# Patient Record
Sex: Male | Born: 1940
Health system: Southern US, Community
[De-identification: ages and names within clinical notes are randomized; demographics above are authoritative.]

## PROBLEM LIST (undated history)

## (undated) DIAGNOSIS — E039 Hypothyroidism, unspecified: Secondary | ICD-10-CM

## (undated) DIAGNOSIS — K222 Esophageal obstruction: Secondary | ICD-10-CM

## (undated) DIAGNOSIS — R58 Hemorrhage, not elsewhere classified: Secondary | ICD-10-CM

## (undated) DIAGNOSIS — K649 Unspecified hemorrhoids: Secondary | ICD-10-CM

## (undated) DIAGNOSIS — I4510 Unspecified right bundle-branch block: Secondary | ICD-10-CM

## (undated) DIAGNOSIS — F319 Bipolar disorder, unspecified: Secondary | ICD-10-CM

## (undated) DIAGNOSIS — J45909 Unspecified asthma, uncomplicated: Secondary | ICD-10-CM

## (undated) DIAGNOSIS — M47816 Spondylosis without myelopathy or radiculopathy, lumbar region: Secondary | ICD-10-CM

## (undated) DIAGNOSIS — E119 Type 2 diabetes mellitus without complications: Secondary | ICD-10-CM

## (undated) DIAGNOSIS — T7840XA Allergy, unspecified, initial encounter: Secondary | ICD-10-CM

## (undated) DIAGNOSIS — Z86010 Personal history of colonic polyps: Secondary | ICD-10-CM

## (undated) DIAGNOSIS — M169 Osteoarthritis of hip, unspecified: Secondary | ICD-10-CM

## (undated) DIAGNOSIS — Z87442 Personal history of urinary calculi: Secondary | ICD-10-CM

## (undated) DIAGNOSIS — Z5189 Encounter for other specified aftercare: Secondary | ICD-10-CM

## (undated) DIAGNOSIS — G4730 Sleep apnea, unspecified: Secondary | ICD-10-CM

## (undated) DIAGNOSIS — G4733 Obstructive sleep apnea (adult) (pediatric): Secondary | ICD-10-CM

## (undated) DIAGNOSIS — I639 Cerebral infarction, unspecified: Secondary | ICD-10-CM

## (undated) DIAGNOSIS — I10 Essential (primary) hypertension: Secondary | ICD-10-CM

## (undated) DIAGNOSIS — K219 Gastro-esophageal reflux disease without esophagitis: Secondary | ICD-10-CM

## (undated) DIAGNOSIS — M1990 Unspecified osteoarthritis, unspecified site: Secondary | ICD-10-CM

## (undated) DIAGNOSIS — E785 Hyperlipidemia, unspecified: Secondary | ICD-10-CM

## (undated) DIAGNOSIS — J309 Allergic rhinitis, unspecified: Secondary | ICD-10-CM

## (undated) HISTORY — DX: Essential (primary) hypertension: I10

## (undated) HISTORY — DX: Hyperlipidemia, unspecified: E78.5

## (undated) HISTORY — PX: COLONOSCOPY: SHX174

## (undated) HISTORY — DX: Sleep apnea, unspecified: G47.30

## (undated) HISTORY — PX: HERNIA REPAIR: SHX51

## (undated) HISTORY — DX: Gastro-esophageal reflux disease without esophagitis: K21.9

## (undated) HISTORY — PX: JOINT REPLACEMENT: SHX530

## (undated) HISTORY — DX: Allergic rhinitis, unspecified: J30.9

## (undated) HISTORY — DX: Hypothyroidism, unspecified: E03.9

## (undated) HISTORY — PX: TONSILLECTOMY: SUR1361

## (undated) HISTORY — DX: Unspecified asthma, uncomplicated: J45.909

## (undated) HISTORY — DX: Type 2 diabetes mellitus without complications: E11.9

## (undated) HISTORY — DX: Unspecified right bundle-branch block: I45.10

## (undated) HISTORY — PX: UMBILICAL HERNIA REPAIR: SHX196

## (undated) HISTORY — PX: OTHER SURGICAL HISTORY: SHX169

## (undated) HISTORY — DX: Encounter for other specified aftercare: Z51.89

## (undated) HISTORY — DX: Obstructive sleep apnea (adult) (pediatric): G47.33

## (undated) HISTORY — PX: UPPER GASTROINTESTINAL ENDOSCOPY: SHX188

## (undated) HISTORY — DX: Bipolar disorder, unspecified: F31.9

## (undated) HISTORY — DX: Unspecified osteoarthritis, unspecified site: M19.90

## (undated) HISTORY — DX: Personal history of urinary calculi: Z87.442

## (undated) HISTORY — DX: Allergy, unspecified, initial encounter: T78.40XA

## (undated) HISTORY — DX: Osteoarthritis of hip, unspecified: M16.9

## (undated) HISTORY — DX: Personal history of colonic polyps: Z86.010

## (undated) HISTORY — PX: TONSILLECTOMY: SHX5217

## (undated) HISTORY — PX: GANGLION CYST EXCISION: SHX1691

## (undated) HISTORY — DX: Esophageal obstruction: K22.2

## (undated) HISTORY — DX: Spondylosis without myelopathy or radiculopathy, lumbar region: M47.816

---

## 1998-09-13 ENCOUNTER — Inpatient Hospital Stay (HOSPITAL_COMMUNITY): Admission: EM | Admit: 1998-09-13 | Discharge: 1998-09-21 | Payer: Self-pay | Admitting: *Deleted

## 2001-10-04 ENCOUNTER — Emergency Department (HOSPITAL_COMMUNITY): Admission: EM | Admit: 2001-10-04 | Discharge: 2001-10-04 | Payer: Self-pay | Admitting: Emergency Medicine

## 2003-03-05 ENCOUNTER — Emergency Department (HOSPITAL_COMMUNITY): Admission: EM | Admit: 2003-03-05 | Discharge: 2003-03-05 | Payer: Self-pay | Admitting: Emergency Medicine

## 2003-03-05 ENCOUNTER — Encounter: Payer: Self-pay | Admitting: Emergency Medicine

## 2003-03-30 ENCOUNTER — Encounter: Admission: RE | Admit: 2003-03-30 | Discharge: 2003-03-30 | Payer: Self-pay | Admitting: Urology

## 2003-03-30 ENCOUNTER — Ambulatory Visit (HOSPITAL_COMMUNITY): Admission: RE | Admit: 2003-03-30 | Discharge: 2003-03-30 | Payer: Self-pay | Admitting: Urology

## 2003-09-10 ENCOUNTER — Emergency Department (HOSPITAL_COMMUNITY): Admission: EM | Admit: 2003-09-10 | Discharge: 2003-09-11 | Payer: Self-pay | Admitting: Emergency Medicine

## 2003-10-10 ENCOUNTER — Encounter: Admission: RE | Admit: 2003-10-10 | Discharge: 2003-10-10 | Payer: Self-pay | Admitting: Orthopedic Surgery

## 2003-12-21 ENCOUNTER — Encounter (INDEPENDENT_AMBULATORY_CARE_PROVIDER_SITE_OTHER): Payer: Self-pay | Admitting: Specialist

## 2003-12-21 ENCOUNTER — Ambulatory Visit (HOSPITAL_COMMUNITY): Admission: RE | Admit: 2003-12-21 | Discharge: 2003-12-21 | Payer: Self-pay | Admitting: *Deleted

## 2004-06-21 ENCOUNTER — Ambulatory Visit: Payer: Self-pay | Admitting: Gastroenterology

## 2004-07-23 ENCOUNTER — Ambulatory Visit: Payer: Self-pay | Admitting: Internal Medicine

## 2004-07-30 ENCOUNTER — Ambulatory Visit: Payer: Self-pay | Admitting: Gastroenterology

## 2004-12-11 ENCOUNTER — Ambulatory Visit: Payer: Self-pay | Admitting: Internal Medicine

## 2005-02-17 ENCOUNTER — Inpatient Hospital Stay (HOSPITAL_COMMUNITY): Admission: EM | Admit: 2005-02-17 | Discharge: 2005-02-19 | Payer: Self-pay | Admitting: Emergency Medicine

## 2005-02-17 ENCOUNTER — Ambulatory Visit: Payer: Self-pay | Admitting: Internal Medicine

## 2005-02-25 ENCOUNTER — Ambulatory Visit: Payer: Self-pay | Admitting: Internal Medicine

## 2005-03-04 ENCOUNTER — Ambulatory Visit: Payer: Self-pay | Admitting: Internal Medicine

## 2005-05-07 ENCOUNTER — Ambulatory Visit: Payer: Self-pay | Admitting: Internal Medicine

## 2005-08-22 ENCOUNTER — Ambulatory Visit: Payer: Self-pay | Admitting: Internal Medicine

## 2005-12-23 ENCOUNTER — Ambulatory Visit: Payer: Self-pay | Admitting: Internal Medicine

## 2006-04-18 ENCOUNTER — Ambulatory Visit: Payer: Self-pay | Admitting: Internal Medicine

## 2006-07-15 ENCOUNTER — Ambulatory Visit: Payer: Self-pay | Admitting: Internal Medicine

## 2007-11-25 ENCOUNTER — Telehealth: Payer: Self-pay | Admitting: Gastroenterology

## 2007-11-25 DIAGNOSIS — J45909 Unspecified asthma, uncomplicated: Secondary | ICD-10-CM

## 2007-11-25 DIAGNOSIS — J302 Other seasonal allergic rhinitis: Secondary | ICD-10-CM

## 2007-11-25 DIAGNOSIS — J309 Allergic rhinitis, unspecified: Secondary | ICD-10-CM

## 2007-11-25 DIAGNOSIS — J45998 Other asthma: Secondary | ICD-10-CM

## 2007-11-25 DIAGNOSIS — J3089 Other allergic rhinitis: Secondary | ICD-10-CM

## 2007-11-25 DIAGNOSIS — G4733 Obstructive sleep apnea (adult) (pediatric): Secondary | ICD-10-CM

## 2007-11-25 HISTORY — DX: Unspecified asthma, uncomplicated: J45.909

## 2007-11-25 HISTORY — DX: Allergic rhinitis, unspecified: J30.9

## 2007-11-25 HISTORY — DX: Obstructive sleep apnea (adult) (pediatric): G47.33

## 2007-11-26 ENCOUNTER — Ambulatory Visit: Payer: Self-pay | Admitting: Internal Medicine

## 2008-04-21 ENCOUNTER — Telehealth: Payer: Self-pay | Admitting: Internal Medicine

## 2008-04-26 ENCOUNTER — Ambulatory Visit: Payer: Self-pay | Admitting: Internal Medicine

## 2008-05-09 ENCOUNTER — Ambulatory Visit: Payer: Self-pay | Admitting: Internal Medicine

## 2008-05-10 DIAGNOSIS — E119 Type 2 diabetes mellitus without complications: Secondary | ICD-10-CM

## 2008-05-10 HISTORY — DX: Type 2 diabetes mellitus without complications: E11.9

## 2008-05-31 ENCOUNTER — Telehealth (INDEPENDENT_AMBULATORY_CARE_PROVIDER_SITE_OTHER): Payer: Self-pay | Admitting: *Deleted

## 2008-06-06 ENCOUNTER — Telehealth (INDEPENDENT_AMBULATORY_CARE_PROVIDER_SITE_OTHER): Payer: Self-pay | Admitting: *Deleted

## 2008-07-21 ENCOUNTER — Telehealth (INDEPENDENT_AMBULATORY_CARE_PROVIDER_SITE_OTHER): Payer: Self-pay | Admitting: *Deleted

## 2008-07-25 ENCOUNTER — Telehealth: Payer: Self-pay | Admitting: Internal Medicine

## 2008-07-25 ENCOUNTER — Encounter: Payer: Self-pay | Admitting: Internal Medicine

## 2008-07-26 ENCOUNTER — Encounter: Payer: Self-pay | Admitting: Internal Medicine

## 2009-04-24 ENCOUNTER — Ambulatory Visit: Payer: Self-pay | Admitting: Internal Medicine

## 2009-06-23 ENCOUNTER — Encounter: Payer: Self-pay | Admitting: Internal Medicine

## 2009-07-27 ENCOUNTER — Ambulatory Visit: Payer: Self-pay | Admitting: Internal Medicine

## 2009-07-27 ENCOUNTER — Telehealth: Payer: Self-pay | Admitting: Internal Medicine

## 2009-07-27 DIAGNOSIS — I10 Essential (primary) hypertension: Secondary | ICD-10-CM | POA: Insufficient documentation

## 2009-07-27 HISTORY — DX: Essential (primary) hypertension: I10

## 2009-08-29 ENCOUNTER — Ambulatory Visit: Payer: Self-pay | Admitting: Internal Medicine

## 2009-09-25 ENCOUNTER — Encounter: Payer: Self-pay | Admitting: Internal Medicine

## 2009-10-06 ENCOUNTER — Ambulatory Visit: Payer: Self-pay | Admitting: Internal Medicine

## 2009-10-06 DIAGNOSIS — K219 Gastro-esophageal reflux disease without esophagitis: Secondary | ICD-10-CM

## 2009-10-06 DIAGNOSIS — Z860100 Personal history of colon polyps, unspecified: Secondary | ICD-10-CM | POA: Insufficient documentation

## 2009-10-06 DIAGNOSIS — Z87442 Personal history of urinary calculi: Secondary | ICD-10-CM

## 2009-10-06 DIAGNOSIS — I10 Essential (primary) hypertension: Secondary | ICD-10-CM | POA: Insufficient documentation

## 2009-10-06 DIAGNOSIS — E785 Hyperlipidemia, unspecified: Secondary | ICD-10-CM

## 2009-10-06 DIAGNOSIS — E039 Hypothyroidism, unspecified: Secondary | ICD-10-CM

## 2009-10-06 DIAGNOSIS — F319 Bipolar disorder, unspecified: Secondary | ICD-10-CM

## 2009-10-06 HISTORY — DX: Bipolar disorder, unspecified: F31.9

## 2009-10-06 HISTORY — DX: Hypothyroidism, unspecified: E03.9

## 2009-10-06 HISTORY — DX: Personal history of colon polyps, unspecified: Z86.0100

## 2009-10-06 HISTORY — DX: Personal history of colonic polyps: Z86.010

## 2009-10-06 HISTORY — DX: Gastro-esophageal reflux disease without esophagitis: K21.9

## 2009-10-06 HISTORY — DX: Personal history of urinary calculi: Z87.442

## 2009-10-06 HISTORY — DX: Hyperlipidemia, unspecified: E78.5

## 2009-10-09 ENCOUNTER — Encounter: Payer: Self-pay | Admitting: Internal Medicine

## 2009-10-24 ENCOUNTER — Encounter: Payer: Self-pay | Admitting: Internal Medicine

## 2010-01-01 ENCOUNTER — Ambulatory Visit (HOSPITAL_BASED_OUTPATIENT_CLINIC_OR_DEPARTMENT_OTHER): Admission: RE | Admit: 2010-01-01 | Discharge: 2010-01-01 | Payer: Self-pay | Admitting: Orthopedic Surgery

## 2010-02-23 ENCOUNTER — Telehealth: Payer: Self-pay | Admitting: Internal Medicine

## 2010-03-02 ENCOUNTER — Encounter: Payer: Self-pay | Admitting: Internal Medicine

## 2010-03-03 ENCOUNTER — Emergency Department (HOSPITAL_COMMUNITY): Admission: EM | Admit: 2010-03-03 | Discharge: 2010-03-03 | Payer: Self-pay | Admitting: Emergency Medicine

## 2010-03-08 ENCOUNTER — Ambulatory Visit: Payer: Self-pay | Admitting: Internal Medicine

## 2010-03-08 ENCOUNTER — Telehealth: Payer: Self-pay | Admitting: Internal Medicine

## 2010-03-09 ENCOUNTER — Ambulatory Visit: Payer: Self-pay | Admitting: Internal Medicine

## 2010-04-24 ENCOUNTER — Ambulatory Visit: Payer: Self-pay | Admitting: Internal Medicine

## 2010-04-30 ENCOUNTER — Telehealth (INDEPENDENT_AMBULATORY_CARE_PROVIDER_SITE_OTHER): Payer: Self-pay | Admitting: *Deleted

## 2010-05-29 ENCOUNTER — Telehealth: Payer: Self-pay | Admitting: Internal Medicine

## 2010-06-08 ENCOUNTER — Ambulatory Visit: Admit: 2010-06-08 | Payer: Self-pay | Admitting: Internal Medicine

## 2010-06-21 ENCOUNTER — Other Ambulatory Visit: Payer: Self-pay | Admitting: Internal Medicine

## 2010-06-21 ENCOUNTER — Ambulatory Visit
Admission: RE | Admit: 2010-06-21 | Discharge: 2010-06-21 | Payer: Self-pay | Source: Home / Self Care | Attending: Internal Medicine | Admitting: Internal Medicine

## 2010-06-21 LAB — HEPATIC FUNCTION PANEL
ALT: 52 U/L (ref 0–53)
AST: 42 U/L — ABNORMAL HIGH (ref 0–37)
Albumin: 4 g/dL (ref 3.5–5.2)
Alkaline Phosphatase: 58 U/L (ref 39–117)
Bilirubin, Direct: 0.1 mg/dL (ref 0.0–0.3)
Total Bilirubin: 0.6 mg/dL (ref 0.3–1.2)
Total Protein: 7.1 g/dL (ref 6.0–8.3)

## 2010-06-21 LAB — BASIC METABOLIC PANEL
BUN: 23 mg/dL (ref 6–23)
CO2: 27 mEq/L (ref 19–32)
Calcium: 8.9 mg/dL (ref 8.4–10.5)
Chloride: 100 mEq/L (ref 96–112)
Creatinine, Ser: 1.2 mg/dL (ref 0.4–1.5)
GFR: 63.54 mL/min (ref >60.00)
Glucose, Bld: 146 mg/dL — ABNORMAL HIGH (ref 70–99)
Potassium: 4.8 mEq/L (ref 3.5–5.1)
Sodium: 136 mEq/L (ref 135–145)

## 2010-06-21 LAB — CBC WITH DIFFERENTIAL/PLATELET
Basophils Absolute: 0 10*3/uL (ref 0.0–0.1)
Basophils Relative: 0.5 % (ref 0.0–3.0)
Eosinophils Absolute: 0.3 10*3/uL (ref 0.0–0.7)
Eosinophils Relative: 4.5 % (ref 0.0–5.0)
HCT: 40.9 % (ref 39.0–52.0)
Hemoglobin: 14.1 g/dL (ref 13.0–17.0)
Lymphocytes Relative: 25 % (ref 12.0–46.0)
Lymphs Abs: 1.5 10*3/uL (ref 0.7–4.0)
MCHC: 34.3 g/dL (ref 30.0–36.0)
MCV: 92.2 fl (ref 78.0–100.0)
Monocytes Absolute: 0.6 10*3/uL (ref 0.1–1.0)
Monocytes Relative: 9.1 % (ref 3.0–12.0)
Neutro Abs: 3.7 10*3/uL (ref 1.4–7.7)
Neutrophils Relative %: 60.9 % (ref 43.0–77.0)
Platelets: 174 10*3/uL (ref 150.0–400.0)
RBC: 4.44 Mil/uL (ref 4.22–5.81)
RDW: 13.5 % (ref 11.5–14.6)
WBC: 6.1 10*3/uL (ref 4.5–10.5)

## 2010-06-21 LAB — URINALYSIS
Bilirubin Urine: NEGATIVE
Hemoglobin, Urine: NEGATIVE
Ketones, ur: NEGATIVE
Leukocytes, UA: NEGATIVE
Nitrite: NEGATIVE
Specific Gravity, Urine: 1.025 (ref 1.000–1.030)
Total Protein, Urine: NEGATIVE
Urine Glucose: NEGATIVE
Urobilinogen, UA: 0.2 (ref 0.0–1.0)
pH: 6 (ref 5.0–8.0)

## 2010-06-21 LAB — LDL CHOLESTEROL, DIRECT: Direct LDL: 142.3 mg/dL

## 2010-06-21 LAB — LIPID PANEL
Cholesterol: 218 mg/dL — ABNORMAL HIGH (ref 0–200)
HDL: 52.5 mg/dL (ref >39.00)
Total CHOL/HDL Ratio: 4
Triglycerides: 198 mg/dL — ABNORMAL HIGH (ref 0.0–149.0)
VLDL: 39.6 mg/dL (ref 0.0–40.0)

## 2010-06-21 LAB — TSH: TSH: 3.51 u[IU]/mL (ref 0.35–5.50)

## 2010-06-21 LAB — HEMOGLOBIN A1C: Hgb A1c MFr Bld: 7.2 % — ABNORMAL HIGH (ref 4.6–6.5)

## 2010-06-28 ENCOUNTER — Other Ambulatory Visit: Payer: Self-pay | Admitting: Internal Medicine

## 2010-06-28 ENCOUNTER — Encounter: Payer: Self-pay | Admitting: Internal Medicine

## 2010-06-28 ENCOUNTER — Ambulatory Visit
Admission: RE | Admit: 2010-06-28 | Discharge: 2010-06-28 | Payer: Self-pay | Source: Home / Self Care | Attending: Internal Medicine | Admitting: Internal Medicine

## 2010-06-28 LAB — PSA: PSA: 0.3 ng/mL (ref 0.10–4.00)

## 2010-06-29 LAB — CONVERTED CEMR LAB: Carbamazepine Lvl: 6.6 ug/mL (ref 4.0–12.0)

## 2010-07-03 NOTE — Progress Notes (Signed)
Summary: lab work  Phone Note Call from Patient   Caller: Patient Reason for Call: Talk to Nurse Summary of Call: pt insurance is requesting he have lab work done, A1C Initial call taken by: Darnell Level,  March 08, 2010 2:00 PM  Follow-up for Phone Call        ok for a1c - 250.02  please make Physcial for jan 2012 with CPX labs then Follow-up by: Biagio Borg MD,  March 08, 2010 3:02 PM  Additional Follow-up for Phone Call Additional follow up Details #1::        A1C scheduled, pt informed of labs and appt Additional Follow-up by: Crissie Sickles, [place],  March 08, 2010 3:25 PM

## 2010-07-03 NOTE — Assessment & Plan Note (Signed)
Summary: 1 month/ mbw   Primary Provider/Referring Provider:  Art Nyoka Cowden  CC:  68month follow up.  states breathing is doing "wonderful."  denies wheezing, chest tightness and SOB.  States cough has resolved.  Requesting rx for Benicar 3 mo supply.  , and Hypertension Management.  History of Present Illness: 05-19-09- asthma, Rhinitis, OSA Cold air makes him hoarse and increases his mucus but he doesn't get "tight" so he asks when/ whether to use rescue inhaler. Occasionally he wakes stridorous if he has a cold. He wears CPAP and isn't sure this has happened with cpap on. Does a saline nasal rinse. He also inhales over salt crystals- these help Had Flu vax Uses Qvar and we discussed cold air as a trigger.  July 27, 2009 Acute visit.  Pt c/o prod with grey sputum x 3 to 5 wks. better after 1 week of previous abx rx.    He also c/o slight increase in SOB.  He has had fever x 3-4 days.   Admits not following rx, not using qvar at all. continues to get tight strangling sensation with ex to cold now with severe coughing fits to point where looses breath.  also bad heartburn.  Pt denies any significant sore throat,   chills, sweats, unintended wt loss, pleuritic or exertional cp, orthopnea pnd or leg swelling.    August 29, 2009- Asthma, rhinitis, OSA Significant improvement- much less head congestion- he credits Benicar. Has been off prednisone but "feels like Im still on them", meaning he has had much less nasal congestion. He credits Benicar with BP lowering, and asks permission to stop HCTZ which he knows might sensitize to sunburn. In particular, with change from Altace to Benicar he can lie down without nasal congestion.     Hypertension History:      Positive major cardiovascular risk factors include male age 68 years old or older, diabetes, and hypertension.  Negative major cardiovascular risk factors include non-tobacco-user status.    Current Medications (verified): 1)   Flonase 50 Mcg/act  Susp (Fluticasone Propionate) .Marland Kitchen.. 1 Spray Each Nostril Once Daily 2)  Synthroid 50 Mcg  Tabs (Levothyroxine Sodium) .... Take 1 Tablet By Mouth Once A Day 3)  Hydrochlorothiazide 25 Mg  Tabs (Hydrochlorothiazide) .... Take 1 Tablet By Mouth Once A Day 4)  Protonix 40 Mg  Tbec (Pantoprazole Sodium) .... Take  One 30-60 Min Before First Meal of The Day 5)  Cpap Set At 9 .... [place], [place] 6)  Mucinex Dm 30-600 Mg Xr12h-Tab (Dextromethorphan-Guaifenesin) .... Take As Directed 7)  Proair Hfa 108 (90 Base) Mcg/act Aers (Albuterol Sulfate) .... 2 Puffs Four Times A Day As Needed 8)  Tegretol 200 Mg Tabs (Carbamazepine) .... Take 2po At Bedtime 9)  Benicar 20 Mg Tabs (Olmesartan Medoxomil) .... One A Day  Allergies (verified): 1)  ! Aciphex 2)  ! Ceftin 3)  ! * Cefzil  Past History:  Past Medical History: Last updated: 07/27/2009 OBSTRUCTIVE SLEEP APNEA (ICD-327.23) ALLERGIC RHINITIS (ICD-477.9) ALLERGIC ASTHMA (ICD-493.00) Diabetes, Type 2 Hypertension    - D/C Ace July 27, 2009   Family History: Last updated: 2009/05/19 Father- died- apnea at nursing Home Mother- died myelofibrosis after throat cancer  Social History: Last updated: 2009-05-19 Married Retired Engineer, 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 06/23/77  Risk Factors: Smoking Status: quit (05/19/09)  Past Surgical History: hernia repairs ganglin cyst goiter- needed tracheoswtomy for bleeding Tonsillectomy  Review of Systems  See HPI  The patient denies anorexia, fever, weight loss, weight gain, vision loss, decreased hearing, hoarseness, chest pain, syncope, dyspnea on exertion, peripheral edema, prolonged cough, headaches, hemoptysis, and severe indigestion/heartburn.    Vital Signs:  Patient profile:   70 year old male Height:      71.5 inches Weight:      257 pounds BMI:     35.47 BP sitting:   108 / 66  (left arm) Cuff size:    large  Vitals Entered By: Raymondo Band RN (August 29, 2009 4:03 PM)  O2 Flow:  Room air CC: 68month follow up.  states breathing is doing "wonderful."  denies wheezing, chest tightness and SOB.  States cough has resolved.  Requesting rx for Benicar 3 mo supply.  , Hypertension Management Comments Medications reviewed with patient Daytime contact number verified with patient. Raymondo Band RN  August 29, 2009 4:02 PM    Physical Exam  Additional Exam:  General: A/Ox3; very anxious, very hoarse obese wm  NAD  SKIN: no rash, lesions NODES: no lymphadenopathy HEENT: [place]/AT, EOM- WNL, Conjuctivae- clear, PERRLA, TM-WNL, Nose- clear, Throat- clear and wnl, Mallampati III, mild hoarse without stridor. NECK: Supple w/ fair ROM, JVD- none, normal carotid impulses w/o bruits Thyroid-  CHEST:V ery clear to P&A  HEART: RRR, no m/g/r heard ABDOMEN: Soft and nl;  [id number], nl pulses, no edema  NEURO: Grossly intact to observation      Impression & Recommendations:  Problem # 1:  ALLERGIC RHINITIS (ICD-477.9)  He is well pleased with decreased nasal congestion and asks to continue Benicar on that basis. Since BP today is 108/66, there is also room to respect his photosensitization concern by letting him try using HCTZ just as needed for fluid retention. His updated medication list for this problem includes:    Flonase 50 Mcg/act Susp (Fluticasone propionate) .Marland Kitchen... 1 spray each nostril once daily  Problem # 2:  ALLERGIC ASTHMA (ICD-493.00) Good control. Medications reviewed.  Medications Added to Medication List This Visit: 1)  Zithromax Z-pak 250 Mg Tabs (Azithromycin) .... 2 today then one daily  Other Orders: Est. Patient Level III [id number]) Primary Care Referral (Primary)  Hypertension Assessment/Plan:      The patient's hypertensive risk group is category C: Target organ damage and/or diabetes.  Today's blood pressure is 108/66.     Patient Instructions: 1)  Please schedule a  follow-up appointment in 1 year. 2)  Scripts for [employer], Proair and Z pak. 3)  suggest you get BP checked from time to time. Take the HCTZ if BP starts to creep up or you can tell that you are retaining fluid. 4)  See [place] to set up primary care referral Prescriptions: ZITHROMAX Z-PAK 250 MG TABS (AZITHROMYCIN) 2 today then one daily  #1 pak x 3   Entered and Authorized by:   Deneise Lever MD   Signed by:   Deneise Lever MD on 08/29/2009   Method used:   Print then Give to Patient   RxID:   [id number] PROAIR HFA 108 (90 BASE) MCG/ACT AERS (ALBUTEROL SULFATE) 2 puffs four times a day as needed  #1 x prn   Entered and Authorized by:   Deneise Lever MD   Signed by:   Deneise Lever MD on 08/29/2009   Method used:   Print then Give to Patient   RxID:   [id number] BENICAR 20 MG TABS (OLMESARTAN MEDOXOMIL) one a day  #90 x 3  Entered and Authorized by:   Deneise Lever MD   Signed by:   Deneise Lever MD on 08/29/2009   Method used:   Print then Give to Patient   RxID:   [id number]    Immunization History:  Influenza Immunization History:    Influenza:  historical (03/03/2009)

## 2010-07-03 NOTE — Assessment & Plan Note (Signed)
Summary: FLU SHOT/NWS  Nurse Visit   Allergies: 1)  ! Aciphex 2)  ! Ceftin 3)  ! * Cefzil 4)  * Pneumovax  Orders Added: 1)  Flu Vaccine 46yrs + MEDICARE PATIENTS [Q2039] 2)  Administration Flu vaccine - MCR [id number] Flu Vaccine Consent Questions     Do you have a history of severe allergic reactions to this vaccine? no    Any prior history of allergic reactions to egg and/or gelatin? no    Do you have a sensitivity to the preservative Thimersol? no    Do you have a past history of Guillan-Barre Syndrome? no    Do you currently have an acute febrile illness? no    Have you ever had a severe reaction to latex? no    Vaccine information given and explained to patient? yes    Are you currently pregnant? no    Lot Number:[id number]   Exp Date:12/01/2010   Site Given  Left Deltoid IM

## 2010-07-03 NOTE — Letter (Signed)
Summary: [facility]   Imported By: Phillis Knack 03/06/2010 10:23:08  _____________________________________________________________________  External Attachment:    Type:   Image     Comment:   External Document

## 2010-07-03 NOTE — Letter (Signed)
Summary: [place]   Imported By: Phillis Knack 11/09/2009 13:16:13  _____________________________________________________________________  External Attachment:    Type:   Image     Comment:   External Document

## 2010-07-03 NOTE — Progress Notes (Signed)
Summary: sick > doxy rx  Phone Note Call from Patient Call back at Home Phone [phone]   Caller: Patient Call For: young Reason for Call: Acute Illness, Talk to Nurse Summary of Call: Patient calling saying he has bad cough brown sputum, nose stopped up with thick green mucus.  Maggie Font. Initial call taken by: Mateo Flow,  April 30, 2010 8:45 AM  Follow-up for Phone Call        The pt c/o cough with brownish mucus and nasal mucus is yellow to green since last Wed., 04/25/2010. He also c/o a slight sore throat. He denies any SOB or wheezing or fever. Pls advise. Allergies (verified):  1)  ! Aciphex 2)  ! Ceftin 3)  ! * Cefzil 4)  * Pneumovax Follow-up by: Francesca Jewett CMA,  April 30, 2010 9:52 AM  Additional Follow-up for Phone Call Additional follow up Details #1::        Per CDY-okay to give patient Doxycycline 100mg  #8 take 2 today then 1 daily until gone. no refills.Clayborne Dana CMA  April 30, 2010 10:54 AM .  Hulen Skains, spoke with pt.  He is aware of above recs per CY and rx sent to [facility].  He verbalized understanding and will call back if sxs do not improve or worsen.  Additional Follow-up by: Raymondo Band RN,  April 30, 2010 11:08 AM    New/Updated Medications: DOXYCYCLINE HYCLATE 100 MG CAPS (DOXYCYCLINE HYCLATE) take 2 today then 1 daily until gone Prescriptions: DOXYCYCLINE HYCLATE 100 MG CAPS (DOXYCYCLINE HYCLATE) take 2 today then 1 daily until gone  #8 x 0   Entered by:   Raymondo Band RN   Authorized by:   Deneise Lever MD   Signed by:   Raymondo Band RN on 04/30/2010   Method used:   Electronically to        [facility] (retail)       [address], [place]  [id number]       Ph: [phone] or [phone]       Fax: [phone]   RxID:   [id number]

## 2010-07-03 NOTE — Progress Notes (Signed)
  Phone Note Refill Request Message from:  Fax from Pharmacy on February 23, 2010 10:36 AM  Refills Requested: Medication #1:  HYDROCHLOROTHIAZIDE 25 MG  TABS Take 1 tablet by mouth once a day   Dosage confirmed as above?Dosage Confirmed   Notes: Scherrie November Initial call taken by: Shirlean Mylar Ewing CMA Deborra Medina),  February 23, 2010 10:36 AM    Prescriptions: HYDROCHLOROTHIAZIDE 25 MG  TABS (HYDROCHLOROTHIAZIDE) Take 1 tablet by mouth once a day  #30 x 8   Entered by:   Sharon Seller CMA ([place])   Authorized by:   Biagio Borg MD   Signed by:   Sharon Seller CMA ([place]) on 02/23/2010   Method used:   Faxed to ...       [facility] (retail)       [address], [place]  [id number]       Ph: [phone] or [phone]       Fax: [phone]   RxID:   [id number]

## 2010-07-03 NOTE — Assessment & Plan Note (Signed)
Summary: 12 months/apc   Primary [place]/Referring Maurico Perrell:  Art Nyoka Cowden  CC:  Yearly follow up visit-allergies and asthma-Increased mucus in throat x 1 week.Marland Kitchen  History of Present Illness: July 27, 2009 Acute visit.  Pt c/o prod with grey sputum x 3 to 5 wks. better after 1 week of previous abx rx.    He also c/o slight increase in SOB.  He has had fever x 3-4 days.   Admits not following rx, not using qvar at all. continues to get tight strangling sensation with ex to cold now with severe coughing fits to point where looses breath.  also bad heartburn.  Pt denies any significant sore throat,  chills, sweats, unintended wt loss, pleuritic or exertional cp, orthopnea pnd or leg swelling.    August 29, 2009- Asthma, rhinitis, OSA Significant improvement- much less head congestion- he credits Benicar. Has been off prednisone but "feels like Im still on them", meaning he has had much less nasal congestion. He credits Benicar with BP lowering, and asks permission to stop HCTZ which he knows might sensitize to sunburn. In particular, with change from Altace to Benicar he can lie down without nasal congestion.  April 24, 2010- Asthma, rhinitis, OSA, ACEI sensitive Nurse-CC: Yearly follow up visit-allergies and asthma-Increased mucus in throat x 1 week. Exercises at [employer]- came out wet, got chilled and got a cold recently- shared w/ several in family. He feels he is shaking this off.  CPAP still used every night at 9 cwp. Discussed medical consequences of sleep apnea.  Discussed persistent improvement in nasal airway as he remains off Altace.  Discussed smoking hx- will get latest CXR report for out records. Denies routine cough, wheeze or SOB.  Hasn't needed rescue inhaler or Advair since Spring. Only occasionally hears a wheeze.     Asthma History    Initial Asthma Severity Rating:    Age range: 12+ years    Symptoms: 0-2 days/week    Nighttime Awakenings: 0-2/month    Interferes w/ normal  activity: no limitations    SABA use (not for EIB): 0-2 days/week    Asthma Severity Assessment: Intermittent   Preventive Screening-Counseling & Management  Alcohol-Tobacco     Smoking Status: quit     Packs/Day: 1.0     Year Started: age 82     Year Quit: 1979  Current Medications (verified): 1)  Flonase 50 Mcg/act  Susp (Fluticasone Propionate) .Marland Kitchen.. 1 Spray Each Nostril Once Daily 2)  Levothyroxine Sodium 100 Mcg Tabs (Levothyroxine Sodium) .... Take 1 By Mouth Once Daily 3)  Hydrochlorothiazide 25 Mg  Tabs (Hydrochlorothiazide) .... Take 1 Tablet By Mouth Once A Day 4)  Protonix 40 Mg  Tbec (Pantoprazole Sodium) .... Take  One 30-60 Min Before First Meal of The Day 5)  Cpap Set At 9 .... [place], [place] 6)  Mucinex Dm 30-600 Mg Xr12h-Tab (Dextromethorphan-Guaifenesin) .... Take As Directed 7)  Proair Hfa 108 (90 Base) Mcg/act Aers (Albuterol Sulfate) .... 2 Puffs Four Times A Day As Needed 8)  Tegretol 200 Mg Tabs (Carbamazepine) .... Take 2po At Bedtime 9)  Benicar 20 Mg Tabs (Olmesartan Medoxomil) .... One A Day 10)  Zithromax Z-Pak 250 Mg Tabs (Azithromycin) .... 2 Today Then One Daily  Allergies (verified): 1)  ! Aciphex 2)  ! Ceftin 3)  ! * Cefzil 4)  * Pneumovax  Past History:  Past Medical History: Last updated: 10/06/2009 OBSTRUCTIVE SLEEP APNEA (ICD-327.23) ALLERGIC RHINITIS (ICD-477.9) ALLERGIC ASTHMA (  ICD-493.00) Diabetes, Type 2 Hypertension    - D/C Ace July 27, 2009  GERD DJD Hyperlipidemia Colonic polyps, hx of - hyperplastic july 2005/ upper endoscopy 2005 neg for barretts Hypothyroidism Nephrolithiasis, hx of Bipolar disorder  Past Surgical History: Last updated: Oct 25, 2009 hernia repairs ganglion cyst goiter- needed tracheoswtomy for bleeding Tonsillectomy  Family History: Last updated: 10-25-2009 Father- died- apnea at [place] Mother- died myelofibrosis after throat cancer several with  ETOH  Social History: Last updated: 10-25-2009 Married Retired Engineer, 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 06/23/77 3 children Former Smoker Alcohol use-no Drug use-no  Risk Factors: Smoking Status: quit (04/24/2010) Packs/Day: 1.0 (04/24/2010)  Social History: Packs/Day:  1.0  Review of Systems      See HPI  The patient denies shortness of breath with activity, shortness of breath at rest, productive cough, non-productive cough, coughing up blood, chest pain, irregular heartbeats, acid heartburn, indigestion, loss of appetite, weight change, abdominal pain, difficulty swallowing, sore throat, tooth/dental problems, headaches, nasal congestion/difficulty breathing through nose, sneezing, itching, ear ache, rash, change in color of mucus, and fever.    Vital Signs:  Patient profile:   70 year old male Height:      70.5 inches Weight:      206.13 pounds BMI:     29.26 O2 Sat:      96 % on Room air Pulse rate:   64 / minute BP sitting:   134 / 72  (left arm) Cuff size:   regular  Vitals Entered By: Clayborne Dana CMA (April 24, 2010 8:57 AM)  O2 Flow:  Room air CC: Yearly follow up visit-allergies and asthma-Increased mucus in throat x 1 week.   Physical Exam  Additional Exam:  General: A/Ox3; very anxious, very hoarse obese wm  NAD  SKIN: no rash, lesions NODES: no lymphadenopathy HEENT: [place]/AT, EOM- WNL, Conjuctivae- clear, PERRLA, TM-WNL, Nose- clear, Throat- clear and wnl, Mallampati III, mild hoarse without stridor. NECK: Supple w/ fair ROM, JVD- none, normal carotid impulses w/o bruits Thyroid-  CHEST:Very clear to P&A - no wheeze HEART: RRR, no m/g/r heard ABDOMEN: Soft and nl;  [id number], nl pulses, no edema  NEURO: Grossly intact to observation      Impression & Recommendations:  Problem # 1:  OBSTRUCTIVE SLEEP APNEA (ICD-327.23)  Good compliance and control on CPAP. Updated his education.  Problem # 2:  ALLERGIC RHINITIS (ICD-477.9)  He  uses his flonase to control nasal congestion he recognizes as related to his pet Firefighter. His updated medication list for this problem includes:    Flonase 50 Mcg/act Susp (Fluticasone propionate) .Marland Kitchen... 1 spray each nostril once daily  Problem # 3:  ALLERGIC ASTHMA (ICD-493.00) He only needs very occasional rescue inhaler use. We discussed his pet birds.   Medications Added to Medication List This Visit: 1)  Levothyroxine Sodium 100 Mcg Tabs (Levothyroxine sodium) .... Take 1 by mouth once daily 2)  Advair Diskus 250-50 Mcg/dose Aepb (Fluticasone-salmeterol) .Marland Kitchen.. 1 puff and rinse mouth well, twice daily- maintenance inhaler  Other Orders: Est. Patient Level IV [id number])  Patient Instructions: 1)  Please schedule a follow-up appointment in 1 year. 2)  Continue CPAP at 9 3)  Use your rescue inhaler Proair as needed. Let us know if you find you are needing it regularly.  4)  We will ask Dr Art Rolly [place]ter office for report of your last CXR just for our records.  Prescriptions: PROAIR HFA 108 (90 BASE) MCG/ACT AERS (ALBUTEROL SULFATE) 2 puffs four  times a day as needed  #1 x prn   Entered and Authorized by:   Deneise Lever MD   Signed by:   Deneise Lever MD on 04/24/2010   Method used:   Print then Give to Patient   RxID:   [id number] ADVAIR DISKUS 250-50 MCG/DOSE AEPB (FLUTICASONE-SALMETEROL) 1 puff and rinse mouth well, twice daily- maintenance inhaler  #1 x prn   Entered and Authorized by:   Deneise Lever MD   Signed by:   Deneise Lever MD on 04/24/2010   Method used:   Print then Give to Patient   RxID:   [id number] PROAIR HFA 108 (90 BASE) MCG/ACT AERS (ALBUTEROL SULFATE) 2 puffs four times a day as needed  #1 x prn   Entered and Authorized by:   Deneise Lever MD   Signed by:   Deneise Lever MD on 04/24/2010   Method used:   Electronically to        [facility] (retail)       [address], [place]  [id number]       Ph:  [phone] or [phone]       Fax: [phone]   RxID:   [id number] FLONASE 50 MCG/ACT  SUSP (FLUTICASONE PROPIONATE) 1 SPRAY EACH NOSTRIL once daily  #1 x prn   Entered and Authorized by:   Deneise Lever MD   Signed by:   Deneise Lever MD on 04/24/2010   Method used:   Print then Give to Patient   RxID:   [id number]

## 2010-07-03 NOTE — Letter (Signed)
Summary: Screening/[facility]   Imported By: Phillis Knack 10/09/2009 13:53:36  _____________________________________________________________________  External Attachment:    Type:   Image     Comment:   External Document

## 2010-07-03 NOTE — Assessment & Plan Note (Signed)
Summary: Pulmonary/ acute ext ov/ stop altace   Primary Provider/Referring Provider:  Art Nyoka Cowden  CC:  Acute visit.  Pt c/o prod with grey sputum x 3 to 5 wks.  He also c/o slight increase in SOB.  He has had fever x 3-4 days.  Marland Kitchen  History of Present Illness: 5 yowm quit smoking 1979  11/26/07  ov follow-up of allergic rhinitis, asthma, and also sleep apnea.  He says his primary physician had a sleep study done and referred him to a neurologist  in [place] for  CPAP, which he thinks is set at 4. he stopped allergy vaccine in a couple of years ago.  Today he comes in complaining of 3 days of sore throat, tickling, postnasal drainage, and progressive head and chest congestion.  His nose is running. He asks for Tamiflu.  .   04/26/08-Asthma, Rhinitis, OSA Had sneeze, cough with grey sputum, some sweats no fever.. We called Zpak on 11/19. squeeze bottle saline nasal rinse. Continues cpap for osa.  April 24, 2009- asthma, Rhinitis, OSA Cold air makes him hoarse and increases his mucus but he doesn't get "tight" so he asks when/ whether to use rescue inhaler. Occasionally he wakes stridorous if he has a cold. He wears CPAP and isn't sure this has happened with cpap on. Does a saline nasal rinse. He also inhales over salt crystals- these help Had Flu vax Uses Qvar and we discussed cold air as a trigger.  July 27, 2009 Acute visit.  Pt c/o prod with grey sputum x 3 to 5 wks. better after 1 week of previous abx rx.    He also c/o slight increase in SOB.  He has had fever x 3-4 days.   admits not following rx, not using qvar at all. continues to get tight strangling sensation with ex to cold now with severe coughing fits to point where looses breath.  also bad heartburn.  Pt denies any significant sore throat,   chills, sweats, unintended wt loss, pleuritic or exertional cp, orthopnea pnd or leg swelling.     Current Medications (verified): 1)  Flonase 50 Mcg/act  Susp (Fluticasone  Propionate) .Marland Kitchen.. 1 Spray Each Nostril Once Daily 2)  Synthroid 50 Mcg  Tabs (Levothyroxine Sodium) .... Take 1 Tablet By Mouth Once A Day 3)  Hydrochlorothiazide 25 Mg  Tabs (Hydrochlorothiazide) .... Take 1 Tablet By Mouth Once A Day 4)  Protonix 40 Mg  Tbec (Pantoprazole Sodium) .... Take 1 Tablet By Mouth Once A Day 5)  Cpap Set At 9 .... [place], [place] 6)  Mucinex Dm 30-600 Mg Xr12h-Tab (Dextromethorphan-Guaifenesin) .... Take As Directed 7)  Altace 10 Mg Tabs (Ramipril) .... Take 1 By Mouth Once Daily 8)  Proair Hfa 108 (90 Base) Mcg/act Aers (Albuterol Sulfate) .... 2 Puffs Four Times A Day As Needed 9)  Tegretol 200 Mg Tabs (Carbamazepine) .... Take 2po At Bedtime 10)  Qvar 80 Mcg/act Aers (Beclomethasone Dipropionate) .... 2 Puffs Every Am  Allergies: 1)  ! Aciphex 2)  ! Ceftin 3)  ! * Cefzil  Past History:  Past Medical History: OBSTRUCTIVE SLEEP APNEA (ICD-327.23) ALLERGIC RHINITIS (ICD-477.9) ALLERGIC ASTHMA (ICD-493.00) Diabetes, Type 2 Hypertension    - D/C Ace July 27, 2009   Vital Signs:  Patient profile:   70 year old male Height:      70 inches Weight:      263.38 pounds BMI:     37.93 O2 Sat:  95 % on Room air Temp:     99.3 degrees F oral Pulse rate:   88 / minute BP sitting:   148 / 68  (left arm) Cuff size:   large  Vitals Entered By: Tilden Dome (July 27, 2009 2:28 PM)  O2 Flow:  Room air  Physical Exam  Additional Exam:  General: A/Ox3; very anxious, very hoarse obese wm  NAD  SKIN: no rash, lesions NODES: no lymphadenopathy HEENT: [place]/AT, EOM- WNL, Conjuctivae- clear, PERRLA, TM-WNL, Nose- clear, Throat- clear and wnl, Melampatti III, mild hoarse without stridor. NECK: Supple w/ fair ROM, JVD- none, normal carotid impulses w/o bruits Thyroid-  CHEST:clear to A and P, nothing but pseudowheeze resolves with purse lip maneuver  HEART: RRR, no m/g/r heard ABDOMEN: Soft and nl;  [id number], nl  pulses, no edema  NEURO: Grossly intact to observation      Impression & Recommendations:  Problem # 1:  ALLERGIC ASTHMA (ICD-493.00)  DDX of  difficult airways managment all start with A and  include Adherence, Ace Inhibitors, Acid Reflux, Active Sinus Disease, Alpha 1 Antitripsin deficiency, Anxiety masquerading as Airways dz,  ABPA,  allergy(esp in young), Aspiration (esp in elderly), Adverse effects of DPI,  Active smokers, plus one B  = Beta blocker use.Marland Kitchen    He is not adherent to qvar but despit  a perfect substrate for asthma(uri/rhinitis)  has perfectly clear lungs and disproportionate upper airway symptoms of choking coughing and sob.  Upper airway cough syndrome, so named because it's frequently impossible to sort out how much is LPR vs CR/sinusitis with freq throat clearing generating secondary extra esophageal GERD from wide swings in gastric pressure that occur with throat clearing, promoting self use of mint and menthol lozenges that reduce the lower esophageal sphincter tone and exacerbate the problem further.  These symptoms are easily confused with asthma/copd by even experienced pulmonogists because they overlap so much. These are the same pts who not infrequently have failed to tolerate ace inhibitors,  dry powder inhalers or biphosphonates or report having reflux symptoms that don't respond to standard doses of PPI   Rec off ace on a trial basis.    Problem # 2:  HYPERTENSION, BENIGN (ICD-401.1)  ACE inhibitors are problematic in  pts with airway complaints because  we can't always distinguish ace effects from copd/asthma.  By themselves they don't actually cause a problem, much like oxygen can't by itself start a fire, but they certainly serve as a powerful catalyst or enhancer for any "fire"  or inflammatory process in the upper airway, be it caused by an ET  tube or more commonly reflux (especially in the obese or pts with known GERD or who are on biphoshonates).  Here it may  be a uri that caused the decompensation.   In the era of ARB near equivalency until we have a better handle on the reversibility of the airway problem, it just makes sense to avoid ace entirely in the short run and then decide later, having established a level of airway control using a reasonable limited regimen, whether to add back ace but even then being very careful to observe the pt for worsening airway control and number of meds used/ needed to control symptoms.     Each maintenance medication was reviewed in detail including most importantly the difference between maintenance and as needed and under what circumstances the prns are to be used.   Orders: Est. Patient Level IV [id number])  Medications Added to  Medication List This Visit: 1)  Protonix 40 Mg Tbec (Pantoprazole sodium) .... Take  one 30-60 min before first meal of the day 2)  Qvar 80 Mcg/act Aers (Beclomethasone dipropionate) .... 2 puffs every am 3)  Zithromax Z-pak 250 Mg Tabs (Azithromycin) .... Take as directed 4)  Benicar 20 Mg Tabs (Olmesartan medoxomil) .... One a day 5)  Prednisone 10 Mg Tabs (Prednisone) .... 4 each am x 2days, 2x2days, 1x2days and stop  Patient Instructions: 1)  stop altace 2)  start benicar 20 mg one daily 3)  stop qvar 4)  take prednisone 10 4 each am x 2days, 2x2days, 1x2days and stop  5)  take a zpak 6)  Please schedule a follow-up appointment in 1 month 7)    Think of your medications in 3 separate categories and keep them separate:  8)  1)  The ones you take no matter what daily on a scheduled basis 9)  2)  The ones you only take if needed for specific problems 10)  3)  The ones you take for a short course and stop, like antibiotics and prednisone. 11)  GERD (REFLUX)  is a common cause of respiratory symptoms. It commonly presents without heartburn and can be treated with medication, but also with lifestyle changes including avoidance of late meals, excessive alcohol, smoking cessation, and  avoid fatty foods, chocolate, peppermint, colas, red wine, and acidic juices such as orange juice. NO MINT OR MENTHOL PRODUCTS SO NO COUGH DROPS  12)  USE SUGARLESS CANDY INSTEAD (jolley ranchers)  13)  NO OIL BASED VITAMINS  Prescriptions: PREDNISONE 10 MG  TABS (PREDNISONE) 4 each am x 2days, 2x2days, 1x2days and stop  #14 x 0   Entered and Authorized by:   Tanda Rockers MD   Signed by:   Tanda Rockers MD on 07/27/2009   Method used:   Electronically to        [facility] (retail)       [address], [place]  [id number]       Ph: [phone] or [phone]       Fax: [phone]   RxID:   [id number] ZITHROMAX Z-PAK 250 MG TABS (AZITHROMYCIN) take as directed  #1 x 0   Entered and Authorized by:   Tanda Rockers MD   Signed by:   Tanda Rockers MD on 07/27/2009   Method used:   Electronically to        [facility] (retail)       [address], [place]  [id number]       Ph: [phone] or [phone]       Fax: [phone]   RxID:   [id number]

## 2010-07-03 NOTE — Progress Notes (Signed)
Summary: appt  Phone Note Call from Patient Call back at Home Phone [phone]   Caller: Patient Call For: Yaacov Koziol Summary of Call: Wants to see Dr. Annamaria Boots today, sob, 102 temp, chest congestion, please advise. Initial call taken by: Netta Neat,  July 27, 2009 8:48 AM  Follow-up for Phone Call        Pt c/o SOB, productive cough with clear phlegm,  and temp of 102 x 2 days. pt requests OV today. I spoke with KW and she stated CY has no available appts today to place with another MD. I offered pt appt with TP in [place] but pt refused and stated he would see anyone here in [place] office, so pt scheduled with MW today at 2:20. Pt aware to bring current med list to appt. Grafton Bing CMA  July 27, 2009 10:04 AM

## 2010-07-03 NOTE — Letter (Signed)
Summary: Medication List/[facility]  Medication List/[facility]   Imported By: Phillis Knack 10/09/2009 13:52:17  _____________________________________________________________________  External Attachment:    Type:   Image     Comment:   External Document

## 2010-07-03 NOTE — Assessment & Plan Note (Signed)
Summary: NEW/SECURE HORIZIONS/[facility]  #   Vital Signs:  Patient profile:   70 year old male Height:      70.5 inches Weight:      255.50 pounds BMI:     36.27 O2 Sat:      96 % on Room air Temp:     98.3 degrees F oral Pulse rate:   67 / minute BP sitting:   132 / 68  (left arm) Cuff size:   large  Vitals Entered ByShirlean Mylar Ewing (Oct 06, 2009 9:39 AM)  O2 Flow:  Room air  Preventive Care Screening  Colonoscopy:    Date:  12/21/2003    Next Due:  01/2014    Results:  abnormal   PSA:    Date:  06/23/2009    Results:  0.3 ng/mL  Last Pneumovax:    Date:  06/04/2007    Results:  given   Last Tetanus Booster:    Date:  06/04/2007    Results:  Tdap   CC: New Pt, New Secure Horizones/RE   Primary Care Provider:  Art Nyoka Cowden  CC:  New Pt and New Secure Horizones/RE.  History of Present Illness: overall doing well, here to establish  new MD;  BP well controlled at home < 140/90, wt stable, good compliance with meds; recent LDL done at [facility] was 102 (prevoius 142 in jan 2011);  gets some blurred vision next day after dietary indiscretion, but does not check sugars and sugar in jan 2011 neg;  has realy been working on diet the last 6 mo;  trying to lose more wt;  has appt coming up with Dr Collier Salina for left hip pain with pain for 3 yrs - he's put off cortisone shot but is thinking of doing it now since he limps reproducibly after 1 to 2 miles walking;  cortisone did help with previous right plantar fasciits.  Pt denies CP, sob, doe, wheezing, orthopnea, pnd, worsening LE edema, palps, dizziness or syncope  Pt denies new neuro symptoms such as headache, facial or extremity weakness  Going to gym at least 3 times per way for 90 min each.    Preventive Screening-Counseling & Management  Alcohol-Tobacco     Smoking Status: quit      Drug Use:  no.    Problems Prior to Update: 1)  Bipolar Disorder Unspecified  (ICD-296.80) 2)  Nephrolithiasis, Hx of  (ICD-V13.01) 3)   Hypothyroidism  (ICD-244.9) 4)  Colonic Polyps, Hx of  (ICD-V12.72) 5)  Hypertension  (ICD-401.9) 6)  Hyperlipidemia  (ICD-272.4) 7)  Diabetes Mellitus, Type II  (ICD-250.00) 8)  Gerd  (ICD-530.81) 9)  Hypertension, Benign  (ICD-401.1) 10)  Diabetes, Type 2  (ICD-250.00) 11)  Obstructive Sleep Apnea  (ICD-327.23) 12)  Allergic Rhinitis  (ICD-477.9) 13)  Allergic Asthma  (ICD-493.00)  Medications Prior to Update: 1)  Flonase 50 Mcg/act  Susp (Fluticasone Propionate) .Marland Kitchen.. 1 Spray Each Nostril Once Daily 2)  Synthroid 50 Mcg  Tabs (Levothyroxine Sodium) .... Take 1 Tablet By Mouth Once A Day 3)  Hydrochlorothiazide 25 Mg  Tabs (Hydrochlorothiazide) .... Take 1 Tablet By Mouth Once A Day 4)  Protonix 40 Mg  Tbec (Pantoprazole Sodium) .... Take  One 30-60 Min Before First Meal of The Day 5)  Cpap Set At 9 .... [place], [place] 6)  Mucinex Dm 30-600 Mg Xr12h-Tab (Dextromethorphan-Guaifenesin) .... Take As Directed 7)  Proair Hfa 108 (90 Base) Mcg/act Aers (Albuterol Sulfate) .Marland KitchenMarland KitchenMarland Kitchen  2 Puffs Four Times A Day As Needed 8)  Tegretol 200 Mg Tabs (Carbamazepine) .... Take 2po At Bedtime 9)  Benicar 20 Mg Tabs (Olmesartan Medoxomil) .... One A Day 10)  Zithromax Z-Pak 250 Mg Tabs (Azithromycin) .... 2 Today Then One Daily  Current Medications (verified): 1)  Flonase 50 Mcg/act  Susp (Fluticasone Propionate) .Marland Kitchen.. 1 Spray Each Nostril Once Daily 2)  Synthroid 50 Mcg  Tabs (Levothyroxine Sodium) .... Take 1 Tablet By Mouth Once A Day 3)  Hydrochlorothiazide 25 Mg  Tabs (Hydrochlorothiazide) .... Take 1 Tablet By Mouth Once A Day 4)  Protonix 40 Mg  Tbec (Pantoprazole Sodium) .... Take  One 30-60 Min Before First Meal of The Day 5)  Cpap Set At 9 .... [place], [place] 6)  Mucinex Dm 30-600 Mg Xr12h-Tab (Dextromethorphan-Guaifenesin) .... Take As Directed 7)  Proair Hfa 108 (90 Base) Mcg/act Aers (Albuterol Sulfate) .... 2 Puffs  Four Times A Day As Needed 8)  Tegretol 200 Mg Tabs (Carbamazepine) .... Take 2po At Bedtime 9)  Benicar 20 Mg Tabs (Olmesartan Medoxomil) .... One A Day 10)  Zithromax Z-Pak 250 Mg Tabs (Azithromycin) .... 2 Today Then One Daily  Allergies (verified): 1)  ! Aciphex 2)  ! Ceftin 3)  ! * Cefzil 4)  * Pneumovax  Past History:  Family History: Last updated: 10-17-09 Father- died- apnea at [place] Mother- died myelofibrosis after throat cancer several with ETOH  Social History: Last updated: 10/17/2009 Married Retired Engineer, 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 06/23/77 3 children Former Smoker Alcohol use-no Drug use-no  Risk Factors: Smoking Status: quit (10/17/2009)  Past Medical History: OBSTRUCTIVE SLEEP APNEA (ICD-327.23) ALLERGIC RHINITIS (ICD-477.9) ALLERGIC ASTHMA (ICD-493.00) Diabetes, Type 2 Hypertension    - D/C Ace July 27, 2009  GERD DJD Hyperlipidemia Colonic polyps, hx of - hyperplastic july 2005/ upper endoscopy 2005 neg for barretts Hypothyroidism Nephrolithiasis, hx of Bipolar disorder  Past Surgical History: hernia repairs ganglion cyst goiter- needed tracheoswtomy for bleeding Tonsillectomy  Family History: Reviewed history from 04/24/2009 and no changes required. Father- died- apnea at nursing Home Mother- died myelofibrosis after throat cancer several with ETOH  Social History: Reviewed history from 04/24/2009 and no changes required. 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 06/23/77 3 children Former Smoker Alcohol use-no Drug use-no Drug Use:  no  Review of Systems       all otherwise negative per pt -    Physical Exam  General:  alert and overweight-appearing.   Head:  normocephalic and atraumatic.   Eyes:  vision grossly intact, pupils equal, and pupils round.   Ears:  R ear normal and L ear normal.   Nose:  no external deformity and no nasal discharge.   Mouth:  no  gingival abnormalities and pharynx pink and moist.   Neck:  supple and no masses.   Lungs:  normal respiratory effort and normal breath sounds.   Heart:  normal rate and regular rhythm.   Abdomen:  soft, non-tender, and normal bowel sounds.   Msk:  no joint tenderness and no joint swelling.   Extremities:  no edema, no erythema  Skin:  no rashes.   Psych:  not anxious appearing and not depressed appearing.     Impression & Recommendations:  Problem # 1:  HYPERTENSION (ICD-401.9)  His updated medication list for this problem includes:    Hydrochlorothiazide 25 Mg Tabs (Hydrochlorothiazide) .Marland Kitchen... Take 1 tablet by mouth once a day  Benicar 20 Mg Tabs (Olmesartan medoxomil) ..... One a day  BP today: 132/68 Prior BP: 108/66 (08/29/2009)  Prior 10 Yr Risk Heart Disease: Not enough information (08/29/2009) stable overall by hx and exam, ok to continue meds/tx as is   Problem # 2:  DIABETES MELLITUS, TYPE II (ICD-250.00)  His updated medication list for this problem includes:    Benicar 20 Mg Tabs (Olmesartan medoxomil) ..... One a day stable overall by hx and exam, ok to continue meds/tx as is ; does not need OHA at this time, to re-check next visit  Problem # 3:  HYPERLIPIDEMIA (ICD-272.4)  Prior 10 Yr Risk Heart Disease: Not enough information (08/29/2009) d/w pt - Pt to continue diet efforts,; to check labs next visit - goal LDL less than 70 , declines DM dietary referral  Complete Medication List: 1)  Flonase 50 Mcg/act Susp (Fluticasone propionate) .Marland Kitchen.. 1 spray each nostril once daily 2)  Synthroid 50 Mcg Tabs (Levothyroxine sodium) .... Take 1 tablet by mouth once a day 3)  Hydrochlorothiazide 25 Mg Tabs (Hydrochlorothiazide) .... Take 1 tablet by mouth once a day 4)  Protonix 40 Mg Tbec (Pantoprazole sodium) .... Take  one 30-60 min before first meal of the day 5)  Cpap Set At 9  .... [place], [place]  [phone] 6)  Mucinex Dm 30-600 Mg  Xr12h-tab (Dextromethorphan-guaifenesin) .... Take as directed 7)  Proair Hfa 108 (90 Base) Mcg/act Aers (Albuterol sulfate) .... 2 puffs four times a day as needed 8)  Tegretol 200 Mg Tabs (Carbamazepine) .... Take 2po at bedtime 9)  Benicar 20 Mg Tabs (Olmesartan medoxomil) .... One a day 10)  Zithromax Z-pak 250 Mg Tabs (Azithromycin) .... 2 today then one daily  Patient Instructions: 1)  Continue all previous medications as before this visit  2)  Please schedule a follow-up appointment in Jan 2012 with CPX labs

## 2010-07-04 ENCOUNTER — Encounter: Payer: Self-pay | Admitting: Internal Medicine

## 2010-07-05 NOTE — Assessment & Plan Note (Signed)
Summary: f/u appt/#/cd   Vital Signs:  Patient profile:   70 year old male Height:      71 inches Weight:      261.38 pounds BMI:     36.59 O2 Sat:      96 % on Room air Temp:     97.9 degrees F oral Pulse rate:   72 / minute BP sitting:   130 / 62  (left arm) Cuff size:   regular  Vitals Entered By: Shirlean Mylar Ewing CMA Deborra Medina) (June 28, 2010 9:32 AM)  O2 Flow:  Room air  CC: followup/RE   Primary Care Provider:  Art Nyoka Cowden  CC:  followup/RE.  History of Present Illness: here for wellness, and f/u - overall doing ok,  Pt denies CP, worsening sob, doe, wheezing, orthopnea, pnd, worsening LE edema, palps,  syncope  Pt denies new neuro symptoms such as headache, facial or extremity weakness  Pt denies polydipsia, polyuria, or low sugar symptoms such as shakiness improved with eating.  Overall good compliance with meds, trying to follow low chol, DM diet, wt stable, little excercise however  Overall good compliance with meds, and good tolerability.  No fever, wt loss, night sweats, loss of appetite or other constitutional symptoms  Denies worsening depressive symptoms, suicidal ideation, or panic, though has ongoing anxiety and somewhat animated today.   CBG;s in the lower 100's.   States going to gym to lift wts 3 times per wk . Pt did have an episode of lightheaded recent for about an hr last wk, but was able to to drive and finish his gym workout and has not recurred. Pt states good ability with ADL's, low fall risk, home safety reviewed and adequate, no significant change in hearing or vision, trying to follow lower chol diet, and occasionally active only with regular excercise.    Problems Prior to Update: 1)  Preventive Health Care  (ICD-V70.0) 2)  Bipolar Disorder Unspecified  (ICD-296.80) 3)  Nephrolithiasis, Hx of  (ICD-V13.01) 4)  Hypothyroidism  (ICD-244.9) 5)  Colonic Polyps, Hx of  (ICD-V12.72) 6)  Hypertension  (ICD-401.9) 7)  Hyperlipidemia  (ICD-272.4) 8)  Diabetes  Mellitus, Type II  (ICD-250.00) 9)  Gerd  (ICD-530.81) 10)  Hypertension, Benign  (ICD-401.1) 11)  Diabetes, Type 2  (ICD-250.00) 12)  Obstructive Sleep Apnea  (ICD-327.23) 13)  Allergic Rhinitis  (ICD-477.9) 14)  Allergic Asthma  (ICD-493.00)  Medications Prior to Update: 1)  Flonase 50 Mcg/act  Susp (Fluticasone Propionate) .Marland Kitchen.. 1 Spray Each Nostril Once Daily 2)  Levothyroxine Sodium 100 Mcg Tabs (Levothyroxine Sodium) .... Take 1 By Mouth Once Daily 3)  Hydrochlorothiazide 25 Mg  Tabs (Hydrochlorothiazide) .... Take 1 Tablet By Mouth Once A Day 4)  Protonix 40 Mg  Tbec (Pantoprazole Sodium) .... Take  One 30-60 Min Before First Meal of The Day 5)  Cpap Set At 9 .... [place], [place] 6)  Mucinex Dm 30-600 Mg Xr12h-Tab (Dextromethorphan-Guaifenesin) .... Take As Directed 7)  Proair Hfa 108 (90 Base) Mcg/act Aers (Albuterol Sulfate) .... 2 Puffs Four Times A Day As Needed 8)  Tegretol 200 Mg Tabs (Carbamazepine) .... Take 2po At Bedtime 9)  Benicar 20 Mg Tabs (Olmesartan Medoxomil) .... One A Day 10)  Zithromax Z-Pak 250 Mg Tabs (Azithromycin) .... 2 Today Then One Daily 11)  Advair Diskus 250-50 Mcg/dose Aepb (Fluticasone-Salmeterol) .Marland Kitchen.. 1 Puff and Rinse Mouth Well, Twice Daily- Maintenance Inhaler 12)  Doxycycline Hyclate 100 Mg Caps (Doxycycline Hyclate) .Marland KitchenMarland KitchenMarland Kitchen  Take 2 Today Then 1 Daily Until Gone  Current Medications (verified): 1)  Flonase 50 Mcg/act  Susp (Fluticasone Propionate) .Marland Kitchen.. 1 Spray Each Nostril Once Daily 2)  Levothyroxine Sodium 100 Mcg Tabs (Levothyroxine Sodium) .... Take 1 By Mouth Once Daily 3)  Hydrochlorothiazide 25 Mg  Tabs (Hydrochlorothiazide) .... Take 1 Tablet By Mouth Once A Day 4)  Protonix 40 Mg  Tbec (Pantoprazole Sodium) .... Take  One 30-60 Min Before First Meal of The Day 5)  Cpap Set At 9 .... [place], [place] 6)  Mucinex Dm 30-600 Mg Xr12h-Tab (Dextromethorphan-Guaifenesin)  .... Take As Directed 7)  Proair Hfa 108 (90 Base) Mcg/act Aers (Albuterol Sulfate) .... 2 Puffs Four Times A Day As Needed 8)  Tegretol 200 Mg Tabs (Carbamazepine) .... Take 2po At Bedtime 9)  Benicar 20 Mg Tabs (Olmesartan Medoxomil) .... One A Day 10)  Advair Diskus 250-50 Mcg/dose Aepb (Fluticasone-Salmeterol) .Marland Kitchen.. 1 Puff and Rinse Mouth Well, Twice Daily- Maintenance Inhaler  Allergies (verified): 1)  ! Aciphex 2)  ! Ceftin 3)  ! * Cefzil 4)  * Pneumovax  Past History:  Past Medical History: Last updated: 30-Oct-2009 OBSTRUCTIVE SLEEP APNEA (ICD-327.23) ALLERGIC RHINITIS (ICD-477.9) ALLERGIC ASTHMA (ICD-493.00) Diabetes, Type 2 Hypertension    - D/C Ace July 27, 2009  GERD DJD Hyperlipidemia Colonic polyps, hx of - hyperplastic july 2005/ upper endoscopy 2005 neg for barretts Hypothyroidism Nephrolithiasis, hx of Bipolar disorder  Past Surgical History: Last updated: Oct 30, 2009 hernia repairs ganglion cyst goiter- needed tracheoswtomy for bleeding Tonsillectomy  Family History: Last updated: 10/30/2009 Father- died- apnea at [place] Mother- died myelofibrosis after throat cancer several with ETOH  Social History: Last updated: Oct 30, 2009 Married Retired Engineer, 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 06/23/77 3 children Former Smoker Alcohol use-no Drug use-no  Risk Factors: Smoking Status: quit (04/24/2010) Packs/Day: 1.0 (04/24/2010)  Review of Systems  The patient denies anorexia, fever, vision loss, decreased hearing, hoarseness, chest pain, syncope, dyspnea on exertion, peripheral edema, prolonged cough, headaches, hemoptysis, abdominal pain, melena, hematochezia, severe indigestion/heartburn, hematuria, muscle weakness, suspicious skin lesions, transient blindness, difficulty walking, depression, unusual weight change, abnormal bleeding, enlarged lymph nodes, and angioedema.         all otherwise negative per pt -    Physical  Exam  General:  alert and overweight-appearing.   Head:  normocephalic and atraumatic.   Eyes:  vision grossly intact, pupils equal, and pupils round.   Ears:  R ear normal and L ear normal.   Nose:  no external deformity and no nasal discharge.   Mouth:  no gingival abnormalities and pharynx pink and moist.   Neck:  supple and no masses.   Lungs:  normal respiratory effort and normal breath sounds.   Heart:  normal rate and regular rhythm.   Abdomen:  soft, non-tender, and normal bowel sounds.   Msk:  no joint tenderness and no joint swelling.   Extremities:  no edema, no erythema  Neurologic:  cranial nerves II-XII intact, strength normal in all extremities, and gait normal.   Skin:  color normal and no rashes.   Psych:  not depressed appearing and moderately anxious.     Impression & Recommendations:  Problem # 1:  PREVENTIVE HEALTH CARE (ICD-V70.0) Overall doing well, age appropriate education and counseling updated, referral for preventive services and immunizations addressed, dietary counseling and smoking status adressed , most recent labs reviewed, ecg reviewed I have personally reviewed and have noted 1.The patient's medical  and social history 2.Their use of alcohol, tobacco or illicit drugs 3.Their current medications and supplements 4. Functional ability including ADL's, fall risk, home safety risk, hearing & visual impairment  5.Diet and physical activities 6.Evidence for depression or mood disorders The patients weight, height, BMI  have been recorded in the chart I have made referrals, counseling and provided education to the patient based review of the above  Orders: EKG w/ Interpretation (93000) TLB-PSA (Prostate Specific Antigen) (84153-PSA)  for some reason did not get the PSA ordered - will order  Problem # 2:  DIABETES MELLITUS, TYPE II (ICD-250.00)  His updated medication list for this problem includes:    Benicar 20 Mg Tabs (Olmesartan medoxomil) .....  One a day recent a1c slightly elevated, pt admits to dietary noncomplicane over the hoidays;  plans to do better, declines metformin at this time, to f/u  next visit  Labs Reviewed: Creat: 1.2 (06/21/2010)    Reviewed HgBA1c results: 7.2 (06/21/2010)  6.7 (03/09/2010)  Problem # 3:  HYPERTENSION (ICD-401.9)  His updated medication list for this problem includes:    Hydrochlorothiazide 25 Mg Tabs (Hydrochlorothiazide) .Marland Kitchen... Take 1 tablet by mouth once a day    Benicar 20 Mg Tabs (Olmesartan medoxomil) ..... One a day  BP today: 130/62 Prior BP: 134/72 (04/24/2010)  Prior 10 Yr Risk Heart Disease: Not enough information (08/29/2009)  Labs Reviewed: K+: 4.8 (06/21/2010) Creat: : 1.2 (06/21/2010)   Chol: 218 (06/21/2010)   HDL: 52.50 (06/21/2010)   TG: 198.0 (06/21/2010) stable overall by hx and exam, ok to continue meds/tx as is   Problem # 4:  HYPERLIPIDEMIA (ICD-272.4)  Labs Reviewed: SGOT: 42 (06/21/2010)   SGPT: 52 (06/21/2010)  Prior 10 Yr Risk Heart Disease: Not enough information (08/29/2009)   HDL:52.50 (06/21/2010)  Chol:218 (06/21/2010)  Trig:198.0 (06/21/2010) d/w pt - goal LDL < 70, but declines statin at this time - preferes to try diet first, and f/u labs next visit  Problem # 5:  BIPOLAR DISORDER UNSPECIFIED (ICD-296.80)  stable overall by hx and exam, ok to continue meds/tx as is - also for tegretol level today  Orders: T-Tegretol (Carbamazepine) [id number])  Complete Medication List: 1)  Flonase 50 Mcg/act Susp (Fluticasone propionate) .Marland Kitchen.. 1 spray each nostril once daily 2)  Levothyroxine Sodium 100 Mcg Tabs (Levothyroxine sodium) .... Take 1 by mouth once daily 3)  Hydrochlorothiazide 25 Mg Tabs (Hydrochlorothiazide) .... Take 1 tablet by mouth once a day 4)  Protonix 40 Mg Tbec (Pantoprazole sodium) .... Take  one 30-60 min before first meal of the day 5)  Cpap Set At 9  .... [place], [place]  [phone] 6)  Mucinex Dm 30-600  Mg Xr12h-tab (Dextromethorphan-guaifenesin) .... Take as directed 7)  Proair Hfa 108 (90 Base) Mcg/act Aers (Albuterol sulfate) .... 2 puffs four times a day as needed 8)  Tegretol 200 Mg Tabs (Carbamazepine) .... Take 2po at bedtime 9)  Benicar 20 Mg Tabs (Olmesartan medoxomil) .... One a day 10)  Advair Diskus 250-50 Mcg/dose Aepb (Fluticasone-salmeterol) .Marland Kitchen.. 1 puff and rinse mouth well, twice daily- maintenance inhaler  Patient Instructions: 1)  Please go to the Lab in the basement for your blood and/or urine tests today - the  PSA and tegretol level 2)  Please call the number on the [facility] for results of your testing 3)  Continue all previous medications as before this visit 4)  Please schedule a follow-up appointment in 6 months with: 5)  BMP prior to visit,  ICD-9: 250.02 6)  Lipid Panel prior to visit, ICD-9: 7)  HbgA1C prior to visit, ICD-9: 8)  You are given the discount card to sign up, and if eligible the benicar will be less expensive Prescriptions: ADVAIR DISKUS 250-50 MCG/DOSE AEPB (FLUTICASONE-SALMETEROL) 1 puff and rinse mouth well, twice daily- maintenance inhaler  #3 x 3   Entered and Authorized by:   Biagio Borg MD   Signed by:   Biagio Borg MD on 06/28/2010   Method used:   Print then Give to Patient   RxID:   [id number] PROAIR HFA 108 (90 BASE) MCG/ACT AERS (ALBUTEROL SULFATE) 2 puffs four times a day as needed  #3 x 3   Entered and Authorized by:   Biagio Borg MD   Signed by:   Biagio Borg MD on 06/28/2010   Method used:   Print then Give to Patient   RxID:   [id number] HYDROCHLOROTHIAZIDE 25 MG  TABS (HYDROCHLOROTHIAZIDE) Take 1 tablet by mouth once a day  #90 x 3   Entered and Authorized by:   Biagio Borg MD   Signed by:   Biagio Borg MD on 06/28/2010   Method used:   Print then Give to Patient   RxID:   [id number] LEVOTHYROXINE SODIUM 100 MCG TABS (LEVOTHYROXINE SODIUM) take 1 by mouth once daily  #90 x 3   Entered and Authorized by:    Biagio Borg MD   Signed by:   Biagio Borg MD on 06/28/2010   Method used:   Print then Give to Patient   RxID:   [id number] FLONASE 50 MCG/ACT  SUSP (FLUTICASONE PROPIONATE) 1 SPRAY EACH NOSTRIL once daily  #3 x 3   Entered and Authorized by:   Biagio Borg MD   Signed by:   Biagio Borg MD on 06/28/2010   Method used:   Print then Give to Patient   RxID:   [id number] TEGRETOL 200 MG TABS (CARBAMAZEPINE) take 2po at bedtime  #180 x 3   Entered and Authorized by:   Biagio Borg MD   Signed by:   Biagio Borg MD on 06/28/2010   Method used:   Print then Give to Patient   RxID:   [id number] PROTONIX 40 MG  TBEC (PANTOPRAZOLE SODIUM) Take  one 30-60 min before first meal of the day  #90 x 3   Entered and Authorized by:   Biagio Borg MD   Signed by:   Biagio Borg MD on 06/28/2010   Method used:   Print then Give to Patient   RxID:   [id number]    Orders Added: 1)  EKG w/ Interpretation [93000] 2)  T-Tegretol (Carbamazepine) [80156-23380] 3)  TLB-PSA (Prostate Specific Antigen) [id number] 4)  Est. Patient 65& > [id number]

## 2010-07-05 NOTE — Progress Notes (Signed)
Summary: Medication refill  Phone Note Refill Request Message from:  Fax from Pharmacy on May 29, 2010 10:32 AM  Refills Requested: Medication #1:  TEGRETOL 200 MG TABS take 2po at bedtime   Dosage confirmed as above?Dosage Confirmed   Notes: [facility] [phone] Initial call taken by: Shirlean Mylar Ewing CMA (AAMA),  May 29, 2010 10:32 AM    New/Updated Medications: TEGRETOL 200 MG TABS (CARBAMAZEPINE) take 2po at bedtime Prescriptions: TEGRETOL 200 MG TABS (CARBAMAZEPINE) take 2po at bedtime  #180 x 1   Entered and Authorized by:   Biagio Borg MD   Signed by:   Biagio Borg MD on 05/30/2010   Method used:   Print then Give to Patient   RxID:   [id number]   is pt seeing psychiatry for his bipolar?  if so, he should request this from his psychiatrist  Biagio Borg MD  May 29, 2010 1:19 PM   called pt left msg. to call back  called pt and he did see a Psychiatrist in 1998, but was released and had been getting Tegretol from Dr. Carlota Raspberry since then. He stated he has had no problems as long as he is on the medication.   ok for refill - done hardcopy to [facility] - dahlia  Biagio Borg MD  May 30, 2010 10:49 AM  Rx faxed to pharmacy [facility]iss[facility]ickles, [place]  May 30, 2010 10:55 AM

## 2010-07-05 NOTE — Progress Notes (Signed)
  Phone Note Refill Request Message from:  Fax from Pharmacy on May 29, 2010 10:30 AM  Refills Requested: Medication #1:  PROTONIX 40 MG  TBEC Take  one 30-60 min before first meal of the day   Dosage confirmed as above?Dosage Confirmed   Notes: [place] [phone] Initial call taken by: Shirlean Mylar Ewing CMA Deborra Medina),  May 29, 2010 10:31 AM    Prescriptions: PROTONIX 40 MG  TBEC (PANTOPRAZOLE SODIUM) Take  one 30-60 min before first meal of the day  #30 x 6   Entered by:   Sharon Seller CMA (AAMA)   Authorized by:   Biagio Borg MD   Signed by:   Sharon Seller CMA ([place]) on 05/29/2010   Method used:   Faxed to ...       [facility] (retail)       [address], [place]  [id number]       Ph: [phone] or [phone]       Fax: [phone]   RxID:   [id number]

## 2010-07-19 NOTE — Letter (Signed)
Summary: [facility] [place] By: Rise Patience 07/11/2010 11:57:58  _____________________________________________________________________  External Attachment:    Type:   Image     Comment:   External Document

## 2010-08-17 LAB — POCT I-STAT 4, (NA,K, GLUC, HGB,HCT)
HCT: 44 % (ref 39.0–52.0)
Hemoglobin: 15 g/dL (ref 13.0–17.0)
Sodium: 139 mEq/L (ref 135–145)

## 2010-08-24 ENCOUNTER — Other Ambulatory Visit: Payer: Self-pay | Admitting: Dermatology

## 2010-09-28 ENCOUNTER — Other Ambulatory Visit: Payer: Self-pay | Admitting: Internal Medicine

## 2010-10-01 ENCOUNTER — Other Ambulatory Visit: Payer: Self-pay | Admitting: Internal Medicine

## 2010-10-19 NOTE — Assessment & Plan Note (Signed)
[facility]                               PULMONARY OFFICE NOTE   NAME:Rodenbeck, TRAMAYNE BOURGEOIS                     MRN:          [id number]  DATE:04/18/2006                            DOB:          08-06-1940    PROBLEM:  1. Allergic asthma  2. Allergic rhinitis.  3. Obstructive sleep apnea.   HISTORY OF PRESENT ILLNESS:  He continues allergy vaccine at 1/10, giving  his own without problems.  We have discussed issues of administration  outside of a medical office, anaphylaxis, and epinephrine.  We refilled his  epinephrine injector.  He still is not sure of what his CPAP pressure  setting is, but says he is comfortable with it.  He has not been needing  fluticasone or Advair recently.  He has a large Chief of Staff and some dogs.  When he first got the bird, if he got close to it, it would make him sneeze,  but he says that no longer happens.  We have discussed environmental  precautions, especially as related to the animals and house dust.  He is  pretty satisfied with his current status, except that he is trying to get a  chin strap that is comfortable for him to use with his CPAP.   MEDICATIONS:  1. Synthroid.  2. Altace 10 mg.  3. HCTZ 25 mg.  4. Fluticasone.  5. Allergy vaccine.  6. CPAP.  7. Protonix 40 mg.  8. Albuterol inhaler.   ALLERGIES:  1. Drug intolerance of ACIPHEX, he says caused nausea.  2. CEFTIN, which may have cause anaphylaxis.   OBJECTIVE:  VITAL SIGNS:  Weight 254 pounds, blood pressure 128/70, pulse  regular and 67, room air saturation 96%.  GENERAL:  He is slightly hoarse, but says this is his normal voice quality.  HEENT:  There is no visible drainage, erythema, or stridor.  There are no  pressure marks on his face from CPAP.  He does have a faint pink line under  his chin he says is from his chin strap.  HEART:  Heart sounds are regular without murmur.  LUNGS:  Lung fields are clear.   IMPRESSION:  1. Allergic  rhinitis, adequately controlled.  2. Mild intermittent asthma.  3. Obstructive sleep apnea, adequately controlled with CPAP.   PLAN:  1. He is going to talk with home care about getting a more comfortable      chin strap.  2. Epi-Pen was discussed and refilled.  3. Schedule return in one year, earlier p.r.n.     Clinton D. Annamaria Boots, MD, [place], [place]  Electronically Signed    CDY/MedQ  DD: 04/19/2006  DT: 04/19/2006  Job #: [id number]   cc:   Viviann Spare Nyoka Cowden, M.D.

## 2010-10-19 NOTE — Op Note (Signed)
NAME:  Joseph Villa, Joseph Villa                        ACCOUNT NO.:  [id number]   MEDICAL RECORD NO.:  [id number]                   PATIENT TYPE:  AMB   LOCATION:  ENDO                                 FACILITY:  [facility]   PHYSICIAN:  Waverly Ferrari, M.D.                 DATE OF BIRTH:  05/05/1941   DATE OF PROCEDURE:  DATE OF DISCHARGE:                                 OPERATIVE REPORT   PROCEDURE:  Upper endoscopy with biopsy.   INDICATIONS FOR PROCEDURE:  Gastroesophageal reflux disease.   ANESTHESIA:  Demerol 50, Versed 5 mg.   DESCRIPTION OF PROCEDURE:  With the patient mildly sedated in the left  lateral decubitus position, the Olympus videoscopic endoscope was inserted  in the mouth and passed under direct vision through the esophagus which  appeared normal except for a question of possibly Barrett's esophagus or  abnormal anatomic variant, could not be determined because the esophagus  could not be inflated adequately.  A photograph and biopsy was taken of this  area. The endoscope was advanced in the stomach. The fundus, body, antrum,  duodenal bulb and second portion of the duodenum all appeared normal. From  this point, the endoscope was slowly withdrawn taking circumferential views  of the duodenal mucosa until the endoscope was then pulled back in the  stomach, placed in retroflexion to view the stomach from below. The  endoscope was straightened and withdrawn taking circumferential views of the  remaining gastric and esophageal mucosa. The patient's vital signs and pulse  oximeter remained stable. The patient tolerated the procedure well without  apparent complications.   FINDINGS:  Question of Barrett's esophagus biopsied.  Await biopsy report.  The patient will call me for results and followup with me as an outpatient.  Proceed to colonoscopy as planned.                                               Waverly Ferrari, M.D.    GMO/MEDQ  D:  12/21/2003  T:  12/21/2003  Job:   [id number]

## 2010-10-19 NOTE — H&P (Signed)
NAMEMIRANDA, MCELHINNY NO.:  [id number]   MEDICAL RECORD NO.:  [id number]          PATIENT TYPE:  INP   LOCATION:  5002                         FACILITY:  [facility]   PHYSICIAN:  Minna Merritts, M.D.DATE OF BIRTH:  1941-02-18   DATE OF ADMISSION:  02/17/2005  DATE OF DISCHARGE:                                HISTORY & PHYSICAL   PRIMARY CARE PHYSICIAN:  Viviann Spare. Nyoka Cowden, M.D.   GASTROENTEROLOGIST:  Loralee Pacas. Sharlett Iles, M.D. has seen him in the past.   HISTORY OF PRESENT ILLNESS:  This is a 70 year old Caucasian male with past  medical history of GERD. He was in his usual state of health until three  days ago when he suddenly started diarrhea at 4 o'clock in the morning, the  stool consistency was watery, no blood, no mucus. Since then, he has been  having about four episodes per day. Also started with abdominal last night  which has been constantly present in the lower abdominal area. He cannot  explain the character of his abdominal pain. It does subside somewhat after  a bowel movement. He does not have any fever. He does not have any nausea or  vomiting. He denies taking an antibiotic treatment within the last one  month, although just about before a month he was on five days of Z-Pak. He  is not sure if he has consumed any bagged spinach. The patient has mentioned  that he has been feeling bloated on and off over the last one month, has  effected his appetite, and he feels full all the time.   The patient is complaining of bright red blood per rectum since last night.   PAST MEDICAL HISTORY:  1.  GERD.  2.  Hypothyroidism, the patient has had thyroidectomy about 40 years ago,      has been on thyroid replacement.  3.  Asthma.  4.  Hypertension.  5.  Diabetes type 2 which has been diet controlled.  6.  The patient had upper esophagogastroduodenoscopy in July of 2005 due to      his complaints of GERD. His biopsies were negative.  7.  He had a colonoscopy  in July of 2005 as well. Findings were of internal      hemorrhoids, cecal polyp which was hyperplastic on biopsy.  8.  He has had a hernia surgery several years ago.  9.  Bipolar disorder.  10. Ureteral nephrolithiasis left side. This was surgically removed.   MEDICATIONS:  1.  Synthroid.  2.  Tegretol.  3.  Advair 1 puff b.i.d.  4.  Altace.  5.  Dosages of the medications are not known. Family will bring the      information from the patient's home.   ALLERGIES:  CEFZIL.   FAMILY HISTORY:  Noncontributory.   SOCIAL HISTORY:  The patient lives at home with his wife. He is a former  smoker and quit about 20 years ago. He does not consume any alcohol or use  illicit drugs.   REVIEW OF SYSTEMS:  As mentioned in the history of present illness.   PHYSICAL  EXAMINATION:  GENERAL:  This is a pleasant Caucasian male patient  who appears slightly overweight but does not appear to be in acute distress.  VITAL SIGNS:  Temperature is 98.9, decreased blood pressure 138/65, pulse is  68 per minute and regular, respirations 20 per minute. He is saturating 97%  on room air. Pale scale is about 3 out of 10 abdominal pain at present.  HEENT:  Head is normocephalic and atraumatic. Pharynx appears normal. Oral  mucosa is moist. Bilaterally pupils are equally reactive to light.  Extraocular movements intact.  NECK:  Supple. No jugular venous distention. There is a scar from  thyroidectomy in the midline. No carotid bruits.  CARDIOVASCULAR:  S1 and S2 normal. No murmurs appreciated. The rate and  rhythm is regular.  PULMONARY:  Clear to auscultation bilaterally. Air entry appears to be  normal bilaterally.  ABDOMEN:  Obese. There is a presence of mild tenderness in hypogastric,  right lower quadrant, left lower quadrant, left periumbilical area. I do not  appreciate any rebound tenderness. There is no guarding or rigidity. No  hepatosplenomegaly. The bowel sounds are hypoactive in all the  quadrants.  EXTREMITIES:  Without clubbing, cyanosis, or edema. Pedal pulses are 2+ and  symmetric.  NEUROLOGICAL:  Overall normal. No focal deficit.   LABORATORY DATA:  White count is 9000 with 80% neutrophils, 13% lymphocytes,  and 7% monocytes. Hemoglobin is 15.4, hematocrit is 45.3, platelets are  209,000. Urine had a specific gravity of 1.029, no glucose, negative for  nitrites or esterase. No protein. Sodium is 138, potassium is 3.6, chloride  104, BUN is 12, blood glucose is 109. Serum creatinine is 1.1. Fecal occult  blood test is positive.   RADIOLOGY DATA:  X-ray of the abdomen shows dilation of small and large  bowels, ileus pattern.   ASSESSMENT/PLAN:  85.  A 70 year old male patient with three days of new onset diarrhea,      bloating, abdominal pain: Abdominal x-ray shows ileus. At this time, we      will admit, hydrate. We will place a nasogastric tube. We will get stool      samples for ova parasites, white blood cells, Clostridium difficile      toxin. We will also obtain amylase, liver function test, ____________.      We will keep n.p.o. The patient does have bright red blood per rectum,      although he does have a history of some internal hemorrhoids. At this      time, I will consult gastroenterologist, Dr. Loralee Pacas. Sharlett Iles.      Medications wise, I will start him on Protonix IV. I do not see any need      for antibiotics at this time.  2.  Diabetes type 2, diet controlled:  I will check the capillary blood      sugars q.h.s. and q.a.c. Put in order for sliding scale insulin. We will      check hemoglobin A1c.  3.  Hypertension:  We will resume his Altace.  4.  Bipolar disorder:  His Tegretol p.o. will be resumed once the exact      dosage is obtained.  5.  Status post thyroidectomy:  The patient takes Synthroid. It will be      resumed once the exact dose is known.  6.  Asthma:  This is not a problem at this time. It is stable. 7.  Gastroesophageal reflux  disease:  As mentioned above. We will start  him      on Protonix.  8.  Bright red blood per rectum:  The patient does have a history of      internal hemorrhoids. This started yesterday      and the patient thinks that he started that immediately after one of his      bowel movements. We will observe. We will check hemoglobin and      hematocrit daily. We will also consult gastroenterologist at this time.  9.  Deep vein thrombosis prophylaxis:  The patient will ambulate as      tolerated.           ______________________________  Minna Merritts, M.D.     PMJ/MEDQ  D:  02/17/2005  T:  02/18/2005  Job:  [id number]

## 2010-10-19 NOTE — Discharge Summary (Signed)
NAMESTYLES, HENNESSEE NO.:  [id number]   MEDICAL RECORD NO.:  [id number]          PATIENT TYPE:  INP   LOCATION:  5002                         FACILITY:  [facility]   PHYSICIAN:  Edythe Lynn, M.D.       DATE OF BIRTH:  Oct 08, 1940   DATE OF ADMISSION:  02/17/2005  DATE OF DISCHARGE:  02/19/2005                                 DISCHARGE SUMMARY   PRIMARY CARE PHYSICIAN:  Dr. Jeanmarie Hubert.   PRIMARY GASTROENTEROLOGIST:  Dr. Verl Blalock with [facility].   DISCHARGE DIAGNOSES:  1.  Acute enteritis.  2.  Severe diarrhea secondary to above.  3.  Bleeding hemorrhoids.  4.  Gastroesophageal reflux disease.  5.  Hypothyroidism.  6.  Asthma.  7.  Hypertension.  8.  Diabetes mellitus, diet controlled.  9.  Status post abdominal hernia surgical repair.  10. Bipolar disorder.  11. Status post removal of nephrolithiasis.  12. Obesity.  13. Hypokalemia.   DISCHARGE MEDICATIONS:  1.  Synthroid 50 mcg daily.  2.  Nexium 40 mg daily.  3.  Carbamazepine 400 mg at bedtime.  4.  Metronidazole 500 mg p.o. b.i.d. for 5 days.  5.  Ciprofloxacin 500 mg p.o. b.i.d. for 5 days.  6.  Advair, fluticasone, and albuterol inhalers as before.  7.  Potassium chloride 10 mEq by mouth daily for 1 week.  8.  Gas-X 1-2 tablet by mouth twice a day as needed for gas.  9.  Anusol-HC suppository 3 times a day.  10. Patient is also taking Altace 10 mg daily and hydrochlorothiazide 25 mg      daily, but he was instructed to start those medications only after the      diarrhea stops.   CONDITION ON DISCHARGE:  Patient was discharged in fair condition.  At the  time of discharge, he was still having diarrhea, but he was able to hydrate  himself orally.  He was able to ambulate without any difficulty and he was  afebrile and in no acute distress.  Patient was instructed to follow up with  Dr. Jeanmarie Hubert from [facility] on October 4. 2006 at 12 p.m.  He was also instructed to follow up  with Dr. Scarlette Shorts from [facility] on March 04, 2005 at 2 p.m.   CONSULTATION DURING THIS ADMISSION:  We have consulted [facility], Dr. Carlean Purl and Dr. Henrene Pastor.   PROCEDURE:  Computer tomography scan of the abdomen and pelvis was obtained  with the following result:  Left nephrolithiasis and no hydronephrosis,  atheromatous aorta, mild prostatic enlargement, atheromatous iliac arteries,  colon was unremarkable.   BRIEF ADMISSION H7P:  For full admission history and physical, please refer  to the dictated history and physical done by Dr. Orvan July.  Briefly, Mr.  Inlow is a 70 year old man with history of gastroesophageal reflux disease  who has had watery diarrhea for 3 days that did not subside.  He came to the  emergency room because the abdomen got distended and he was experiencing  significant pain and discomfort.  His vital signs on  admission:  He was  afebrile with stable blood pressure.  He was admitted to the hospital for  further evaluation.   HOSPITAL COURSE:  1.  Diarrhea:  Stool studies were obtained.  At the time of discharge, the      stool culture was still pending, but the Clostridium difficile toxin      assay was negative.  White blood cells were not present in the stool and      computer tomography scan of the abdomen did not show colitis.  Patient      was stable hemodynamically-wise for 48 hours and he was completely      afebrile during hospital stay.  He was treated empirically for possible      enteritis with ciprofloxacin and metronidazole, and he will continue      this treatment orally at home for 5 more days.  Followup:  If the stool      cultures because positive for any significant pathogen, his primary care      physician will be contacted with the result.  At the time of discharge,      the patient was still having 3-5 loose bowel movements every day, but he      was able to keep fluids down, hydrate himself, and  eat a soft diet.  2.  Abdominal distention:  Upon admission, Mr. Guzman had significant      abdominal distention.  He had an NG tube placed and continuous      suctioning was applied.  A computer tomography scan of the abdomen was      obtained to rule out small bowel obstruction.  His CT scan did not show      any obstruction.  NG tube was taken out and patient was able to tolerate      a clear liquid diet.  He still experienced significant gas formation and      bloating.  It was recommended that the patient take some simethicone as      an outpatient to help with the gas and the bloating.  3.  Bleeding hemorrhoids:  Mr. Anello has a long history of internal and      external hemorrhoids.  He had significant symptoms while in the hospital      and we will recommend Anusol-HC at the time of discharge to relieve his      symptoms.  4.  Hypothyroidism:  A TSH was checked in the hospital and it was within      normal limits.  He was continued on his usual dose of 50 mcg of      Synthroid daily.  5.  Bipolar disorder:  Patient was kept on his dose of Tegretol and there      were no events.  6.  Hypokalemia secondary to diarrhea:  Patient was given a dose of 40 mEq      of potassium in the hospital and he was also instructed to take 10 mEq      of potassium for 1 week at the time of discharge.           ______________________________  Edythe Lynn, M.D.     SL/MEDQ  D:  02/19/2005  T:  02/19/2005  Job:  [id number]   cc:   Viviann Spare. Nyoka Cowden, M.D.  Fax: [place]. Henrene Pastor, M.D. [facility]  [address]  [place] [address]

## 2010-10-19 NOTE — Op Note (Signed)
NAME:  Joseph Villa, Joseph Villa                        ACCOUNT NO.:  [id number]   MEDICAL RECORD NO.:  [id number]                   PATIENT TYPE:  AMB   LOCATION:  ENDO                                 [facility]ACILITY[facility]  [facility]   PHYSICIAN:  Waverly Ferrari, M.D.                 DATE OF BIRTH:  Jul 22, 1940   DATE OF PROCEDURE:  12/21/2003  DATE OF DISCHARGE:                                 OPERATIVE REPORT   PROCEDURE:  Colonoscopy.   INDICATIONS FOR PROCEDURE:  Colon cancer screening, colon polyp.   ANESTHESIA:  Demerol 20, Versed 2 mg.   DESCRIPTION OF PROCEDURE:  With the patient mildly sedated in the left  lateral decubitus position, the Olympus videoscopic colonoscope was inserted  in the rectum and passed under direct vision to the cecum identified by the  ileocecal valve and appendiceal orifice both of which were photographed. The  ileocecal valve appeared somewhat prominent so I elected to biopsy it.  From  this point, the colonoscope was then slowly withdrawn taking circumferential  views of the remaining colonic mucosa stopping one valve removed from the  ileocecal valve where a polyp was seen, photographed and removed using snare  cautery technique on a setting of 20/20 blended current.  The tissue was  suctioned into the scope and retained for pathology.  The endoscope was then  withdrawn all the way to the rectum which appeared normal on direct and  showed hemorrhoids on retroflexed view.  The endoscope was straightened and  withdrawn. The patient's vital signs and pulse oximeter remained stable. The  patient tolerated the procedure well without apparent complications.   FINDINGS:  Internal hemorrhoids, polyp of cecum and prominent ileocecal  valve the latter of which were biopsied and removed. Await biopsy report.  The patient will call me for results and followup with me as an outpatient.                                               Waverly Ferrari, M.D.    GMO/MEDQ  D:  12/21/2003   T:  12/21/2003  Job:  [id number]   cc:   Viviann Spare. Nyoka Cowden, M.D.  [address]  [place]  [place] [address]  Fax: [phone]

## 2010-10-19 NOTE — Consult Note (Signed)
NAME:  Joseph Villa, Joseph Villa NO.:  [id number]   MEDICAL RECORD NO.:  [id number]          PATIENT TYPE:  INP   LOCATION:  5002                         FACILITY:  [facility]   PHYSICIAN:  Gatha Mayer, M.D. LHCDATE OF BIRTH:  April 11, 1941   DATE OF CONSULTATION:  02/17/2005  DATE OF DISCHARGE:                                   CONSULTATION   PRIMARY CARE PHYSICIAN:  Viviann Spare. Nyoka Cowden, M.D.   REASON FOR CONSULTATION:  Diarrhea with dilated bowel on x-ray.   ASSESSMENT:  1.  Acute diarrheal illness associated with colonic and small bowel      dilation. He has a very distended abdomen with guarding. Most likely      this is an infectious etiology such a Clostridium difficile colitis or a      bacterial infection. He did receive a Z-Pak one month ago which      increases his risk for C. difficile colitis, he has been eating some      salads out at a local pub, but is unaware of any bagged spinach or other      obvious risk factors for infectious colitis. He has not had any recent      travel.  2.  Bipolar disorder, on Tegretol.   RECOMMENDATIONS AND PLAN:  1.  Start empiric IV Cipro and metronidazole.  2.  The patient has some bright red blood per rectum which is likely from      his hemorrhoids (previous colonoscopy on December 21, 2003, has demonstrated      internal hemorrhoids, hyperplastic polyp, and prominent ileocecal valve      with negative biopsies).  3.  Follow up abdominal film and labs in the morning.  4.  If he fails to respond sigmoidoscopy could be indicated to help      diagnose, depending upon what the stool studies tell us.   HISTORY:  This is a 70 year old white man who had been in his usual state of  health with several chronic conditions. He developed acute diarrhea after a  normal bowel movement early in the hours of February 14, 2005. He had  several watery bowel movements, eventually started seeing some bright red  blood. He then felt somewhat better  the next day, but then had recurrent  symptoms associated with severe crampy abdominal pain and explosive bowel  movements that were essentially yellow in color.  He denies fever, but says  his temperature was 102 when he got up here though. He did not feel hot and  questions if the machine was correct. He has never really had problems like  this before except when he had oysters about four years ago and became ill  in [place], [place]. Dr. Lajoyce Corners performed his colonoscopy as mentioned  above in 2005. He also had an EGD that questioned possible Barrett's  esophagus, but his biopsies were negative. Since that time he has seen Dr.  Verl Blalock and considers him his gastroenterologist at this time. The  patient has an NG in place with minimal drainage, he feels a little bit  better  at this point, but does have intermittent crampy abdominal pain and  borborygmi associated with it.  He does get some relief when he empties his  bowels.   PAST MEDICAL HISTORY:  1.  As above regarding colonoscopy, EGD with internal hemorrhoids,      hyperplastic polyp, and reflux disease.  2.  Asthma.  3.  Obesity.  4.  Hypertension.  5.  Status post thyroidectomy due to goiter.  6.  Diabetes mellitus, type 2, on diet control.  7.  Nephrolithiasis requiring surgical removal of calculus.  8.  Hernia repair.  9.  Hemorrhoid injection and ligation by Dr. Annabell Sabal in the past.  10. Osteoarthritis.   MEDICATIONS:  Synthroid, Tegretol, Advair, and Altace. He may have been on  prednisone as well.   DRUG ALLERGIES:  CEFZIL.   SOCIAL HISTORY:  The patient's brother is here with him, he is a nonsmoker.  No alcohol.  He is married.   FAMILY HISTORY:  Noncontributory.   REVIEW OF SYSTEMS:  Some back pain at times and he has had some problems  with psoriasis. He denies any myalgias or headache. He has not had vomiting,  perhaps a little bit of nausea but not much. All other systems appear  negative at  this time.   PHYSICAL EXAMINATION:  GENERAL: A pleasant, middle-age white male in no  acute distress.  VITAL SIGNS: Blood pressure 138/65, pulse 69 and regular, respirations 20,  temperature 98.9 and then 102.  HEENT: Eyes anicteric. Mouth reveals posterior pharynx with no lesions.  NECK: Supple with no mass or thyromegaly.  CHEST: Clear.  HEART: S1 and S2. No murmurs, rubs, or gallops.  ABDOMEN: Obese, tympanitic, distended. It is relatively soft. He guards some  with pain in the left lower quadrant. Bowel sounds are diminished.  RECTAL EXAM: Per the admitting physician showed heme-positive stool, he was  tender.  EXTREMITIES: Without clubbing, cyanosis, or edema.  SKIN: Without obvious acute rash, is warm and dry.  NEUROLOGIC: He is alert and oriented times three.  LYMPH NODES: No neck or supraclavicular nodes.   LABORATORY STUDIES:  CMET was normal except for a glucose of 109, BUN and  creatinine normal, white count normal at 9, hemoglobin 16, hematocrit 47  that is a little high, 80% neutrophils on his automated diff.   Abdominal films demonstrate markedly dilated colon and some dilation of the  small bowel without any obvious significant thickening of the wall on my  reading. This is a diffuse dilation of the colon and the distal small bowel.  There were no air-fluid levels.   I appreciate the opportunity to care for this patient.      Gatha Mayer, M.D. [facility]  Electronically Signed     CEG/MEDQ  D:  02/17/2005  T:  02/18/2005  Job:  [id number]   cc:   Viviann Spare. Nyoka Cowden, M.D.  Fax: [place]. Sharlett Iles, M.D. [facility]  [address]  [place] [address]

## 2010-11-05 ENCOUNTER — Other Ambulatory Visit: Payer: Self-pay | Admitting: Internal Medicine

## 2010-12-21 ENCOUNTER — Other Ambulatory Visit: Payer: Self-pay | Admitting: Dermatology

## 2010-12-21 ENCOUNTER — Other Ambulatory Visit: Payer: Self-pay

## 2010-12-21 ENCOUNTER — Other Ambulatory Visit: Payer: Self-pay | Admitting: Internal Medicine

## 2010-12-23 ENCOUNTER — Encounter: Payer: Self-pay | Admitting: Internal Medicine

## 2010-12-23 DIAGNOSIS — Z0001 Encounter for general adult medical examination with abnormal findings: Secondary | ICD-10-CM | POA: Insufficient documentation

## 2010-12-24 ENCOUNTER — Other Ambulatory Visit: Payer: Self-pay | Admitting: Internal Medicine

## 2010-12-24 ENCOUNTER — Other Ambulatory Visit (INDEPENDENT_AMBULATORY_CARE_PROVIDER_SITE_OTHER): Payer: Self-pay

## 2010-12-24 LAB — BASIC METABOLIC PANEL
Chloride: 101 mEq/L (ref 96–112)
Glucose, Bld: 162 mg/dL — ABNORMAL HIGH (ref 70–99)
Potassium: 4.1 mEq/L (ref 3.5–5.1)
Sodium: 136 mEq/L (ref 135–145)

## 2010-12-24 LAB — HEMOGLOBIN A1C: Hgb A1c MFr Bld: 7.4 % — ABNORMAL HIGH (ref 4.6–6.5)

## 2010-12-24 LAB — LIPID PANEL
Cholesterol: 225 mg/dL — ABNORMAL HIGH (ref 0–200)
VLDL: 36.2 mg/dL (ref 0.0–40.0)

## 2010-12-25 ENCOUNTER — Encounter: Payer: Self-pay | Admitting: Internal Medicine

## 2010-12-28 ENCOUNTER — Encounter: Payer: Self-pay | Admitting: Internal Medicine

## 2010-12-28 ENCOUNTER — Ambulatory Visit (INDEPENDENT_AMBULATORY_CARE_PROVIDER_SITE_OTHER): Payer: Medicare Other | Admitting: Internal Medicine

## 2010-12-28 VITALS — BP 130/62 | HR 63 | Temp 98.2°F | Ht 71.0 in | Wt 261.5 lb

## 2010-12-28 DIAGNOSIS — Z0000 Encounter for general adult medical examination without abnormal findings: Secondary | ICD-10-CM

## 2010-12-28 DIAGNOSIS — I10 Essential (primary) hypertension: Secondary | ICD-10-CM

## 2010-12-28 DIAGNOSIS — E119 Type 2 diabetes mellitus without complications: Secondary | ICD-10-CM

## 2010-12-28 DIAGNOSIS — E785 Hyperlipidemia, unspecified: Secondary | ICD-10-CM

## 2010-12-28 NOTE — Progress Notes (Signed)
Subjective:    Patient ID: SIMION RIEDL, male    DOB: [birth year], 70 y.o.   MRN: [id number]  HPI  Here to f/u; overall doing ok,  Pt denies chest pain, increased sob or doe, wheezing, orthopnea, PND, increased LE swelling, palpitations, dizziness or syncope.  Pt denies new neurological symptoms such as new headache, or facial or extremity weakness or numbness   Pt denies polydipsia, polyuria, or low sugar symptoms such as weakness or confusion improved with po intake.  Pt states overall good compliance with meds, trying to follow lower cholesterol, diabetic diet, wt overall stable but little exercise however.  Did have wt down to 255, but then went on cruise to [place], then has trying to get back down to 255 or less.  CBG 144 this am. Past Medical History  Diagnosis Date  . ALLERGIC ASTHMA 11/25/2007  . ALLERGIC RHINITIS 11/25/2007  . BIPOLAR DISORDER UNSPECIFIED 10/06/2009  . COLONIC POLYPS, HX OF 10/06/2009  . DM w/o Complication Type II 123456  . GERD 10/06/2009  . HYPERLIPIDEMIA 10/06/2009  . HYPERTENSION, BENIGN 07/27/2009  . HYPOTHYROIDISM 10/06/2009  . NEPHROLITHIASIS, HX OF 10/06/2009  . OBSTRUCTIVE SLEEP APNEA 11/25/2007  . DJD (degenerative joint disease)    Past Surgical History  Procedure Date  . Hernia repair   . Ganglion cyst excision   . Goiter   . Tonsillectomy     reports that he has quit smoking. He does not have any smokeless tobacco history on file. He reports that he does not drink alcohol or use illicit drugs. family history includes Alcohol abuse in his other and Cancer in his mother. Allergies  Allergen Reactions  . Cefprozil     REACTION: unknown  . Cefuroxime Axetil     REACTION: anaphylaxis  . Rabeprazole Sodium     REACTION: nausea   Current Outpatient Prescriptions on File Prior to Visit  Medication Sig Dispense Refill  . ACCU-CHEK COMFORT CURVE test strip TWICE DAILY  100 each  1  . albuterol (PROAIR HFA) 108 (90 BASE) MCG/ACT inhaler Inhale into the  lungs 4 (four) times daily.        Marland Kitchen BENICAR 20 MG tablet TAKE ONE TABLET EVERY DAY  30 tablet  10  . carbamazepine (TEGRETOL) 200 MG tablet Take 400 mg by mouth at bedtime.        . fluticasone (FLONASE) 50 MCG/ACT nasal spray Place 1 spray into the nose daily.        . Fluticasone-Salmeterol (ADVAIR DISKUS) 250-50 MCG/DOSE AEPB Inhale 1 puff into the lungs every 12 (twelve) hours.        . hydrochlorothiazide 25 MG tablet Take 25 mg by mouth daily.        Marland Kitchen levothyroxine (SYNTHROID, LEVOTHROID) 100 MCG tablet Take 100 mcg by mouth daily.        . pantoprazole (PROTONIX) 40 MG tablet Take 40 mg by mouth daily.         Review of Systems Review of Systems  Constitutional: Negative for diaphoresis and unexpected weight change.  HENT: Negative for drooling and tinnitus.   Eyes: Negative for photophobia and visual disturbance.  Respiratory: Negative for choking and stridor.   Gastrointestinal: Negative for vomiting and blood in stool.  Genitourinary: Negative for hematuria and decreased urine volume. .       Objective:   Physical Exam BP 130/62  Pulse 63  Temp(Src) 98.2 F (36.8 C) (Oral)  Ht 5\' 11"  (1.803 m)  Wt 261 lb 8  oz (118.616 kg)  BMI 36.47 kg/m2  SpO2 97% Physical Exam  VS noted Constitutional: Pt appears well-developed and well-nourished.  HENT: Head: Normocephalic.  Right Ear: External ear normal.  Left Ear: External ear normal.  Eyes: Conjunctivae and EOM are normal. Pupils are equal, round, and reactive to light.  Neck: Normal range of motion. Neck supple.  Cardiovascular: Normal rate and regular rhythm.   Pulmonary/Chest: Effort normal and breath sounds normal.  Abd:  Soft, NT, non-distended, + BS Neurological: Pt is alert. No cranial nerve deficit.  Skin: Skin is warm. No erythema.  Psychiatric: Pt behavior is normal. Thought content normal.         Assessment & Plan:   No problem-specific assessment & plan notes found for this encounter.

## 2010-12-28 NOTE — Assessment & Plan Note (Addendum)
Mild, uncontrolled, but he declines OHA such as metformin as he is successfully been losing some wt recently;  Continue all other medications as before and diet efforts, wt loss  Lab Results  Component Value Date   HGBA1C 7.4* 12/24/2010

## 2010-12-28 NOTE — Assessment & Plan Note (Signed)
increased overall from 140's LDL to over 160 now, most recent data reviewed with pt, and pt to continue medical treatment as before, declines statin for now, goal ldl < 70, may consider next visit

## 2010-12-28 NOTE — Patient Instructions (Signed)
Continue all other medications as before Please return in 6 mo with Lab testing done 3-5 days before

## 2010-12-28 NOTE — Assessment & Plan Note (Signed)
stable overall by hx and exam, most recent data reviewed with pt, and pt to continue medical treatment as before  BP Readings from Last 3 Encounters:  12/28/10 130/62  06/28/10 130/62  04/24/10 134/72

## 2011-02-16 ENCOUNTER — Other Ambulatory Visit: Payer: Self-pay | Admitting: Internal Medicine

## 2011-03-07 ENCOUNTER — Ambulatory Visit: Payer: Medicare Other | Admitting: Internal Medicine

## 2011-03-21 ENCOUNTER — Ambulatory Visit (INDEPENDENT_AMBULATORY_CARE_PROVIDER_SITE_OTHER): Payer: Medicare Other | Admitting: *Deleted

## 2011-03-21 DIAGNOSIS — Z23 Encounter for immunization: Secondary | ICD-10-CM

## 2011-03-21 NOTE — Progress Notes (Signed)
Addended by: Cresenciano Lick on: 03/21/2011 03:01 PM   Modules accepted: Orders

## 2011-04-18 ENCOUNTER — Encounter: Payer: Self-pay | Admitting: Internal Medicine

## 2011-04-18 ENCOUNTER — Ambulatory Visit (INDEPENDENT_AMBULATORY_CARE_PROVIDER_SITE_OTHER): Payer: Medicare Other | Admitting: Internal Medicine

## 2011-04-18 VITALS — BP 126/60 | HR 78 | Temp 98.5°F | Ht 71.5 in | Wt 249.1 lb

## 2011-04-18 DIAGNOSIS — I10 Essential (primary) hypertension: Secondary | ICD-10-CM

## 2011-04-18 DIAGNOSIS — M169 Osteoarthritis of hip, unspecified: Secondary | ICD-10-CM | POA: Insufficient documentation

## 2011-04-18 DIAGNOSIS — K644 Residual hemorrhoidal skin tags: Secondary | ICD-10-CM

## 2011-04-18 DIAGNOSIS — M47816 Spondylosis without myelopathy or radiculopathy, lumbar region: Secondary | ICD-10-CM

## 2011-04-18 DIAGNOSIS — M25559 Pain in unspecified hip: Secondary | ICD-10-CM

## 2011-04-18 DIAGNOSIS — E119 Type 2 diabetes mellitus without complications: Secondary | ICD-10-CM

## 2011-04-18 DIAGNOSIS — M25552 Pain in left hip: Secondary | ICD-10-CM | POA: Insufficient documentation

## 2011-04-18 HISTORY — DX: Osteoarthritis of hip, unspecified: M16.9

## 2011-04-18 HISTORY — DX: Spondylosis without myelopathy or radiculopathy, lumbar region: M47.816

## 2011-04-18 MED ORDER — HYDROCORTISONE 2.5 % RE CREA: TOPICAL_CREAM | Freq: Two times a day (BID) | RECTAL | Status: DC

## 2011-04-18 MED ORDER — MELOXICAM 15 MG PO TABS: 15.0000 mg | ORAL_TABLET | Freq: Every day | ORAL | Status: DC

## 2011-04-18 NOTE — Patient Instructions (Addendum)
Take all new medications as prescribed Continue all other medications as before  

## 2011-04-18 NOTE — Progress Notes (Signed)
Subjective:    Patient ID: Joseph Villa, male    DOB: 03/25/1941, 70 y.o.   MRN: [id number]  HPI  Here with acute onset pain (has chronic pain to LLE from 2007), has known left knee hip DJD and LS spine DJD by films per pt;  Has c/o left groin pain recetnly inreased in the past wk, mild to mod,  and urged by family to be seen, has been doing some excercises with the hip adductors at the gym with excacerbation of the pain, worried about needing hip replacement., but overall pain improved since he has stopped the excercises.  Also with recent hemorrhoid itching recent without blood, minor discomfort.  Also worried long term use of tegretol might cause organ damage, father had renal failure with lithium for bipolar.    Here to f/u; overall doing ok,  Pt denies chest pain, increased sob or doe, wheezing, orthopnea, PND, increased LE swelling, palpitations, dizziness or syncope.  Pt denies new neurological symptoms such as new headache, or facial or extremity weakness or numbness   Pt denies polydipsia, polyuria, or low sugar symptoms such as weakness or confusion improved with po intake.  Pt states overall good compliance with meds, trying to follow lower cholesterol, diabetic diet, wt overall down ove r10 lbs Past Medical History  Diagnosis Date  . ALLERGIC ASTHMA 11/25/2007  . ALLERGIC RHINITIS 11/25/2007  . BIPOLAR DISORDER UNSPECIFIED 10/06/2009  . COLONIC POLYPS, HX OF 10/06/2009  . DM w/o Complication Type II 123456  . GERD 10/06/2009  . HYPERLIPIDEMIA 10/06/2009  . HYPERTENSION, BENIGN 07/27/2009  . HYPOTHYROIDISM 10/06/2009  . NEPHROLITHIASIS, HX OF 10/06/2009  . OBSTRUCTIVE SLEEP APNEA 11/25/2007  . DJD (degenerative joint disease)   . Degenerative arthritis of hip 04/18/2011  . Arthritis, lumbar spine 04/18/2011   Past Surgical History  Procedure Date  . Hernia repair   . Ganglion cyst excision   . Goiter   . Tonsillectomy     reports that he has quit smoking. He does not have any  smokeless tobacco history on file. He reports that he does not drink alcohol or use illicit drugs. family history includes Alcohol abuse in his other and Cancer in his mother. Allergies  Allergen Reactions  . Cefprozil     REACTION: unknown  . Cefuroxime Axetil     REACTION: anaphylaxis  . Rabeprazole Sodium     REACTION: nausea   Current Outpatient Prescriptions on File Prior to Visit  Medication Sig Dispense Refill  . ACCU-CHEK COMFORT CURVE test strip TWICE DAILY  100 each  1  . ACCU-CHEK FASTCLIX LANCETS MISC TWICE DAILY  102 each  6  . albuterol (PROAIR HFA) 108 (90 BASE) MCG/ACT inhaler Inhale into the lungs 4 (four) times daily.        Marland Kitchen BENICAR 20 MG tablet TAKE ONE TABLET EVERY DAY  30 tablet  10  . carbamazepine (TEGRETOL) 200 MG tablet Take 400 mg by mouth at bedtime.        . fluticasone (FLONASE) 50 MCG/ACT nasal spray Place 1 spray into the nose daily.        . Fluticasone-Salmeterol (ADVAIR DISKUS) 250-50 MCG/DOSE AEPB Inhale 1 puff into the lungs every 12 (twelve) hours.        . hydrochlorothiazide 25 MG tablet Take 25 mg by mouth daily.        Marland Kitchen levothyroxine (SYNTHROID, LEVOTHROID) 100 MCG tablet Take 100 mcg by mouth daily.        . pantoprazole (  PROTONIX) 40 MG tablet Take 40 mg by mouth daily.          Review of Systems Review of Systems  Constitutional: Negative for diaphoresis and unexpected weight change.  HENT: Negative for drooling and tinnitus.   Eyes: Negative for photophobia and visual disturbance.  Respiratory: Negative for choking and stridor.   Gastrointestinal: Negative for vomiting and blood in stool.  Genitourinary: Negative for hematuria and decreased urine volume.    Objective:   Physical Exam BP 126/60  Pulse 78  Temp(Src) 98.5 F (36.9 C) (Oral)  Ht 5' 11.5" (1.816 m)  Wt 249 lb 2 oz (113.002 kg)  BMI 34.26 kg/m2  SpO2 97% Physical Exam  VS noted Constitutional: Pt appears well-developed and well-nourished.  HENT: Head:  Normocephalic.  Right Ear: External ear normal.  Left Ear: External ear normal.  Eyes: Conjunctivae and EOM are normal. Pupils are equal, round, and reactive to light.  Neck: Normal range of motion. Neck supple.  Cardiovascular: Normal rate and regular rhythm.   Pulmonary/Chest: Effort normal and breath sounds normal.  Abd:  Soft, NT, non-distended, + BS Neurological: Pt is alert. No cranial nerve deficit.  Skin: Skin is warm. No erythema.  Psychiatric: Pt behavior is normal. Thought content normal.  Left hip with mild discomfort with FROM flexion, extnesion    Assessment & Plan:

## 2011-04-18 NOTE — Assessment & Plan Note (Signed)
stable overall by hx and exam, most recent data reviewed with pt, and pt to continue medical treatment as before, cont wt loss, cont diet effort as well ,m f/u labs next visit  Lab Results  Component Value Date   HGBA1C 7.4* 12/24/2010

## 2011-04-21 ENCOUNTER — Encounter: Payer: Self-pay | Admitting: Internal Medicine

## 2011-04-21 NOTE — Assessment & Plan Note (Signed)
With mild pain, for nsaid prn,  to f/u any worsening symptoms or concerns, does not need ortho eval or hip replacement at this time, pt reassured

## 2011-04-21 NOTE — Assessment & Plan Note (Signed)
stable overall by hx and exam, most recent data reviewed with pt, and pt to continue medical treatment as before BP Readings from Last 3 Encounters:  04/18/11 126/60  12/28/10 130/62  06/28/10 130/62

## 2011-04-21 NOTE — Assessment & Plan Note (Signed)
For topical tx asd,  to f/u any worsening symptoms or concerns

## 2011-04-23 ENCOUNTER — Ambulatory Visit (INDEPENDENT_AMBULATORY_CARE_PROVIDER_SITE_OTHER)
Admission: RE | Admit: 2011-04-23 | Discharge: 2011-04-23 | Disposition: A | Discharge status: Home / Self Care | Payer: Medicare Other | Source: Ambulatory Visit | Attending: Internal Medicine | Admitting: Internal Medicine

## 2011-04-23 ENCOUNTER — Ambulatory Visit (INDEPENDENT_AMBULATORY_CARE_PROVIDER_SITE_OTHER): Payer: Medicare Other | Admitting: Internal Medicine

## 2011-04-23 ENCOUNTER — Encounter: Payer: Self-pay | Admitting: Internal Medicine

## 2011-04-23 VITALS — BP 130/78 | HR 68 | Ht 70.5 in | Wt 254.8 lb

## 2011-04-23 DIAGNOSIS — G4733 Obstructive sleep apnea (adult) (pediatric): Secondary | ICD-10-CM

## 2011-04-23 DIAGNOSIS — J45909 Unspecified asthma, uncomplicated: Secondary | ICD-10-CM

## 2011-04-23 DIAGNOSIS — J309 Allergic rhinitis, unspecified: Secondary | ICD-10-CM

## 2011-04-23 MED ORDER — FLUTICASONE PROPIONATE 50 MCG/ACT NA SUSP: 1.0000 | Freq: Every day | NASAL | Status: DC

## 2011-04-23 MED ORDER — AZITHROMYCIN 250 MG PO TABS: ORAL_TABLET | ORAL | Status: AC

## 2011-04-23 MED ORDER — ALBUTEROL SULFATE HFA 108 (90 BASE) MCG/ACT IN AERS: 2.0000 | INHALATION_SPRAY | Freq: Four times a day (QID) | RESPIRATORY_TRACT | Status: DC

## 2011-04-23 NOTE — Patient Instructions (Signed)
Scripts for Zpak to hold, and refills for flonase and albuterol  Continue CPAP at 9  Order CXR- hx asthma, former smoker

## 2011-04-23 NOTE — Progress Notes (Signed)
04/23/11- 70 yoM former smoker, followed for allergic rhinitis, asthma, complicated by DM, Bipolar, GERD LOV-04/24/2010 Has had flu vaccine and TDAP Nightly along motorcycle ride in cold air leaving himself worse. He has done well over the past year, off of allergy vaccine Continues CPAP at 9 CWP for his sleep apnea/Respicare. Asthma causes no problems unless he is in a very dusty environment and even then it is mild. He is no longer using Advair. Rarely uses rescue inhaler. Does use Flonase for rhinitis.  ROS-see HPI Constitutional:   No-   weight loss, night sweats, fevers, chills, fatigue, lassitude. HEENT:   No-  headaches, difficulty swallowing, tooth/dental problems, sore throat,       No-  sneezing, itching, ear ache, nasal congestion, post nasal drip,  CV:  No-   chest pain, orthopnea, PND, swelling in lower extremities, anasarca,                                  dizziness, palpitations Resp: No-   shortness of breath with exertion or at rest.              No-   productive cough,  No non-productive cough,  No- coughing up of blood.              No-   change in color of mucus.  No- wheezing.   Skin: No-   rash or lesions. GI:  No-   heartburn, indigestion, abdominal pain, nausea, vomiting, diarrhea,                 change in bowel habits, loss of appetite GU: No-   dysuria, change in color of urine, no urgency or frequency.  No- flank pain. MS:  No-   joint pain or swelling.  No- decreased range of motion.  No- back pain. Neuro-     nothing unusual Psych:  No- change in mood or affect. No depression or anxiety.  No memory loss.  OBJ General- Alert, Oriented, Affect-appropriate, Distress- none acute, mild hoarseness. Skin- rash-none, lesions- none, excoriation- none Lymphadenopathy- none Head- atraumatic            Eyes- Gross vision intact, PERRLA, conjunctivae clear secretions            Ears- Hearing, canals-normal            Nose- Clear, no-Septal dev, mucus, polyps, erosion,  perforation             Throat- Mallampati II , mucosa clear , drainage- none, tonsils- atrophic Neck- flexible , trachea midline, no stridor , thyroid nl, carotid no bruit Chest - symmetrical excursion , unlabored           Heart/CV- RRR , no murmur , no gallop  , no rub, nl s1 s2                           - JVD- none , edema- none, stasis changes- none, varices- none           Lung- clear to P&A, wheeze- none, cough- none , dullness-none, rub- none           Chest wall-  Abd- tender-no, distended-no, bowel sounds-present, HSM- no Br/ Gen/ Rectal- Not done, not indicated Extrem- cyanosis- none, clubbing, none, atrophy- none, strength- nl Neuro- grossly intact to observation

## 2011-04-27 NOTE — Assessment & Plan Note (Signed)
Control is generally quite good. We are refilling albuterol and he asks to keep a Z-Pak on hold. Because of remote smoking history we will check chest x-ray.

## 2011-04-27 NOTE — Assessment & Plan Note (Signed)
He continues to find Flonase useful and sufficient. We are refilling that after discussion.

## 2011-04-27 NOTE — Assessment & Plan Note (Signed)
Good compliance and control with adjustment needed.

## 2011-05-06 NOTE — Progress Notes (Signed)
Quick Note:  LMTCB ______ 

## 2011-05-09 NOTE — Progress Notes (Signed)
Quick Note:  Pt aware of results. ______ 

## 2011-06-10 ENCOUNTER — Telehealth: Payer: Self-pay

## 2011-06-10 DIAGNOSIS — M25552 Pain in left hip: Secondary | ICD-10-CM

## 2011-06-10 MED ORDER — MELOXICAM 15 MG PO TABS: 15.0000 mg | ORAL_TABLET | Freq: Every day | ORAL | Status: DC

## 2011-06-10 NOTE — Telephone Encounter (Signed)
Patient is requesting #90 of Meloxicam.

## 2011-07-01 ENCOUNTER — Other Ambulatory Visit (INDEPENDENT_AMBULATORY_CARE_PROVIDER_SITE_OTHER): Payer: Medicare Other

## 2011-07-01 DIAGNOSIS — I10 Essential (primary) hypertension: Secondary | ICD-10-CM

## 2011-07-01 DIAGNOSIS — Z0000 Encounter for general adult medical examination without abnormal findings: Secondary | ICD-10-CM

## 2011-07-01 DIAGNOSIS — Z125 Encounter for screening for malignant neoplasm of prostate: Secondary | ICD-10-CM

## 2011-07-01 DIAGNOSIS — E119 Type 2 diabetes mellitus without complications: Secondary | ICD-10-CM

## 2011-07-01 LAB — CBC WITH DIFFERENTIAL/PLATELET
Basophils Relative: 0.5 % (ref 0.0–3.0)
Eosinophils Absolute: 0.3 10*3/uL (ref 0.0–0.7)
Eosinophils Relative: 4.1 % (ref 0.0–5.0)
Lymphocytes Relative: 21.2 % (ref 12.0–46.0)
Neutrophils Relative %: 66.4 % (ref 43.0–77.0)
Platelets: 197 10*3/uL (ref 150.0–400.0)
RBC: 4.7 Mil/uL (ref 4.22–5.81)
WBC: 7.9 10*3/uL (ref 4.5–10.5)

## 2011-07-01 LAB — LIPID PANEL
Cholesterol: 226 mg/dL — ABNORMAL HIGH (ref 0–200)
VLDL: 38.6 mg/dL (ref 0.0–40.0)

## 2011-07-01 LAB — LDL CHOLESTEROL, DIRECT: Direct LDL: 150.7 mg/dL

## 2011-07-01 LAB — MICROALBUMIN / CREATININE URINE RATIO: Microalb Creat Ratio: 1 mg/g (ref 0.0–30.0)

## 2011-07-01 LAB — URINALYSIS, ROUTINE W REFLEX MICROSCOPIC
Hgb urine dipstick: NEGATIVE
Leukocytes, UA: NEGATIVE
Nitrite: NEGATIVE
Total Protein, Urine: NEGATIVE
pH: 5.5 (ref 5.0–8.0)

## 2011-07-01 LAB — BASIC METABOLIC PANEL
BUN: 27 mg/dL — ABNORMAL HIGH (ref 6–23)
CO2: 27 mEq/L (ref 19–32)
Chloride: 102 mEq/L (ref 96–112)
Creatinine, Ser: 1.2 mg/dL (ref 0.4–1.5)
Glucose, Bld: 122 mg/dL — ABNORMAL HIGH (ref 70–99)

## 2011-07-01 LAB — HEPATIC FUNCTION PANEL
ALT: 33 U/L (ref 0–53)
Albumin: 4.3 g/dL (ref 3.5–5.2)
Total Protein: 7.4 g/dL (ref 6.0–8.3)

## 2011-07-01 LAB — HEMOGLOBIN A1C: Hgb A1c MFr Bld: 6.2 % (ref 4.6–6.5)

## 2011-07-01 LAB — TSH: TSH: 5.24 u[IU]/mL (ref 0.35–5.50)

## 2011-07-05 ENCOUNTER — Encounter: Payer: Self-pay | Admitting: Internal Medicine

## 2011-07-05 ENCOUNTER — Ambulatory Visit (INDEPENDENT_AMBULATORY_CARE_PROVIDER_SITE_OTHER): Payer: Medicare Other | Admitting: Internal Medicine

## 2011-07-05 ENCOUNTER — Other Ambulatory Visit: Payer: Self-pay | Admitting: Internal Medicine

## 2011-07-05 VITALS — BP 122/70 | HR 66 | Temp 97.4°F | Ht 71.5 in | Wt 256.1 lb

## 2011-07-05 DIAGNOSIS — E119 Type 2 diabetes mellitus without complications: Secondary | ICD-10-CM

## 2011-07-05 DIAGNOSIS — M25559 Pain in unspecified hip: Secondary | ICD-10-CM

## 2011-07-05 DIAGNOSIS — I10 Essential (primary) hypertension: Secondary | ICD-10-CM

## 2011-07-05 DIAGNOSIS — Z0000 Encounter for general adult medical examination without abnormal findings: Secondary | ICD-10-CM

## 2011-07-05 DIAGNOSIS — M25552 Pain in left hip: Secondary | ICD-10-CM

## 2011-07-05 MED ORDER — MELOXICAM 15 MG PO TABS: 15.0000 mg | ORAL_TABLET | Freq: Every day | ORAL | Status: DC

## 2011-07-05 MED ORDER — GLUCOSE BLOOD VI STRP: ORAL_STRIP | Status: DC

## 2011-07-05 MED ORDER — CARBAMAZEPINE 200 MG PO TABS: 400.0000 mg | ORAL_TABLET | Freq: Every day | ORAL | Status: DC

## 2011-07-05 MED ORDER — ALBUTEROL SULFATE HFA 108 (90 BASE) MCG/ACT IN AERS: 2.0000 | INHALATION_SPRAY | Freq: Four times a day (QID) | RESPIRATORY_TRACT | Status: DC

## 2011-07-05 MED ORDER — FLUTICASONE PROPIONATE 50 MCG/ACT NA SUSP: 1.0000 | Freq: Every day | NASAL | Status: DC

## 2011-07-05 MED ORDER — ACCU-CHEK FASTCLIX LANCETS MISC: 1.0000 "application " | Freq: Every day | Status: DC

## 2011-07-05 MED ORDER — HYDROCHLOROTHIAZIDE 25 MG PO TABS: 25.0000 mg | ORAL_TABLET | Freq: Every day | ORAL | Status: DC

## 2011-07-05 MED ORDER — PANTOPRAZOLE SODIUM 40 MG PO TBEC: 40.0000 mg | DELAYED_RELEASE_TABLET | Freq: Every day | ORAL | Status: DC

## 2011-07-05 MED ORDER — LEVOTHYROXINE SODIUM 100 MCG PO TABS: 100.0000 ug | ORAL_TABLET | Freq: Every day | ORAL | Status: DC

## 2011-07-05 MED ORDER — OLMESARTAN MEDOXOMIL 20 MG PO TABS: 20.0000 mg | ORAL_TABLET | Freq: Every day | ORAL | Status: DC

## 2011-07-05 NOTE — Patient Instructions (Addendum)
Please follow lower cholesterol diet Continue all other medications as before Your refills were done today Your EKG was good today You will be contacted regarding the referral for: MRI for the left hip, and consider referral to Dr [place]/orhto Please return in 6 mo with Lab testing done 3-5 days before

## 2011-07-06 ENCOUNTER — Encounter: Payer: Self-pay | Admitting: Internal Medicine

## 2011-07-06 NOTE — Assessment & Plan Note (Signed)

## 2011-07-06 NOTE — Assessment & Plan Note (Signed)
To cont mobic, but overall worse symptoms, for MRI left hip and refer ortho, may need ? surgury

## 2011-07-06 NOTE — Progress Notes (Signed)
Subjective:    Patient ID: Joseph Villa, male    DOB: 12/05/1940, 71 y.o.   MRN: [id number]  HPI  Here for wellness and f/u;  Overall doing ok;  Pt denies CP, worsening SOB, DOE, wheezing, orthopnea, PND, worsening LE edema, palpitations, dizziness or syncope.  Pt denies neurological change such as new Headache, facial or extremity weakness.  Pt denies polydipsia, polyuria, or low sugar symptoms. Pt states overall good compliance with treatment and medications, good tolerability, and trying to follow lower cholesterol diet.  Pt denies worsening depressive symptoms, suicidal ideation or panic. No fever, wt loss, night sweats, loss of appetite, or other constitutional symptoms.  Pt states good ability with ADL's, low fall risk, home safety reviewed and adequate, no significant changes in hearing or vision, and occasionally active with exercise.  Incidentally with moderat to severe left hip pain, worse in the past 2-3 months, limps to walk, pain radiates to the groin, worse to walk, better to sit, no fever.  Had plain film per ortho a few yrs ago,siad was told DJD at that time seemed mod to severe.   Past Medical History  Diagnosis Date  . ALLERGIC ASTHMA 11/25/2007  . ALLERGIC RHINITIS 11/25/2007  . BIPOLAR DISORDER UNSPECIFIED 10/06/2009  . COLONIC POLYPS, HX OF 10/06/2009  . DM w/o Complication Type II 123456  . GERD 10/06/2009  . HYPERLIPIDEMIA 10/06/2009  . HYPERTENSION, BENIGN 07/27/2009  . HYPOTHYROIDISM 10/06/2009  . NEPHROLITHIASIS, HX OF 10/06/2009  . OBSTRUCTIVE SLEEP APNEA 11/25/2007  . DJD (degenerative joint disease)   . Degenerative arthritis of hip 04/18/2011  . Arthritis, lumbar spine 04/18/2011   Past Surgical History  Procedure Date  . Hernia repair   . Ganglion cyst excision   . Goiter   . Tonsillectomy     reports that he has quit smoking. He does not have any smokeless tobacco history on file. He reports that he does not drink alcohol or use illicit drugs. family history  includes Alcohol abuse in his other and Cancer in his mother. Allergies  Allergen Reactions  . Cefprozil     REACTION: unknown  . Cefuroxime Axetil     REACTION: anaphylaxis  . Rabeprazole Sodium     REACTION: nausea   Current Outpatient Prescriptions on File Prior to Visit  Medication Sig Dispense Refill  . aspirin 81 MG tablet Take 81 mg by mouth daily.        . cholecalciferol (VITAMIN D) 1000 UNITS tablet Take 1,000 Units by mouth daily.        Marland Kitchen desonide (DESOWEN) 0.05 % lotion Use as directed      . glucosamine-chondroitin 500-400 MG tablet Take 1 tablet by mouth daily.        . Multiple Vitamin (MULTIVITAMIN) tablet Take 1 tablet by mouth daily.         Review of Systems Review of Systems  Constitutional: Negative for diaphoresis, activity change, appetite change and unexpected weight change.  HENT: Negative for hearing loss, ear pain, facial swelling, mouth sores and neck stiffness.   Eyes: Negative for pain, redness and visual disturbance.  Respiratory: Negative for shortness of breath and wheezing.   Cardiovascular: Negative for chest pain and palpitations.  Gastrointestinal: Negative for diarrhea, blood in stool, abdominal distention and rectal pain.  Genitourinary: Negative for hematuria, flank pain and decreased urine volume.  Musculoskeletal: Negative for myalgias and joint swelling.  Skin: Negative for color change and wound.  Neurological: Negative for syncope and numbness.  Hematological:  Negative for adenopathy.  Psychiatric/Behavioral: Negative for hallucinations, self-injury, decreased concentration and agitation.      Objective:   Physical Exam BP 122/70  Pulse 66  Temp(Src) 97.4 F (36.3 C) (Oral)  Ht 5' 11.5" (1.816 m)  Wt 256 lb 2 oz (116.178 kg)  BMI 35.22 kg/m2  SpO2 95% Physical Exam  VS noted Constitutional: Pt is oriented to person, place, and time. Appears well-developed and well-nourished.  HENT:  Head: Normocephalic and atraumatic.    Right Ear: External ear normal.  Left Ear: External ear normal.  Nose: Nose normal.  Mouth/Throat: Oropharynx is clear and moist.  Eyes: Conjunctivae and EOM are normal. Pupils are equal, round, and reactive to light.  Neck: Normal range of motion. Neck supple. No JVD present. No tracheal deviation present.  Cardiovascular: Normal rate, regular rhythm, normal heart sounds and intact distal pulses.   Pulmonary/Chest: Effort normal and breath sounds normal.  Abdominal: Soft. Bowel sounds are normal. There is no tenderness.  Musculoskeletal: Normal range of motion. Exhibits no edema.  Lymphadenopathy:  Has no cervical adenopathy.  Neurological: Pt is alert and oriented to person, place, and time. Pt has normal reflexes. No cranial nerve deficit.  Skin: Skin is warm and dry. No rash noted.  Left hip with marked tender on ROM, worse to  Internal rotation Psychiatric:  Has  normal mood and affect. Behavior is normal.     Assessment & Plan:

## 2011-07-06 NOTE — Assessment & Plan Note (Signed)
stable overall by hx and exam, most recent data reviewed with pt, and pt to continue medical treatment as before  BP Readings from Last 3 Encounters:  07/05/11 122/70  04/23/11 130/78  04/18/11 126/60

## 2011-07-06 NOTE — Assessment & Plan Note (Signed)
stable overall by hx and exam, most recent data reviewed with pt, and pt to continue medical treatment as before  Lab Results  Component Value Date   HGBA1C 6.2 07/01/2011

## 2011-07-08 ENCOUNTER — Telehealth: Payer: Self-pay

## 2011-07-08 MED ORDER — CARBAMAZEPINE 200 MG PO TABS: 400.0000 mg | ORAL_TABLET | Freq: Every day | ORAL | Status: DC

## 2011-07-08 NOTE — Telephone Encounter (Signed)
Pharmacy would like to know if patient can have 180 tablets for a 90 day supply of Carbamazepine. Please advise

## 2011-07-08 NOTE — Telephone Encounter (Signed)
rx corrected and re-sent per emr

## 2011-07-14 ENCOUNTER — Encounter: Payer: Self-pay | Admitting: Internal Medicine

## 2011-07-14 ENCOUNTER — Ambulatory Visit
Admission: RE | Admit: 2011-07-14 | Discharge: 2011-07-14 | Disposition: A | Discharge status: Home / Self Care | Payer: Medicare Other | Source: Ambulatory Visit | Attending: Internal Medicine | Admitting: Internal Medicine

## 2011-07-14 DIAGNOSIS — M25552 Pain in left hip: Secondary | ICD-10-CM

## 2011-07-15 ENCOUNTER — Telehealth: Payer: Self-pay

## 2011-07-15 NOTE — Telephone Encounter (Signed)
The patient would like a prescription for diabetic shoes, please advise

## 2011-07-15 NOTE — Telephone Encounter (Signed)
I can refer to podiatry, who really are the best at prescribing the correct shoes; please let me know

## 2011-07-16 NOTE — Telephone Encounter (Signed)
Informed of MD's recommendations. The patient does have a podiatrist and he will call to schedule with that MD.

## 2011-07-17 ENCOUNTER — Telehealth: Payer: Self-pay

## 2011-07-17 DIAGNOSIS — F22 Delusional disorders: Secondary | ICD-10-CM

## 2011-07-17 DIAGNOSIS — M25552 Pain in left hip: Secondary | ICD-10-CM

## 2011-07-17 NOTE — Telephone Encounter (Signed)
Done per emr 

## 2011-07-17 NOTE — Telephone Encounter (Signed)
The patient called with the name of Ortho. He would like to be referred to for his hip. Dr. Wynelle Link at [facility]. Please advise

## 2011-08-08 ENCOUNTER — Telehealth: Payer: Self-pay | Admitting: Internal Medicine

## 2011-08-08 MED ORDER — CLARITHROMYCIN 500 MG PO TABS: ORAL_TABLET | ORAL | Status: DC

## 2011-08-08 MED ORDER — HYDROCODONE-HOMATROPINE 5-1.5 MG/5ML PO SYRP: ORAL_SOLUTION | ORAL | Status: DC

## 2011-08-08 NOTE — Telephone Encounter (Signed)
Pt returned triage's call & asked to be reached at [phone].  Joseph Villa

## 2011-08-08 NOTE — Telephone Encounter (Signed)
Called spoke with patient who reported his finished zpak 1 week ago for sinus infection.  Now c/o prod cough with clear mucus, sore throat, lungs hurt with coughing x3 days - denies wheezing, SOB, f/c/s.  Requesting CDY's recs and some cough syrup.  Scherrie November.  No appt necessary per pt.  Dr Annamaria Boots please advise, thanks.  Allergies  Allergen Reactions  . Cefprozil     REACTION: unknown  . Cefuroxime Axetil     REACTION: anaphylaxis  . Rabeprazole Sodium     REACTION: nausea

## 2011-08-08 NOTE — Telephone Encounter (Signed)
PER CY BIAXIN 500 MG 1 BID AFTER MEALS # 14  HYDROMET cS #200 ML 1 TSP Q 6 HOURS PRN COUGH  CALLED TO [facility]   PT IS AWARE

## 2011-08-08 NOTE — Telephone Encounter (Signed)
Last seen by CDY 11.20.12, upcoming 5.9.13.  LMOM TCB x1.

## 2011-10-10 ENCOUNTER — Ambulatory Visit: Payer: Medicare Other | Admitting: Internal Medicine

## 2011-11-15 ENCOUNTER — Ambulatory Visit (INDEPENDENT_AMBULATORY_CARE_PROVIDER_SITE_OTHER)
Admission: RE | Admit: 2011-11-15 | Discharge: 2011-11-15 | Disposition: A | Discharge status: Home / Self Care | Payer: Medicare Other | Source: Ambulatory Visit | Attending: Internal Medicine | Admitting: Internal Medicine

## 2011-11-15 ENCOUNTER — Ambulatory Visit (INDEPENDENT_AMBULATORY_CARE_PROVIDER_SITE_OTHER): Payer: Medicare Other | Admitting: Internal Medicine

## 2011-11-15 ENCOUNTER — Encounter: Payer: Self-pay | Admitting: Internal Medicine

## 2011-11-15 VITALS — BP 114/60 | HR 67 | Ht 70.5 in | Wt 254.8 lb

## 2011-11-15 DIAGNOSIS — J8410 Pulmonary fibrosis, unspecified: Secondary | ICD-10-CM

## 2011-11-15 DIAGNOSIS — G4733 Obstructive sleep apnea (adult) (pediatric): Secondary | ICD-10-CM

## 2011-11-15 DIAGNOSIS — J45909 Unspecified asthma, uncomplicated: Secondary | ICD-10-CM

## 2011-11-15 DIAGNOSIS — J849 Interstitial pulmonary disease, unspecified: Secondary | ICD-10-CM

## 2011-11-15 NOTE — Progress Notes (Signed)
04/23/11- 70 yoM former smoker, followed for allergic rhinitis, asthma, complicated by DM, Bipolar, GERD LOV-04/24/2010 Has had flu vaccine and TDAP He has done well over the past year, off of allergy vaccine Continues CPAP at 9 CWP for his sleep apnea/Respicare. Asthma causes no problems unless he is in a very dusty environment and even then it is mild. He is no longer using Advair. Rarely uses rescue inhaler. Does use Flonase for rhinitis.  11/15/11- 70 yoM former smoker, followed for allergic rhinitis, asthma, complicated by DM, Bipolar, GERD Sleep apnea control remains good at 9 CWP/ Respicare. Uses chin strap. Still gets dry mouth if lying on his back. Dyspnea with exertion is stable and chronic. No cough. 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 CXR 05/09/11- reviewed with him IMPRESSION:  1. Some interval increase in linear lingular scarring or  atelectasis.  Original Report Authenticated By: Dillard Cannon III, M.D.   ROS-see HPI Constitutional:   No-   weight loss, night sweats, fevers, chills, fatigue, lassitude. HEENT:   No-  headaches, difficulty swallowing, tooth/dental problems, sore throat,       No-  sneezing, itching, ear ache, nasal congestion, post nasal drip,  CV:  No-   chest pain, orthopnea, PND, swelling in lower extremities, anasarca, dizziness, palpitations Resp: + shortness of breath with exertion or at rest.              No-   productive cough,  No non-productive cough,  No- coughing up of blood.              No-   change in color of mucus.  No- wheezing.   Skin: No-   rash or lesions. GI:  No-   heartburn, indigestion, abdominal pain, nausea, vomiting,  GU: . MS:  No-   joint pain or swelling.  Neuro-     nothing unusual Psych:  No- change in mood or affect. No depression or anxiety.  No memory loss.  OBJ General- Alert, Oriented, Affect-appropriate, Distress- none acute, mild hoarseness.  Obese Skin- rash-none, lesions- none, excoriation- none Lymphadenopathy- none Head- atraumatic            Eyes- Gross vision intact, PERRLA, conjunctivae clear secretions            Ears- Hearing, canals-normal            Nose- Clear, no-Septal dev, mucus, polyps, erosion, perforation             Throat- Mallampati II , mucosa clear , drainage- none, tonsils- atrophic Neck- flexible , trachea midline, no stridor , thyroid nl, carotid no bruit Chest - symmetrical excursion , unlabored           Heart/CV- RRR , no murmur , no gallop  , no rub, nl s1 s2                           - JVD- none , edema- none, stasis changes- none, varices- none           Lung- clear to P&A, wheeze- none, cough- none , dullness-none, rub- none           Chest wall-  Abd-  Br/ Gen/ Rectal- Not done, not indicated Extrem- cyanosis- none, clubbing, none, atrophy- none, strength- nl Neuro- grossly intact to observation

## 2011-11-15 NOTE — Patient Instructions (Signed)
Order- CXR dx interstitial lung disease  Continue CPAP 9

## 2011-11-21 ENCOUNTER — Telehealth: Payer: Self-pay | Admitting: Internal Medicine

## 2011-11-21 NOTE — Telephone Encounter (Signed)
Notes Recorded by Deneise Lever, MD on 11/15/2011 at 5:15 PM Mild chronic bronchitis changes and some old scarring. No significant progressive process seen. Ok to follow over time  I spoke with patient about results and he verbalized understanding and had no questions   Pt states he does not want to switch MD's bc he wants to stay with [facility].

## 2011-11-23 NOTE — Assessment & Plan Note (Signed)
Continued good compliance and control. His pressure is adequate. We discussed a dry mouth, suggesting he turn up his humidifier and try Biotene.

## 2011-11-23 NOTE — Assessment & Plan Note (Addendum)
He describes fairly heavy long-term exposure to fiberglass dust, although he was wearing a respirator mask. We need to watch his dry cough and interstitial markings on chest x-ray for evidence of a progressive interstitial lung disease. Plan-chest x-ray

## 2011-12-17 ENCOUNTER — Other Ambulatory Visit (INDEPENDENT_AMBULATORY_CARE_PROVIDER_SITE_OTHER): Payer: Medicare Other

## 2011-12-17 DIAGNOSIS — E119 Type 2 diabetes mellitus without complications: Secondary | ICD-10-CM

## 2011-12-17 LAB — LIPID PANEL
HDL: 49.3 mg/dL (ref >39.00)
Total CHOL/HDL Ratio: 4
Triglycerides: 254 mg/dL — ABNORMAL HIGH (ref 0.0–149.0)
VLDL: 50.8 mg/dL — ABNORMAL HIGH (ref 0.0–40.0)

## 2011-12-17 LAB — BASIC METABOLIC PANEL
Calcium: 9.7 mg/dL (ref 8.4–10.5)
GFR: 57.21 mL/min — ABNORMAL LOW (ref >60.00)
Potassium: 4.2 mEq/L (ref 3.5–5.1)
Sodium: 141 mEq/L (ref 135–145)

## 2011-12-17 LAB — LDL CHOLESTEROL, DIRECT: Direct LDL: 125.4 mg/dL

## 2011-12-20 ENCOUNTER — Ambulatory Visit (INDEPENDENT_AMBULATORY_CARE_PROVIDER_SITE_OTHER): Payer: Medicare Other | Admitting: Internal Medicine

## 2011-12-20 ENCOUNTER — Encounter: Payer: Self-pay | Admitting: Internal Medicine

## 2011-12-20 VITALS — BP 120/62 | HR 66 | Temp 99.6°F | Ht 71.0 in | Wt 256.1 lb

## 2011-12-20 DIAGNOSIS — G609 Hereditary and idiopathic neuropathy, unspecified: Secondary | ICD-10-CM

## 2011-12-20 DIAGNOSIS — F411 Generalized anxiety disorder: Secondary | ICD-10-CM

## 2011-12-20 DIAGNOSIS — F419 Anxiety disorder, unspecified: Secondary | ICD-10-CM

## 2011-12-20 DIAGNOSIS — E119 Type 2 diabetes mellitus without complications: Secondary | ICD-10-CM

## 2011-12-20 DIAGNOSIS — E785 Hyperlipidemia, unspecified: Secondary | ICD-10-CM

## 2011-12-20 DIAGNOSIS — G629 Polyneuropathy, unspecified: Secondary | ICD-10-CM

## 2011-12-20 DIAGNOSIS — Z0000 Encounter for general adult medical examination without abnormal findings: Secondary | ICD-10-CM

## 2011-12-20 MED ORDER — CITALOPRAM HYDROBROMIDE 10 MG PO TABS: 10.0000 mg | ORAL_TABLET | Freq: Every day | ORAL | Status: DC

## 2011-12-20 NOTE — Patient Instructions (Addendum)
Take all new medications as prescribed  - the citalopram 10 mg per day for stress Continue all other medications as before You are given the prescription for Diabetic shoes today You should see a podiatrist to actually order the shoes Please continue your efforts at being more active, low cholesterol diet, and weight control. Please return in 6 mo with Lab testing done 3-5 days before

## 2011-12-21 ENCOUNTER — Encounter: Payer: Self-pay | Admitting: Internal Medicine

## 2011-12-21 DIAGNOSIS — G629 Polyneuropathy, unspecified: Secondary | ICD-10-CM | POA: Insufficient documentation

## 2011-12-21 NOTE — Assessment & Plan Note (Signed)

## 2011-12-21 NOTE — Assessment & Plan Note (Signed)
Clinical dx, mild, no painful, ok for DM shoes,  to f/u any worsening symptoms or concerns

## 2011-12-21 NOTE — Assessment & Plan Note (Signed)
Declines statin, to cont diet,  to f/u any worsening symptoms or concerns

## 2011-12-21 NOTE — Progress Notes (Signed)
Subjective:    Patient ID: Joseph Villa, male    DOB: Oct 24, 1940, 71 y.o.   MRN: [id number]  HPI  Here for wellness and f/u;  Overall doing ok;  Pt denies CP, worsening SOB, DOE, wheezing, orthopnea, PND, worsening LE edema, palpitations, dizziness or syncope.  Pt denies neurological change such as new Headache, facial or extremity weakness.  Pt denies polydipsia, polyuria, or low sugar symptoms. Pt states overall good compliance with treatment and medications, good tolerability, and trying to follow lower cholesterol diet.  Pt denies worsening depressive symptoms, suicidal ideation or panic. No fever, wt loss, night sweats, loss of appetite, or other constitutional symptoms.  Pt states good ability with ADL's, low fall risk, home safety reviewed and adequate, no significant changes in hearing or vision, and occasionally active with exercise.  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  Does mention some toe numbness starting in the past yr, no pain or weakness, but is all toes.  CBG's have been in the low 100's.  Does not want more med such as statin today.  Does need DM shoes Past Medical History  Diagnosis Date  . ALLERGIC ASTHMA 11/25/2007  . ALLERGIC RHINITIS 11/25/2007  . BIPOLAR DISORDER UNSPECIFIED 10/06/2009  . COLONIC POLYPS, HX OF 10/06/2009  . DM w/o Complication Type II 123456  . GERD 10/06/2009  . HYPERLIPIDEMIA 10/06/2009  . HYPERTENSION, BENIGN 07/27/2009  . HYPOTHYROIDISM 10/06/2009  . NEPHROLITHIASIS, HX OF 10/06/2009  . OBSTRUCTIVE SLEEP APNEA 11/25/2007  . DJD (degenerative joint disease)   . Degenerative arthritis of hip 04/18/2011  . Arthritis, lumbar spine 04/18/2011   Past Surgical History  Procedure Date  . Hernia repair   . Ganglion cyst excision   . Goiter   . Tonsillectomy     reports that he has quit smoking. He does not have any smokeless tobacco history on file. He reports that he does not drink alcohol or use illicit  drugs. family history includes Alcohol abuse in his other and Cancer in his mother. Allergies  Allergen Reactions  . Cefprozil     REACTION: unknown  . Cefuroxime Axetil     REACTION: anaphylaxis  . Rabeprazole Sodium     REACTION: nausea   Current Outpatient Prescriptions on File Prior to Visit  Medication Sig Dispense Refill  . ACCU-CHEK FASTCLIX LANCETS MISC Apply 1 application topically daily.  102 each  6  . albuterol (PROAIR HFA) 108 (90 BASE) MCG/ACT inhaler Inhale 2 puffs into the lungs 4 (four) times daily.  3 Inhaler  prn  . aspirin 81 MG tablet Take 81 mg by mouth daily.        . carbamazepine (TEGRETOL) 200 MG tablet Take 2 tablets (400 mg total) by mouth at bedtime.  180 tablet  3  . cholecalciferol (VITAMIN D) 1000 UNITS tablet Take 1,000 Units by mouth daily.        Marland Kitchen desonide (DESOWEN) 0.05 % lotion Use as directed      . fluticasone (FLONASE) 50 MCG/ACT nasal spray Place 1 spray into the nose daily.  16 g  prn  . glucosamine-chondroitin 500-400 MG tablet Take 1 tablet by mouth daily.        Marland Kitchen glucose blood (ACCU-CHEK COMFORT CURVE) test strip Use as instructed 1  Per day  250.02  100 each  11  . hydrochlorothiazide (HYDRODIURIL) 25 MG tablet Take 1 tablet (25 mg total) by mouth daily.  90 tablet  3  . levothyroxine (SYNTHROID, LEVOTHROID) 100 MCG tablet Take 1 tablet (100 mcg total) by mouth daily.  90 tablet  3  . meloxicam (MOBIC) 15 MG tablet Take 1 tablet (15 mg total) by mouth daily.  90 tablet  3  . Multiple Vitamin (MULTIVITAMIN) tablet Take 1 tablet by mouth daily.        Marland Kitchen olmesartan (BENICAR) 20 MG tablet Take 1 tablet (20 mg total) by mouth daily.  90 tablet  3  . pantoprazole (PROTONIX) 40 MG tablet Take 1 tablet (40 mg total) by mouth daily.  90 tablet  3  . PROCTOSOL HC 2.5 % rectal cream APPLY RECTALLY TWICE DAILY  28.35 g  1  . citalopram (CELEXA) 10 MG tablet Take 1 tablet (10 mg total) by mouth daily.  90 tablet  3   Review of Systems Review of  Systems  Constitutional: Negative for diaphoresis, activity change, appetite change and unexpected weight change.  HENT: Negative for hearing loss, ear pain, facial swelling, mouth sores and neck stiffness.   Eyes: Negative for pain, redness and visual disturbance.  Respiratory: Negative for shortness of breath and wheezing.   Cardiovascular: Negative for chest pain and palpitations.  Gastrointestinal: Negative for diarrhea, blood in stool, abdominal distention and rectal pain.  Genitourinary: Negative for hematuria, flank pain and decreased urine volume.  Musculoskeletal: Negative for myalgias and joint swelling.  Skin: Negative for color change and wound.  Neurological: Negative for syncope and numbness.  Hematological: Negative for adenopathy.  Psychiatric/Behavioral: Negative for hallucinations, self-injury, decreased concentration and agitation.      Objective:   Physical Exam BP 120/62  Pulse 66  Temp 99.6 F (37.6 C) (Oral)  Ht 5\' 11"  (1.803 m)  Wt 256 lb 2 oz (116.178 kg)  BMI 35.72 kg/m2  SpO2 96% Physical Exam  VS noted Constitutional: Pt is oriented to person, place, and time. Appears well-developed and well-nourished.  HENT:  Head: Normocephalic and atraumatic.  Right Ear: External ear normal.  Left Ear: External ear normal.  Nose: Nose normal.  Mouth/Throat: Oropharynx is clear and moist.  Eyes: Conjunctivae and EOM are normal. Pupils are equal, round, and reactive to light.  Neck: Normal range of motion. Neck supple. No JVD present. No tracheal deviation present.  Cardiovascular: Normal rate, regular rhythm, normal heart sounds and intact distal pulses.   Pulmonary/Chest: Effort normal and breath sounds normal.  Abdominal: Soft. Bowel sounds are normal. There is no tenderness.  Musculoskeletal: Normal range of motion. Exhibits no edema.  Lymphadenopathy:  Has no cervical adenopathy.  Neurological: Pt is alert and oriented to person, place, and time. Pt has normal  reflexes. No cranial nerve deficit. Motor intact, mild decr sens to LT toes Skin: Skin is warm and dry. No rash noted.  Psychiatric:  Has  normal mood and affect. Behavior is normal. 2+ nervous    Assessment & Plan:

## 2011-12-21 NOTE — Assessment & Plan Note (Signed)
stable overall by hx and exam, most recent data reviewed with pt, and pt to continue medical treatment as before Lab Results  Component Value Date   HGBA1C 6.5 12/17/2011

## 2011-12-21 NOTE — Assessment & Plan Note (Signed)
For citalopram 10 qd,  to f/u any worsening symptoms or concerns

## 2012-01-03 ENCOUNTER — Ambulatory Visit: Payer: Medicare Other | Admitting: Internal Medicine

## 2012-01-22 ENCOUNTER — Telehealth: Payer: Self-pay | Admitting: Internal Medicine

## 2012-01-22 DIAGNOSIS — G4733 Obstructive sleep apnea (adult) (pediatric): Secondary | ICD-10-CM

## 2012-01-22 NOTE — Telephone Encounter (Signed)
Spoke with pt. He currently uses [facility] and wants to switch to [facility] for CPAP and supplies. Order was sent to [facility]. Nothing further needed per pt.

## 2012-01-28 ENCOUNTER — Ambulatory Visit: Payer: Medicare Other | Admitting: Internal Medicine

## 2012-02-28 ENCOUNTER — Other Ambulatory Visit: Payer: Self-pay | Admitting: Internal Medicine

## 2012-02-28 NOTE — Telephone Encounter (Signed)
Done erx 

## 2012-03-12 ENCOUNTER — Ambulatory Visit (INDEPENDENT_AMBULATORY_CARE_PROVIDER_SITE_OTHER): Payer: Medicare Other

## 2012-03-12 DIAGNOSIS — Z23 Encounter for immunization: Secondary | ICD-10-CM

## 2012-03-13 DIAGNOSIS — Z23 Encounter for immunization: Secondary | ICD-10-CM

## 2012-03-19 ENCOUNTER — Telehealth: Payer: Self-pay | Admitting: Internal Medicine

## 2012-03-19 MED ORDER — AZITHROMYCIN 250 MG PO TABS: 250.0000 mg | ORAL_TABLET | ORAL | Status: DC

## 2012-03-19 NOTE — Telephone Encounter (Signed)
Phone call from patient c/o prod cough with white/clear phlegm, chest pain that feel like "barbed-wire" in his lungs x 4-5 days. Patient denies fever (97.0), wheezing and SOB. Patient is leaving out Saturday of town x5 days to go to [employer] and wants this taken care of soon so that he does not get worse over the weekend. Patient is requesting an appt if available or CY recs.  Dr Annamaria Boots please advise. Thanks .   Allergies  Allergen Reactions  . Cefprozil     REACTION: unknown  . Cefuroxime Axetil     REACTION: anaphylaxis  . Rabeprazole Sodium     REACTION: nausea

## 2012-03-19 NOTE — Telephone Encounter (Signed)
Spoke with patient in regards to recs per CY, pt experssed understanding and will call if symptoms worsen. Zpak called into [facility].  Zpak #1 take as directed 0-RF. Patient aware this medication called into pharm.

## 2012-03-19 NOTE — Telephone Encounter (Signed)
Per CY-okay to give Zpak #1 take as directed no refills and get Mucinex OTC.

## 2012-04-02 ENCOUNTER — Encounter: Payer: Self-pay | Admitting: Internal Medicine

## 2012-04-23 ENCOUNTER — Telehealth: Payer: Self-pay

## 2012-04-23 NOTE — Telephone Encounter (Signed)
Received surgical clearance form from [facility].  For PCP to complete and clear.   Patient is scheduled for surgical clearance with Dr. Jenny Reichmann on 06/05/11.  Form is in folder on CMA's desk awaiting appointments.

## 2012-05-21 ENCOUNTER — Ambulatory Visit: Payer: Medicare Other | Admitting: Internal Medicine

## 2012-05-22 ENCOUNTER — Ambulatory Visit (INDEPENDENT_AMBULATORY_CARE_PROVIDER_SITE_OTHER)
Admission: RE | Admit: 2012-05-22 | Discharge: 2012-05-22 | Disposition: A | Discharge status: Home / Self Care | Payer: Medicare Other | Source: Ambulatory Visit | Attending: Internal Medicine | Admitting: Internal Medicine

## 2012-05-22 ENCOUNTER — Encounter: Payer: Self-pay | Admitting: Internal Medicine

## 2012-05-22 ENCOUNTER — Ambulatory Visit (INDEPENDENT_AMBULATORY_CARE_PROVIDER_SITE_OTHER): Payer: Medicare Other | Admitting: Internal Medicine

## 2012-05-22 VITALS — BP 120/72 | HR 67 | Ht 70.5 in | Wt 260.8 lb

## 2012-05-22 DIAGNOSIS — J45909 Unspecified asthma, uncomplicated: Secondary | ICD-10-CM

## 2012-05-22 DIAGNOSIS — J8410 Pulmonary fibrosis, unspecified: Secondary | ICD-10-CM

## 2012-05-22 DIAGNOSIS — J45998 Other asthma: Secondary | ICD-10-CM

## 2012-05-22 DIAGNOSIS — G4733 Obstructive sleep apnea (adult) (pediatric): Secondary | ICD-10-CM

## 2012-05-22 MED ORDER — AZITHROMYCIN 250 MG PO TABS: ORAL_TABLET | ORAL | Status: DC

## 2012-05-22 NOTE — Assessment & Plan Note (Addendum)
Good compliance and control with CPAP. He will bring his mask to the hospital for surgery and the hospital will supply his CPAP machine during sleep, set at 9

## 2012-05-22 NOTE — Assessment & Plan Note (Signed)
Currently chest is clear and spirometry normal. He has good pulmonary reserve for anticipated surgery.

## 2012-05-22 NOTE — Progress Notes (Signed)
04/23/11- 70 yoM former smoker, followed for allergic rhinitis, asthma, complicated by DM, Bipolar, GERD LOV-04/24/2010 Has had flu vaccine and TDAP He has done well over the past year, off of allergy vaccine Continues CPAP at 9 CWP for his sleep apnea/[facility]. Asthma causes no problems unless he is in a very dusty environment and even then it is mild. He is no longer using Advair. Rarely uses rescue inhaler. Does use Flonase for rhinitis.  11/15/11- 70 yoM former smoker, followed for allergic rhinitis, asthma, complicated by DM, Bipolar, GERD Sleep apnea control remains good at 9 CWP/ [facility]. Uses chin strap. Still gets dry mouth if lying on his back. Dyspnea with exertion is stable and chronic. No cough. 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 CXR 05/09/11- reviewed with him IMPRESSION:  1. Some interval increase in linear lingular scarring or  atelectasis.  Original Report Authenticated By: Trecia Rogers, M.D.    05/22/12- 71 yoM former smoker, followed for allergic rhinitis, asthma, complicated by DM, Bipolar, GERD FOLLOWS FOR: wears CPAP every night from 1130pm to 12am to 6-7am; still having episodes of waking up and feels like something is going to happen(anxiety) having more stress. Needs surgery clearance for hip surgery CPAP 9/[facility] with good compliance and control. He gets some dry mouth and we discussed the humidifier. He will take his own mask to the hospital her surgery and they will supply the machine. He had been going to a gym regularly 3 times a week for many years, recently limited by his hip pain. Little cough with no wheeze or phlegm. Still notices dyspnea on exertion, probably unchanged over 8-10 years. He says he is short of breath walking one block on level ground but he had been able to exercise hard at the gym. CXR 11/14/11-reviewed and compared with earlier images IMPRESSION:  Chronic left basilar  atelectasis versus scarring.  Mild chronic bronchitic and interstitial lung disease changes.  Original Report Authenticated By: Burnetta Sabin, M.D.  Office Spirometry 05/22/12-normal spirometry. FVC 3.85/83%, FEV1 2.88/81%, FEV1/FVC 0.75, FEF 25-75% 2.17/69%.  ROS-see HPI Constitutional:   No-   weight loss, night sweats, fevers, chills, fatigue, lassitude. HEENT:   No-  headaches, difficulty swallowing, tooth/dental problems, sore throat,       No-  sneezing, itching, ear ache, nasal congestion, post nasal drip,  CV:  No-   chest pain, orthopnea, PND, swelling in lower extremities, anasarca, dizziness, palpitations Resp: + shortness of breath with exertion or at rest.              No-   productive cough,  No non-productive cough,  No- coughing up of blood.              No-   change in color of mucus.  No- wheezing.   Skin: No-   rash or lesions. GI:  No-   heartburn, indigestion, abdominal pain, nausea, vomiting,  GU: . MS:  No-   joint pain or swelling.  Neuro-     nothing unusual Psych:  No- change in mood or affect. No depression or anxiety.  No memory loss.  OBJ BP 120/72  Pulse 67  Ht 5' 10.5" (1.791 m)  Wt 260 lb 12.8 oz (118.298 kg)  BMI 36.89 kg/m2  SpO2 96% room air General- Alert, Oriented, Affect-appropriate, Distress- none acute, mild hoarseness. Heavy set/ muscular Skin- rash-none, lesions- none, excoriation- none Lymphadenopathy- none Head- atraumatic  Eyes- Gross vision intact, PERRLA, conjunctivae clear secretions            Ears- Hearing, canals-normal            Nose- Clear, no-Septal dev, mucus, polyps, erosion, perforation             Throat- Mallampati III , mucosa clear , drainage- none, tonsils present Neck- flexible , trachea midline, no stridor , thyroid nl, carotid no bruit Chest - symmetrical excursion , unlabored           Heart/CV- RRR , no murmur , no gallop  , no rub, nl s1 s2                           - JVD- none , edema- none, stasis  changes- none, varices- none           Lung- clear to P&A, wheeze- none, cough- none , dullness-none, rub- none           Chest wall-  Abd-  Br/ Gen/ Rectal- Not done, not indicated Extrem- cyanosis- none, clubbing, none, atrophy- none, strength- nl Neuro- grossly intact to observation

## 2012-05-22 NOTE — Patient Instructions (Addendum)
Order- DME [facility]- increase CPAP to 10 cwp  Plan on taking your CPAP mask to the hospital when you go, and remind the doctors there that you use CPAP 10 and will need to wear it during sleep.  Office spirometry  Dx allergic asthma  Handicapped parking  Order CXR  Dx asthma, fibrosis

## 2012-06-04 ENCOUNTER — Other Ambulatory Visit (INDEPENDENT_AMBULATORY_CARE_PROVIDER_SITE_OTHER): Payer: Medicare Other

## 2012-06-04 ENCOUNTER — Ambulatory Visit (INDEPENDENT_AMBULATORY_CARE_PROVIDER_SITE_OTHER): Payer: Medicare Other | Admitting: Internal Medicine

## 2012-06-04 ENCOUNTER — Encounter: Payer: Self-pay | Admitting: Internal Medicine

## 2012-06-04 VITALS — BP 140/60 | HR 64 | Temp 98.8°F | Ht 71.0 in | Wt 256.4 lb

## 2012-06-04 DIAGNOSIS — E039 Hypothyroidism, unspecified: Secondary | ICD-10-CM

## 2012-06-04 DIAGNOSIS — Z0000 Encounter for general adult medical examination without abnormal findings: Secondary | ICD-10-CM

## 2012-06-04 DIAGNOSIS — E119 Type 2 diabetes mellitus without complications: Secondary | ICD-10-CM

## 2012-06-04 DIAGNOSIS — I10 Essential (primary) hypertension: Secondary | ICD-10-CM

## 2012-06-04 DIAGNOSIS — Z01818 Encounter for other preprocedural examination: Secondary | ICD-10-CM | POA: Insufficient documentation

## 2012-06-04 LAB — LIPID PANEL
HDL: 43.8 mg/dL (ref >39.00)
LDL Cholesterol: 121 mg/dL — ABNORMAL HIGH (ref 0–99)
Total CHOL/HDL Ratio: 4
Triglycerides: 160 mg/dL — ABNORMAL HIGH (ref 0.0–149.0)

## 2012-06-04 LAB — BASIC METABOLIC PANEL
Calcium: 9.5 mg/dL (ref 8.4–10.5)
Creatinine, Ser: 1.2 mg/dL (ref 0.4–1.5)

## 2012-06-04 LAB — TSH: TSH: 2.88 u[IU]/mL (ref 0.35–5.50)

## 2012-06-04 MED ORDER — ATORVASTATIN CALCIUM 10 MG PO TABS: 10.0000 mg | ORAL_TABLET | Freq: Every day | ORAL | Status: DC

## 2012-06-04 NOTE — Progress Notes (Signed)
Subjective:    Patient ID: Joseph Villa, male    DOB: 04-Aug-1940, 72 y.o.   MRN: [id number]  HPI  Here to f/u; overall doing ok,  Pt denies chest pain, increased sob or doe, wheezing, orthopnea, PND, increased LE swelling, palpitations, dizziness or syncope.  Pt denies new neurological symptoms such as new headache, or facial or extremity weakness or numbness   Pt denies polydipsia, polyuria, or low sugar symptoms such as weakness or confusion improved with po intake.  Pt states overall good compliance with meds, trying to follow lower cholesterol, diabetic diet, wt overall stable but little exercise however.  For left hip surgury jan 14 per Dr Arlington Calix.  Pt denies fever, wt loss, night sweats, loss of appetite, or other constitutional symptoms  Overall good compliance with treatment, and good medicine tolerability.  Tries to go to gym several times per wk Past Medical History  Diagnosis Date  . ALLERGIC ASTHMA 11/25/2007  . ALLERGIC RHINITIS 11/25/2007  . BIPOLAR DISORDER UNSPECIFIED 10/06/2009  . COLONIC POLYPS, HX OF 10/06/2009  . DM w/o Complication Type II 123456  . GERD 10/06/2009  . HYPERLIPIDEMIA 10/06/2009  . HYPERTENSION, BENIGN 07/27/2009  . HYPOTHYROIDISM 10/06/2009  . NEPHROLITHIASIS, HX OF 10/06/2009  . OBSTRUCTIVE SLEEP APNEA 11/25/2007  . DJD (degenerative joint disease)   . Degenerative arthritis of hip 04/18/2011  . Arthritis, lumbar spine 04/18/2011   Past Surgical History  Procedure Date  . Hernia repair   . Ganglion cyst excision   . Goiter   . Tonsillectomy     reports that he has quit smoking. He does not have any smokeless tobacco history on file. He reports that he does not drink alcohol or use illicit drugs. family history includes Alcohol abuse in his other and Cancer in his mother. Allergies  Allergen Reactions  . Cefprozil     REACTION: unknown  . Cefuroxime Axetil     REACTION: anaphylaxis  . Rabeprazole Sodium     REACTION: nausea   Current Outpatient  Prescriptions on File Prior to Visit  Medication Sig Dispense Refill  . ACCU-CHEK FASTCLIX LANCETS MISC Apply 1 application topically daily.  102 each  6  . albuterol (PROAIR HFA) 108 (90 BASE) MCG/ACT inhaler Inhale 2 puffs into the lungs 4 (four) times daily.  3 Inhaler  prn  . aspirin 81 MG tablet Take 81 mg by mouth daily.        Marland Kitchen azithromycin (ZITHROMAX) 250 MG tablet 2 today then one daily  6 each  0  . carbamazepine (TEGRETOL) 200 MG tablet Take 2 tablets (400 mg total) by mouth at bedtime.  180 tablet  3  . cholecalciferol (VITAMIN D) 1000 UNITS tablet Take 1,000 Units by mouth daily.        . citalopram (CELEXA) 10 MG tablet Take 1 tablet (10 mg total) by mouth daily.  90 tablet  3  . desonide (DESOWEN) 0.05 % lotion Use as directed      . fluticasone (FLONASE) 50 MCG/ACT nasal spray Place 1 spray into the nose daily.  16 g  prn  . glucosamine-chondroitin 500-400 MG tablet Take 1 tablet by mouth daily.        Marland Kitchen glucose blood (ACCU-CHEK COMFORT CURVE) test strip Use as instructed 1  Per day  250.02  100 each  11  . hydrochlorothiazide (HYDRODIURIL) 25 MG tablet Take 1 tablet (25 mg total) by mouth daily.  90 tablet  3  . levothyroxine (SYNTHROID, LEVOTHROID) 100 MCG  tablet Take 1 tablet (100 mcg total) by mouth daily.  90 tablet  3  . meloxicam (MOBIC) 15 MG tablet Take 1 tablet (15 mg total) by mouth daily.  90 tablet  3  . Multiple Vitamin (MULTIVITAMIN) tablet Take 1 tablet by mouth daily.        Marland Kitchen olmesartan (BENICAR) 20 MG tablet Take 1 tablet (20 mg total) by mouth daily.  90 tablet  3  . pantoprazole (PROTONIX) 40 MG tablet Take 1 tablet (40 mg total) by mouth daily.  90 tablet  3  . PROCTOSOL HC 2.5 % rectal cream APPLY RECTALLY TWICE DAILY  28.35 g  1  . atorvastatin (LIPITOR) 10 MG tablet Take 1 tablet (10 mg total) by mouth daily.  90 tablet  3   Review of Systems  Constitutional: Negative for diaphoresis and unexpected weight change.  HENT: Negative for tinnitus.   Eyes:  Negative for photophobia and visual disturbance.  Respiratory: Negative for choking and stridor.   Gastrointestinal: Negative for vomiting and blood in stool.  Genitourinary: Negative for hematuria and decreased urine volume.  Musculoskeletal: Negative for gait problem.  Skin: Negative for color change and wound.  Neurological: Negative for tremors and numbness.  Psychiatric/Behavioral: Negative for decreased concentration. The patient is not hyperactive.       Objective:   Physical Exam BP 140/60  Pulse 64  Temp 98.8 F (37.1 C) (Oral)  Ht 5\' 11"  (1.803 m)  Wt 256 lb 6 oz (116.291 kg)  BMI 35.76 kg/m2  SpO2 96% Physical Exam  VS noted Constitutional: Pt appears well-developed and well-nourished.  HENT: Head: Normocephalic.  Right Ear: External ear normal.  Left Ear: External ear normal.  Eyes: Conjunctivae and EOM are normal. Pupils are equal, round, and reactive to light.  Neck: Normal range of motion. Neck supple.  Cardiovascular: Normal rate and regular rhythm.   Pulmonary/Chest: Effort normal and breath sounds normal.  Abd:  Soft, NT, non-distended, + BS Neurological: Pt is alert. Not confused  Skin: Skin is warm. No erythema.  Psychiatric: Pt behavior is normal. Thought content normal.     Assessment & Plan:

## 2012-06-04 NOTE — Assessment & Plan Note (Signed)
stable overall by hx and exam, most recent data reviewed with pt, and pt to continue medical treatment as before Lab Results  Component Value Date   HGBA1C 6.7* 06/04/2012

## 2012-06-04 NOTE — Assessment & Plan Note (Addendum)
OK for surgury as planned, feb 2013 ecg reviewed  Note:  Total time for pt hx, exam, review of record with pt in the room, determination of diagnoses and plan for further eval and tx is > 40 min, with over 50% spent in coordination and counseling of patient

## 2012-06-04 NOTE — Assessment & Plan Note (Signed)
stable overall by hx and exam, most recent data reviewed with pt, and pt to continue medical treatment as before BP Readings from Last 3 Encounters:  06/04/12 140/60  05/22/12 120/72  12/20/11 120/62

## 2012-06-04 NOTE — Assessment & Plan Note (Signed)
stable overall by hx and exam, most recent data reviewed with pt, and pt to continue medical treatment as before Lab Results  Component Value Date   TSH 2.88 06/04/2012

## 2012-06-04 NOTE — Patient Instructions (Addendum)
Take all new medications as prescribed - the lipitor 10 mg per day Continue all other medications as before Please have the pharmacy call with any other refills you may need. Please continue your efforts at being more active, low cholesterol diet, and weight control, as you can Please go to LAB in the Basement for the blood and/or urine tests to be done today You will be contacted by phone if any changes need to be made immediately.  Otherwise, you will receive a letter about your results with an explanation, but please check with MyChart first. Thank you for enrolling in [facility]. Please follow the instructions below to securely access your online medical record. MyChart allows you to send messages to your doctor, view your test results, renew your prescriptions, schedule appointments, and more. To Log into MyChart, please go to [email or web address], and your Username is:  [place] (password [email or web address])  You are cleared for the Left Hip surgury for Jun 16, 2012

## 2012-06-05 ENCOUNTER — Encounter (HOSPITAL_COMMUNITY): Payer: Self-pay | Admitting: Pharmacy Technician

## 2012-06-06 ENCOUNTER — Other Ambulatory Visit: Payer: Self-pay | Admitting: Internal Medicine

## 2012-06-08 ENCOUNTER — Other Ambulatory Visit (HOSPITAL_COMMUNITY): Payer: Self-pay | Admitting: Orthopedic Surgery

## 2012-06-08 ENCOUNTER — Encounter (HOSPITAL_COMMUNITY)
Admission: RE | Admit: 2012-06-08 | Discharge: 2012-06-08 | Disposition: A | Discharge status: Home / Self Care | Payer: Medicare Other | Source: Ambulatory Visit | Attending: Orthopedic Surgery | Admitting: Orthopedic Surgery

## 2012-06-08 ENCOUNTER — Encounter (HOSPITAL_COMMUNITY): Payer: Self-pay

## 2012-06-08 HISTORY — DX: Hemorrhage, not elsewhere classified: R58

## 2012-06-08 HISTORY — DX: Unspecified hemorrhoids: K64.9

## 2012-06-08 LAB — URINALYSIS, ROUTINE W REFLEX MICROSCOPIC
Bilirubin Urine: NEGATIVE
Nitrite: NEGATIVE
Protein, ur: NEGATIVE mg/dL
Specific Gravity, Urine: 1.023 (ref 1.005–1.030)
Urobilinogen, UA: 0.2 mg/dL (ref 0.0–1.0)

## 2012-06-08 LAB — BASIC METABOLIC PANEL
GFR calc Af Amer: 57 mL/min — ABNORMAL LOW (ref >90)
GFR calc non Af Amer: 49 mL/min — ABNORMAL LOW (ref >90)
Potassium: 4.7 mEq/L (ref 3.5–5.1)
Sodium: 137 mEq/L (ref 135–145)

## 2012-06-08 LAB — PROTIME-INR
INR: 0.99 (ref 0.00–1.49)
Prothrombin Time: 13 seconds (ref 11.6–15.2)

## 2012-06-08 LAB — CBC
Hemoglobin: 14.4 g/dL (ref 13.0–17.0)
Platelets: 245 10*3/uL (ref 150–400)
RBC: 4.67 MIL/uL (ref 4.22–5.81)

## 2012-06-08 LAB — SURGICAL PCR SCREEN: Staphylococcus aureus: NEGATIVE

## 2012-06-08 LAB — APTT: aPTT: 28 seconds (ref 24–37)

## 2012-06-08 NOTE — Patient Instructions (Addendum)
20 CHIDUBEM PAE  06/08/2012   Your procedure is scheduled on: 06-16-2012  Report to [facility] at 0930 AM.  Call this number if you have problems the morning of surgery [phone]   Remember:bring cpap mask and tubing   Do not eat food or drink liquids :After Midnight.     Take these medicines the morning of surgery with A SIP OF WATER: albuterol inhaler if needed and bring inhaler, flonase nasal spray if needed.   Do not wear jewelry, make-up or nail polish.  Do not wear lotions, powders, or perfumes. You may wear deodorant.  Do not shave 48 hours prior to surgery. Men may shave face and neck.  Do not bring valuables to the hospital.  Contacts, dentures or bridgework may not be worn into surgery.  Leave suitcase in the car. After surgery it may be brought to your room.  For patients admitted to the hospital, checkout time is 11:00 AM the day of discharge.   Patients discharged the day of surgery will not be allowed to drive home.  Name and phone number of your driver:  Special Instructions: N/A   Please read over the following fact sheets that you were given: MRSA Information, blood fact sheet, incentive spirometer fact sheet  Call Zelphia Cairo RN pre op nurse if needed [place]. PATIENT SIGNATURE___________________________________________

## 2012-06-08 NOTE — Progress Notes (Signed)
bmet results faxed to dr Alvan Dame per epic, repeat bmet ordered day of surgery per anesthesia.

## 2012-06-08 NOTE — Progress Notes (Signed)
ekg 07-05-2011 epic Chest 2 view 05-22-2012 epic Lov/clearance note dr young 05-22-2012 on chart

## 2012-06-09 NOTE — Progress Notes (Signed)
Fax received and placed on chart, dr Wynelle Link aware bmet results no action needed. Cancelled repeat bmet for day of surgery. Medical clearance note dr Jenny Reichmann on chart

## 2012-06-11 NOTE — H&P (Signed)
TOTAL HIP ADMISSION H&P  Patient is admitted for left total hip arthroplasty, anterior approach.  Subjective:  Chief Complaint:   Left hip OA / pain  HPI: Joseph Villa, 72 y.o. male, has a history of pain and functional disability in the left hip(s) due to arthritis and patient has failed non-surgical conservative treatments for greater than 12 weeks to include NSAID's and/or analgesics and activity modification.  Onset of symptoms was gradual starting 8 years ago with gradually worsening course since that time.The patient noted no past surgery on the left hip(s).  Patient currently rates pain in the left hip at 8 out of 10 with activity. Patient has night pain, worsening of pain with activity and weight bearing, trendelenberg gait, pain that interfers with activities of daily living and pain with passive range of motion. Patient has evidence of periarticular osteophytes and joint space narrowing by imaging studies. This condition presents safety issues increasing the risk of falls.  There is no current active infection.  Risks, benefits and expectations were discussed with the patient. Patient understand the risks, benefits and expectations and wishes to proceed with surgery.  D/C Plans:  Home with HHPT  Post-op Meds:   Rx given for  ASA, Robaxin, Iron, Colace and MiraLax  Tranexamic Acid:  To be given  Decadron:   Not to be given - DM  FYI:  Will need a CPAP order  Patient Active Problem List   Diagnosis Date Noted  . Preop exam for internal medicine 06/04/2012  . Peripheral neuropathy 12/21/2011  . Anxiety 12/20/2011  . Left hip pain 04/18/2011  . Degenerative arthritis of hip 04/18/2011  . Arthritis, lumbar spine 04/18/2011  . Hemorrhoids, external 04/18/2011  . Preventative health care 12/23/2010  . HYPOTHYROIDISM 10/06/2009  . HYPERLIPIDEMIA 10/06/2009  . BIPOLAR DISORDER UNSPECIFIED 10/06/2009  . HYPERTENSION 10/06/2009  . GERD 10/06/2009  . COLONIC POLYPS, HX OF  10/06/2009  . NEPHROLITHIASIS, HX OF 10/06/2009  . DM w/o Complication Type II A999333  . OBSTRUCTIVE SLEEP APNEA 11/25/2007  . ALLERGIC RHINITIS 11/25/2007  . Allergic-infective asthma 11/25/2007   Past Medical History  Diagnosis Date  . ALLERGIC ASTHMA 11/25/2007  . ALLERGIC RHINITIS 11/25/2007  . BIPOLAR DISORDER UNSPECIFIED 10/06/2009  . COLONIC POLYPS, HX OF 10/06/2009  . DM w/o Complication Type II 123456  . GERD 10/06/2009  . HYPERLIPIDEMIA 10/06/2009  . HYPERTENSION, BENIGN 07/27/2009  . HYPOTHYROIDISM 10/06/2009  . NEPHROLITHIASIS, HX OF 10/06/2009  . OBSTRUCTIVE SLEEP APNEA 11/25/2007    cpap setting of 9  . DJD (degenerative joint disease)   . Degenerative arthritis of hip 04/18/2011  . Arthritis, lumbar spine 04/18/2011  . Hemorrhoid   . Hemorrhage yrs ago    after goiter surgery, tracheostomy inserted and later removed    Past Surgical History  Procedure Date  . Ganglion cyst excision   . Goiter   . Tonsillectomy   . Hernia repair several yrs ago    No prescriptions prior to admission   Allergies  Allergen Reactions  . Cefprozil     REACTION: unknown  . Cefuroxime Axetil     REACTION: anaphylaxis  . Rabeprazole Sodium     REACTION: nausea    History  Substance Use Topics  . Smoking status: Former Smoker -- 1.5 packs/day for 15 years    Types: Cigarettes, Pipe    Quit date: 06/23/1977  . Smokeless tobacco: Never Used  . Alcohol Use: No    Family History  Problem Relation Age of Onset  .  Cancer Mother   . Alcohol abuse Other      Review of Systems  Constitutional: Negative.   HENT: Negative.   Eyes: Negative.   Respiratory: Positive for shortness of breath (on exertion).   Cardiovascular: Negative.   Gastrointestinal: Negative.   Genitourinary: Negative.   Musculoskeletal: Positive for joint pain.  Skin: Positive for rash (eczema).  Neurological: Negative.   Endo/Heme/Allergies: Negative.   Psychiatric/Behavioral: Negative.      Objective:  Physical Exam  Constitutional: He is oriented to person, place, and time. He appears well-developed and well-nourished.  HENT:  Head: Normocephalic and atraumatic.  Mouth/Throat: Oropharynx is clear and moist.  Eyes: Pupils are equal, round, and reactive to light.  Neck: Neck supple. No JVD present. No tracheal deviation present. No thyromegaly present.  Cardiovascular: Normal rate, regular rhythm, normal heart sounds and intact distal pulses.   Respiratory: Effort normal and breath sounds normal. No stridor. No respiratory distress. He has no wheezes.  GI: Soft. There is no tenderness. There is no guarding.  Lymphadenopathy:    He has no cervical adenopathy.  Neurological: He is alert and oriented to person, place, and time.  Skin: Skin is warm and dry.  Psychiatric: He has a normal mood and affect.    Labs:  Estimated Body mass index is 35.72 kg/(m^2) as calculated from the following:   Height as of 12/20/11: 5\' 11" (1.803 m).   Weight as of 12/20/11: 256 lb 2 oz(116.178 kg).   Imaging Review Plain radiographs demonstrate severe degenerative joint disease of the left hip(s). The bone quality appears to be good for age and reported activity level.  Assessment/Plan:  End stage arthritis, left hip(s)  The patient history, physical examination, clinical judgement of the provider and imaging studies are consistent with end stage degenerative joint disease of the left hip(s) and total hip arthroplasty is deemed medically necessary. The treatment options including medical management, injection therapy, arthroscopy and arthroplasty were discussed at length. The risks and benefits of total hip arthroplasty were presented and reviewed. The risks due to aseptic loosening, infection, stiffness, dislocation/subluxation,  thromboembolic complications and other imponderables were discussed.  The patient acknowledged the explanation, agreed to proceed with the plan and consent was  signed. Patient is being admitted for inpatient treatment for surgery, pain control, PT, OT, prophylactic antibiotics, VTE prophylaxis, progressive ambulation and ADL's and discharge planning.The patient is planning to be discharged home with home health services.    Joseph Villa   PAC  06/11/2012, 1:08 PM

## 2012-06-15 NOTE — Progress Notes (Signed)
Pt aware surgery time changed to 1030, arrive 0800 am [facility] short stay

## 2012-06-16 ENCOUNTER — Encounter (HOSPITAL_COMMUNITY)
Admission: RE | Disposition: A | Discharge status: Home Health | Payer: Self-pay | Source: Ambulatory Visit | Attending: Orthopedic Surgery

## 2012-06-16 ENCOUNTER — Inpatient Hospital Stay (HOSPITAL_COMMUNITY): Payer: Medicare Other

## 2012-06-16 ENCOUNTER — Encounter (HOSPITAL_COMMUNITY): Payer: Self-pay | Admitting: Anesthesiology

## 2012-06-16 ENCOUNTER — Inpatient Hospital Stay (HOSPITAL_COMMUNITY): Payer: Medicare Other | Admitting: Anesthesiology

## 2012-06-16 ENCOUNTER — Encounter (HOSPITAL_COMMUNITY): Payer: Self-pay

## 2012-06-16 ENCOUNTER — Inpatient Hospital Stay (HOSPITAL_COMMUNITY)
Admission: RE | Admit: 2012-06-16 | Discharge: 2012-06-17 | DRG: 470 | Disposition: A | Discharge status: Home Health | Payer: Medicare Other | Source: Ambulatory Visit | Attending: Orthopedic Surgery | Admitting: Orthopedic Surgery

## 2012-06-16 DIAGNOSIS — G4733 Obstructive sleep apnea (adult) (pediatric): Secondary | ICD-10-CM | POA: Diagnosis present

## 2012-06-16 DIAGNOSIS — M1610 Unilateral primary osteoarthritis, unspecified hip: Principal | ICD-10-CM | POA: Diagnosis present

## 2012-06-16 DIAGNOSIS — E039 Hypothyroidism, unspecified: Secondary | ICD-10-CM | POA: Diagnosis present

## 2012-06-16 DIAGNOSIS — F319 Bipolar disorder, unspecified: Secondary | ICD-10-CM | POA: Diagnosis present

## 2012-06-16 DIAGNOSIS — E785 Hyperlipidemia, unspecified: Secondary | ICD-10-CM | POA: Diagnosis present

## 2012-06-16 DIAGNOSIS — Z87442 Personal history of urinary calculi: Secondary | ICD-10-CM

## 2012-06-16 DIAGNOSIS — D62 Acute posthemorrhagic anemia: Secondary | ICD-10-CM | POA: Diagnosis not present

## 2012-06-16 DIAGNOSIS — K219 Gastro-esophageal reflux disease without esophagitis: Secondary | ICD-10-CM | POA: Diagnosis present

## 2012-06-16 DIAGNOSIS — M169 Osteoarthritis of hip, unspecified: Principal | ICD-10-CM | POA: Diagnosis present

## 2012-06-16 DIAGNOSIS — Z860100 Personal history of colon polyps, unspecified: Secondary | ICD-10-CM

## 2012-06-16 DIAGNOSIS — G609 Hereditary and idiopathic neuropathy, unspecified: Secondary | ICD-10-CM | POA: Diagnosis present

## 2012-06-16 DIAGNOSIS — I10 Essential (primary) hypertension: Secondary | ICD-10-CM | POA: Diagnosis present

## 2012-06-16 DIAGNOSIS — Z6835 Body mass index (BMI) 35.0-35.9, adult: Secondary | ICD-10-CM

## 2012-06-16 DIAGNOSIS — Z87891 Personal history of nicotine dependence: Secondary | ICD-10-CM

## 2012-06-16 DIAGNOSIS — J45909 Unspecified asthma, uncomplicated: Secondary | ICD-10-CM | POA: Diagnosis present

## 2012-06-16 DIAGNOSIS — E119 Type 2 diabetes mellitus without complications: Secondary | ICD-10-CM | POA: Diagnosis present

## 2012-06-16 DIAGNOSIS — D500 Iron deficiency anemia secondary to blood loss (chronic): Secondary | ICD-10-CM

## 2012-06-16 DIAGNOSIS — F411 Generalized anxiety disorder: Secondary | ICD-10-CM | POA: Diagnosis present

## 2012-06-16 DIAGNOSIS — Z96649 Presence of unspecified artificial hip joint: Secondary | ICD-10-CM

## 2012-06-16 DIAGNOSIS — Z79899 Other long term (current) drug therapy: Secondary | ICD-10-CM

## 2012-06-16 DIAGNOSIS — E669 Obesity, unspecified: Secondary | ICD-10-CM

## 2012-06-16 HISTORY — PX: TOTAL HIP ARTHROPLASTY: SHX124

## 2012-06-16 LAB — GLUCOSE, CAPILLARY
Glucose-Capillary: 140 mg/dL — ABNORMAL HIGH (ref 70–99)
Glucose-Capillary: 119 mg/dL — ABNORMAL HIGH (ref 70–99)
Glucose-Capillary: 137 mg/dL — ABNORMAL HIGH (ref 70–99)

## 2012-06-16 LAB — BASIC METABOLIC PANEL
CO2: 25 mEq/L (ref 19–32)
Chloride: 104 mEq/L (ref 96–112)
Creatinine, Ser: 1.21 mg/dL (ref 0.50–1.35)

## 2012-06-16 SURGERY — ARTHROPLASTY, HIP, TOTAL, ANTERIOR APPROACH
Anesthesia: Spinal | Site: Hip | Laterality: Left | Wound class: Clean

## 2012-06-16 MED ORDER — ONDANSETRON HCL 4 MG/2ML IJ SOLN: 4.0000 mg | Freq: Four times a day (QID) | INTRAMUSCULAR | Status: DC | PRN

## 2012-06-16 MED ORDER — EPHEDRINE SULFATE 50 MG/ML IJ SOLN
INTRAMUSCULAR | Status: DC | PRN
  Administered 2012-06-16: 5 mg via INTRAVENOUS

## 2012-06-16 MED ORDER — CLINDAMYCIN PHOSPHATE 600 MG/50ML IV SOLN
600.0000 mg | Freq: Four times a day (QID) | INTRAVENOUS | Status: AC
  Administered 2012-06-16 (×2): 600 mg via INTRAVENOUS
  Filled 2012-06-16 (×2): qty 50

## 2012-06-16 MED ORDER — FLEET ENEMA 7-19 GM/118ML RE ENEM: 1.0000 | ENEMA | Freq: Once | RECTAL | Status: AC | PRN

## 2012-06-16 MED ORDER — CARBAMAZEPINE 200 MG PO TABS
400.0000 mg | ORAL_TABLET | Freq: Every day | ORAL | Status: DC
  Administered 2012-06-16: 400 mg via ORAL
  Filled 2012-06-16 (×2): qty 2

## 2012-06-16 MED ORDER — HYDROCHLOROTHIAZIDE 25 MG PO TABS
25.0000 mg | ORAL_TABLET | Freq: Every morning | ORAL | Status: DC
  Administered 2012-06-16 – 2012-06-17 (×2): 25 mg via ORAL
  Filled 2012-06-16 (×2): qty 1

## 2012-06-16 MED ORDER — CELECOXIB 200 MG PO CAPS
200.0000 mg | ORAL_CAPSULE | Freq: Two times a day (BID) | ORAL | Status: DC
  Administered 2012-06-16 – 2012-06-17 (×3): 200 mg via ORAL
  Filled 2012-06-16 (×4): qty 1

## 2012-06-16 MED ORDER — FLUTICASONE PROPIONATE 50 MCG/ACT NA SUSP
2.0000 | Freq: Every day | NASAL | Status: DC | PRN
  Filled 2012-06-16: qty 16

## 2012-06-16 MED ORDER — BUPIVACAINE HCL (PF) 0.5 % IJ SOLN
INTRAMUSCULAR | Status: DC | PRN
  Administered 2012-06-16: 3 mL

## 2012-06-16 MED ORDER — HYDROCODONE-ACETAMINOPHEN 7.5-325 MG PO TABS
1.0000 | ORAL_TABLET | ORAL | Status: DC
  Administered 2012-06-16 (×2): 2 via ORAL
  Administered 2012-06-17: 1 via ORAL
  Administered 2012-06-17: 2 via ORAL
  Administered 2012-06-17: 1 via ORAL
  Filled 2012-06-16 (×5): qty 2

## 2012-06-16 MED ORDER — PANTOPRAZOLE SODIUM 40 MG PO TBEC
40.0000 mg | DELAYED_RELEASE_TABLET | Freq: Every day | ORAL | Status: DC
  Administered 2012-06-16: 40 mg via ORAL
  Filled 2012-06-16 (×2): qty 1

## 2012-06-16 MED ORDER — MEPERIDINE HCL 50 MG/ML IJ SOLN: 6.2500 mg | INTRAMUSCULAR | Status: DC | PRN

## 2012-06-16 MED ORDER — ACETAMINOPHEN 10 MG/ML IV SOLN
INTRAVENOUS | Status: DC | PRN
  Administered 2012-06-16: 1000 mg via INTRAVENOUS

## 2012-06-16 MED ORDER — 0.9 % SODIUM CHLORIDE (POUR BTL) OPTIME
TOPICAL | Status: DC | PRN
  Administered 2012-06-16: 1000 mL

## 2012-06-16 MED ORDER — HYDROMORPHONE HCL PF 1 MG/ML IJ SOLN
INTRAMUSCULAR | Status: AC
  Filled 2012-06-16: qty 1

## 2012-06-16 MED ORDER — HYDROMORPHONE HCL PF 1 MG/ML IJ SOLN: 0.5000 mg | INTRAMUSCULAR | Status: DC | PRN

## 2012-06-16 MED ORDER — DOCUSATE SODIUM 100 MG PO CAPS
100.0000 mg | ORAL_CAPSULE | Freq: Two times a day (BID) | ORAL | Status: DC
  Administered 2012-06-16 – 2012-06-17 (×2): 100 mg via ORAL
  Filled 2012-06-16: qty 1

## 2012-06-16 MED ORDER — FERROUS SULFATE 325 (65 FE) MG PO TABS
325.0000 mg | ORAL_TABLET | Freq: Three times a day (TID) | ORAL | Status: DC
  Administered 2012-06-16 – 2012-06-17 (×3): 325 mg via ORAL
  Filled 2012-06-16 (×5): qty 1

## 2012-06-16 MED ORDER — CLINDAMYCIN PHOSPHATE 900 MG/50ML IV SOLN
900.0000 mg | INTRAVENOUS | Status: AC
  Administered 2012-06-16: 900 mg via INTRAVENOUS
  Filled 2012-06-16: qty 50

## 2012-06-16 MED ORDER — METHOCARBAMOL 500 MG PO TABS: 500.0000 mg | ORAL_TABLET | Freq: Four times a day (QID) | ORAL | Status: DC | PRN

## 2012-06-16 MED ORDER — KETAMINE HCL 10 MG/ML IJ SOLN
INTRAMUSCULAR | Status: DC | PRN
  Administered 2012-06-16 (×5): 10 mg via INTRAVENOUS

## 2012-06-16 MED ORDER — ONDANSETRON HCL 4 MG/2ML IJ SOLN
INTRAMUSCULAR | Status: DC | PRN
  Administered 2012-06-16: 4 mg via INTRAVENOUS

## 2012-06-16 MED ORDER — ALUM & MAG HYDROXIDE-SIMETH 200-200-20 MG/5ML PO SUSP: 30.0000 mL | ORAL | Status: DC | PRN

## 2012-06-16 MED ORDER — BUPIVACAINE HCL (PF) 0.5 % IJ SOLN
INTRAMUSCULAR | Status: AC
  Filled 2012-06-16: qty 30

## 2012-06-16 MED ORDER — ENOXAPARIN SODIUM 40 MG/0.4ML ~~LOC~~ SOLN
40.0000 mg | SUBCUTANEOUS | Status: DC
  Administered 2012-06-17: 40 mg via SUBCUTANEOUS
  Filled 2012-06-16 (×2): qty 0.4

## 2012-06-16 MED ORDER — LACTATED RINGERS IV SOLN: INTRAVENOUS | Status: DC

## 2012-06-16 MED ORDER — CITALOPRAM HYDROBROMIDE 10 MG PO TABS
10.0000 mg | ORAL_TABLET | Freq: Every day | ORAL | Status: DC
  Administered 2012-06-16 (×2): 10 mg via ORAL
  Filled 2012-06-16 (×3): qty 1

## 2012-06-16 MED ORDER — LEVOTHYROXINE SODIUM 100 MCG PO TABS
100.0000 ug | ORAL_TABLET | Freq: Every day | ORAL | Status: DC
  Administered 2012-06-16: 100 ug via ORAL
  Filled 2012-06-16 (×2): qty 1

## 2012-06-16 MED ORDER — PROPOFOL 10 MG/ML IV EMUL
INTRAVENOUS | Status: DC | PRN
  Administered 2012-06-16: 75 ug/kg/min via INTRAVENOUS

## 2012-06-16 MED ORDER — MENTHOL 3 MG MT LOZG: 1.0000 | LOZENGE | OROMUCOSAL | Status: DC | PRN

## 2012-06-16 MED ORDER — METHOCARBAMOL 100 MG/ML IJ SOLN
500.0000 mg | Freq: Four times a day (QID) | INTRAVENOUS | Status: DC | PRN
  Administered 2012-06-16: 500 mg via INTRAVENOUS
  Filled 2012-06-16: qty 5

## 2012-06-16 MED ORDER — ONDANSETRON HCL 4 MG PO TABS: 4.0000 mg | ORAL_TABLET | Freq: Four times a day (QID) | ORAL | Status: DC | PRN

## 2012-06-16 MED ORDER — CLOBETASOL PROPIONATE 0.05 % EX CREA
1.0000 "application " | TOPICAL_CREAM | Freq: Two times a day (BID) | CUTANEOUS | Status: DC | PRN
  Filled 2012-06-16: qty 15

## 2012-06-16 MED ORDER — HYDROMORPHONE HCL PF 1 MG/ML IJ SOLN
0.2500 mg | INTRAMUSCULAR | Status: DC | PRN
  Administered 2012-06-16: 0.5 mg via INTRAVENOUS

## 2012-06-16 MED ORDER — IRBESARTAN 150 MG PO TABS
150.0000 mg | ORAL_TABLET | Freq: Every day | ORAL | Status: DC
  Administered 2012-06-16 – 2012-06-17 (×2): 150 mg via ORAL
  Filled 2012-06-16 (×2): qty 1

## 2012-06-16 MED ORDER — HYDROCORTISONE 2.5 % RE CREA
1.0000 "application " | TOPICAL_CREAM | Freq: Two times a day (BID) | RECTAL | Status: DC | PRN
  Filled 2012-06-16: qty 28.35

## 2012-06-16 MED ORDER — SODIUM CHLORIDE 0.9 % IV SOLN
100.0000 mL/h | INTRAVENOUS | Status: DC
  Administered 2012-06-16 – 2012-06-17 (×2): 100 mL/h via INTRAVENOUS
  Filled 2012-06-16 (×8): qty 1000

## 2012-06-16 MED ORDER — MIDAZOLAM HCL 5 MG/5ML IJ SOLN
INTRAMUSCULAR | Status: DC | PRN
  Administered 2012-06-16: 2 mg via INTRAVENOUS

## 2012-06-16 MED ORDER — PHENOL 1.4 % MT LIQD: 1.0000 | OROMUCOSAL | Status: DC | PRN

## 2012-06-16 MED ORDER — ATORVASTATIN CALCIUM 10 MG PO TABS
10.0000 mg | ORAL_TABLET | Freq: Every evening | ORAL | Status: DC
  Administered 2012-06-16: 10 mg via ORAL
  Filled 2012-06-16 (×2): qty 1

## 2012-06-16 MED ORDER — RIVAROXABAN 10 MG PO TABS: 10.0000 mg | ORAL_TABLET | ORAL | Status: DC

## 2012-06-16 MED ORDER — INSULIN ASPART 100 UNIT/ML ~~LOC~~ SOLN: 0.0000 [IU] | Freq: Three times a day (TID) | SUBCUTANEOUS | Status: DC

## 2012-06-16 MED ORDER — BISACODYL 10 MG RE SUPP: 10.0000 mg | Freq: Every day | RECTAL | Status: DC | PRN

## 2012-06-16 MED ORDER — PROMETHAZINE HCL 25 MG/ML IJ SOLN
6.2500 mg | INTRAMUSCULAR | Status: DC | PRN
Start: 2012-06-16 — End: 2012-06-16

## 2012-06-16 MED ORDER — POLYETHYLENE GLYCOL 3350 17 G PO PACK
17.0000 g | PACK | Freq: Two times a day (BID) | ORAL | Status: DC
  Administered 2012-06-16 – 2012-06-17 (×2): 17 g via ORAL
  Filled 2012-06-16: qty 1

## 2012-06-16 MED ORDER — ALBUTEROL SULFATE HFA 108 (90 BASE) MCG/ACT IN AERS
2.0000 | INHALATION_SPRAY | Freq: Four times a day (QID) | RESPIRATORY_TRACT | Status: DC | PRN
  Filled 2012-06-16: qty 6.7

## 2012-06-16 MED ORDER — DIPHENHYDRAMINE HCL 25 MG PO CAPS: 25.0000 mg | ORAL_CAPSULE | Freq: Four times a day (QID) | ORAL | Status: DC | PRN

## 2012-06-16 MED ORDER — TRANEXAMIC ACID 100 MG/ML IV SOLN
1760.0000 mg | Freq: Once | INTRAVENOUS | Status: AC
  Administered 2012-06-16: 1755 mg via INTRAVENOUS
  Filled 2012-06-16: qty 17.6

## 2012-06-16 MED ORDER — ZOLPIDEM TARTRATE 5 MG PO TABS: 5.0000 mg | ORAL_TABLET | Freq: Every evening | ORAL | Status: DC | PRN

## 2012-06-16 MED ORDER — METOCLOPRAMIDE HCL 5 MG/ML IJ SOLN: 5.0000 mg | Freq: Three times a day (TID) | INTRAMUSCULAR | Status: DC | PRN

## 2012-06-16 MED ORDER — METOCLOPRAMIDE HCL 10 MG PO TABS: 5.0000 mg | ORAL_TABLET | Freq: Three times a day (TID) | ORAL | Status: DC | PRN

## 2012-06-16 MED ORDER — LACTATED RINGERS IV SOLN
INTRAVENOUS | Status: DC | PRN
  Administered 2012-06-16 (×3): via INTRAVENOUS

## 2012-06-16 SURGICAL SUPPLY — 39 items
BAG ZIPLOCK 12X15 (MISCELLANEOUS) ×4 IMPLANT
BLADE SAW SGTL 18X1.27X75 (BLADE) ×2 IMPLANT
CLOTH BEACON ORANGE TIMEOUT ST (SAFETY) ×2 IMPLANT
DERMABOND ADVANCED (GAUZE/BANDAGES/DRESSINGS) ×1
DERMABOND ADVANCED .7 DNX12 (GAUZE/BANDAGES/DRESSINGS) ×1 IMPLANT
DRAPE C-ARM 42X72 X-RAY (DRAPES) ×2 IMPLANT
DRAPE STERI IOBAN 125X83 (DRAPES) ×2 IMPLANT
DRAPE U-SHAPE 47X51 STRL (DRAPES) ×6 IMPLANT
DRSG AQUACEL AG ADV 3.5X10 (GAUZE/BANDAGES/DRESSINGS) ×2 IMPLANT
DRSG TEGADERM 4X4.75 (GAUZE/BANDAGES/DRESSINGS) ×2 IMPLANT
DURAPREP 26ML APPLICATOR (WOUND CARE) ×2 IMPLANT
ELECT BLADE TIP CTD 4 INCH (ELECTRODE) ×2 IMPLANT
ELECT REM PT RETURN 9FT ADLT (ELECTROSURGICAL) ×2
ELECTRODE REM PT RTRN 9FT ADLT (ELECTROSURGICAL) ×1 IMPLANT
EVACUATOR 1/8 PVC DRAIN (DRAIN) IMPLANT
FACESHIELD LNG OPTICON STERILE (SAFETY) ×8 IMPLANT
GAUZE SPONGE 2X2 8PLY STRL LF (GAUZE/BANDAGES/DRESSINGS) ×1 IMPLANT
GLOVE BIOGEL PI IND STRL 7.5 (GLOVE) ×1 IMPLANT
GLOVE BIOGEL PI IND STRL 8 (GLOVE) ×1 IMPLANT
GLOVE BIOGEL PI INDICATOR 7.5 (GLOVE) ×1
GLOVE BIOGEL PI INDICATOR 8 (GLOVE) ×1
GLOVE ECLIPSE 8.0 STRL XLNG CF (GLOVE) ×2 IMPLANT
GLOVE ORTHO TXT STRL SZ7.5 (GLOVE) ×4 IMPLANT
GOWN BRE IMP PREV XXLGXLNG (GOWN DISPOSABLE) ×4 IMPLANT
GOWN STRL NON-REIN LRG LVL3 (GOWN DISPOSABLE) ×2 IMPLANT
KIT BASIN OR (CUSTOM PROCEDURE TRAY) ×2 IMPLANT
PACK TOTAL JOINT (CUSTOM PROCEDURE TRAY) ×2 IMPLANT
PADDING CAST COTTON 6X4 STRL (CAST SUPPLIES) ×2 IMPLANT
SPONGE GAUZE 2X2 STER 10/PKG (GAUZE/BANDAGES/DRESSINGS) ×1
SPONGE GAUZE 4X4 12PLY (GAUZE/BANDAGES/DRESSINGS) ×2 IMPLANT
SUCTION FRAZIER 12FR DISP (SUCTIONS) ×2 IMPLANT
SUT MNCRL AB 4-0 PS2 18 (SUTURE) ×2 IMPLANT
SUT VIC AB 1 CT1 36 (SUTURE) ×8 IMPLANT
SUT VIC AB 2-0 CT1 27 (SUTURE) ×2
SUT VIC AB 2-0 CT1 TAPERPNT 27 (SUTURE) ×2 IMPLANT
SUT VLOC 180 0 24IN GS25 (SUTURE) ×2 IMPLANT
TOWEL OR 17X26 10 PK STRL BLUE (TOWEL DISPOSABLE) ×4 IMPLANT
TRAY FOLEY CATH 14FRSI W/METER (CATHETERS) ×2 IMPLANT
WATER STERILE IRR 1500ML POUR (IV SOLUTION) ×4 IMPLANT

## 2012-06-16 NOTE — Op Note (Signed)
NAME:  Joseph Villa NO.: [id number]      MEDICAL RECORD NO.: [id number]      FACILITY:  [facility]      PHYSICIAN:  Paralee Cancel D  DATE OF BIRTH:  1940/07/11     DATE OF PROCEDURE:  06/16/2012                                 OPERATIVE REPORT         PREOPERATIVE DIAGNOSIS: Left  hip osteoarthritis.      POSTOPERATIVE DIAGNOSIS:  Left hip osteoarthritis.      PROCEDURE:  Left total hip replacement through an anterior approach   utilizing DePuy THR system, component size 61mm pinnacle cup, a size 36+4 neutral   Altrex liner, a size 7 Hi Tri Lock stem with a 36+5 delta ceramic   ball.      SURGEON:  Pietro Cassis. Alvan Dame, M.D.      ASSISTANT:  Molli Barrows, PA-C     ANESTHESIA:  Spinal.      SPECIMENS:  None.      COMPLICATIONS:  None.      BLOOD LOSS:  350 cc     DRAINS:  One Hemovac.      INDICATION OF THE PROCEDURE:  Joseph Villa is a 72 y.o. male who had   presented to office for evaluation of left hip pain.  Radiographs revealed   progressive degenerative changes with bone-on-bone   articulation to the  hip joint.  The patient had painful limited range of   motion significantly affecting their overall quality of life.  The patient was failing to    respond to conservative measures, and at this point was ready   to proceed with more definitive measures.  The patient has noted progressive   degenerative changes in his hip, progressive problems and dysfunction   with regarding the hip prior to surgery.  Consent was obtained for   benefit of pain relief.  Specific risk of infection, DVT, component   failure, dislocation, need for revision surgery, as well discussion of   the anterior versus posterior approach were reviewed.  Consent was   obtained for benefit of anterior pain relief through an anterior   approach.      PROCEDURE IN DETAIL:  The patient was brought to operative theater.   Once adequate anesthesia,  preoperative antibiotics, 900mg  Clindamycin administered.   The patient was positioned supine on the OSI Hanna table.  Once adequate   padding of boney process was carried out, we had predraped out the hip, and  used fluoroscopy to confirm orientation of the pelvis and position.      The left hip was then prepped and draped from proximal iliac crest to   mid thigh with shower curtain technique.      Time-out was performed identifying the patient, planned procedure, and   extremity.     An incision was then made 2 cm distal and lateral to the   anterior superior iliac spine extending over the orientation of the   tensor fascia lata muscle and sharp dissection was carried down to the   fascia of the muscle and protractor placed in the soft tissues.      The fascia was then incised.  The muscle belly was identified and swept  laterally and retractor placed along the superior neck.  Following   cauterization of the circumflex vessels and removing some pericapsular   fat, a second cobra retractor was placed on the inferior neck.  A third   retractor was placed on the anterior acetabulum after elevating the   anterior rectus.  A L-capsulotomy was along the line of the   superior neck to the trochanteric fossa, then extended proximally and   distally.  Tag sutures were placed and the retractors were then placed   intracapsular.  We then identified the trochanteric fossa and   orientation of my neck cut, confirmed this radiographically   and then made a neck osteotomy with the femur on traction.  The femoral   head was removed without difficulty or complication.  Traction was let   off and retractors were placed posterior and anterior around the   acetabulum.      The labrum and foveal tissue were debrided.  I began reaming with a 52mm   reamer and reamed up to 41mm reamer with good bony bed preparation and a 54   cup was chosen.  The final 86mm Pinnacle cup was then impacted under  fluoroscopy  to confirm the depth of penetration and orientation with respect to   abduction.  A screw was placed followed by the hole eliminator.  The final   36+4 neutral Altrex liner was impacted with good visualized rim fit.  The cup was positioned anatomically within the acetabular portion of the pelvis.      At this point, the femur was rolled at 80 degrees.  Further capsule was   released off the inferior aspect of the femoral neck.  I then   released the superior capsule proximally.  The hook was placed laterally   along the femur and elevated manually and held in position with the bed   hook.  The leg was then extended and adducted with the leg rolled to 100   degrees of external rotation.  Once the proximal femur was fully   exposed, I used a box osteotome to set orientation.  I then began   broaching with the starting chili pepper broach and passed this by hand and then broached up to 7.  With the 7 broach in place I chose a high offset neck and did a trial reduction.  The offset was appropriate, leg lengths   appeared to be equal, confirmed radiographically.   Given these findings, I went ahead and dislocated the hip, repositioned all   retractors and positioned the right hip in the extended and abducted position.  The final 7 Hi Tri Lock stem was   chosen and it was impacted down to the level of neck cut.  Based on this   and the trial reduction, a 36+5 delta ceramic ball was chosen and   impacted onto a clean and dry trunion to optimize leg length, and the hip was reduced.  The   hip had been irrigated throughout the case again at this point.  I did   reapproximate the superior capsular leaflet to the anterior leaflet   using #1 Vicryl, placed a medium Hemovac drain deep.  The fascia of the   tensor fascia lata muscle was then reapproximated using #1 Vicryl.  The   remaining wound was closed with 2-0 Vicryl and running 4-0 Monocryl.   The hip was cleaned, dried, and dressed  sterilely using Dermabond and   Aquacel dressing.  Drain site dressed  separately.  She was then brought   to recovery room in stable condition tolerating the procedure well.    Molli Barrows, PA-C was present for the entirety of the case involved from   preoperative positioning, perioperative retractor management, general   facilitation of the case, as well as primary wound closure as assistant.            Pietro Cassis Alvan Dame, M.D.            MDO/MEDQ  D:  03/26/2011  T:  03/26/2011  Job:  [id number]      Electronically Signed by Paralee Cancel M.D. on 04/01/2011 09:15:38 AM

## 2012-06-16 NOTE — Progress Notes (Signed)
Utilization review completed.  

## 2012-06-16 NOTE — Interval H&P Note (Signed)
History and Physical Interval Note:  06/16/2012 9:16 AM  Joseph Villa  has presented today for surgery, with the diagnosis of LEFT HIP OA  The various methods of treatment have been discussed with the patient and family. After consideration of risks, benefits and other options for treatment, the patient has consented to  Procedure(s) (LRB) with comments: [place] (Left) as a surgical intervention .  The patient's history has been reviewed, patient examined, no change in status, stable for surgery.  I have reviewed the patient's chart and labs.  Questions were answered to the patient's satisfaction.     Mauri Pole

## 2012-06-16 NOTE — Anesthesia Postprocedure Evaluation (Signed)
  Anesthesia Post-op Note  Patient: Joseph Villa  Procedure(s) Performed: Procedure(s) (LRB): TOTAL HIP ARTHROPLASTY ANTERIOR APPROACH (Left)  Patient Location: PACU  Anesthesia Type: Spinal  Level of Consciousness: awake and alert   Airway and Oxygen Therapy: Patient Spontanous Breathing  Post-op Pain: mild  Post-op Assessment: Post-op Vital signs reviewed, Patient's Cardiovascular Status Stable, Respiratory Function Stable, Patent Airway and No signs of Nausea or vomiting  Last Vitals:  Filed Vitals:   06/16/12 1342  BP: 147/79  Pulse: 63  Temp: 36.2 C  Resp: 16    Post-op Vital Signs: stable   Complications: No apparent anesthesia complications

## 2012-06-16 NOTE — Transfer of Care (Signed)
Immediate Anesthesia Transfer of Care Note  Patient: Joseph Villa  Procedure(s) Performed: Procedure(s) (LRB) with comments: TOTAL HIP ARTHROPLASTY ANTERIOR APPROACH (Left)  Patient Location: PACU  Anesthesia Type:Spinal  Level of Consciousness: awake, alert , oriented and patient cooperative  Airway & Oxygen Therapy: Patient Spontanous Breathing and Patient connected to face mask oxygen  Post-op Assessment: Report given to PACU RN, Post -op Vital signs reviewed and stable and Patient moving all extremities  Post vital signs: Reviewed and stable  Complications: No apparent anesthesia complications

## 2012-06-16 NOTE — Preoperative (Addendum)
Beta Blockers   Reason not to administer Beta Blockers:Not Applicable 

## 2012-06-16 NOTE — Anesthesia Procedure Notes (Signed)
Spinal  Patient location during procedure: OR Staffing Anesthesiologist: Kaelin Bonelli Performed by: anesthesiologist  Preanesthetic Checklist Completed: patient identified, site marked, surgical consent, pre-op evaluation, timeout performed, IV checked, risks and benefits discussed and monitors and equipment checked Spinal Block Patient position: sitting Prep: Betadine Patient monitoring: heart rate, continuous pulse ox and blood pressure Approach: right paramedian Location: L2-3 Injection technique: single-shot Needle Needle type: Spinocan  Needle gauge: 22 G Needle length: 9 cm Additional Notes Expiration date of kit checked and confirmed. Patient tolerated procedure well, without complications.     

## 2012-06-16 NOTE — Anesthesia Preprocedure Evaluation (Addendum)
Anesthesia Evaluation    Airway Mallampati: III TM Distance: >3 FB Neck ROM: Full   Comment: Previous trach Dental No notable dental hx.    Pulmonary asthma , sleep apnea and Continuous Positive Airway Pressure Ventilation ,  breath sounds clear to auscultation  Pulmonary exam normal       Cardiovascular hypertension, Pt. on medications - Peripheral Vascular Disease Rhythm:Regular Rate:Normal     Neuro/Psych PSYCHIATRIC DISORDERS Anxiety bipolar Neuromuscular disease (peripheral neuropathy)    GI/Hepatic   Endo/Other  diabetesHypothyroidism   Renal/GU      Musculoskeletal   Abdominal   Peds  Hematology   Anesthesia Other Findings   Reproductive/Obstetrics                          Anesthesia Physical Anesthesia Plan  ASA: III  Anesthesia Plan: Spinal   Post-op Pain Management:    Induction:   Airway Management Planned: Simple Face Mask  Additional Equipment:   Intra-op Plan:   Post-operative Plan:   Informed Consent: I have reviewed the patients History and Physical, chart, labs and discussed the procedure including the risks, benefits and alternatives for the proposed anesthesia with the patient or authorized representative who has indicated his/her understanding and acceptance.   Dental advisory given  Plan Discussed with:   Anesthesia Plan Comments:         Anesthesia Quick Evaluation

## 2012-06-17 ENCOUNTER — Encounter (HOSPITAL_COMMUNITY): Payer: Self-pay | Admitting: Orthopedic Surgery

## 2012-06-17 DIAGNOSIS — D500 Iron deficiency anemia secondary to blood loss (chronic): Secondary | ICD-10-CM

## 2012-06-17 DIAGNOSIS — E669 Obesity, unspecified: Secondary | ICD-10-CM

## 2012-06-17 LAB — GLUCOSE, CAPILLARY: Glucose-Capillary: 125 mg/dL — ABNORMAL HIGH (ref 70–99)

## 2012-06-17 LAB — BASIC METABOLIC PANEL
Calcium: 8.2 mg/dL — ABNORMAL LOW (ref 8.4–10.5)
Creatinine, Ser: 1.2 mg/dL (ref 0.50–1.35)
GFR calc Af Amer: 69 mL/min — ABNORMAL LOW (ref >90)
Sodium: 137 mEq/L (ref 135–145)

## 2012-06-17 LAB — CBC
MCH: 30.6 pg (ref 26.0–34.0)
MCV: 90.4 fL (ref 78.0–100.0)
Platelets: 137 10*3/uL — ABNORMAL LOW (ref 150–400)
RBC: 3.63 MIL/uL — ABNORMAL LOW (ref 4.22–5.81)
RDW: 12.8 % (ref 11.5–15.5)
WBC: 8.8 10*3/uL (ref 4.0–10.5)

## 2012-06-17 MED ORDER — DSS 100 MG PO CAPS: 100.0000 mg | ORAL_CAPSULE | Freq: Two times a day (BID) | ORAL | Status: DC

## 2012-06-17 MED ORDER — POLYETHYLENE GLYCOL 3350 17 G PO PACK: 17.0000 g | PACK | Freq: Two times a day (BID) | ORAL | Status: DC

## 2012-06-17 MED ORDER — FERROUS SULFATE 325 (65 FE) MG PO TABS: 325.0000 mg | ORAL_TABLET | Freq: Three times a day (TID) | ORAL | Status: DC

## 2012-06-17 MED ORDER — ASPIRIN EC 325 MG PO TBEC: 325.0000 mg | DELAYED_RELEASE_TABLET | Freq: Two times a day (BID) | ORAL | Status: DC

## 2012-06-17 MED ORDER — HYDROCODONE-ACETAMINOPHEN 7.5-325 MG PO TABS: 1.0000 | ORAL_TABLET | ORAL | Status: DC | PRN

## 2012-06-17 MED ORDER — DIPHENHYDRAMINE HCL 25 MG PO CAPS: 25.0000 mg | ORAL_CAPSULE | Freq: Four times a day (QID) | ORAL | Status: DC | PRN

## 2012-06-17 MED ORDER — METHOCARBAMOL 500 MG PO TABS: 500.0000 mg | ORAL_TABLET | Freq: Four times a day (QID) | ORAL | Status: DC | PRN

## 2012-06-17 NOTE — Discharge Summary (Signed)
Physician Discharge Summary  Patient ID: SIMS DUPIN MRN: [id number] DOB/AGE: [birth year] 70 y.o.  Admit date: 06/16/2012 Discharge date: 06/17/2012   Procedures:  Procedure(s) (LRB): TOTAL HIP ARTHROPLASTY ANTERIOR APPROACH (Left)  Attending Physician:  Dr. Paralee Cancel   Admission Diagnoses:   Left hip OA / pain  Discharge Diagnoses:  Principal Problem:  *S/P left THA, AA Active Problems:  Expected blood loss anemia  Obese ALLERGIC ASTHMA   ALLERGIC RHINITIS  BIPOLAR DISORDER UNSPECIFIED   COLONIC POLYPS, HX OF   DM w/o Complication Type II   GERD   HYPERLIPIDEMIA   HYPERTENSION, BENIGN   HYPOTHYROIDISM   NEPHROLITHIASIS, HX OF   OBSTRUCTIVE SLEEP APNEA   DJD (degenerative joint disease)   Degenerative arthritis of hip   Arthritis, lumbar spine   Hemorrhoid    HPI:   Joseph Villa, 72 y.o. male, has a history of pain and functional disability in the left hip(s) due to arthritis and patient has failed non-surgical conservative treatments for greater than 12 weeks to include NSAID's and/or analgesics and activity modification. Onset of symptoms was gradual starting 8 years ago with gradually worsening course since that time.The patient noted no past surgery on the left hip(s). Patient currently rates pain in the left hip at 8 out of 10 with activity. Patient has night pain, worsening of pain with activity and weight bearing, trendelenberg gait, pain that interfers with activities of daily living and pain with passive range of motion. Patient has evidence of periarticular osteophytes and joint space narrowing by imaging studies. This condition presents safety issues increasing the risk of falls. There is no current active infection. Risks, benefits and expectations were discussed with the patient. Patient understand the risks, benefits and expectations and wishes to proceed with surgery.  PCP: Cathlean Cower, MD   Discharged Condition: good  Hospital Course:  Patient  underwent the above stated procedure on 06/16/2012. Patient tolerated the procedure well and brought to the recovery room in good condition and subsequently to the floor.  POD #1 BP: 146/74 ; Pulse: 79 ; Temp: 98.6 F (37 C) ; Resp: 16  Pt's foley was removed, as well as the hemovac drain removed. IV was changed to a saline lock. Patient reports pain as mild, pain well controled. No events throughout the night. Ready to be discharged home after PT. Neurovascular intact, dorsiflexion/plantar flexion intact, incision: dressing C/D/I, no cellulitis present and compartment soft.   LABS  Basename  06/17/12 0428   HGB  11.1  HCT  32.8    Discharge Exam: General appearance: alert, cooperative and no distress Extremities: Homans sign is negative, no sign of DVT, no edema, redness or tenderness in the calves or thighs and no ulcers, gangrene or trophic changes  Disposition:   Home or Self Care with follow up in 2 weeks   Follow-up Information    Follow up with Mauri Pole, MD. In 2 weeks.   Contact information:   [address] [place] [address] [id number]          Discharge Orders    Future Appointments: Provider: Department: Dept Phone: Center:   11/20/2012 2:30 PM Deneise Lever, MD [facility] [phone] None     Future Orders Please Complete By Expires   Diet - low sodium heart healthy      Call MD / Call 911      Comments:   If you experience chest pain or shortness of breath, CALL 911  and be transported to the hospital emergency room.  If you develope a fever above 101 F, pus (white drainage) or increased drainage or redness at the wound, or calf pain, call your surgeon's office.   Discharge instructions      Comments:   Maintain surgical dressing for 10-14 days, then replace with gauze and tape. Keep the area dry and clean until follow up. Follow up in 2 weeks at [facility]. Call with any questions or concerns.   Constipation  Prevention      Comments:   Drink plenty of fluids.  Prune juice may be helpful.  You may use a stool softener, such as Colace (over the counter) 100 mg twice a day.  Use MiraLax (over the counter) for constipation as needed.   Increase activity slowly as tolerated      Weight bearing as tolerated      Change dressing      Comments:   Maintain surgical dressing for 10-14 days, then replace with 4x4 guaze and tape. Keep the area dry and clean.   TED hose      Comments:   Use stockings (TED hose) for 2 weeks on both leg(s).  You may remove them at night for sleeping.      Discharge Medication List as of 06/17/2012 12:50 PM    START taking these medications   Details  diphenhydrAMINE (BENADRYL) 25 mg capsule Take 1 capsule (25 mg total) by mouth every 6 (six) hours as needed for itching, allergies or sleep., Starting 06/17/2012, Until Discontinued, No Print    docusate sodium 100 MG CAPS Take 100 mg by mouth 2 (two) times daily., Starting 06/17/2012, Until Discontinued, No Print    ferrous sulfate 325 (65 FE) MG tablet Take 1 tablet (325 mg total) by mouth 3 (three) times daily after meals., Starting 06/17/2012, Until Discontinued, No Print    HYDROcodone-acetaminophen (NORCO) 7.5-325 MG per tablet Take 1-2 tablets by mouth every 4 (four) hours as needed for pain., Starting 06/17/2012, Until Discontinued, Print    methocarbamol (ROBAXIN) 500 MG tablet Take 1 tablet (500 mg total) by mouth every 6 (six) hours as needed (muscle spasms)., Starting 06/17/2012, Until Discontinued, No Print    polyethylene glycol (MIRALAX / GLYCOLAX) packet Take 17 g by mouth 2 (two) times daily., Starting 06/17/2012, Until Discontinued, No Print      CONTINUE these medications which have CHANGED   Details  aspirin EC 325 MG tablet Take 1 tablet (325 mg total) by mouth 2 (two) times daily. X 4 weeks, Starting 06/17/2012, Until Discontinued, No Print      CONTINUE these medications which have NOT CHANGED   Details   ACCU-CHEK FASTCLIX LANCETS MISC Apply 1 application topically daily., Starting 07/05/2011, Until Sat 07/04/12, Historical Med    albuterol (PROVENTIL HFA;VENTOLIN HFA) 108 (90 BASE) MCG/ACT inhaler Inhale 2 puffs into the lungs 4 (four) times daily as needed. For shortness of breath., Starting 07/05/2011, Until Sat 07/04/12, Historical Med    atorvastatin (LIPITOR) 10 MG tablet Take 10 mg by mouth every evening. , Until Discontinued, Historical Med    carbamazepine (TEGRETOL) 200 MG tablet TAKE 2 TABLETS AT BEDTIME, Normal    Cholecalciferol (VITAMIN D3) 2000 UNITS TABS Take 6,000 Units by mouth every morning., Until Discontinued, Historical Med    citalopram (CELEXA) 10 MG tablet Take 10 mg by mouth at bedtime., Starting 12/20/2011, Until Sat 12/19/12, Historical Med    clobetasol cream (TEMOVATE) [id number] % Apply 1 application topically 2 (  two) times daily as needed. For eczema., Until Discontinued, Historical Med    desonide (DESOWEN) 0.05 % lotion Apply 1 application topically daily as needed. For groin rash., Starting 04/10/2011, Until Discontinued, Historical Med    fluticasone (FLONASE) 50 MCG/ACT nasal spray Place 2 sprays into the nose daily as needed. For allergies., Starting 07/05/2011, Until Sat 07/04/12, Historical Med    glucosamine-chondroitin 500-400 MG tablet Take 1 tablet by mouth every morning. , Until Discontinued, Historical Med    glucose blood test strip Use as instructed 1  Per day  250.02, Historical Med    hydrochlorothiazide (HYDRODIURIL) 25 MG tablet Take 25 mg by mouth every morning., Starting 07/05/2011, Until Discontinued, Historical Med    hydrocortisone (PROCTOSOL HC) 2.5 % rectal cream Place 1 application rectally 2 (two) times daily as needed. For hemorrhoids., Until Discontinued, Historical Med    levothyroxine (SYNTHROID, LEVOTHROID) 100 MCG tablet Take 100 mcg by mouth at bedtime., Starting 07/05/2011, Until Discontinued, Historical Med    Multiple Vitamin (MULTIVITAMIN)  tablet Take 1 tablet by mouth every morning. , Until Discontinued, Historical Med    olmesartan (BENICAR) 20 MG tablet Take 20 mg by mouth at bedtime., Starting 07/05/2011, Until Discontinued, Historical Med    pantoprazole (PROTONIX) 40 MG tablet Take 40 mg by mouth at bedtime., Starting 07/05/2011, Until Discontinued, Historical Med      STOP taking these medications     meloxicam (MOBIC) 15 MG tablet Comments:  Reason for Stopping:           Signed: West Pugh. Shareece Bultman   PAC  06/17/2012, 6:15 PM

## 2012-06-17 NOTE — Evaluation (Signed)
Occupational Therapy Evaluation Patient Details Name: Joseph Villa MRN: [id number] DOB: 18-Jul-1940 Today's Date: 06/17/2012 Time: [id number] OT Time Calculation (min): 17 min  OT Assessment / Plan / Recommendation Clinical Impression  Pt doing well POD 1 LTHR. All education completed. Pt will have necessary level of A from family at d/c.    OT Assessment  Patient does not need any further OT services    Follow Up Recommendations  No OT follow up    Barriers to Discharge      Equipment Recommendations  None recommended by OT    Recommendations for Other Services    Frequency       Precautions / Restrictions Precautions Precautions: Fall Restrictions Weight Bearing Restrictions: No Other Position/Activity Restrictions: WBAT   Pertinent Vitals/Pain Pt denied pain.    ADL  Grooming: Supervision/safety Where Assessed - Grooming: Supported standing Upper Body Bathing: Set up Where Assessed - Upper Body Bathing: Unsupported sitting Lower Body Bathing: Minimal assistance Where Assessed - Lower Body Bathing: Supported sit to stand Upper Body Dressing: Set up Where Assessed - Upper Body Dressing: Unsupported sitting Lower Body Dressing: Minimal assistance Where Assessed - Lower Body Dressing: Supported sit to stand Toilet Transfer: Minimal assistance Toilet Transfer Method: Sit to Loss adjuster, chartered: Comfort height toilet;Grab bars Toileting - Water quality scientist and Hygiene: Supervision/safety Where Assessed - Best boy and Hygiene: Sit to stand from 3-in-1 or toilet Tub/Shower Transfer: Supervision/safety Tub/Shower Transfer Method: Ambulating Equipment Used: Rolling walker Transfers/Ambulation Related to ADLs: Pt ambulated to the bathroom with supervision. ADL Comments: Advised pt to use 3n1 over toilet. Pt struggled to sit on comfort height toilet and needed grab bar to stand. Instructed pt how to safely step into shower with good  return demo. Pt able to don pants without physical A. A needed for socks.    OT Diagnosis:    OT Problem List:   OT Treatment Interventions:     OT Goals    Visit Information  Last OT Received On: 06/17/12 Assistance Needed: +1    Subjective Data  Subjective: I'm going home today. Patient Stated Goal: Not asked.   Prior Functioning     Home Living Lives With: Spouse Available Help at Discharge: Family Type of Home: House Home Access: Stairs to enter Technical brewer of Steps: 4+1 Entrance Stairs-Rails: Right Home Layout: One level Bathroom Shower/Tub: Multimedia programmer:  (comfort height) Home Adaptive Equipment: Bedside commode/3-in-1;Walker - rolling Prior Function Level of Independence: Independent Able to Take Stairs?: Yes Driving: Yes Vocation: Retired Corporate investment banker: [place] Hand: Right         Vision/Perception     Cognition  Overall Cognitive Status: Appears within functional limits for tasks assessed/performed Arousal/Alertness: Awake/alert Orientation Level: Appears intact for tasks assessed Behavior During Session: [facility] for tasks performed    Extremity/Trunk Assessment Right Upper Extremity Assessment RUE ROM/Strength/Tone: [facility] for tasks assessed Left Upper Extremity Assessment LUE ROM/Strength/Tone: WFL for tasks assessed     Mobility Transfers Sit to Stand: 5: Supervision;With upper extremity assist;4: Min guard;From chair/3-in-1;From toilet;With armrests Stand to Sit: 5: Supervision;With upper extremity assist;With armrests;To chair/3-in-1;To toilet Details for Transfer Assistance: cues for LE management and use of UEs to self assist     Shoulder Instructions     Exercise     Balance     End of Session OT - End of Session Activity Tolerance: Patient tolerated treatment well Patient left: in chair;with call bell/phone within reach;with family/visitor present  GO  Ameri Cahoon A OTR/L  [id number] 06/17/2012, 3:01 PM

## 2012-06-17 NOTE — Evaluation (Signed)
Physical Therapy Evaluation Patient Details Name: Joseph Villa MRN: [id number] DOB: 06/01/1941 Today's Date: 06/17/2012 Time: [id number] PT Time Calculation (min): 36 min  PT Assessment / Plan / Recommendation Clinical Impression  Pt s/p L THR presents with decreased L LE strength/ROM and post op pain limiting functional mobility    PT Assessment  Patient needs continued PT services    Follow Up Recommendations  Home health PT    Does the patient have the potential to tolerate intense rehabilitation      Barriers to Discharge None      Equipment Recommendations  None recommended by PT    Recommendations for Other Services OT consult   Frequency 7X/week    Precautions / Restrictions Precautions Precautions: Fall Restrictions Weight Bearing Restrictions: No Other Position/Activity Restrictions: WBAT   Pertinent Vitals/Pain 2-3/10; premedicated      Mobility  Bed Mobility Bed Mobility: Supine to Sit Supine to Sit: 4: Min assist Details for Bed Mobility Assistance: Pt log rolled to get out of bed - unable to sit trunk up from supine 2* to heavy abdomen Transfers Transfers: Sit to Stand;Stand to Sit Sit to Stand: 4: Min assist Stand to Sit: 4: Min assist Details for Transfer Assistance: cues for LE management and use of UEs to self assist Ambulation/Gait Ambulation/Gait Assistance: 4: Min assist Ambulation Distance (Feet): 138 Feet Assistive device: Rolling walker Ambulation/Gait Assistance Details: cues for initial sequence, posture and position from RW Gait Pattern: Step-to pattern;Step-through pattern    Shoulder Instructions     Exercises Total Joint Exercises Ankle Circles/Pumps: AROM;Both;10 reps;Supine Quad Sets: AROM;10 reps;Both;Supine Heel Slides: AAROM;10 reps;Supine;Left Hip ABduction/ADduction: AAROM;Left;10 reps;Supine   PT Diagnosis: Difficulty walking  PT Problem List: Decreased strength;Decreased range of motion;Decreased activity  tolerance;Decreased mobility;Pain;Decreased knowledge of use of DME PT Treatment Interventions: DME instruction;Gait training;Stair training;Functional mobility training;Therapeutic activities;Therapeutic exercise;Patient/family education   PT Goals Acute Rehab PT Goals PT Goal Formulation: With patient Time For Goal Achievement: 06/20/12 Potential to Achieve Goals: Good Pt will go Supine/Side to Sit: with supervision PT Goal: Supine/Side to Sit - Progress: Goal set today Pt will go Sit to Supine/Side: with supervision PT Goal: Sit to Supine/Side - Progress: Goal set today Pt will go Sit to Stand: with supervision PT Goal: Sit to Stand - Progress: Goal set today Pt will go Stand to Sit: with supervision PT Goal: Stand to Sit - Progress: Goal set today Pt will Ambulate: >150 feet;with supervision;with rolling walker PT Goal: Ambulate - Progress: Goal set today Pt will Go Up / Down Stairs: 3-5 stairs;with min assist;with least restrictive assistive device PT Goal: Up/Down Stairs - Progress: Goal set today  Visit Information  Last PT Received On: 06/17/12 Assistance Needed: +1    Subjective Data  Subjective: I had bone spurs that were very painful if I moved wrong Patient Stated Goal: Resume previous lifestyle with decreased pain   Prior Functioning  Home Living Lives With: Spouse Available Help at Discharge: Family Type of Home: House Home Access: Stairs to enter Technical brewer of Steps: 4+1 Entrance Stairs-Rails: Right Home Layout: One level Home Adaptive Equipment: Walker - rolling Prior Function Level of Independence: Independent Able to Take Stairs?: Yes Driving: Yes Vocation: Retired Corporate investment banker: Barista  Overall Cognitive Status: Appears within functional limits for tasks assessed/performed Arousal/Alertness: Awake/alert Orientation Level: Appears intact for tasks assessed Behavior During Session: [facility] for tasks performed      Extremity/Trunk Assessment Right Upper Extremity Assessment RUE ROM/Strength/Tone: [facility] for  tasks assessed Left Upper Extremity Assessment LUE ROM/Strength/Tone: WFL for tasks assessed Right Lower Extremity Assessment RLE ROM/Strength/Tone: [facility] for tasks assessed Left Lower Extremity Assessment LLE ROM/Strength/Tone: Deficits LLE ROM/Strength/Tone Deficits: 2+/5 hip strength with AAROM at hip to 75 flex and 20 abd   Balance    End of Session PT - End of Session Equipment Utilized During Treatment: Gait belt Activity Tolerance: Patient tolerated treatment well Patient left: in chair;with call bell/phone within reach;with family/visitor present Nurse Communication: Mobility status  GP     Joseph Villa 06/17/2012, 12:16 PM

## 2012-06-17 NOTE — Progress Notes (Signed)
Physical Therapy Treatment Patient Details Name: Joseph Villa MRN: [id number] DOB: 31-Jul-1940 Today's Date: 06/17/2012 Time: [id number] PT Time Calculation (min): 18 min  PT Assessment / Plan / Recommendation Comments on Treatment Session  Reviewed car transfers with pt and family    Follow Up Recommendations  Home health PT     Does the patient have the potential to tolerate intense rehabilitation     Barriers to Discharge        Equipment Recommendations  None recommended by PT    Recommendations for Other Services OT consult  Frequency 7X/week   Plan Discharge plan remains appropriate    Precautions / Restrictions Precautions Precautions: Fall Restrictions Weight Bearing Restrictions: No Other Position/Activity Restrictions: WBAT   Pertinent Vitals/Pain "its just sore"    Mobility  Transfers Transfers: Sit to Stand;Stand to Sit Sit to Stand: 5: Supervision Stand to Sit: 4: Min guard Details for Transfer Assistance: cues for LE management and use of UEs to self assist Ambulation/Gait Ambulation/Gait Assistance: 4: Min guard;5: Supervision Ambulation Distance (Feet): 184 Feet Assistive device: Rolling walker Ambulation/Gait Assistance Details: min cues for posture and position from RW Gait Pattern: Step-through pattern Stairs: Yes Stairs Assistance: 4: Min assist Stairs Assistance Details (indicate cue type and reason): min cues for sequence and foot placement Stair Management Technique: One rail Right;Forwards;With cane;Step to pattern Number of Stairs: 4  (twice; 4 with cane and rail and 4 with rail and HHA)    Exercises     PT Diagnosis:    PT Problem List:   PT Treatment Interventions:     PT Goals Acute Rehab PT Goals PT Goal Formulation: With patient Time For Goal Achievement: 06/20/12 Potential to Achieve Goals: Good Pt will go Supine/Side to Sit: with supervision PT Goal: Supine/Side to Sit - Progress: Goal set today Pt will go Sit to  Supine/Side: with supervision PT Goal: Sit to Supine/Side - Progress: Goal set today Pt will go Sit to Stand: with supervision PT Goal: Sit to Stand - Progress: Progressing toward goal Pt will go Stand to Sit: with supervision PT Goal: Stand to Sit - Progress: Progressing toward goal Pt will Ambulate: >150 feet;with supervision;with rolling walker PT Goal: Ambulate - Progress: Met Pt will Go Up / Down Stairs: 3-5 stairs;with min assist;with least restrictive assistive device PT Goal: Up/Down Stairs - Progress: Met  Visit Information  Last PT Received On: 06/17/12 Assistance Needed: +1    Subjective Data  Subjective: I'm going home as soon as you are done with me Patient Stated Goal: Resume previous lifestyle with decreased pain   Cognition  Overall Cognitive Status: Appears within functional limits for tasks assessed/performed Arousal/Alertness: Awake/alert Orientation Level: Appears intact for tasks assessed Behavior During Session: [facility] for tasks performed    Balance     End of Session PT - End of Session Activity Tolerance: Patient tolerated treatment well Patient left: in chair;with call bell/phone within reach;with family/visitor present Nurse Communication: Mobility status   GP     Teri Legacy 06/17/2012, 2:51 PM

## 2012-06-17 NOTE — Progress Notes (Signed)
Pt set up with CPAP with home mask , tubing and home settings of 9cmH2O.

## 2012-06-17 NOTE — Progress Notes (Signed)
   Subjective: 1 Day Post-Op Procedure(s) (LRB): TOTAL HIP ARTHROPLASTY ANTERIOR APPROACH (Left)   Patient reports pain as mild, pain well controled. No events throughout the night. Ready to be discharged home after PT.  Objective:   VITALS:   Filed Vitals:   06/17/12 0603  BP: 146/74  Pulse: 79  Temp: 98.6 F (37 C)  Resp: 16    Neurovascular intact Dorsiflexion/Plantar flexion intact Incision: dressing C/D/I No cellulitis present Compartment soft  LABS  Basename 06/17/12 0428  HGB 11.1*  HCT 32.8*  WBC 8.8  PLT 137*     Basename 06/17/12 0428 06/16/12 0959  NA 137 140  K 3.5 3.7  BUN 21 35*  CREATININE 1.20 1.21  GLUCOSE 118* 140*     Assessment/Plan: 1 Day Post-Op Procedure(s) (LRB): TOTAL HIP ARTHROPLASTY ANTERIOR APPROACH (Left) HV drain d/c'ed Foley cath d/c'ed Advance diet Up with therapy D/C IV fluids Discharge home with home health  Expected ABLA  Treated with iron and will observe  Obese (BMI 30-39.9)  Estimated Body mass index is 35.61 kg/(m^2) as calculated from the following:   Height as of this encounter: 5\' 11" (1.803 m).   Weight as of this encounter: 255 lb 5 oz(115.809 kg). Patient also counseled that weight may inhibit the healing process Patient counseled that losing weight will help with future health issues      West Pugh. Carrieanne Kleen   PAC  06/17/2012, 9:32 AM

## 2012-06-17 NOTE — Care Management Note (Signed)
    Page 1 of 2   06/17/2012     5:47:21 PM   CARE MANAGEMENT NOTE 06/17/2012  Patient:  COAST, FRANCIA   Account Number:  [id number]  Date Initiated:  06/17/2012  Documentation initiated by:  Sherrin Daisy  Subjective/Objective Assessment:   DX Left hip osteoaqrthritis; total hip replacemnt-anterior approach     Action/Plan:   Cm spoke with patient and family members. Plans are for patient to return to his home in [place], [place] where spouse and daughter will be caregivers. He already has RW and tiolet seat in [place] is elevated. Wants [place] agency that is in network   Anticipated DC Date:  06/17/2012   Anticipated DC Plan:  [place] referral  NA      DC Planning Services  CM consult      [facility] Choice  HOME HEALTH   Choice offered to / List presented to:  C-1 Patient   DME arranged  NA      DME agency  NA     [place] arranged  HH-2 PT      [place] agency  [facility]   Status of service:  Completed, signed off Medicare Important Message given?  NA - LOS <3 / Initial given by admissions (If response is "NO", the following Medicare IM given date fields will be blank) Date Medicare IM given:   Date Additional Medicare IM given:    Discharge Disposition:  [place]  Per UR Regulation:  Reviewed for med. necessity/level of care/duration of stay  If discussed at [place] of Stay Meetings, dates discussed:    Comments:  06/17/2012 Sherrin Daisy BSN RN CCM [phone] [facility] contacted; spoke with Judson Roch who advised that [facility] could provide [facility] services with start date of 48hrs within discharge date. Faxed face sheet, h& p, op note, [place] orders, face to face to [phone]. Confirmation received. Pt advised of [place] agency contact information; voices understanding.

## 2012-06-18 LAB — GLUCOSE, CAPILLARY: Glucose-Capillary: 161 mg/dL — ABNORMAL HIGH (ref 70–99)

## 2012-06-26 ENCOUNTER — Ambulatory Visit: Payer: Medicare Other | Admitting: Internal Medicine

## 2012-06-29 ENCOUNTER — Telehealth: Payer: Self-pay | Admitting: Internal Medicine

## 2012-06-29 NOTE — Telephone Encounter (Signed)
Patient Information:  Caller Name: Arbie Cookey  Phone: [phone]  Patient: Joseph Villa, Joseph Villa  Gender: Male  DOB: 19-Jun-1940  Age: 72 Years  PCP: Cathlean Cower (Adults only)  Office Follow Up:  Does the office need to follow up with this patient?: Yes  Instructions For The Office: Review with MD per Guideline  RN Note:  Explained to wife that if sx change or worsen to call back; explained that message will be sent to office  Symptoms  Reason For Call & Symptoms: Wife is calling and states that pt had hip replacement on 06/16/12; was doing well until this morning he started having dizziness; BGM 160 this morning; laid down in chair and room was spinning; ate breakfast and feeling okay since;  no sx for the last 3-4 hours; had PT at home today with the therapist and did okay; not doing much but no dizziness;  no sx at present time; PT instructed family that vitals and oxygen levels were wnl for pt; BP 138/70 and O2 97% on room air at that time; drinking plenty of fluids  Reviewed Health History In EMR: Yes  Reviewed Medications In EMR: Yes  Reviewed Allergies In EMR: Yes  Reviewed Surgeries / Procedures: Yes  Date of Onset of Symptoms: 06/29/2012  Guideline(s) Used:  Dizziness  Disposition Per Guideline:   Discuss with PCP and Callback by Nurse Today  Reason For Disposition Reached:   Diabetes  Advice Given:  Some Causes of Temporary Dizziness:  Standing Up Suddenly - Standing up suddenly (especially getting out of bed) or prolonged standing in one place are common causes of temporary dizziness. Not drinking enough fluids always makes it worse. Certain medications can cause or increase this type of dizziness (e.g., blood pressure medications).  Rest for 1-2 Hours:  Lie down with feet elevated for 1 hour. This will improve blood flow and increase blood flow to the brain.  Stand Up Slowly:  In the mornings, sit up for a few minutes before you stand up. That will help your blood flow make  the adjustment.  If you have to stand up for long periods of time, contract and relax your leg muscles to help pump the blood back to the heart.  Sit down or lie down if you feel dizzy.  Call Back If:  You become worse.

## 2012-06-29 NOTE — Telephone Encounter (Signed)
Appt at 11:00 on Jan 28.  No dizziness after 9 am today.

## 2012-06-29 NOTE — Telephone Encounter (Signed)
To nancy  - ok to offer appt with me or regina

## 2012-06-30 ENCOUNTER — Ambulatory Visit (INDEPENDENT_AMBULATORY_CARE_PROVIDER_SITE_OTHER): Payer: Medicare Other | Admitting: Internal Medicine

## 2012-06-30 ENCOUNTER — Encounter: Payer: Self-pay | Admitting: Internal Medicine

## 2012-06-30 VITALS — BP 112/60 | HR 70 | Temp 98.5°F | Ht 71.5 in | Wt 251.5 lb

## 2012-06-30 DIAGNOSIS — I10 Essential (primary) hypertension: Secondary | ICD-10-CM

## 2012-06-30 DIAGNOSIS — R42 Dizziness and giddiness: Secondary | ICD-10-CM | POA: Insufficient documentation

## 2012-06-30 DIAGNOSIS — IMO0002 Reserved for concepts with insufficient information to code with codable children: Secondary | ICD-10-CM | POA: Insufficient documentation

## 2012-06-30 DIAGNOSIS — H8110 Benign paroxysmal vertigo, unspecified ear: Secondary | ICD-10-CM

## 2012-06-30 DIAGNOSIS — D500 Iron deficiency anemia secondary to blood loss (chronic): Secondary | ICD-10-CM

## 2012-06-30 MED ORDER — MECLIZINE HCL 12.5 MG PO TABS: 12.5000 mg | ORAL_TABLET | Freq: Three times a day (TID) | ORAL | Status: DC | PRN

## 2012-06-30 NOTE — Assessment & Plan Note (Deleted)
Etiology unclear, exam bening today,  to f/u any worsening symptoms or concerns, gave meclziine for persistent recurrent symptoms

## 2012-06-30 NOTE — Progress Notes (Signed)
Subjective:    Patient ID: Joseph Villa, male    DOB: [birth year], 72 y.o.   MRN: [id number]  HPI  Here with wife for f/u with acute issue - Yesterday AM with 2 hrs of positional vertiginous type dizziness intermittent, mild to mod, occurred about 230am soon after taking a narcotic pain pill, now 2 wks s/p surgury; happened after turning to the side in bed and "the world spun."  Later yest am had some similar with letting putting birdfeed in the cage with bending over.  CBG at the time ok. Pt denies chest pain, increased sob or doe, wheezing, orthopnea, PND, increased LE swelling, palpitations, dizziness or syncope.  Pt denies new neurological symptoms such as new headache, or facial or extremity weakness or numbness  Pt denies polydipsia, polyuria.  2 wks s/o hip surgury.   Pt denies fever, wt loss, night sweats, loss of appetite, or other constitutional symptoms. No overt bleeding or bruising post op Past Medical History  Diagnosis Date  . ALLERGIC ASTHMA 11/25/2007  . ALLERGIC RHINITIS 11/25/2007  . BIPOLAR DISORDER UNSPECIFIED 10/06/2009  . COLONIC POLYPS, HX OF 10/06/2009  . DM w/o Complication Type II 123456  . GERD 10/06/2009  . HYPERLIPIDEMIA 10/06/2009  . HYPERTENSION, BENIGN 07/27/2009  . HYPOTHYROIDISM 10/06/2009  . NEPHROLITHIASIS, HX OF 10/06/2009  . OBSTRUCTIVE SLEEP APNEA 11/25/2007    cpap setting of 9  . DJD (degenerative joint disease)   . Degenerative arthritis of hip 04/18/2011  . Arthritis, lumbar spine 04/18/2011  . Hemorrhoid   . Hemorrhage yrs ago    after goiter surgery, tracheostomy inserted and later removed   Past Surgical History  Procedure Date  . Ganglion cyst excision   . Goiter   . Tonsillectomy   . Hernia repair several yrs ago  . Total hip arthroplasty 06/16/2012    Procedure: TOTAL HIP ARTHROPLASTY ANTERIOR APPROACH;  Surgeon: Mauri Pole, MD;  Location: [facility];  Service: Orthopedics;  Laterality: Left;    reports that he quit smoking about 35 years ago.  His smoking use included Cigarettes and Pipe. He has a 22.5 pack-year smoking history. He has never used smokeless tobacco. He reports that he does not drink alcohol or use illicit drugs. family history includes Alcohol abuse in his other and Cancer in his mother. Allergies  Allergen Reactions  . Cefuroxime Axetil Anaphylaxis    REACTION: anaphylaxis  . Cefprozil     REACTION: unknown  . Rabeprazole Sodium Nausea Only    REACTION: nausea   Current Outpatient Prescriptions on File Prior to Visit  Medication Sig Dispense Refill  . ACCU-CHEK FASTCLIX LANCETS MISC Apply 1 application topically daily.      Marland Kitchen albuterol (PROVENTIL HFA;VENTOLIN HFA) 108 (90 BASE) MCG/ACT inhaler Inhale 2 puffs into the lungs 4 (four) times daily as needed. For shortness of breath.      Marland Kitchen aspirin EC 325 MG tablet Take 1 tablet (325 mg total) by mouth 2 (two) times daily. X 4 weeks  60 tablet  0  . atorvastatin (LIPITOR) 10 MG tablet Take 10 mg by mouth every evening.       . carbamazepine (TEGRETOL) 200 MG tablet TAKE 2 TABLETS AT BEDTIME  180 tablet  1  . Cholecalciferol (VITAMIN D3) 2000 UNITS TABS Take 6,000 Units by mouth every morning.      . citalopram (CELEXA) 10 MG tablet Take 10 mg by mouth at bedtime.      . clobetasol cream (TEMOVATE) 0.05 % Apply  1 application topically 2 (two) times daily as needed. For eczema.      Marland Kitchen desonide (DESOWEN) 0.05 % lotion Apply 1 application topically daily as needed. For groin rash.      . diphenhydrAMINE (BENADRYL) 25 mg capsule Take 1 capsule (25 mg total) by mouth every 6 (six) hours as needed for itching, allergies or sleep.  30 capsule    . docusate sodium 100 MG CAPS Take 100 mg by mouth 2 (two) times daily.  10 capsule    . ferrous sulfate 325 (65 FE) MG tablet Take 1 tablet (325 mg total) by mouth 3 (three) times daily after meals.      . fluticasone (FLONASE) 50 MCG/ACT nasal spray Place 2 sprays into the nose daily as needed. For allergies.      Marland Kitchen  glucosamine-chondroitin 500-400 MG tablet Take 1 tablet by mouth every morning.       Marland Kitchen glucose blood test strip Use as instructed 1  Per day  250.02      . hydrochlorothiazide (HYDRODIURIL) 25 MG tablet Take 25 mg by mouth every morning.      Marland Kitchen HYDROcodone-acetaminophen (NORCO) 7.5-325 MG per tablet Take 1-2 tablets by mouth every 4 (four) hours as needed for pain.  120 tablet  0  . hydrocortisone (PROCTOSOL HC) 2.5 % rectal cream Place 1 application rectally 2 (two) times daily as needed. For hemorrhoids.      Marland Kitchen levothyroxine (SYNTHROID, LEVOTHROID) 100 MCG tablet Take 100 mcg by mouth at bedtime.      . methocarbamol (ROBAXIN) 500 MG tablet Take 1 tablet (500 mg total) by mouth every 6 (six) hours as needed (muscle spasms).      . Multiple Vitamin (MULTIVITAMIN) tablet Take 1 tablet by mouth every morning.       . olmesartan (BENICAR) 20 MG tablet Take 20 mg by mouth at bedtime.      . pantoprazole (PROTONIX) 40 MG tablet Take 40 mg by mouth at bedtime.      . polyethylene glycol (MIRALAX / GLYCOLAX) packet Take 17 g by mouth 2 (two) times daily.  14 each     Review of Systems  Constitutional: Negative for unexpected weight change, or unusual diaphoresis  HENT: Negative for tinnitus.   Eyes: Negative for photophobia and visual disturbance.  Respiratory: Negative for choking and stridor.   Gastrointestinal: Negative for vomiting and blood in stool.  Genitourinary: Negative for hematuria and decreased urine volume.  Musculoskeletal: Negative for acute joint swelling Skin: Negative for color change and wound.  Neurological: Negative for tremors and numbness other than noted  Psychiatric/Behavioral: Negative for decreased concentration or  hyperactivity.       Objective:   Physical Exam BP 112/60  Pulse 70  Temp 98.5 F (36.9 C) (Oral)  Ht 5' 11.5" (1.816 m)  Wt 251 lb 8 oz (114.08 kg)  BMI 34.59 kg/m2  SpO2 96% VS noted, not ill appearing Constitutional: Pt appears well-developed  and well-nourished.  HENT: Head: NCAT.  Right Ear: External ear normal.  Left Ear: External ear normal.  Eyes: Conjunctivae and EOM are normal. Pupils are equal, round, and reactive to light.  Neck: Normal range of motion. Neck supple.  Cardiovascular: Normal rate and regular rhythm.   Pulmonary/Chest: Effort normal and breath sounds normal.  Abd:  Soft, NT, non-distended, + BS, no flank tender Neurological: Pt is alert. Not confused , motor/gait intact, walking well and no assist needed to get up on exam table. Skin: Skin  is warm. No erythema. No rash Psychiatric: Pt behavior is normal. Thought content normal. 1+ nervous    Assessment & Plan:

## 2012-06-30 NOTE — Assessment & Plan Note (Signed)
ECG reviewed as per emr,  

## 2012-06-30 NOTE — Patient Instructions (Addendum)
Please take all new medication as prescribed Please continue all other medications as before, and refills have been done if requested. Please keep your appointments with your specialists as you have planned - orthopedic Thank you for enrolling in [facility]. Please follow the instructions below to securely access your online medical record. MyChart allows you to send messages to your doctor, view your test results, renew your prescriptions, schedule appointments, and more Thank you for enrolling in [facility]. Please follow the instructions below to securely access your online medical record. MyChart allows you to send messages to your doctor, view your test results, renew your prescriptions, schedule appointments, and more.  To Log into MyChart, please go to [email or web address], and your Username is: [employer], research (life sciences))

## 2012-06-30 NOTE — Assessment & Plan Note (Signed)
ECG reviewed as per emr,  to f/u any worsening symptoms or concerns

## 2012-06-30 NOTE — Assessment & Plan Note (Signed)
I offered cbc but he decliens

## 2012-07-01 ENCOUNTER — Telehealth: Payer: Self-pay | Admitting: Internal Medicine

## 2012-07-01 DIAGNOSIS — G4733 Obstructive sleep apnea (adult) (pediatric): Secondary | ICD-10-CM

## 2012-07-01 NOTE — Telephone Encounter (Signed)
I spoke with carol. She stated pt CPAP pressure is at 10 CM. Pt stated it is too much. The air is seeping around the nose piece and he is finding he is sleeping with his mouth open. He is wanting to be changed back to 9. Please advise Dr. Annamaria Boots thanks Last OV 05/22/12 Pending 11/20/12

## 2012-07-01 NOTE — Telephone Encounter (Signed)
Per CY-okay to place order to DME for decreasing CPAP pressure to 9. Order placed to [facility] and patients wife is aware.

## 2012-07-15 ENCOUNTER — Other Ambulatory Visit: Payer: Self-pay | Admitting: Internal Medicine

## 2012-07-24 ENCOUNTER — Other Ambulatory Visit: Payer: Self-pay | Admitting: Internal Medicine

## 2012-07-24 NOTE — Telephone Encounter (Signed)
refill 

## 2012-07-28 DIAGNOSIS — R269 Unspecified abnormalities of gait and mobility: Secondary | ICD-10-CM

## 2012-07-28 DIAGNOSIS — M6281 Muscle weakness (generalized): Secondary | ICD-10-CM

## 2012-07-28 DIAGNOSIS — I10 Essential (primary) hypertension: Secondary | ICD-10-CM

## 2012-07-28 DIAGNOSIS — M169 Osteoarthritis of hip, unspecified: Secondary | ICD-10-CM

## 2012-07-28 DIAGNOSIS — G589 Mononeuropathy, unspecified: Secondary | ICD-10-CM

## 2012-07-31 ENCOUNTER — Other Ambulatory Visit: Payer: Self-pay | Admitting: Internal Medicine

## 2012-08-17 ENCOUNTER — Ambulatory Visit (INDEPENDENT_AMBULATORY_CARE_PROVIDER_SITE_OTHER): Payer: Medicare Other | Admitting: Internal Medicine

## 2012-08-17 ENCOUNTER — Encounter: Payer: Self-pay | Admitting: Internal Medicine

## 2012-08-17 VITALS — BP 126/70 | HR 68 | Ht 70.5 in | Wt 257.6 lb

## 2012-08-17 DIAGNOSIS — J45909 Unspecified asthma, uncomplicated: Secondary | ICD-10-CM

## 2012-08-17 DIAGNOSIS — J209 Acute bronchitis, unspecified: Secondary | ICD-10-CM | POA: Insufficient documentation

## 2012-08-17 NOTE — Assessment & Plan Note (Signed)
Continued successful compliant with CPAP 9

## 2012-08-17 NOTE — Assessment & Plan Note (Signed)
URI with bronchitis, began viral but with potential for bacterial over growth. I'm not strongly concerned about joint infection, but respect the issue. .  Plan- finish the Zpak and saline nasal rinse. Consider mucinex.Call us if fails to clear.

## 2012-08-17 NOTE — Progress Notes (Signed)
04/23/11- 70 yoM former smoker, followed for allergic rhinitis, asthma, complicated by DM, Bipolar, GERD LOV-04/24/2010 Has had flu vaccine and TDAP He has done well over the past year, off of allergy vaccine Continues CPAP at 9 CWP for his sleep apnea/[employer]. Asthma causes no problems unless he is in a very dusty environment and even then it is mild. He is no longer using Advair. Rarely uses rescue inhaler. Does use Flonase for rhinitis.  11/15/11- 70 yoM former smoker, followed for allergic rhinitis, asthma, complicated by DM, Bipolar, GERD Sleep apnea control remains good at 9 CWP/ [facility]. Uses chin strap. Still gets dry mouth if lying on his back. Dyspnea with exertion is stable and chronic. No cough. 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 CXR 05/09/11- reviewed with him IMPRESSION:  1. Some interval increase in linear lingular scarring or  atelectasis.  Original Report Authenticated By: Trecia Rogers, M.D.    05/22/12- 71 yoM former smoker, followed for allergic rhinitis, asthma, complicated by DM, Bipolar, GERD FOLLOWS FOR: wears CPAP every night from 1130pm to 12am to 6-7am; still having episodes of waking up and feels like something is going to happen(anxiety) having more stress. Needs surgery clearance for hip surgery CPAP 9/[facility] with good compliance and control. He gets some dry mouth and we discussed the humidifier. He will take his own mask to the hospital her surgery and they will supply the machine. He had been going to a gym regularly 3 times a week for many years, recently limited by his hip pain. Little cough with no wheeze or phlegm. Still notices dyspnea on exertion, probably unchanged over 8-10 years. He says he is short of breath walking one block on level ground but he had been able to exercise hard at the gym. CXR 11/14/11-reviewed and compared with earlier images IMPRESSION:  Chronic left basilar  atelectasis versus scarring.  Mild chronic bronchitic and interstitial lung disease changes.  Original Report Authenticated By: Burnetta Sabin, M.D.  Office Spirometry 05/22/12-normal spirometry. FVC 3.85/83%, FEV1 2.88/81%, FEV1/FVC 0.75, FEF 25-75% 2.17/69%.  08/17/12- 71 yoM former smoker, followed for allergic rhinitis, asthma, complicated by DM, Bipolar, GERD ACUTE VISIT: cough-"gray colored mucus with green sinus" x 5 days; started zpak 3 days ago. Cough gray, nasal green. Eye itches,  But no fever, HA, malaise, blood.  Grandson visited, bringing a cold. Using Afrin- discussed. Had hip replacement and is concerned about potential joint infection if respiratory infection lingers.  Doing fine with CPAP 9/ [facility].  CXR 06/02/12-  IMPRESSION:  Stable exam. Basilar atelectasis versus scarring.  Original Report Authenticated By: Jerilynn Mages. Shick, M.D.   ROS-see HPI Constitutional:   No-   weight loss, night sweats, fevers, chills, fatigue, lassitude. HEENT:   No-  headaches, difficulty swallowing, tooth/dental problems, sore throat,       No-  Sneezing,+ itching, ear ache, +nasal congestion, +post nasal drip,  CV:  No-   chest pain, orthopnea, PND, swelling in lower extremities, anasarca, dizziness, palpitations Resp: No-shortness of breath with exertion or at rest.              +  productive cough,  No non-productive cough,  No- coughing up of blood.              +   change in color of mucus.  No- wheezing.   Skin: No-   rash or lesions. GI:  No-   heartburn, indigestion, abdominal  pain, nausea, vomiting,  GU: . MS:  No-   joint pain or swelling.  Neuro-     nothing unusual Psych:  No- change in mood or affect. No depression or anxiety.  No memory loss.  OBJ  BP 126/70  Pulse 68  Ht 5' 10.5" (1.791 m)  Wt 257 lb 9.6 oz (116.847 kg)  BMI 36.43 kg/m2  SpO2 93% General- Alert, Oriented, Affect-appropriate, Distress- none acute, mild hoarseness. Heavy set/ muscular Skin- rash-none, lesions-  none, excoriation- none Lymphadenopathy- none Head- atraumatic            Eyes- Gross vision intact, PERRLA, conjunctivae clear secretions            Ears- +Hearing aids            Nose- Clear, no-Septal dev, mucus, polyps, erosion, perforation             Throat- Mallampati IV , mucosa clear , drainage- none, tonsils present Neck- flexible , trachea midline, no stridor , thyroid nl, carotid no bruit Chest - symmetrical excursion , unlabored           Heart/CV- RRR , no murmur , no gallop  , no rub, nl s1 s2                           - JVD- none , edema- none, stasis changes- none, varices- none           Lung- clear to P&A, wheeze- none, cough+ light , dullness-none, rub- none           Chest wall-  Abd-  Br/ Gen/ Rectal- Not done, not indicated Extrem- cyanosis- none, clubbing, none, atrophy- none, strength- nl Neuro- grossly intact to observation

## 2012-08-17 NOTE — Patient Instructions (Addendum)
Finish your Zpak and continue supportive care- fluids, saline rinse, maybe Mucinex.  If you aren't doing well by the end of the Zpak, please let us know.   Ok to continue CPAP as before.

## 2012-08-21 NOTE — Assessment & Plan Note (Addendum)
Acute bronchitis reflecting presumed viral illness caught from his grandson. He has legitimate concern that he does not want increased risk of infection when having joint replacement. Plan-he will finish current Z-Pak size fluids, rest and supportive care.

## 2012-08-28 ENCOUNTER — Other Ambulatory Visit: Payer: Self-pay | Admitting: Internal Medicine

## 2012-09-18 ENCOUNTER — Other Ambulatory Visit: Payer: Self-pay | Admitting: Internal Medicine

## 2012-11-20 ENCOUNTER — Ambulatory Visit: Payer: Medicare Other | Admitting: Internal Medicine

## 2012-12-01 ENCOUNTER — Other Ambulatory Visit: Payer: Self-pay | Admitting: Internal Medicine

## 2012-12-14 ENCOUNTER — Other Ambulatory Visit (INDEPENDENT_AMBULATORY_CARE_PROVIDER_SITE_OTHER): Payer: Medicare Other

## 2012-12-14 DIAGNOSIS — Z0000 Encounter for general adult medical examination without abnormal findings: Secondary | ICD-10-CM

## 2012-12-14 DIAGNOSIS — E119 Type 2 diabetes mellitus without complications: Secondary | ICD-10-CM

## 2012-12-14 LAB — HEPATIC FUNCTION PANEL
Albumin: 4.4 g/dL (ref 3.5–5.2)
Bilirubin, Direct: 0.1 mg/dL (ref 0.0–0.3)
Total Protein: 7.4 g/dL (ref 6.0–8.3)

## 2012-12-14 LAB — CBC WITH DIFFERENTIAL/PLATELET
Basophils Relative: 0.6 % (ref 0.0–3.0)
HCT: 42.2 % (ref 39.0–52.0)
Hemoglobin: 14.3 g/dL (ref 13.0–17.0)
Lymphocytes Relative: 26.7 % (ref 12.0–46.0)
Lymphs Abs: 1.8 10*3/uL (ref 0.7–4.0)
MCHC: 33.8 g/dL (ref 30.0–36.0)
Monocytes Relative: 6.9 % (ref 3.0–12.0)
Neutro Abs: 4.1 10*3/uL (ref 1.4–7.7)
RBC: 4.63 Mil/uL (ref 4.22–5.81)

## 2012-12-14 LAB — LIPID PANEL
HDL: 46.2 mg/dL (ref >39.00)
Total CHOL/HDL Ratio: 4
Triglycerides: 188 mg/dL — ABNORMAL HIGH (ref 0.0–149.0)
VLDL: 37.6 mg/dL (ref 0.0–40.0)

## 2012-12-14 LAB — URINALYSIS, ROUTINE W REFLEX MICROSCOPIC
Bilirubin Urine: NEGATIVE
Ketones, ur: NEGATIVE
RBC / HPF: NONE SEEN (ref 0–?)
Specific Gravity, Urine: >=1.03 (ref 1.000–1.030)
Total Protein, Urine: NEGATIVE
Urine Glucose: NEGATIVE
pH: 5.5 (ref 5.0–8.0)

## 2012-12-14 LAB — TSH: TSH: 2.77 u[IU]/mL (ref 0.35–5.50)

## 2012-12-14 LAB — PSA: PSA: 0.38 ng/mL (ref 0.10–4.00)

## 2012-12-14 LAB — BASIC METABOLIC PANEL
CO2: 27 mEq/L (ref 19–32)
Calcium: 9.8 mg/dL (ref 8.4–10.5)
Creatinine, Ser: 1.2 mg/dL (ref 0.4–1.5)
GFR: 63.68 mL/min (ref >60.00)
Sodium: 138 mEq/L (ref 135–145)

## 2012-12-14 LAB — MICROALBUMIN / CREATININE URINE RATIO: Microalb Creat Ratio: 1.3 mg/g (ref 0.0–30.0)

## 2012-12-18 ENCOUNTER — Encounter: Payer: Self-pay | Admitting: Internal Medicine

## 2012-12-18 ENCOUNTER — Ambulatory Visit (INDEPENDENT_AMBULATORY_CARE_PROVIDER_SITE_OTHER): Payer: Medicare Other | Admitting: Internal Medicine

## 2012-12-18 VITALS — BP 116/70 | HR 64 | Temp 98.4°F | Ht 71.0 in | Wt 261.2 lb

## 2012-12-18 DIAGNOSIS — E119 Type 2 diabetes mellitus without complications: Secondary | ICD-10-CM

## 2012-12-18 DIAGNOSIS — E785 Hyperlipidemia, unspecified: Secondary | ICD-10-CM

## 2012-12-18 DIAGNOSIS — E039 Hypothyroidism, unspecified: Secondary | ICD-10-CM

## 2012-12-18 DIAGNOSIS — Z0000 Encounter for general adult medical examination without abnormal findings: Secondary | ICD-10-CM

## 2012-12-18 DIAGNOSIS — I10 Essential (primary) hypertension: Secondary | ICD-10-CM

## 2012-12-18 NOTE — Assessment & Plan Note (Signed)

## 2012-12-18 NOTE — Patient Instructions (Signed)
Please continue all other medications as before, and refills have been done if requested. Please have the pharmacy call with any other refills you may need. Please continue your efforts at being more active, low cholesterol diet, and weight control. You are otherwise up to date with prevention measures today.  Please remember to sign up for My Chart if you have not done so, as this will be important to you in the future with finding out test results, communicating by private email, and scheduling acute appointments online when needed.  Please return in 6 months, or sooner if needed, with Lab testing done 3-5 days before

## 2012-12-18 NOTE — Assessment & Plan Note (Addendum)
a1c slightly elev since last vist with several lbs increased, o/w stable overall by history and exam, recent data reviewed with pt, and pt to continue medical treatment as before,  to f/u any worsening symptoms or concerns Lab Results  Component Value Date   HGBA1C 6.9* 12/14/2012

## 2012-12-18 NOTE — Assessment & Plan Note (Signed)
stable overall by history and exam, recent data reviewed with pt, and pt to continue medical treatment as before,  to f/u any worsening symptoms or concerns Lab Results  Component Value Date   TSH 2.77 12/14/2012

## 2012-12-18 NOTE — Assessment & Plan Note (Signed)
stable overall by history and exam, recent data reviewed with pt, and pt to continue medical treatment as before,  to f/u any worsening symptoms or concerns Lab Results  Component Value Date   LDLCALC 94 12/14/2012

## 2012-12-18 NOTE — Progress Notes (Signed)
Subjective:    Patient ID: Joseph Villa, male    DOB: 1941/02/05, 72 y.o.   MRN: [id number]  HPI  Here for yearly f/u;  Overall doing ok;  Pt denies CP, worsening SOB, DOE, wheezing, orthopnea, PND, worsening LE edema, palpitations, dizziness or syncope.  Pt denies neurological change such as new headache, facial or extremity weakness.  Pt denies polydipsia, polyuria, or low sugar symptoms. Pt states overall good compliance with treatment and medications, good tolerability, and has been trying to follow lower cholesterol diet.  Pt denies worsening depressive symptoms, suicidal ideation or panic. No fever, night sweats, wt loss, loss of appetite, or other constitutional symptoms.  Pt states good ability with ADL's, has low fall risk, home safety reviewed and adequate, no other significant changes in hearing or vision, and only occasionally active with exercise.  Admits to extra eggs recently but o/w trying to watch diet better, did have some toe swelling after pizza and salt recently but now resolved.  Has f/u with pulm next mo. Does still do elliptical and recumbant bike at home most days Denies hyper or hypo thyroid symptoms such as voice, skin or hair change.  Past Medical History  Diagnosis Date  . ALLERGIC ASTHMA 11/25/2007  . ALLERGIC RHINITIS 11/25/2007  . BIPOLAR DISORDER UNSPECIFIED 10/06/2009  . COLONIC POLYPS, HX OF 10/06/2009  . DM w/o Complication Type II 123456  . GERD 10/06/2009  . HYPERLIPIDEMIA 10/06/2009  . HYPERTENSION, BENIGN 07/27/2009  . HYPOTHYROIDISM 10/06/2009  . NEPHROLITHIASIS, HX OF 10/06/2009  . OBSTRUCTIVE SLEEP APNEA 11/25/2007    cpap setting of 9  . DJD (degenerative joint disease)   . Degenerative arthritis of hip 04/18/2011  . Arthritis, lumbar spine 04/18/2011  . Hemorrhoid   . Hemorrhage yrs ago    after goiter surgery, tracheostomy inserted and later removed   Past Surgical History  Procedure Laterality Date  . Ganglion cyst excision    . Goiter    .  Tonsillectomy    . Hernia repair  several yrs ago  . Total hip arthroplasty  06/16/2012    Procedure: TOTAL HIP ARTHROPLASTY ANTERIOR APPROACH;  Surgeon: Mauri Pole, MD;  Location: [facility];  Service: Orthopedics;  Laterality: Left;    reports that he quit smoking about 35 years ago. His smoking use included Cigarettes and Pipe. He has a 22.5 pack-year smoking history. He has never used smokeless tobacco. He reports that he does not drink alcohol or use illicit drugs. family history includes Alcohol abuse in his other and Cancer in his mother. Allergies  Allergen Reactions  . Cefuroxime Axetil Anaphylaxis    REACTION: anaphylaxis  . Cefprozil     REACTION: unknown  . Rabeprazole Sodium Nausea Only    REACTION: nausea   Current Outpatient Prescriptions on File Prior to Visit  Medication Sig Dispense Refill  . atorvastatin (LIPITOR) 10 MG tablet Take 10 mg by mouth every evening.       Marland Kitchen BENICAR 20 MG tablet TAKE ONE TABLET BY MOUTH ONCE DAILY  90 tablet  3  . carbamazepine (TEGRETOL) 200 MG tablet TAKE 2 TABLETS AT BEDTIME  180 tablet  1  . Cholecalciferol (VITAMIN D3) 2000 UNITS TABS Take 2,000 Units by mouth every morning.       . citalopram (CELEXA) 10 MG tablet Take 10 mg by mouth at bedtime.      . clobetasol cream (TEMOVATE) [id number] % Apply 1 application topically 2 (two) times daily as needed. For eczema.      Marland Kitchen  desonide (DESOWEN) 0.05 % lotion Apply 1 application topically daily as needed. For groin rash.      . fluticasone (FLONASE) 50 MCG/ACT nasal spray PLACE ONE SPRAY INTO NOSE DAILY  16 g  3  . glucosamine-chondroitin 500-400 MG tablet Take 1 tablet by mouth every morning.       Marland Kitchen glucose blood test strip Use as instructed 1  Per day  250.02      . hydrochlorothiazide (HYDRODIURIL) 25 MG tablet Take 25 mg by mouth every morning.      Marland Kitchen levothyroxine (SYNTHROID, LEVOTHROID) 100 MCG tablet Take 100 mcg by mouth at bedtime.      . meloxicam (MOBIC) 15 MG tablet TAKE ONE TABLET BY  MOUTH ONCE DAILY  90 tablet  2  . Multiple Vitamin (MULTIVITAMIN) tablet Take 1 tablet by mouth every morning.       . olmesartan (BENICAR) 20 MG tablet Take 20 mg by mouth at bedtime.      . pantoprazole (PROTONIX) 40 MG tablet Take 40 mg by mouth at bedtime.      . meclizine (ANTIVERT) 12.5 MG tablet Take 1 tablet (12.5 mg total) by mouth 3 (three) times daily as needed.  30 tablet  1  . PROCTOSOL HC 2.5 % rectal cream APPLY RECTALLY TWICE DAILY  28.35 g  2   No current facility-administered medications on file prior to visit.   Review of Systems Constitutional: Negative for diaphoresis, activity change, appetite change or unexpected weight change.  HENT: Negative for hearing loss, ear pain, facial swelling, mouth sores and neck stiffness.   Eyes: Negative for pain, redness and visual disturbance.  Respiratory: Negative for shortness of breath and wheezing.   Cardiovascular: Negative for chest pain and palpitations.  Gastrointestinal: Negative for diarrhea, blood in stool, abdominal distention or other pain Genitourinary: Negative for hematuria, flank pain or change in urine volume.  Musculoskeletal: Negative for myalgias and joint swelling.  Skin: Negative for color change and wound.  Neurological: Negative for syncope and numbness. other than noted Hematological: Negative for adenopathy.  Psychiatric/Behavioral: Negative for hallucinations, self-injury, decreased concentration and agitation.      Objective:   Physical Exam BP 116/70  Pulse 64  Temp(Src) 98.4 F (36.9 C) (Oral)  Ht 5\' 11"  (1.803 m)  Wt 261 lb 4 oz (118.502 kg)  BMI 36.45 kg/m2  SpO2 97% VS noted,  Constitutional: Pt is oriented to person, place, and time. Appears well-developed and well-nourished.  Head: Normocephalic and atraumatic.  Right Ear: External ear normal.  Left Ear: External ear normal.  Nose: Nose normal.  Mouth/Throat: Oropharynx is clear and moist.  Eyes: Conjunctivae and EOM are normal.  Pupils are equal, round, and reactive to light.  Neck: Normal range of motion. Neck supple. No JVD present. No tracheal deviation present.  Cardiovascular: Normal rate, regular rhythm, normal heart sounds and intact distal pulses.   Pulmonary/Chest: Effort normal and breath sounds normal.  Abdominal: Soft. Bowel sounds are normal. There is no tenderness. No HSM  Musculoskeletal: Normal range of motion. Exhibits no edema.  Lymphadenopathy:  Has no cervical adenopathy.  Neurological: Pt is alert and oriented to person, place, and time. Pt has normal reflexes. No cranial nerve deficit. Motor 5/5 but decreased slight left dorsalis pedis Skin: Skin is warm and dry. No rash noted.  Psychiatric:  Has  normal mood and affect. Behavior is normal. mild nervpius        Assessment & Plan:

## 2012-12-18 NOTE — Assessment & Plan Note (Signed)
stable overall by history and exam, recent data reviewed with pt, and pt to continue medical treatment as before,  to f/u any worsening symptoms or concerns BP Readings from Last 3 Encounters:  12/18/12 116/70  08/17/12 126/70  06/30/12 112/60   Note:  Total time for pt hx, exam, review of record with pt in the room, determination of diagnoses and plan for further eval and tx is > 40 min, with over 50% spent in coordination and counseling of patient

## 2012-12-21 ENCOUNTER — Telehealth: Payer: Self-pay | Admitting: *Deleted

## 2012-12-21 MED ORDER — METFORMIN HCL ER 500 MG PO TB24: 500.0000 mg | ORAL_TABLET | Freq: Every day | ORAL | Status: DC

## 2012-12-21 NOTE — Telephone Encounter (Signed)
Ok  For metformin ER 500 qd - done erx

## 2012-12-21 NOTE — Telephone Encounter (Signed)
Pt called states he is willing to start oral Diabetes medication.  Pt request medication Rx be sent to pharmacy.

## 2012-12-21 NOTE — Telephone Encounter (Signed)
Called the patient left detailed message of rx sent in to his pharmacy for diabetes.

## 2012-12-22 ENCOUNTER — Other Ambulatory Visit: Payer: Self-pay | Admitting: Internal Medicine

## 2012-12-29 ENCOUNTER — Telehealth: Payer: Self-pay | Admitting: *Deleted

## 2012-12-29 NOTE — Telephone Encounter (Signed)
Called left msg. To call back 

## 2012-12-29 NOTE — Telephone Encounter (Signed)
Patient informed. 

## 2012-12-29 NOTE — Telephone Encounter (Signed)
Nothing specific at this time; to make OV if develops any skin or other possible infectious symptoms or fever

## 2012-12-29 NOTE — Telephone Encounter (Signed)
Pt called states he has been exposed to MRSA by his grand son.  Requesting whether there is any action he needs to take or medication he needs to be prescribed.  Please advise.

## 2013-01-05 ENCOUNTER — Other Ambulatory Visit: Payer: Self-pay | Admitting: Internal Medicine

## 2013-01-06 ENCOUNTER — Other Ambulatory Visit: Payer: Self-pay

## 2013-01-14 ENCOUNTER — Ambulatory Visit (INDEPENDENT_AMBULATORY_CARE_PROVIDER_SITE_OTHER): Payer: Medicare Other | Admitting: Internal Medicine

## 2013-01-14 ENCOUNTER — Encounter: Payer: Self-pay | Admitting: Internal Medicine

## 2013-01-14 VITALS — BP 115/60 | HR 77 | Ht 70.5 in | Wt 265.0 lb

## 2013-01-14 DIAGNOSIS — G4733 Obstructive sleep apnea (adult) (pediatric): Secondary | ICD-10-CM

## 2013-01-14 NOTE — Patient Instructions (Addendum)
Order- DME [facility]- Change CPAP pressure to 10     Dx OSA  Please call as needed

## 2013-01-14 NOTE — Progress Notes (Signed)
04/23/11- 70 yoM former smoker, followed for allergic rhinitis, asthma, complicated by DM, Bipolar, GERD LOV-04/24/2010 Has had flu vaccine and TDAP He has done well over the past year, off of allergy vaccine Continues CPAP at 9 CWP for his sleep apnea/[facility]. Asthma causes no problems unless he is in a very dusty environment and even then it is mild. He is no longer using Advair. Rarely uses rescue inhaler. Does use Flonase for rhinitis.  11/15/11- 70 yoM former smoker, followed for allergic rhinitis, asthma, complicated by DM, Bipolar, GERD Sleep apnea control remains good at 9 CWP/ [facility]. Uses chin strap. Still gets dry mouth if lying on his back. Dyspnea with exertion is stable and chronic. No cough. 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 CXR 05/09/11- reviewed with him IMPRESSION:  1. Some interval increase in linear lingular scarring or  atelectasis.  Original Report Authenticated By: Trecia Rogers, M.D.    05/22/12- 71 yoM former smoker, followed for allergic rhinitis, asthma, complicated by DM, Bipolar, GERD FOLLOWS FOR: wears CPAP every night from 1130pm to 12am to 6-7am; still having episodes of waking up and feels like something is going to happen(anxiety) having more stress. Needs surgery clearance for hip surgery CPAP 9/[facility] with good compliance and control. He gets some dry mouth and we discussed the humidifier. He will take his own mask to the hospital her surgery and they will supply the machine. He had been going to a gym regularly 3 times a week for many years, recently limited by his hip pain. Little cough with no wheeze or phlegm. Still notices dyspnea on exertion, probably unchanged over 8-10 years. He says he is short of breath walking one block on level ground but he had been able to exercise hard at the gym. CXR 11/14/11-reviewed and compared with earlier images IMPRESSION:  Chronic left basilar  atelectasis versus scarring.  Mild chronic bronchitic and interstitial lung disease changes.  Original Report Authenticated By: Burnetta Sabin, M.D.  Office Spirometry 05/22/12-normal spirometry. FVC 3.85/83%, FEV1 2.88/81%, FEV1/FVC 0.75, FEF 25-75% 2.17/69%.  08/17/12- 71 yoM former smoker, followed for allergic rhinitis, asthma, complicated by DM, Bipolar, GERD ACUTE VISIT: cough-"gray colored mucus with green sinus" x 5 days; started zpak 3 days ago. Cough gray, nasal green. Eye itches,  But no fever, HA, malaise, blood.  Grandson visited, bringing a cold. Using Afrin- discussed. Had hip replacement and is concerned about potential joint infection if respiratory infection lingers.  Doing fine with CPAP 9/ [facility].  CXR 06/02/12-  IMPRESSION:  Stable exam. Basilar atelectasis versus scarring.  Original Report Authenticated By: Jerilynn Mages. Shick, M.D.  01/14/13- 74 yoM former smoker, followed for allergic rhinitis, asthma, complicated by DM, Bipolar, GERD FOLLOWS FOR: SOB when wokring out(unable to get deep breath). only able to work out 2 times a week now.  Doing fine with CPAP 9/ [facility], but says he would like to try 10. Feels very well in chest without cough or wheeze. Exercising less.  ROS-see HPI Constitutional:   No-   weight loss, night sweats, fevers, chills, fatigue, lassitude. HEENT:   No-  headaches, difficulty swallowing, tooth/dental problems, sore throat,       No-  Sneezing,+ itching, ear ache, +nasal congestion, +post nasal drip,  CV:  No-   chest pain, orthopnea, PND, swelling in lower extremities, anasarca, dizziness, palpitations Resp: No-shortness of breath with exertion or at rest.  No-productive cough,  No non-productive cough,  No- coughing up of blood.              No-change in color of mucus.  No- wheezing.   Skin: No-   rash or lesions. GI:  No-   heartburn, indigestion, abdominal pain, nausea, vomiting,  GU: . MS:  No-   joint pain or swelling.  Neuro-      nothing unusual Psych:  No- change in mood or affect. No depression or anxiety.  No memory loss.   OBJ- General- Alert, Oriented, Affect-appropriate, Distress- none acute, mild hoarseness. Heavy set/ muscular Skin- rash-none, lesions- none, excoriation- none Lymphadenopathy- none Head- atraumatic            Eyes- Gross vision intact, PERRLA, conjunctivae clear secretions            Ears- +Hearing aids            Nose- Clear, no-Septal dev, mucus, polyps, erosion, perforation             Throat- Mallampati IV , mucosa clear , drainage- none, tonsils present Neck- flexible , trachea midline, no stridor , thyroid nl, carotid no bruit Chest - symmetrical excursion , unlabored           Heart/CV- RRR , no murmur , no gallop  , no rub, nl s1 s2                           - JVD- none , edema- none, stasis changes- none, varices- none           Lung- clear to P&A, wheeze- none, cough-none , dullness-none, rub- none           Chest wall-  Abd-  Br/ Gen/ Rectal- Not done, not indicated Extrem- cyanosis- none, clubbing, none, atrophy- none, strength- nl Neuro- grossly intact to observation

## 2013-02-01 NOTE — Assessment & Plan Note (Signed)
Although he insists he is doing very well, he would like to try CPAP 10 so we will arrange that with [facility].

## 2013-02-19 ENCOUNTER — Other Ambulatory Visit: Payer: Self-pay | Admitting: Internal Medicine

## 2013-03-11 ENCOUNTER — Telehealth: Payer: Self-pay | Admitting: Internal Medicine

## 2013-03-11 MED ORDER — DOXYCYCLINE HYCLATE 100 MG PO TABS: ORAL_TABLET | ORAL | Status: DC

## 2013-03-11 MED ORDER — PREDNISONE 10 MG PO TABS: ORAL_TABLET | ORAL | Status: DC

## 2013-03-11 NOTE — Telephone Encounter (Signed)
Last OV 01-14-13. I spoke with the pt and he states he was at the beach x 1 week ago and slept in his boat without any heat and it got pretty cold and he feels this exposure may have caused his following symptoms. Pt states he is having wheezing x 1 week, and now a productive cough with green phlegm and temp of 99.6 x 1 day. Pt is unable to come in for an appt today. Please advise. Harlingen Bing, CMA Allergies  Allergen Reactions  . Cefuroxime Axetil Anaphylaxis    REACTION: anaphylaxis  . Cefprozil     REACTION: unknown  . Rabeprazole Sodium Nausea Only    REACTION: nausea

## 2013-03-11 NOTE — Telephone Encounter (Signed)
Pt notified of Dr Janee Morn recommendations.  Rx sent

## 2013-03-11 NOTE — Telephone Encounter (Signed)
Per CY-give patient Doxycycline 100 mg #8 take 2 today then 1 daily no refills and Prednisone 10 mg #20 take 4 x 2 days, 3 x 2 days, 2 x 2 days, 1 x 2 days, then stop no refills.

## 2013-03-15 ENCOUNTER — Other Ambulatory Visit: Payer: Self-pay | Admitting: Internal Medicine

## 2013-03-15 NOTE — Telephone Encounter (Signed)
Please advise if you are okay with patient having refill; I do not see in EPIC where patient has ever had. Thanks.

## 2013-03-15 NOTE — Telephone Encounter (Signed)
Ok for [place] to fill until next ov.

## 2013-04-05 ENCOUNTER — Ambulatory Visit (INDEPENDENT_AMBULATORY_CARE_PROVIDER_SITE_OTHER): Payer: Medicare Other

## 2013-04-05 DIAGNOSIS — Z23 Encounter for immunization: Secondary | ICD-10-CM

## 2013-05-24 ENCOUNTER — Other Ambulatory Visit: Payer: Self-pay | Admitting: Internal Medicine

## 2013-06-18 ENCOUNTER — Other Ambulatory Visit: Payer: Self-pay | Admitting: Internal Medicine

## 2013-07-07 ENCOUNTER — Other Ambulatory Visit (INDEPENDENT_AMBULATORY_CARE_PROVIDER_SITE_OTHER): Payer: Medicare Other

## 2013-07-07 DIAGNOSIS — E119 Type 2 diabetes mellitus without complications: Secondary | ICD-10-CM

## 2013-07-07 LAB — BASIC METABOLIC PANEL
BUN: 30 mg/dL — AB (ref 6–23)
CALCIUM: 9.2 mg/dL (ref 8.4–10.5)
CO2: 27 mEq/L (ref 19–32)
CREATININE: 1.2 mg/dL (ref 0.4–1.5)
Chloride: 104 mEq/L (ref 96–112)
GFR: 61.79 mL/min (ref >60.00)
Glucose, Bld: 160 mg/dL — ABNORMAL HIGH (ref 70–99)
Potassium: 4.2 mEq/L (ref 3.5–5.1)
Sodium: 140 mEq/L (ref 135–145)

## 2013-07-07 LAB — HEMOGLOBIN A1C: Hgb A1c MFr Bld: 7.6 % — ABNORMAL HIGH (ref 4.6–6.5)

## 2013-07-07 LAB — HEPATIC FUNCTION PANEL
ALT: 56 U/L — AB (ref 0–53)
AST: 41 U/L — ABNORMAL HIGH (ref 0–37)
Albumin: 4.1 g/dL (ref 3.5–5.2)
Alkaline Phosphatase: 63 U/L (ref 39–117)
BILIRUBIN TOTAL: 0.7 mg/dL (ref 0.3–1.2)
Bilirubin, Direct: 0.1 mg/dL (ref 0.0–0.3)
Total Protein: 7.2 g/dL (ref 6.0–8.3)

## 2013-07-07 LAB — LIPID PANEL
CHOLESTEROL: 155 mg/dL (ref 0–200)
HDL: 45.2 mg/dL (ref >39.00)
LDL CALC: 78 mg/dL (ref 0–99)
TRIGLYCERIDES: 158 mg/dL — AB (ref 0.0–149.0)
Total CHOL/HDL Ratio: 3
VLDL: 31.6 mg/dL (ref 0.0–40.0)

## 2013-07-09 ENCOUNTER — Other Ambulatory Visit: Payer: Self-pay | Admitting: Internal Medicine

## 2013-07-09 ENCOUNTER — Encounter: Payer: Self-pay | Admitting: Internal Medicine

## 2013-07-09 ENCOUNTER — Ambulatory Visit (INDEPENDENT_AMBULATORY_CARE_PROVIDER_SITE_OTHER): Payer: Medicare Other | Admitting: Internal Medicine

## 2013-07-09 ENCOUNTER — Ambulatory Visit (INDEPENDENT_AMBULATORY_CARE_PROVIDER_SITE_OTHER)
Admission: RE | Admit: 2013-07-09 | Discharge: 2013-07-09 | Disposition: A | Discharge status: Home / Self Care | Payer: Medicare Other | Source: Ambulatory Visit | Attending: Internal Medicine | Admitting: Internal Medicine

## 2013-07-09 ENCOUNTER — Telehealth: Payer: Self-pay | Admitting: Internal Medicine

## 2013-07-09 VITALS — BP 140/78 | HR 70 | Temp 97.1°F | Ht 71.0 in | Wt 264.8 lb

## 2013-07-09 DIAGNOSIS — R059 Cough, unspecified: Secondary | ICD-10-CM | POA: Insufficient documentation

## 2013-07-09 DIAGNOSIS — E1149 Type 2 diabetes mellitus with other diabetic neurological complication: Secondary | ICD-10-CM

## 2013-07-09 DIAGNOSIS — E1142 Type 2 diabetes mellitus with diabetic polyneuropathy: Secondary | ICD-10-CM

## 2013-07-09 DIAGNOSIS — I10 Essential (primary) hypertension: Secondary | ICD-10-CM

## 2013-07-09 DIAGNOSIS — R05 Cough: Secondary | ICD-10-CM

## 2013-07-09 DIAGNOSIS — E785 Hyperlipidemia, unspecified: Secondary | ICD-10-CM

## 2013-07-09 DIAGNOSIS — Z0000 Encounter for general adult medical examination without abnormal findings: Secondary | ICD-10-CM

## 2013-07-09 DIAGNOSIS — E1140 Type 2 diabetes mellitus with diabetic neuropathy, unspecified: Secondary | ICD-10-CM

## 2013-07-09 MED ORDER — METFORMIN HCL ER 500 MG PO TB24: ORAL_TABLET | ORAL | Status: DC

## 2013-07-09 NOTE — Assessment & Plan Note (Signed)
stable overall by history and exam, recent data reviewed with pt, and pt to continue medical treatment as before,  to f/u any worsening symptoms or concerns BP Readings from Last 3 Encounters:  07/09/13 140/78  01/14/13 115/60  12/18/12 116/70

## 2013-07-09 NOTE — Assessment & Plan Note (Signed)
Likely improved recent bronchitis with less prod cough, no wheezing, afeb, feels somewhat better today, wife already better; has hx of scarring to cxr , has seen Dr Charyl Dancer 2013; ok for cxr today - r/o pna , o/w cont same tx

## 2013-07-09 NOTE — Telephone Encounter (Signed)
Called and spoke with pts family and they are aware of pts appt with CY on 2-10 at 3.

## 2013-07-09 NOTE — Patient Instructions (Signed)
OK to increase your metformin to 2 pills per day Please continue all other medications as before, and refills have been done if requested. Please have the pharmacy call with any other refills you may need.  Please go to the XRAY Department in the Basement (go straight as you get off the elevator) for the x-ray testing You will be contacted by phone if any changes need to be made immediately.  Otherwise, you will receive a letter about your results with an explanation, but please check with MyChart first.  Please remember to sign up for MyChart if you have not done so, as this will be important to you in the future with finding out test results, communicating by private email, and scheduling acute appointments online when needed.  Please return in 6 months, or sooner if needed, with Lab testing done 3-5 days before

## 2013-07-09 NOTE — Assessment & Plan Note (Signed)
stable overall by history and exam, recent data reviewed with pt, and pt to continue medical treatment as before,  to f/u any worsening symptoms or concerns Lab Results  Component Value Date   LDLCALC 78 07/07/2013

## 2013-07-09 NOTE — Telephone Encounter (Signed)
Pt can be worked in on Tuesday 07-13-13 at 3pm. Thanks.

## 2013-07-09 NOTE — Progress Notes (Signed)
Subjective:    Patient ID: Joseph Villa, male    DOB: [birth year], 73 y.o.   MRN: [id number]  HPI  Here to f/u; overall doing ok,  Pt denies chest pain, increased sob or doe, wheezing, orthopnea, PND, increased LE swelling, palpitations, dizziness or syncope.  Pt denies polydipsia, polyuria, or low sugar symptoms such as weakness or confusion improved with po intake.  Pt denies new neurological symptoms such as new headache, or facial or extremity weakness or numbness.   Pt states overall good compliance with meds, has been trying to follow lower cholesterol, diabetic diet, with wt overall stable,  but little exercise however. Has known neuropathy.  Just back from a 2000 mile weeklong motorcycle trip. No longer taking his antidepressant, Denies worsening depressive symptoms, suicidal ideation, or panic.  CBG's have been 140-160 recently, admits occasional dietary indiscretion.  Past Medical History  Diagnosis Date  . ALLERGIC ASTHMA 11/25/2007  . ALLERGIC RHINITIS 11/25/2007  . BIPOLAR DISORDER UNSPECIFIED 10/06/2009  . COLONIC POLYPS, HX OF 10/06/2009  . DM w/o Complication Type II 65/12/9036  . GERD 10/06/2009  . HYPERLIPIDEMIA 10/06/2009  . HYPERTENSION, BENIGN 07/27/2009  . HYPOTHYROIDISM 10/06/2009  . NEPHROLITHIASIS, HX OF 10/06/2009  . OBSTRUCTIVE SLEEP APNEA 11/25/2007    cpap setting of 9  . DJD (degenerative joint disease)   . Degenerative arthritis of hip 04/18/2011  . Arthritis, lumbar spine 04/18/2011  . Hemorrhoid   . Hemorrhage yrs ago    after goiter surgery, tracheostomy inserted and later removed   Past Surgical History  Procedure Laterality Date  . Ganglion cyst excision    . Goiter    . Tonsillectomy    . Hernia repair  several yrs ago  . Total hip arthroplasty  06/16/2012    Procedure: TOTAL HIP ARTHROPLASTY ANTERIOR APPROACH;  Surgeon: Mauri Pole, MD;  Location: [facility];  Service: Orthopedics;  Laterality: Left;    reports that he quit smoking about 36 years ago. His  smoking use included Cigarettes and Pipe. He has a 22.5 pack-year smoking history. He has never used smokeless tobacco. He reports that he does not drink alcohol or use illicit drugs. family history includes Alcohol abuse in his other; Cancer in his mother. Allergies  Allergen Reactions  . Cefuroxime Axetil Anaphylaxis    REACTION: anaphylaxis  . Cefprozil     REACTION: unknown  . Rabeprazole Sodium Nausea Only    REACTION: nausea   Current Outpatient Prescriptions on File Prior to Visit  Medication Sig Dispense Refill  . ACCU-CHEK AVIVA PLUS test strip AS DIRECTED ONCE A DAY  100 each  11  . ACCU-CHEK FASTCLIX LANCETS MISC TWICE DAILY  102 each  11  . atorvastatin (LIPITOR) 10 MG tablet Take 10 mg by mouth every evening.       Marland Kitchen BENICAR 20 MG tablet TAKE ONE TABLET BY MOUTH ONCE DAILY  90 tablet  3  . Blood Glucose Monitoring Suppl (ACCU-CHEK AVIVA PLUS) W/DEVICE KIT AS DIRECTED  1 kit  0  . carbamazepine (TEGRETOL) 200 MG tablet TAKE 2 TABLETS AT BEDTIME  180 tablet  3  . Cholecalciferol (VITAMIN D3) 2000 UNITS TABS Take 2,000 Units by mouth every morning.       . clobetasol cream (TEMOVATE) 3.33 % Apply 1 application topically 2 (two) times daily as needed. For eczema.      Marland Kitchen desonide (DESOWEN) 0.05 % lotion Apply 1 application topically daily as needed. For groin rash.      Marland Kitchen  fluticasone (FLONASE) 50 MCG/ACT nasal spray PLACE 1 SPRAY INTO NOSE DAILY  16 g  11  . glucosamine-chondroitin 500-400 MG tablet Take 1 tablet by mouth every morning.       Marland Kitchen glucose blood test strip Use as instructed 1  Per day  250.02      . hydrochlorothiazide (HYDRODIURIL) 25 MG tablet Take 25 mg by mouth every morning.      Marland Kitchen levothyroxine (SYNTHROID, LEVOTHROID) 100 MCG tablet Take 100 mcg by mouth at bedtime.      . meloxicam (MOBIC) 15 MG tablet TAKE ONE TABLET EACH DAY  90 tablet  1  . metFORMIN (GLUCOPHAGE-XR) 500 MG 24 hr tablet Take 1 tablet (500 mg total) by mouth daily with breakfast.  90 tablet  3   . Multiple Vitamin (MULTIVITAMIN) tablet Take 1 tablet by mouth every morning.       . pantoprazole (PROTONIX) 40 MG tablet Take 40 mg by mouth at bedtime.      Marland Kitchen PROAIR HFA 108 (90 BASE) MCG/ACT inhaler TWO PUFFS FOUR TIMES DAILY AS NEEDED  8.5 g  11  . PROCTOSOL HC 2.5 % rectal cream APPLY RECTALLY TWICE DAILY  28.35 g  2   No current facility-administered medications on file prior to visit.    Review of Systems  Constitutional: Negative for unexpected weight change, or unusual diaphoresis  HENT: Negative for tinnitus.   Eyes: Negative for photophobia and visual disturbance.  Respiratory: Negative for choking and stridor.   Gastrointestinal: Negative for vomiting and blood in stool.  Genitourinary: Negative for hematuria and decreased urine volume.  Musculoskeletal: Negative for acute joint swelling Skin: Negative for color change and wound.  Neurological: Negative for tremors and numbness other than noted  Psychiatric/Behavioral: Negative for decreased concentration or  hyperactivity.       Objective:   Physical Exam BP 140/78  Pulse 70  Temp(Src) 97.1 F (36.2 C) (Oral)  Ht '5\' 11"'  (1.803 m)  Wt 264 lb 12 oz (120.09 kg)  BMI 36.94 kg/m2  SpO2 97% VS noted,  Constitutional: Pt appears well-developed and well-nourished.  HENT: Head: NCAT.  Right Ear: External ear normal.  Left Ear: External ear normal.  Eyes: Conjunctivae and EOM are normal. Pupils are equal, round, and reactive to light.  Neck: Normal range of motion. Neck supple.  Cardiovascular: Normal rate and regular rhythm.   Pulmonary/Chest: Effort normal and breath sounds with occas crackles left > right.  Abd:  Soft, NT, non-distended, + BS Neurological: Pt is alert. Not confused  Skin: Skin is warm. No erythema.  Psychiatric: Pt behavior is normal. Thought content normal.     Assessment & Plan:

## 2013-07-09 NOTE — Progress Notes (Signed)
Pre-visit discussion using our clinic review tool. No additional management support is needed unless otherwise documented below in the visit note.  

## 2013-07-09 NOTE — Assessment & Plan Note (Signed)
Mild uncontrolled, to incr the metformin er 500 to 2 per day, needs better diet, wt loss, exercise, f/u labs next visit Lab Results  Component Value Date   HGBA1C 7.6* 07/07/2013

## 2013-07-13 ENCOUNTER — Ambulatory Visit (INDEPENDENT_AMBULATORY_CARE_PROVIDER_SITE_OTHER): Payer: Medicare Other | Admitting: Internal Medicine

## 2013-07-13 ENCOUNTER — Encounter: Payer: Self-pay | Admitting: Internal Medicine

## 2013-07-13 VITALS — BP 124/66 | HR 68 | Ht 70.5 in | Wt 266.8 lb

## 2013-07-13 DIAGNOSIS — J209 Acute bronchitis, unspecified: Secondary | ICD-10-CM

## 2013-07-13 DIAGNOSIS — G4733 Obstructive sleep apnea (adult) (pediatric): Secondary | ICD-10-CM

## 2013-07-13 MED ORDER — AZITHROMYCIN 250 MG PO TABS: ORAL_TABLET | ORAL | Status: DC

## 2013-07-13 NOTE — Patient Instructions (Addendum)
Script for Zpak to hold  We can continue CPAP 10

## 2013-07-13 NOTE — Progress Notes (Signed)
04/23/11- 70 yoM former smoker, followed for allergic rhinitis, asthma, complicated by DM, Bipolar, GERD LOV-04/24/2010 Has had flu vaccine and TDAP He has done well over the past year, off of allergy vaccine Continues CPAP at 9 CWP for his sleep apnea/[facility]. Asthma causes no problems unless he is in a very dusty environment and even then it is mild. He is no longer using Advair. Rarely uses rescue inhaler. Does use Flonase for rhinitis.  11/15/11- 70 yoM former smoker, followed for allergic rhinitis, asthma, complicated by DM, Bipolar, GERD Sleep apnea control remains good at 9 CWP/ [facility]. Uses chin strap. Still gets dry mouth if lying on his back. Dyspnea with exertion is stable and chronic. No cough. 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 CXR 05/09/11- reviewed with him IMPRESSION:  1. Some interval increase in linear lingular scarring or  atelectasis.  Original Report Authenticated By: Trecia Rogers, M.D.    05/22/12- 71 yoM former smoker, followed for allergic rhinitis, asthma, complicated by DM, Bipolar, GERD FOLLOWS FOR: wears CPAP every night from 1130pm to 12am to 6-7am; still having episodes of waking up and feels like something is going to happen(anxiety) having more stress. Needs surgery clearance for hip surgery CPAP 9/[facility] with good compliance and control. He gets some dry mouth and we discussed the humidifier. He will take his own mask to the hospital her surgery and they will supply the machine. He had been going to a gym regularly 3 times a week for many years, recently limited by his hip pain. Little cough with no wheeze or phlegm. Still notices dyspnea on exertion, probably unchanged over 8-10 years. He says he is short of breath walking one block on level ground but he had been able to exercise hard at the gym. CXR 11/14/11-reviewed and compared with earlier images IMPRESSION:  Chronic left basilar  atelectasis versus scarring.  Mild chronic bronchitic and interstitial lung disease changes.  Original Report Authenticated By: Burnetta Sabin, M.D.  Office Spirometry 05/22/12-normal spirometry. FVC 3.85/83%, FEV1 2.88/81%, FEV1/FVC 0.75, FEF 25-75% 2.17/69%.  08/17/12- 71 yoM former smoker, followed for allergic rhinitis, asthma, complicated by DM, Bipolar, GERD ACUTE VISIT: cough-"gray colored mucus with green sinus" x 5 days; started zpak 3 days ago. Cough gray, nasal green. Eye itches,  But no fever, HA, malaise, blood.  Grandson visited, bringing a cold. Using Afrin- discussed. Had hip replacement and is concerned about potential joint infection if respiratory infection lingers.  Doing fine with CPAP 9/ [facility].  CXR 06/02/12-  IMPRESSION:  Stable exam. Basilar atelectasis versus scarring.  Original Report Authenticated By: Jerilynn Mages. Shick, M.D.  01/14/13- 57 yoM former smoker, followed for allergic rhinitis, asthma, complicated by DM, Bipolar, GERD FOLLOWS FOR: SOB when wokring out(unable to get deep breath). only able to work out 2 times a week now.  Doing fine with CPAP 9/ [facility], but says he would like to try 10. Feels very well in chest without cough or wheeze. Exercising less.  07/13/13- 88 yoM former smoker, followed for allergic rhinitis, asthma, complicated by DM, Bipolar, GERD ACUTE VISIT: Wife was sick around [place] got it from her. Stayed sick all of January as well; had a period of 5 days that his lungs "ached".  Was seen by PCP Dr Jenny Reichmann Friday 07-09-13; was told PCP felt like he could still have PNA. He denies fever or discolored sputum but asks Z pak to hold. CPAP 10/ [facility]- doing  well and pressure ok.  CXR 07/09/13 IMPRESSION:  No active cardiopulmonary disease.  Electronically Signed  By: Logan Bores  On: 07/09/2013 12:07  ROS-see HPI Constitutional:   No-   weight loss, night sweats, fevers, chills, fatigue, lassitude. HEENT:   No-  headaches, difficulty swallowing,  tooth/dental problems, sore throat,       No-  Sneezing,no- itching, ear ache, +nasal congestion, +post nasal drip,  CV:  No-   chest pain, orthopnea, PND, swelling in lower extremities, anasarca, dizziness, palpitations Resp: No-shortness of breath with exertion or at rest.              No-productive cough,  No non-productive cough,  No- coughing up of blood.              No-change in color of mucus.  No- wheezing.   Skin: No-   rash or lesions. GI:  No-   heartburn, indigestion, abdominal pain, nausea, vomiting,  GU: . MS:  No-   joint pain or swelling.  Neuro-     nothing unusual Psych:  No- change in mood or affect. No depression or anxiety.  No memory loss.   OBJ- General- Alert, Oriented, Affect-appropriate, Distress- none acute, mild hoarseness. Heavy           set/ muscular Skin- rash-none, lesions- none, excoriation- none Lymphadenopathy- none Head- atraumatic            Eyes- Gross vision intact, PERRLA, conjunctivae clear secretions            Ears- +Hearing aids/ HOH            Nose- Clear, no-Septal dev, mucus, polyps, erosion, perforation             Throat- Mallampati IV , mucosa clear , drainage- none, tonsils present Neck- flexible , trachea midline, no stridor , thyroid nl, carotid no bruit Chest - symmetrical excursion , unlabored           Heart/CV- RRR , no murmur , no gallop  , no rub, nl s1 s2                           - JVD- none , edema- none, stasis changes- none, varices- none           Lung- clear to P&A, wheeze- none, cough-none , dullness-none, rub- none           Chest wall-  Abd-  Br/ Gen/ Rectal- Not done, not indicated Extrem- cyanosis- none, clubbing, none, atrophy- none, strength- nl Neuro- grossly intact to observation

## 2013-07-31 ENCOUNTER — Other Ambulatory Visit: Payer: Self-pay | Admitting: Internal Medicine

## 2013-08-04 NOTE — Assessment & Plan Note (Signed)
Recurrent acute bronchitis, currently in remission. This is within range of expected for winter viral patterns.  Plan - Zpak to hold. Discussed avoidance of colds.

## 2013-08-04 NOTE — Assessment & Plan Note (Signed)
Good compliance and control 

## 2013-08-21 ENCOUNTER — Other Ambulatory Visit: Payer: Self-pay | Admitting: Internal Medicine

## 2013-08-23 NOTE — Telephone Encounter (Signed)
Done erx 

## 2013-08-23 NOTE — Telephone Encounter (Signed)
Pt is requesting atorvastatin which appears to be discontinued. Do you want to fill?

## 2013-10-11 ENCOUNTER — Ambulatory Visit (INDEPENDENT_AMBULATORY_CARE_PROVIDER_SITE_OTHER): Payer: Medicare Other

## 2013-10-11 ENCOUNTER — Telehealth: Payer: Self-pay | Admitting: Internal Medicine

## 2013-10-11 VITALS — BP 138/68

## 2013-10-11 DIAGNOSIS — I10 Essential (primary) hypertension: Secondary | ICD-10-CM

## 2013-10-11 NOTE — Telephone Encounter (Signed)
lmomtcb x1 for pt 

## 2013-10-11 NOTE — Telephone Encounter (Signed)
Pt has now left.

## 2013-10-11 NOTE — Telephone Encounter (Signed)
If no answer at home # please call cell # - [phone].  Or wife's cell # [phone].

## 2013-10-12 ENCOUNTER — Other Ambulatory Visit: Payer: Self-pay | Admitting: Internal Medicine

## 2013-10-12 MED ORDER — FLUTICASONE FUROATE-VILANTEROL 100-25 MCG/INH IN AEPB: 1.0000 | INHALATION_SPRAY | Freq: Every day | RESPIRATORY_TRACT | Status: DC

## 2013-10-12 NOTE — Telephone Encounter (Signed)
Unless he is having fever/ green stuff, feels infected, this sounds like seasonal asthma flare from pollen, so I don't expect antibiotic to help. Suggest he come pick up sample Breo with instruction, 1 puff and rinse mouth once daily.  He can have Prevnar-13

## 2013-10-12 NOTE — Telephone Encounter (Signed)
Pt c/o slight increase in asthma symptoms.  Wheezing at night, SOB about the same.  Denies cough.  Pt concerned because he has had pneumonia in the past.  Also wants to know if he needs a Pneumonia booster vaccine.  Please advise.  Allergies  Allergen Reactions  . Cefuroxime Axetil Anaphylaxis    REACTION: anaphylaxis  . Cefprozil     REACTION: unknown  . Rabeprazole Sodium Nausea Only    REACTION: nausea    Current Outpatient Prescriptions on File Prior to Visit  Medication Sig Dispense Refill  . ACCU-CHEK AVIVA PLUS test strip AS DIRECTED ONCE A DAY  100 each  11  . ACCU-CHEK FASTCLIX LANCETS MISC TWICE DAILY  102 each  11  . atorvastatin (LIPITOR) 10 MG tablet Take 10 mg by mouth every evening.       Marland Kitchen atorvastatin (LIPITOR) 10 MG tablet TAKE ONE TABLET BY MOUTH ONCE DAILY  90 tablet  3  . azithromycin (ZITHROMAX) 250 MG tablet 2 today then one daily  6 each  0  . BENICAR 20 MG tablet TAKE ONE TABLET BY MOUTH ONCE DAILY  90 tablet  3  . Blood Glucose Monitoring Suppl (ACCU-CHEK AVIVA PLUS) W/DEVICE KIT AS DIRECTED  1 kit  0  . carbamazepine (TEGRETOL) 200 MG tablet TAKE 2 TABLETS AT BEDTIME  180 tablet  3  . Cholecalciferol (VITAMIN D3) 2000 UNITS TABS Take 2,000 Units by mouth every morning.       . clobetasol cream (TEMOVATE) 1.82 % Apply 1 application topically 2 (two) times daily as needed. For eczema.      Marland Kitchen desonide (DESOWEN) 0.05 % lotion Apply 1 application topically daily as needed. For groin rash.      . fluticasone (FLONASE) 50 MCG/ACT nasal spray PLACE 1 SPRAY INTO NOSE DAILY  16 g  11  . glucosamine-chondroitin 500-400 MG tablet Take 1 tablet by mouth every morning.       Marland Kitchen glucose blood test strip Use as instructed 1  Per day  250.02      . hydrochlorothiazide (HYDRODIURIL) 25 MG tablet Take 25 mg by mouth every morning.      . hydrochlorothiazide (HYDRODIURIL) 25 MG tablet TAKE ONE TABLET BY MOUTH ONCE DAILY  90 tablet  3  . levothyroxine (SYNTHROID, LEVOTHROID) 100  MCG tablet Take 100 mcg by mouth at bedtime.      Marland Kitchen levothyroxine (SYNTHROID, LEVOTHROID) 100 MCG tablet TAKE ONE TABLET BY MOUTH ONCE DAILY  90 tablet  3  . meloxicam (MOBIC) 15 MG tablet TAKE ONE TABLET EACH DAY  90 tablet  1  . metFORMIN (GLUCOPHAGE-XR) 500 MG 24 hr tablet 2 tabs by mouth per day  180 tablet  3  . Multiple Vitamin (MULTIVITAMIN) tablet Take 1 tablet by mouth every morning.       . pantoprazole (PROTONIX) 40 MG tablet TAKE ONE TABLET BY MOUTH ONCE DAILY  90 tablet  3  . PROAIR HFA 108 (90 BASE) MCG/ACT inhaler TWO PUFFS FOUR TIMES DAILY AS NEEDED  8.5 g  11  . PROCTOSOL HC 2.5 % rectal cream APPLY RECTALLY TWICE DAILY  28.35 g  2   No current facility-administered medications on file prior to visit.

## 2013-10-12 NOTE — Telephone Encounter (Signed)
LM on home and  Pt cell. Called spouse on her cell and she states she will have the pt call us back. Luther Bing, CMA

## 2013-10-12 NOTE — Telephone Encounter (Signed)
Called spoke with pt. Aware of recs. He will pick up sample tomorrow and breo. Also placed on injection schedule for prevnar. Nothing further needed

## 2013-10-13 ENCOUNTER — Ambulatory Visit (INDEPENDENT_AMBULATORY_CARE_PROVIDER_SITE_OTHER): Payer: Medicare Other

## 2013-10-13 DIAGNOSIS — R05 Cough: Secondary | ICD-10-CM

## 2013-10-13 DIAGNOSIS — R059 Cough, unspecified: Secondary | ICD-10-CM

## 2013-10-13 DIAGNOSIS — Z2911 Encounter for prophylactic immunotherapy for respiratory syncytial virus (RSV): Secondary | ICD-10-CM

## 2013-10-13 DIAGNOSIS — Z23 Encounter for immunization: Secondary | ICD-10-CM

## 2013-10-13 DIAGNOSIS — R0602 Shortness of breath: Secondary | ICD-10-CM

## 2013-11-11 ENCOUNTER — Telehealth: Payer: Self-pay | Admitting: Internal Medicine

## 2013-11-11 MED ORDER — METFORMIN HCL ER 500 MG PO TB24: ORAL_TABLET | ORAL | Status: DC

## 2013-11-11 NOTE — Telephone Encounter (Signed)
Ok to increase to total 1500 qd - I will send new rx

## 2013-11-11 NOTE — Telephone Encounter (Signed)
Pt called request to increase metformin to 1500 mg due to blood glucose is running about 144,159 and 164. Please advise. Pt stated he was on 500 mg and then Dr. Jenny Reichmann increase it to 1000 mg last ov.

## 2013-11-11 NOTE — Telephone Encounter (Signed)
Called left detailed message of MD instructions of increase. Informed his wife.

## 2013-12-13 ENCOUNTER — Other Ambulatory Visit: Payer: Self-pay | Admitting: Internal Medicine

## 2013-12-23 ENCOUNTER — Telehealth: Payer: Self-pay | Admitting: Internal Medicine

## 2013-12-23 DIAGNOSIS — Z0000 Encounter for general adult medical examination without abnormal findings: Secondary | ICD-10-CM

## 2013-12-23 NOTE — Telephone Encounter (Signed)
Pt has labs already ordered; ok to do now  Also has appt already aug 7, but could be seen sooner if we have an appt available

## 2013-12-23 NOTE — Telephone Encounter (Signed)
Notified patient of lab orders

## 2013-12-23 NOTE — Telephone Encounter (Signed)
Patient has been having dizzy spells.  He does not want to get in with another provider and he does not want to wait to get in with Dr. Jenny Reichmann.  He would like to know if Dr. Jenny Reichmann would like to do labs?

## 2013-12-24 ENCOUNTER — Other Ambulatory Visit (INDEPENDENT_AMBULATORY_CARE_PROVIDER_SITE_OTHER): Payer: Medicare Other

## 2013-12-24 DIAGNOSIS — Z0000 Encounter for general adult medical examination without abnormal findings: Secondary | ICD-10-CM

## 2013-12-24 DIAGNOSIS — E1142 Type 2 diabetes mellitus with diabetic polyneuropathy: Secondary | ICD-10-CM

## 2013-12-24 DIAGNOSIS — E1140 Type 2 diabetes mellitus with diabetic neuropathy, unspecified: Secondary | ICD-10-CM

## 2013-12-24 DIAGNOSIS — Z125 Encounter for screening for malignant neoplasm of prostate: Secondary | ICD-10-CM

## 2013-12-24 DIAGNOSIS — E1149 Type 2 diabetes mellitus with other diabetic neurological complication: Secondary | ICD-10-CM

## 2013-12-24 LAB — CBC WITH DIFFERENTIAL/PLATELET
Basophils Absolute: 0 10*3/uL (ref 0.0–0.1)
Basophils Relative: 0.5 % (ref 0.0–3.0)
Eosinophils Absolute: 0.3 10*3/uL (ref 0.0–0.7)
Eosinophils Relative: 4.2 % (ref 0.0–5.0)
HEMATOCRIT: 41.1 % (ref 39.0–52.0)
Hemoglobin: 13.9 g/dL (ref 13.0–17.0)
LYMPHS ABS: 1.6 10*3/uL (ref 0.7–4.0)
Lymphocytes Relative: 24.4 % (ref 12.0–46.0)
MCHC: 33.8 g/dL (ref 30.0–36.0)
MCV: 91.4 fl (ref 78.0–100.0)
Monocytes Absolute: 0.4 10*3/uL (ref 0.1–1.0)
Monocytes Relative: 6.5 % (ref 3.0–12.0)
Neutro Abs: 4.2 10*3/uL (ref 1.4–7.7)
Neutrophils Relative %: 64.4 % (ref 43.0–77.0)
Platelets: 200 10*3/uL (ref 150.0–400.0)
RBC: 4.5 Mil/uL (ref 4.22–5.81)
RDW: 13.1 % (ref 11.5–15.5)
WBC: 6.5 10*3/uL (ref 4.0–10.5)

## 2013-12-24 LAB — BASIC METABOLIC PANEL
BUN: 25 mg/dL — AB (ref 6–23)
CO2: 23 mEq/L (ref 19–32)
Calcium: 9.5 mg/dL (ref 8.4–10.5)
Chloride: 104 mEq/L (ref 96–112)
Creatinine, Ser: 1.1 mg/dL (ref 0.4–1.5)
GFR: 68.75 mL/min (ref >60.00)
Glucose, Bld: 145 mg/dL — ABNORMAL HIGH (ref 70–99)
Potassium: 4.4 mEq/L (ref 3.5–5.1)
SODIUM: 137 meq/L (ref 135–145)

## 2013-12-24 LAB — HEPATIC FUNCTION PANEL
ALT: 49 U/L (ref 0–53)
AST: 38 U/L — AB (ref 0–37)
Albumin: 4.1 g/dL (ref 3.5–5.2)
Alkaline Phosphatase: 61 U/L (ref 39–117)
BILIRUBIN TOTAL: 0.6 mg/dL (ref 0.2–1.2)
Bilirubin, Direct: 0.1 mg/dL (ref 0.0–0.3)
Total Protein: 6.9 g/dL (ref 6.0–8.3)

## 2013-12-24 LAB — URINALYSIS, ROUTINE W REFLEX MICROSCOPIC
Bilirubin Urine: NEGATIVE
Hgb urine dipstick: NEGATIVE
KETONES UR: NEGATIVE
Leukocytes, UA: NEGATIVE
Nitrite: NEGATIVE
PH: 5.5 (ref 5.0–8.0)
Specific Gravity, Urine: 1.02 (ref 1.000–1.030)
Total Protein, Urine: NEGATIVE
Urine Glucose: NEGATIVE
Urobilinogen, UA: 0.2 (ref 0.0–1.0)

## 2013-12-24 LAB — MICROALBUMIN / CREATININE URINE RATIO
CREATININE, U: 170.2 mg/dL
Microalb Creat Ratio: 3.1 mg/g (ref 0.0–30.0)
Microalb, Ur: 5.3 mg/dL — ABNORMAL HIGH (ref 0.0–1.9)

## 2013-12-24 LAB — LIPID PANEL
Cholesterol: 161 mg/dL (ref 0–200)
HDL: 47.4 mg/dL (ref >39.00)
LDL CALC: 87 mg/dL (ref 0–99)
NonHDL: 113.6
TRIGLYCERIDES: 133 mg/dL (ref 0.0–149.0)
Total CHOL/HDL Ratio: 3
VLDL: 26.6 mg/dL (ref 0.0–40.0)

## 2013-12-24 LAB — HEMOGLOBIN A1C: Hgb A1c MFr Bld: 7 % — ABNORMAL HIGH (ref 4.6–6.5)

## 2013-12-24 LAB — TSH: TSH: 3.21 u[IU]/mL (ref 0.35–4.50)

## 2013-12-24 LAB — PSA: PSA: 0.3 ng/mL (ref 0.10–4.00)

## 2014-01-07 ENCOUNTER — Encounter: Payer: Self-pay | Admitting: Internal Medicine

## 2014-01-07 ENCOUNTER — Ambulatory Visit (INDEPENDENT_AMBULATORY_CARE_PROVIDER_SITE_OTHER): Payer: Medicare Other | Admitting: Internal Medicine

## 2014-01-07 VITALS — BP 120/72 | HR 68 | Temp 98.4°F | Ht 71.0 in | Wt 263.0 lb

## 2014-01-07 DIAGNOSIS — E1142 Type 2 diabetes mellitus with diabetic polyneuropathy: Secondary | ICD-10-CM

## 2014-01-07 DIAGNOSIS — E1340 Other specified diabetes mellitus with diabetic neuropathy, unspecified: Secondary | ICD-10-CM

## 2014-01-07 DIAGNOSIS — E1149 Type 2 diabetes mellitus with other diabetic neurological complication: Secondary | ICD-10-CM

## 2014-01-07 DIAGNOSIS — Z0000 Encounter for general adult medical examination without abnormal findings: Secondary | ICD-10-CM

## 2014-01-07 NOTE — Progress Notes (Signed)
Pre visit review using our clinic review tool, if applicable. No additional management support is needed unless otherwise documented below in the visit note. 

## 2014-01-07 NOTE — Assessment & Plan Note (Signed)
stable overall by history and exam, recent data reviewed with pt, and pt to continue medical treatment as before,  to f/u any worsening symptoms or concerns Lab Results  Component Value Date   HGBA1C 7.0* 12/24/2013

## 2014-01-07 NOTE — Assessment & Plan Note (Signed)

## 2014-01-07 NOTE — Patient Instructions (Addendum)
Please continue all other medications as before, and refills have been done if requested.  Please have the pharmacy call with any other refills you may need.  Please continue your efforts at being more active, low cholesterol diet, and weight control.  You are otherwise up to date with prevention measures today.  Please keep your appointments with your specialists as you may have planned  Your blood work was OK  Please return in 6 months, or sooner if needed, with Lab testing done 3-5 days before

## 2014-01-07 NOTE — Progress Notes (Signed)
Subjective:    Patient ID: Joseph Villa, male    DOB: [birth year], 73 y.o.   MRN: [id number]  HPI  Here for wellness and f/u;  Overall doing ok;  Pt denies CP, worsening SOB, DOE, wheezing, orthopnea, PND, worsening LE edema, palpitations, dizziness or syncope.  Pt denies neurological change such as new headache, facial or extremity weakness.  Pt denies polydipsia, polyuria, or low sugar symptoms. Pt states overall good compliance with treatment and medications, good tolerability, and has been trying to follow lower cholesterol diet.  Pt denies worsening depressive symptoms, suicidal ideation or panic. No fever, night sweats, wt loss, loss of appetite, or other constitutional symptoms.  Pt states good ability with ADL's, has low fall risk, home safety reviewed and adequate, no other significant changes in hearing or vision, and only occasionally active with exercise.  Did have some dizzy spells a few mo ago, with first getting up from a chair once, and getting up to BR at night once.   Mentions sleeping a lot in the past yr.  Has a cockatoo at home, spends much time each evening, both sleep together in the chair often.  He is allergic to bird with nasal congestion each time, but cant get rid of the bird.  Flonase helps. Not really worse in the past yr. Did feel much better for a short time with trip to [place] away from home/bird.  Asthma seems worse since July 10, with more little phlegm, slight cough; had 2 spells this summer not clear etiology to him. First  With sitting in car , seemed to feel like he stopped breathing while trying to put on a seat belt in the car, no nervous, cough, sob; feels like he might have some type of central apnea, only lsated a minute..  Later one night had a dream he was suffocating one night, woke up with a cough, wearing CPAP, none since.  Father has abnormal breathing/apnea spells while awake, pt feels he likely died with this.  Has known lumbar disc dz, right rot  cuff dz, and recently left hsoudler pain, but none in the past wk. Past Medical History  Diagnosis Date  . ALLERGIC ASTHMA 11/25/2007  . ALLERGIC RHINITIS 11/25/2007  . BIPOLAR DISORDER UNSPECIFIED 10/06/2009  . COLONIC POLYPS, HX OF 10/06/2009  . DM w/o Complication Type II 52/12/7822  . GERD 10/06/2009  . HYPERLIPIDEMIA 10/06/2009  . HYPERTENSION, BENIGN 07/27/2009  . HYPOTHYROIDISM 10/06/2009  . NEPHROLITHIASIS, HX OF 10/06/2009  . OBSTRUCTIVE SLEEP APNEA 11/25/2007    cpap setting of 9  . DJD (degenerative joint disease)   . Degenerative arthritis of hip 04/18/2011  . Arthritis, lumbar spine 04/18/2011  . Hemorrhoid   . Hemorrhage yrs ago    after goiter surgery, tracheostomy inserted and later removed   Past Surgical History  Procedure Laterality Date  . Ganglion cyst excision    . Goiter    . Tonsillectomy    . Hernia repair  several yrs ago  . Total hip arthroplasty  06/16/2012    Procedure: TOTAL HIP ARTHROPLASTY ANTERIOR APPROACH;  Surgeon: Mauri Pole, MD;  Location: [facility];  Service: Orthopedics;  Laterality: Left;    reports that he quit smoking about 36 years ago. His smoking use included Cigarettes and Pipe. He has a 22.5 pack-year smoking history. He has never used smokeless tobacco. He reports that he does not drink alcohol or use illicit drugs. family history includes Alcohol abuse in his other; Cancer in  his mother. Allergies  Allergen Reactions  . Cefuroxime Axetil Anaphylaxis    REACTION: anaphylaxis  . Cefprozil     REACTION: unknown  . Rabeprazole Sodium Nausea Only    REACTION: nausea   Current Outpatient Prescriptions on File Prior to Visit  Medication Sig Dispense Refill  . ACCU-CHEK AVIVA PLUS test strip AS DIRECTED ONCE A DAY  100 each  11  . ACCU-CHEK FASTCLIX LANCETS MISC TWICE DAILY  102 each  11  . ADVAIR DISKUS 250-50 MCG/DOSE AEPB INHALE ONE PUFF AND RINSE MOUTH WELL TWICE DAILY (MAINTAINANCE INHALER)  180 each  6  . atorvastatin (LIPITOR) 10 MG tablet  TAKE ONE TABLET BY MOUTH ONCE DAILY  90 tablet  3  . BENICAR 20 MG tablet TAKE ONE TABLET BY MOUTH ONCE DAILY  90 tablet  3  . Blood Glucose Monitoring Suppl (ACCU-CHEK AVIVA PLUS) W/DEVICE KIT AS DIRECTED  1 kit  0  . carbamazepine (TEGRETOL) 200 MG tablet TAKE 2 TABLETS AT BEDTIME  180 tablet  3  . Cholecalciferol (VITAMIN D3) 2000 UNITS TABS Take 2,000 Units by mouth every morning.       . clobetasol cream (TEMOVATE) 7.06 % Apply 1 application topically 2 (two) times daily as needed. For eczema.      Marland Kitchen desonide (DESOWEN) 0.05 % lotion Apply 1 application topically daily as needed. For groin rash.      . fluticasone (FLONASE) 50 MCG/ACT nasal spray PLACE 1 SPRAY INTO NOSE DAILY  16 g  11  . Fluticasone Furoate-Vilanterol (BREO ELLIPTA) 100-25 MCG/INH AEPB Inhale 1 puff into the lungs daily.  1 each  0  . glucosamine-chondroitin 500-400 MG tablet Take 1 tablet by mouth every morning.       Marland Kitchen glucose blood test strip Use as instructed 1  Per day  250.02      . hydrochlorothiazide (HYDRODIURIL) 25 MG tablet TAKE ONE TABLET BY MOUTH ONCE DAILY  90 tablet  3  . levothyroxine (SYNTHROID, LEVOTHROID) 100 MCG tablet TAKE ONE TABLET BY MOUTH ONCE DAILY  90 tablet  3  . meloxicam (MOBIC) 15 MG tablet TAKE ONE TABLET EACH DAY  90 tablet  2  . metFORMIN (GLUCOPHAGE-XR) 500 MG 24 hr tablet 3 tabs by mouth per day  270 tablet  3  . Multiple Vitamin (MULTIVITAMIN) tablet Take 1 tablet by mouth every morning.       . pantoprazole (PROTONIX) 40 MG tablet TAKE ONE TABLET BY MOUTH ONCE DAILY  90 tablet  3  . PROAIR HFA 108 (90 BASE) MCG/ACT inhaler TWO PUFFS FOUR TIMES DAILY AS NEEDED  8.5 g  11  . PROCTOSOL HC 2.5 % rectal cream APPLY RECTALLY TWICE DAILY  28.35 g  2   No current facility-administered medications on file prior to visit.   Review of Systems Constitutional: Negative for increased diaphoresis, other activity, appetite or other siginficant weight change  HENT: Negative for worsening hearing  loss, ear pain, facial swelling, mouth sores and neck stiffness.   Eyes: Negative for other worsening pain, redness or visual disturbance.  Respiratory: Negative for shortness of breath and wheezing.   Cardiovascular: Negative for chest pain and palpitations.  Gastrointestinal: Negative for diarrhea, blood in stool, abdominal distention or other pain Genitourinary: Negative for hematuria, flank pain or change in urine volume.  Musculoskeletal: Negative for myalgias or other joint complaints.  Skin: Negative for color change and wound.  Neurological: Negative for syncope and numbness. other than noted Hematological: Negative for adenopathy.  or other swelling Psychiatric/Behavioral: Negative for hallucinations, self-injury, decreased concentration or other worsening agitation.      Objective:   Physical Exam BP 120/72  Pulse 68  Temp(Src) 98.4 F (36.9 C) (Oral)  Ht '5\' 11"'  (1.803 m)  Wt 263 lb (119.296 kg)  BMI 36.70 kg/m2  SpO2 96% Wt Readings from Last 3 Encounters:  01/07/14 263 lb (119.296 kg)  07/13/13 266 lb 12.8 oz (121.02 kg)  07/09/13 264 lb 12 oz (120.09 kg)   VS noted,  Constitutional: Pt is oriented to person, place, and time. Appears well-developed and well-nourished.  Head: Normocephalic and atraumatic.  Right Ear: External ear normal.  Left Ear: External ear normal.  Nose: Nose normal.  Mouth/Throat: Oropharynx is clear and moist.  Eyes: Conjunctivae and EOM are normal. Pupils are equal, round, and reactive to light.  Neck: Normal range of motion. Neck supple. No JVD present. No tracheal deviation present.  Cardiovascular: Normal rate, regular rhythm, normal heart sounds and intact distal pulses.   Pulmonary/Chest: Effort normal and breath sounds without rales or wheezing  Abdominal: Soft. Bowel sounds are normal. NT. No HSM  Musculoskeletal: Normal range of motion. Exhibits no edema.  Lymphadenopathy:  Has no cervical adenopathy.  Neurological: Pt is alert and  oriented to person, place, and time. Pt has normal reflexes. No cranial nerve deficit. Motor grossly intact Skin: Skin is warm and dry. No rash noted.  Psychiatric:  Has normal mood and affect. Behavior is normal.     Assessment & Plan:

## 2014-01-11 ENCOUNTER — Other Ambulatory Visit: Payer: Self-pay | Admitting: Internal Medicine

## 2014-01-14 ENCOUNTER — Ambulatory Visit: Payer: Medicare Other | Admitting: Internal Medicine

## 2014-01-17 ENCOUNTER — Encounter: Payer: Self-pay | Admitting: Internal Medicine

## 2014-01-17 ENCOUNTER — Ambulatory Visit (INDEPENDENT_AMBULATORY_CARE_PROVIDER_SITE_OTHER): Payer: Medicare Other | Admitting: Internal Medicine

## 2014-01-17 VITALS — BP 124/60 | HR 73 | Ht 71.5 in | Wt 265.6 lb

## 2014-01-17 DIAGNOSIS — G4733 Obstructive sleep apnea (adult) (pediatric): Secondary | ICD-10-CM

## 2014-01-17 NOTE — Progress Notes (Signed)
04/23/11- 70 yoM former smoker, followed for allergic rhinitis, asthma, complicated by DM, Bipolar, GERD LOV-04/24/2010 Has had flu vaccine and TDAP He has done well over the past year, off of allergy vaccine Continues CPAP at 9 CWP for his sleep apnea/[facility]. Asthma causes no problems unless he is in a very dusty environment and even then it is mild. He is no longer using Advair. Rarely uses rescue inhaler. Does use Flonase for rhinitis.  11/15/11- 70 yoM former smoker, followed for allergic rhinitis, asthma, complicated by DM, Bipolar, GERD Sleep apnea control remains good at 9 CWP/ [facility]. Uses chin strap. Still gets dry mouth if lying on his back. Dyspnea with exertion is stable and chronic. No cough. 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 CXR 05/09/11- reviewed with him IMPRESSION:  1. Some interval increase in linear lingular scarring or  atelectasis.  Original Report Authenticated By: Trecia Rogers, M.D.    05/22/12- 71 yoM former smoker, followed for allergic rhinitis, asthma, complicated by DM, Bipolar, GERD FOLLOWS FOR: wears CPAP every night from 1130pm to 12am to 6-7am; still having episodes of waking up and feels like something is going to happen(anxiety) having more stress. Needs surgery clearance for hip surgery CPAP 9/[facility] with good compliance and control. He gets some dry mouth and we discussed the humidifier. He will take his own mask to the hospital her surgery and they will supply the machine. He had been going to a gym regularly 3 times a week for many years, recently limited by his hip pain. Little cough with no wheeze or phlegm. Still notices dyspnea on exertion, probably unchanged over 8-10 years. He says he is short of breath walking one block on level ground but he had been able to exercise hard at the gym. CXR 11/14/11-reviewed and compared with earlier images IMPRESSION:  Chronic left basilar  atelectasis versus scarring.  Mild chronic bronchitic and interstitial lung disease changes.  Original Report Authenticated By: Burnetta Sabin, M.D.  Office Spirometry 05/22/12-normal spirometry. FVC 3.85/83%, FEV1 2.88/81%, FEV1/FVC 0.75, FEF 25-75% 2.17/69%.  08/17/12- 71 yoM former smoker, followed for allergic rhinitis, asthma, complicated by DM, Bipolar, GERD ACUTE VISIT: cough-"gray colored mucus with green sinus" x 5 days; started zpak 3 days ago. Cough gray, nasal green. Eye itches,  But no fever, HA, malaise, blood.  Grandson visited, bringing a cold. Using Afrin- discussed. Had hip replacement and is concerned about potential joint infection if respiratory infection lingers.  Doing fine with CPAP 9/ [facility].  CXR 06/02/12-  IMPRESSION:  Stable exam. Basilar atelectasis versus scarring.  Original Report Authenticated By: Jerilynn Mages. Shick, M.D.  01/14/13- 13 yoM former smoker, followed for allergic rhinitis, asthma, complicated by DM, Bipolar, GERD FOLLOWS FOR: SOB when wokring out(unable to get deep breath). only able to work out 2 times a week now.  Doing fine with CPAP 9/ [facility], but says he would like to try 10. Feels very well in chest without cough or wheeze. Exercising less.  07/13/13- 37 yoM former smoker, followed for allergic rhinitis, asthma, complicated by DM, Bipolar, GERD ACUTE VISIT: Wife was sick around [place] got it from her. Stayed sick all of January as well; had a period of 5 days that his lungs "ached".  Was seen by PCP Dr Jenny Reichmann Friday 07-09-13; was told PCP felt like he could still have PNA. He denies fever or discolored sputum but asks Z pak to hold. CPAP 10/ [facility]- doing  well and pressure ok.  CXR 07/09/13 IMPRESSION:  No active cardiopulmonary disease.  Electronically Signed  By: Logan Bores  On: 07/09/2013 12:07  01/17/14-72 yoM former smoker, followed for allergic rhinitis, asthma, complicated by DM, Bipolar, GERD Follows For: Wears cpap 10/ [facility] 8 hrs/night  - Mask fits well - Shoulder pain wakes pt up at night   ROS-see HPI Constitutional:   No-   weight loss, night sweats, fevers, chills, fatigue, lassitude. HEENT:   No-  headaches, difficulty swallowing, tooth/dental problems, sore throat,       No-  Sneezing,no- itching, ear ache, +nasal congestion, +post nasal drip,  CV:  No-   chest pain, orthopnea, PND, swelling in lower extremities, anasarca, dizziness, palpitations Resp: No-shortness of breath with exertion or at rest.              No-productive cough,  No non-productive cough,  No- coughing up of blood.              No-change in color of mucus.  No- wheezing.   Skin: No-   rash or lesions. GI:  No-   heartburn, indigestion, abdominal pain, nausea, vomiting,  GU: . MS:  No-   joint pain or swelling.  Neuro-     nothing unusual Psych:  No- change in mood or affect. No depression or anxiety.  No memory loss.   OBJ- General- Alert, Oriented, Affect-appropriate, Distress- none acute, mild hoarseness. Heavy           set/ muscular Skin- rash-none, lesions- none, excoriation- none Lymphadenopathy- none Head- atraumatic            Eyes- Gross vision intact, PERRLA, conjunctivae clear secretions            Ears- +Hearing aids/ HOH            Nose- Clear, no-Septal dev, mucus, polyps, erosion, perforation             Throat- Mallampati IV , mucosa clear , drainage- none, tonsils present Neck- flexible , trachea midline, no stridor , thyroid nl, carotid no bruit Chest - symmetrical excursion , unlabored           Heart/CV- RRR , no murmur , no gallop  , no rub, nl s1 s2                           - JVD- none , edema- none, stasis changes- none, varices- none           Lung- clear to P&A, wheeze- none, cough-none , dullness-none, rub- none           Chest wall-  Abd-  Br/ Gen/ Rectal- Not done, not indicated Extrem- cyanosis- none, clubbing, none, atrophy- none, strength- nl Neuro- grossly intact to observation

## 2014-01-17 NOTE — Patient Instructions (Signed)
Ok to use your rescuer inhaler Proair  2 puffs, up to 4 times daily if needed  If you need to use the Proair more than a few times per week, then it will be time to restart your maintenance controller- Advair 1 puff then rinse mouth, twice daily  We can continue CPAP 10/ [facility]  Order- DME [facility] download CPAP for pressure compliance     Dx OSA

## 2014-02-15 ENCOUNTER — Ambulatory Visit (INDEPENDENT_AMBULATORY_CARE_PROVIDER_SITE_OTHER): Payer: Medicare Other

## 2014-02-15 ENCOUNTER — Telehealth: Payer: Self-pay | Admitting: Internal Medicine

## 2014-02-15 DIAGNOSIS — Z23 Encounter for immunization: Secondary | ICD-10-CM

## 2014-02-15 NOTE — Telephone Encounter (Signed)
Spoke with the pt  He is asking if he can come in for pneumovax "booster"  I advised that since he had prevnar in May 2015 this is not needed  He has already had flu vaccine  Nothing needed

## 2014-02-19 ENCOUNTER — Other Ambulatory Visit: Payer: Self-pay | Admitting: Internal Medicine

## 2014-02-21 ENCOUNTER — Other Ambulatory Visit: Payer: Self-pay | Admitting: Internal Medicine

## 2014-03-14 ENCOUNTER — Telehealth: Payer: Self-pay

## 2014-03-14 NOTE — Telephone Encounter (Signed)
Left a message for call back.  Called patient regarding diabetic eye exam.  When patient calls back please ask:  Have you had a recent (2014-2015) eye exam?    Date of Exam?  Where?    

## 2014-04-18 ENCOUNTER — Other Ambulatory Visit: Payer: Self-pay | Admitting: Internal Medicine

## 2014-05-02 NOTE — Telephone Encounter (Signed)
No call back from patient.  Encounter closed.   

## 2014-05-16 ENCOUNTER — Telehealth: Payer: Self-pay | Admitting: Internal Medicine

## 2014-05-16 NOTE — Telephone Encounter (Signed)
Pt called in and has several questions about his medication.  The metformin 500 mg

## 2014-05-16 NOTE — Telephone Encounter (Signed)
This is OK for now, and we plan to re-check his a1c with next visit

## 2014-05-16 NOTE — Telephone Encounter (Signed)
Called pt wife back she stated husband been taking metformin 500 mg 3 pills with breakfast. BS has been running anywhere from 131-150. Wanting to know md recommendation. Does he need to space dosing out instead of taking all at once. Inform wife md is out of office today, but will give her call back tomorrow with his response...Gust Rung

## 2014-05-17 NOTE — Telephone Encounter (Signed)
Patients wife informed of PCP instructions. 

## 2014-07-11 ENCOUNTER — Other Ambulatory Visit: Payer: Self-pay | Admitting: Internal Medicine

## 2014-07-27 DIAGNOSIS — G4733 Obstructive sleep apnea (adult) (pediatric): Secondary | ICD-10-CM | POA: Diagnosis not present

## 2014-08-15 ENCOUNTER — Other Ambulatory Visit: Payer: Self-pay | Admitting: Internal Medicine

## 2014-09-06 ENCOUNTER — Other Ambulatory Visit: Payer: Self-pay

## 2014-09-06 ENCOUNTER — Other Ambulatory Visit (INDEPENDENT_AMBULATORY_CARE_PROVIDER_SITE_OTHER): Payer: Medicare Other

## 2014-09-06 DIAGNOSIS — I10 Essential (primary) hypertension: Secondary | ICD-10-CM | POA: Diagnosis not present

## 2014-09-06 DIAGNOSIS — E785 Hyperlipidemia, unspecified: Secondary | ICD-10-CM | POA: Diagnosis not present

## 2014-09-06 DIAGNOSIS — Z0000 Encounter for general adult medical examination without abnormal findings: Secondary | ICD-10-CM

## 2014-09-06 DIAGNOSIS — E039 Hypothyroidism, unspecified: Secondary | ICD-10-CM | POA: Diagnosis not present

## 2014-09-06 LAB — LIPID PANEL
CHOLESTEROL: 168 mg/dL (ref 0–200)
HDL: 46.2 mg/dL (ref >39.00)
NonHDL: 121.8
Total CHOL/HDL Ratio: 4
Triglycerides: 215 mg/dL — ABNORMAL HIGH (ref 0.0–149.0)
VLDL: 43 mg/dL — ABNORMAL HIGH (ref 0.0–40.0)

## 2014-09-06 LAB — HEPATIC FUNCTION PANEL
ALT: 44 U/L (ref 0–53)
AST: 29 U/L (ref 0–37)
Albumin: 4.2 g/dL (ref 3.5–5.2)
Alkaline Phosphatase: 66 U/L (ref 39–117)
BILIRUBIN DIRECT: 0.1 mg/dL (ref 0.0–0.3)
Total Bilirubin: 0.4 mg/dL (ref 0.2–1.2)
Total Protein: 6.7 g/dL (ref 6.0–8.3)

## 2014-09-06 LAB — BASIC METABOLIC PANEL
BUN: 27 mg/dL — AB (ref 6–23)
CALCIUM: 9.8 mg/dL (ref 8.4–10.5)
CHLORIDE: 103 meq/L (ref 96–112)
CO2: 28 meq/L (ref 19–32)
CREATININE: 1.47 mg/dL (ref 0.40–1.50)
GFR: 50.13 mL/min — ABNORMAL LOW (ref >60.00)
Glucose, Bld: 138 mg/dL — ABNORMAL HIGH (ref 70–99)
Potassium: 4.5 mEq/L (ref 3.5–5.1)
Sodium: 139 mEq/L (ref 135–145)

## 2014-09-06 LAB — CBC WITH DIFFERENTIAL/PLATELET
BASOS ABS: 0 10*3/uL (ref 0.0–0.1)
Basophils Relative: 0.6 % (ref 0.0–3.0)
EOS ABS: 0.3 10*3/uL (ref 0.0–0.7)
EOS PCT: 5.1 % — AB (ref 0.0–5.0)
HCT: 40.6 % (ref 39.0–52.0)
Hemoglobin: 13.8 g/dL (ref 13.0–17.0)
LYMPHS ABS: 1.8 10*3/uL (ref 0.7–4.0)
LYMPHS PCT: 27.1 % (ref 12.0–46.0)
MCHC: 34.1 g/dL (ref 30.0–36.0)
MCV: 89.4 fl (ref 78.0–100.0)
MONO ABS: 0.5 10*3/uL (ref 0.1–1.0)
Monocytes Relative: 7.7 % (ref 3.0–12.0)
NEUTROS PCT: 59.5 % (ref 43.0–77.0)
Neutro Abs: 4 10*3/uL (ref 1.4–7.7)
PLATELETS: 198 10*3/uL (ref 150.0–400.0)
RBC: 4.54 Mil/uL (ref 4.22–5.81)
RDW: 13.2 % (ref 11.5–15.5)
WBC: 6.8 10*3/uL (ref 4.0–10.5)

## 2014-09-06 LAB — URINALYSIS, ROUTINE W REFLEX MICROSCOPIC
Bilirubin Urine: NEGATIVE
HGB URINE DIPSTICK: NEGATIVE
KETONES UR: NEGATIVE
Leukocytes, UA: NEGATIVE
Nitrite: NEGATIVE
SPECIFIC GRAVITY, URINE: 1.025 (ref 1.000–1.030)
Total Protein, Urine: NEGATIVE
URINE GLUCOSE: NEGATIVE
Urobilinogen, UA: 0.2 (ref 0.0–1.0)
pH: 5.5 (ref 5.0–8.0)

## 2014-09-06 LAB — LDL CHOLESTEROL, DIRECT: Direct LDL: 92 mg/dL

## 2014-09-06 LAB — TSH: TSH: 6.09 u[IU]/mL — ABNORMAL HIGH (ref 0.35–4.50)

## 2014-09-06 LAB — PSA: PSA: 0.33 ng/mL (ref 0.10–4.00)

## 2014-09-09 ENCOUNTER — Ambulatory Visit (INDEPENDENT_AMBULATORY_CARE_PROVIDER_SITE_OTHER): Payer: Medicare Other | Admitting: Internal Medicine

## 2014-09-09 ENCOUNTER — Other Ambulatory Visit (INDEPENDENT_AMBULATORY_CARE_PROVIDER_SITE_OTHER): Payer: Medicare Other

## 2014-09-09 ENCOUNTER — Encounter: Payer: Self-pay | Admitting: Internal Medicine

## 2014-09-09 VITALS — BP 128/60 | HR 70 | Temp 98.5°F | Resp 18 | Ht 72.0 in | Wt 262.0 lb

## 2014-09-09 DIAGNOSIS — E039 Hypothyroidism, unspecified: Secondary | ICD-10-CM

## 2014-09-09 DIAGNOSIS — M545 Low back pain, unspecified: Secondary | ICD-10-CM

## 2014-09-09 DIAGNOSIS — E1340 Other specified diabetes mellitus with diabetic neuropathy, unspecified: Secondary | ICD-10-CM | POA: Diagnosis not present

## 2014-09-09 DIAGNOSIS — Z0000 Encounter for general adult medical examination without abnormal findings: Secondary | ICD-10-CM

## 2014-09-09 LAB — HEMOGLOBIN A1C: Hgb A1c MFr Bld: 6.9 % — ABNORMAL HIGH (ref 4.6–6.5)

## 2014-09-09 MED ORDER — LEVOTHYROXINE SODIUM 112 MCG PO TABS: 112.0000 ug | ORAL_TABLET | Freq: Every day | ORAL | Status: DC

## 2014-09-09 NOTE — Patient Instructions (Addendum)
OK to increase the levothyroxine to 112 mcg per day  Please continue all other medications as before, and refills have been done if requested.  Please have the pharmacy call with any other refills you may need.  Please continue your efforts at being more active, low cholesterol diet, and weight control.  You are otherwise up to date with prevention measures today.  Please keep your appointments with your specialists as you may have planned  You will be contacted regarding the referral for:  Dr Smith/sport med  Please go to the LAB in the Basement (turn left off the elevator) for the tests to be done today  Please remember to sign up for MyChart if you have not done so, as this will be important to you in the future with finding out test results, communicating by private email, and scheduling acute appointments online when needed.  Please return in 6 months, or sooner if needed, with Lab testing done 3-5 days before

## 2014-09-09 NOTE — Progress Notes (Signed)
Subjective:    Patient ID: Joseph Villa, male    DOB: 1940-11-07, 74 y.o.   MRN: [id number]  HPI  Here for wellness and f/u;  Overall doing ok;  Pt denies Chest pain, worsening SOB, DOE, wheezing, orthopnea, PND, worsening LE edema, palpitations, dizziness or syncope.  Pt denies neurological change such as new headache, facial or extremity weakness.  Pt denies polydipsia, polyuria, or low sugar symptoms. Pt states overall good compliance with treatment and medications, good tolerability, and has been trying to follow appropriate diet.  Pt denies worsening depressive symptoms, suicidal ideation or panic. No fever, night sweats, wt loss, loss of appetite, or other constitutional symptoms.  Pt states good ability with ADL's, has low fall risk, home safety reviewed and adequate, no other significant changes in hearing or vision, and only occasionally active with exercise. Stopped his statin 1 wk ago.  Has been on for approx 1 yr, recently started exercise on his outside bike and then inside elliptical, has prox LLE pain and left lower back pain, also some aches to the right lateral leg area.  Wants to stop his statin.  Having occas diarrhea assoc with diet drinks he thinks.   Past Medical History  Diagnosis Date  . ALLERGIC ASTHMA 11/25/2007  . ALLERGIC RHINITIS 11/25/2007  . BIPOLAR DISORDER UNSPECIFIED 10/06/2009  . COLONIC POLYPS, HX OF 10/06/2009  . DM w/o Complication Type II 71/07/4578  . GERD 10/06/2009  . HYPERLIPIDEMIA 10/06/2009  . HYPERTENSION, BENIGN 07/27/2009  . HYPOTHYROIDISM 10/06/2009  . NEPHROLITHIASIS, HX OF 10/06/2009  . OBSTRUCTIVE SLEEP APNEA 11/25/2007    cpap setting of 9  . DJD (degenerative joint disease)   . Degenerative arthritis of hip 04/18/2011  . Arthritis, lumbar spine 04/18/2011  . Hemorrhoid   . Hemorrhage yrs ago    after goiter surgery, tracheostomy inserted and later removed   Past Surgical History  Procedure Laterality Date  . Ganglion cyst excision    . Goiter     . Tonsillectomy    . Hernia repair  several yrs ago  . Total hip arthroplasty  06/16/2012    Procedure: TOTAL HIP ARTHROPLASTY ANTERIOR APPROACH;  Surgeon: Mauri Pole, MD;  Location: [facility];  Service: Orthopedics;  Laterality: Left;    reports that he quit smoking about 37 years ago. His smoking use included Cigarettes and Pipe. He has a 22.5 pack-year smoking history. He has never used smokeless tobacco. He reports that he does not drink alcohol or use illicit drugs. family history includes Alcohol abuse in his other; Cancer in his mother. Allergies  Allergen Reactions  . Cefuroxime Axetil Anaphylaxis    REACTION: anaphylaxis  . Cefprozil     REACTION: unknown  . Rabeprazole Sodium Nausea Only    REACTION: nausea   Current Outpatient Prescriptions on File Prior to Visit  Medication Sig Dispense Refill  . ACCU-CHEK FASTCLIX LANCETS MISC USE TWICE DAILY 102 each 11  . ADVAIR DISKUS 250-50 MCG/DOSE AEPB INHALE ONE PUFF AND RINSE MOUTH WELL TWICE DAILY (MAINTAINANCE INHALER) 180 each 6  . BENICAR 20 MG tablet TAKE ONE TABLET EACH DAY 90 tablet 3  . Blood Glucose Monitoring Suppl (ACCU-CHEK AVIVA PLUS) W/DEVICE KIT AS DIRECTED 1 kit 0  . carbamazepine (TEGRETOL) 200 MG tablet TAKE 2 TABLETS AT BEDTIME 180 tablet 3  . Cholecalciferol (VITAMIN D3) 2000 UNITS TABS Take 2,000 Units by mouth every morning.     . clobetasol cream (TEMOVATE) 9.98 % Apply 1 application topically 2 (  two) times daily as needed. For eczema.    Marland Kitchen desonide (DESOWEN) 0.05 % lotion Apply 1 application topically daily as needed. For groin rash.    . fluticasone (FLONASE) 50 MCG/ACT nasal spray ONE SPRAY INTO NOSE DAILY 16 g 11  . glucosamine-chondroitin 500-400 MG tablet Take 1 tablet by mouth every morning.     Marland Kitchen glucose blood (ACCU-CHEK AVIVA PLUS) test strip CHECK BLOOD SUGAR ONCE A DAY Dx 250.60 100 each 3  . glucose blood test strip Use as instructed 1  Per day  250.02    . hydrochlorothiazide (HYDRODIURIL) 25 MG  tablet TAKE ONE TABLET EACH DAY 90 tablet 2  . levothyroxine (SYNTHROID, LEVOTHROID) 100 MCG tablet TAKE ONE TABLET EACH DAY 90 tablet 2  . meloxicam (MOBIC) 15 MG tablet TAKE ONE TABLET EACH DAY 90 tablet 2  . metFORMIN (GLUCOPHAGE-XR) 500 MG 24 hr tablet 3 tabs by mouth per day 270 tablet 3  . Multiple Vitamin (MULTIVITAMIN) tablet Take 1 tablet by mouth every morning.     . pantoprazole (PROTONIX) 40 MG tablet TAKE ONE TABLET EACH DAY 90 tablet 2  . PROAIR HFA 108 (90 BASE) MCG/ACT inhaler TWO PUFFS FOUR TIMES DAILY AS NEEDED 8.5 g 11  . PROCTOZONE-HC 2.5 % rectal cream APPLY TWICE DAILY RECTALLY 30 g 0   No current facility-administered medications on file prior to visit.    Review of Systems Constitutional: Negative for increased diaphoresis, other activity, appetite or siginficant weight change other than noted HENT: Negative for worsening hearing loss, ear pain, facial swelling, mouth sores and neck stiffness.   Eyes: Negative for other worsening pain, redness or visual disturbance.  Respiratory: Negative for shortness of breath and wheezing  Cardiovascular: Negative for chest pain and palpitations.  Gastrointestinal: Negative for diarrhea, blood in stool, abdominal distention or other pain Genitourinary: Negative for hematuria, flank pain or change in urine volume.  Musculoskeletal: Negative for myalgias or other joint complaints.  Skin: Negative for color change and wound or drainage.  Neurological: Negative for syncope and numbness. other than noted Hematological: Negative for adenopathy. or other swelling Psychiatric/Behavioral: Negative for hallucinations, SI, self-injury, decreased concentration or other worsening agitation.      Objective:   Physical Exam BP 128/60 mmHg  Pulse 70  Temp(Src) 98.5 F (36.9 C) (Oral)  Resp 18  Ht 6' (1.829 m)  Wt 262 lb (118.842 kg)  BMI 35.53 kg/m2  SpO2 96% VS noted,  Constitutional: Pt is oriented to person, place, and time.  Appears well-developed and well-nourished, in no significant distress Head: Normocephalic and atraumatic.  Right Ear: External ear normal.  Left Ear: External ear normal.  Nose: Nose normal.  Mouth/Throat: Oropharynx is clear and moist.  Eyes: Conjunctivae and EOM are normal. Pupils are equal, round, and reactive to light.  Neck: Normal range of motion. Neck supple. No JVD present. No tracheal deviation present or significant neck LA or mass Cardiovascular: Normal rate, regular rhythm, normal heart sounds and intact distal pulses.   Pulmonary/Chest: Effort normal and breath sounds without rales or wheezing  Abdominal: Soft. Bowel sounds are normal. NT. No HSM  Musculoskeletal: Normal range of motion. Exhibits no edema.  Lymphadenopathy:  Has no cervical adenopathy.  Neurological: Pt is alert and oriented to person, place, and time. Pt has normal reflexes. No cranial nerve deficit. Motor grossly intact Skin: Skin is warm and dry. No rash noted.  Psychiatric:  Has normal mood and affect. Behavior is normal.   Wt Readings from  Last 3 Encounters:  09/09/14 262 lb (118.842 kg)  01/17/14 265 lb 9.6 oz (120.475 kg)  01/07/14 263 lb (119.296 kg)       Assessment & Plan:

## 2014-09-09 NOTE — Progress Notes (Signed)
Pre visit review using our clinic review tool, if applicable. No additional management support is needed unless otherwise documented below in the visit note. 

## 2014-09-09 NOTE — Assessment & Plan Note (Signed)
stable overall by history and exam, recent data reviewed with pt, and pt to continue medical treatment as before,  to f/u any worsening symptoms or concerns;le For a1c as was not done prior to visit

## 2014-09-09 NOTE — Assessment & Plan Note (Signed)

## 2014-09-09 NOTE — Assessment & Plan Note (Signed)
Mild uncontrolled, for incr levothyrox to 112 qd

## 2014-09-12 ENCOUNTER — Encounter: Payer: Self-pay | Admitting: Internal Medicine

## 2014-09-23 ENCOUNTER — Ambulatory Visit (INDEPENDENT_AMBULATORY_CARE_PROVIDER_SITE_OTHER): Payer: Medicare Other | Admitting: Family Medicine

## 2014-09-23 ENCOUNTER — Other Ambulatory Visit: Payer: Self-pay | Admitting: Internal Medicine

## 2014-09-23 ENCOUNTER — Encounter: Payer: Self-pay | Admitting: Family Medicine

## 2014-09-23 VITALS — BP 144/76 | HR 69 | Wt 265.0 lb

## 2014-09-23 DIAGNOSIS — M47816 Spondylosis without myelopathy or radiculopathy, lumbar region: Secondary | ICD-10-CM

## 2014-09-23 DIAGNOSIS — M479 Spondylosis, unspecified: Secondary | ICD-10-CM

## 2014-09-23 NOTE — Progress Notes (Signed)
[facility] [address] [place], [place] [address] Phone: [phone] Subjective:    I'm seeing this patient by the request  of:  Cathlean Cower, MD   CC: low back pain  [id number] Joseph Villa is a 74 y.o. male coming in with complaint of low back pain. Patient states that he has had this pain for multiple years. Patient states he was very active when he was young and had back pain even then. Now seems to be more of a dull throbbing aching sensation that seems to be worse since his total hip replacement on the left side. No significant radiation down the leg. Does not really stop him from any significant activity but can be uncomfortable. Does not keep him up at night. Denies any fevers, chills, or any abnormal weight loss. Denies any weakness of the lower extremities. Patient still rides motorcycles and goes to the gym at least 3 times a week. Patient has been told before that he has had arthritis of the spine but I do not have any imaging. Rates the severity of pain a 4 out of 10. Patient is wanting to remain active.      Past Medical History  Diagnosis Date  . ALLERGIC ASTHMA 11/25/2007  . ALLERGIC RHINITIS 11/25/2007  . BIPOLAR DISORDER UNSPECIFIED 10/06/2009  . COLONIC POLYPS, HX OF 10/06/2009  . DM w/o Complication Type II 123456  . GERD 10/06/2009  . HYPERLIPIDEMIA 10/06/2009  . HYPERTENSION, BENIGN 07/27/2009  . HYPOTHYROIDISM 10/06/2009  . NEPHROLITHIASIS, HX OF 10/06/2009  . OBSTRUCTIVE SLEEP APNEA 11/25/2007    cpap setting of 9  . DJD (degenerative joint disease)   . Degenerative arthritis of hip 04/18/2011  . Arthritis, lumbar spine 04/18/2011  . Hemorrhoid   . Hemorrhage yrs ago    after goiter surgery, tracheostomy inserted and later removed   Past Surgical History  Procedure Laterality Date  . Ganglion cyst excision    . Goiter    . Tonsillectomy    . Hernia repair  several yrs ago  . Total hip arthroplasty  06/16/2012    Procedure:  TOTAL HIP ARTHROPLASTY ANTERIOR APPROACH;  Surgeon: Mauri Pole, MD;  Location: [facility];  Service: Orthopedics;  Laterality: Left;   History  Substance Use Topics  . Smoking status: Former Smoker -- 1.50 packs/day for 15 years    Types: Cigarettes, Pipe    Quit date: 06/23/1977  . Smokeless tobacco: Never Used  . Alcohol Use: No   Family History  Problem Relation Age of Onset  . Cancer Mother   . Alcohol abuse Other    Allergies  Allergen Reactions  . Cefuroxime Axetil Anaphylaxis    REACTION: anaphylaxis  . Cefprozil     REACTION: unknown  . Rabeprazole Sodium Nausea Only    REACTION: nausea     Past medical history, social, surgical and family history all reviewed in electronic medical record.   Review of Systems: No headache, visual changes, nausea, vomiting, diarrhea, constipation, dizziness, abdominal pain, skin rash, fevers, chills, night sweats, weight loss, swollen lymph nodes, body aches, joint swelling, muscle aches, chest pain, shortness of breath, mood changes.   Objective Blood pressure 144/76, pulse 69, weight 265 lb (120.203 kg), SpO2 97 %.  General: No apparent distress alert and oriented x3 mood and affect normal, dressed appropriately.  HEENT: Pupils equal, extraocular movements intact  Respiratory: Patient's speak in full sentences and does not appear short of breath  Cardiovascular: No lower  extremity edema, non tender, no erythema  Skin: Warm dry intact with no signs of infection or rash on extremities or on axial skeleton.  Abdomen: Soft nontender  Neuro: Cranial nerves II through XII are intact, neurovascularly intact in all extremities with 2+ DTRs and 2+ pulses.  Lymph: No lymphadenopathy of posterior or anterior cervical chain or axillae bilaterally.  Gait mild antalgic gait.  MSK:  Non tender with full range of motion and good stability and symmetric strength and tone of shoulders, elbows, wrist, hip, knee and ankles bilaterally. Patient does have  a hip placement of the left side with mild leg length discrepancy. Left side quarter inch longer. Back Exam:  Inspection: Unremarkable  Motion: Flexion 25 deg, Extension 25 deg, Side Bending to 25 deg bilaterally,  Rotation to 25 deg bilaterally  SLR laying: Negative  XSLR laying: Negative  Palpable tenderness: Mild palpable tenderness over the left side of his back FABER: To tight to be able to do secondary to replacement. Sensory change: Gross sensation intact to all lumbar and sacral dermatomes.  Reflexes: 2+ at both patellar tendons, 2+ at achilles tendons, Babinski's downgoing.  Strength at foot  Plantar-flexion: 5/5 Dorsi-flexion: 5/5 Eversion: 5/5 Inversion: 5/5  Leg strength  Quad: 5/5 Hamstring: 5/5 Hip flexor: 5/5 Hip abductors: 4/5 but symmetric.     Impression and Recommendations:     This case required medical decision making of moderate complexity.

## 2014-09-23 NOTE — Patient Instructions (Signed)
Good to see you.  Ice 10 minutes after activity and before bed Take tylenol 650 mg three times a day is the best evidence based medicine we have for arthritis.  Glucosamine sulfate 1500mg  a day is a supplement that has been shown to help moderate to severe arthritis. Vitamin D 2000 IU daily Fish oil 2 grams daily.  Tumeric 500mg  twice daily.   CoQ10 400mg  daily can help the pain Capsaicin topically up to four times a day may also help with pain. It's important that you continue to stay active. Controlling your weight is important.  Water aerobics and cycling with low resistance are the best two types of exercise for arthritis. Come back and see me in 3 weeks.

## 2014-09-23 NOTE — Assessment & Plan Note (Signed)
Patient does have known degenerative arthritic changes of the lumbar spine. Discussed home exercises and patient was given a handout. We discussed icing regimen and over-the-counter natural supplementations a could be beneficial as well. We discussed what activities to do at the gym and which ones to potentially avoid. Patient will focus more on the negative lifting.  Patient will come back in 3 weeks for further evaluation and treatment. If continuing to have pain we may need to consider repeat imaging.

## 2014-09-23 NOTE — Progress Notes (Signed)
Pre visit review using our clinic review tool, if applicable. No additional management support is needed unless otherwise documented below in the visit note. 

## 2014-09-30 ENCOUNTER — Other Ambulatory Visit: Payer: Self-pay | Admitting: Dermatology

## 2014-09-30 DIAGNOSIS — L720 Epidermal cyst: Secondary | ICD-10-CM | POA: Diagnosis not present

## 2014-10-07 ENCOUNTER — Ambulatory Visit: Payer: Medicare Other | Admitting: Internal Medicine

## 2014-10-28 ENCOUNTER — Encounter: Payer: Self-pay | Admitting: Internal Medicine

## 2014-10-28 ENCOUNTER — Ambulatory Visit (INDEPENDENT_AMBULATORY_CARE_PROVIDER_SITE_OTHER): Payer: Medicare Other | Admitting: Internal Medicine

## 2014-10-28 VITALS — BP 126/58 | HR 73 | Ht 71.0 in | Wt 265.0 lb

## 2014-10-28 DIAGNOSIS — J302 Other seasonal allergic rhinitis: Secondary | ICD-10-CM

## 2014-10-28 DIAGNOSIS — J45998 Other asthma: Secondary | ICD-10-CM

## 2014-10-28 DIAGNOSIS — J309 Allergic rhinitis, unspecified: Secondary | ICD-10-CM

## 2014-10-28 DIAGNOSIS — G4733 Obstructive sleep apnea (adult) (pediatric): Secondary | ICD-10-CM

## 2014-10-28 DIAGNOSIS — J3089 Other allergic rhinitis: Secondary | ICD-10-CM

## 2014-10-28 MED ORDER — AZITHROMYCIN 250 MG PO TABS: ORAL_TABLET | ORAL | Status: DC

## 2014-10-28 NOTE — Assessment & Plan Note (Signed)
He describes good compliance and control except that chinstrap doesn't always hold. He understands he can try different masks working with his home care company. Plan-request download

## 2014-10-28 NOTE — Progress Notes (Signed)
04/23/11- 70 yoM former smoker, followed for allergic rhinitis, asthma, complicated by DM, Bipolar, GERD LOV-04/24/2010 Has had flu vaccine and TDAP He has done well over the past year, off of allergy vaccine Continues CPAP at 9 CWP for his sleep apnea/[facility]. Asthma causes no problems unless he is in a very dusty environment and even then it is mild. He is no longer using Advair. Rarely uses rescue inhaler. Does use Flonase for rhinitis.  11/15/11- 70 yoM former smoker, followed for allergic rhinitis, asthma, complicated by DM, Bipolar, GERD Sleep apnea control remains good at 9 CWP/ [facility]. Uses chin strap. Still gets dry mouth if lying on his back. Dyspnea with exertion is stable and chronic. No cough. 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 CXR 05/09/11- reviewed with him IMPRESSION:  1. Some interval increase in linear lingular scarring or  atelectasis.  Original Report Authenticated By: Trecia Rogers, M.D.    05/22/12- 71 yoM former smoker, followed for allergic rhinitis, asthma, complicated by DM, Bipolar, GERD FOLLOWS FOR: wears CPAP every night from 1130pm to 12am to 6-7am; still having episodes of waking up and feels like something is going to happen(anxiety) having more stress. Needs surgery clearance for hip surgery CPAP 9/[facility] with good compliance and control. He gets some dry mouth and we discussed the humidifier. He will take his own mask to the hospital her surgery and they will supply the machine. He had been going to a gym regularly 3 times a week for many years, recently limited by his hip pain. Little cough with no wheeze or phlegm. Still notices dyspnea on exertion, probably unchanged over 8-10 years. He says he is short of breath walking one block on level ground but he had been able to exercise hard at the gym. CXR 11/14/11-reviewed and compared with earlier images IMPRESSION:  Chronic left basilar  atelectasis versus scarring.  Mild chronic bronchitic and interstitial lung disease changes.  Original Report Authenticated By: Burnetta Sabin, M.D.  Office Spirometry 05/22/12-normal spirometry. FVC 3.85/83%, FEV1 2.88/81%, FEV1/FVC 0.75, FEF 25-75% 2.17/69%.  08/17/12- 71 yoM former smoker, followed for allergic rhinitis, asthma, complicated by DM, Bipolar, GERD ACUTE VISIT: cough-"gray colored mucus with green sinus" x 5 days; started zpak 3 days ago. Cough gray, nasal green. Eye itches,  But no fever, HA, malaise, blood.  Grandson visited, bringing a cold. Using Afrin- discussed. Had hip replacement and is concerned about potential joint infection if respiratory infection lingers.  Doing fine with CPAP 9/ [facility].  CXR 06/02/12-  IMPRESSION:  Stable exam. Basilar atelectasis versus scarring.  Original Report Authenticated By: Jerilynn Mages. Shick, M.D.  01/14/13- 33 yoM former smoker, followed for allergic rhinitis, asthma, complicated by DM, Bipolar, GERD FOLLOWS FOR: SOB when wokring out(unable to get deep breath). only able to work out 2 times a week now.  Doing fine with CPAP 9/ [facility], but says he would like to try 10. Feels very well in chest without cough or wheeze. Exercising less.  07/13/13- 12 yoM former smoker, followed for allergic rhinitis, asthma, complicated by DM, Bipolar, GERD ACUTE VISIT: Wife was sick around [place] got it from her. Stayed sick all of January as well; had a period of 5 days that his lungs "ached".  Was seen by PCP Dr Jenny Reichmann Friday 07-09-13; was told PCP felt like he could still have PNA. He denies fever or discolored sputum but asks Z pak to hold. CPAP 10/ [facility]- doing  well and pressure ok.  CXR 07/09/13 IMPRESSION:  No active cardiopulmonary disease.  Electronically Signed  By: Logan Bores  On: 07/09/2013 12:07  01/17/14-72 yoM former smoker, followed for allergic rhinitis, asthma, complicated by DM, Bipolar, GERD Follows For: Wears cpap 10/ [facility] 8 hrs/night  - Mask fits well - Shoulder pain wakes pt up at night  10/28/14- 73 yoM former smoker, followed for allergic rhinitis, asthma, complicated by DM, Bipolar, GERD Follows For: Wears CPAP 10/ [facility] 5-7 hours nightly, nasal pillows mask fits fine. No new complaints. Download requested.  Chin strap sometimes doesn't hold Flonase does pretty well for allergic rhinitis. Has pet bird Marty Heck that perches on him and is going to stay. Asthma control usually good unless he gets in a very dusty environment. He notices some dyspnea with exertion, not changed. He continues to exercise regularly with weights but avoids "cardio" after hip replacement, as instructed.  ROS-see HPI Constitutional:   No-   weight loss, night sweats, fevers, chills, fatigue, lassitude. HEENT:   No-  headaches, difficulty swallowing, tooth/dental problems, sore throat,       No-  Sneezing,no- itching, ear ache, +nasal congestion, +post nasal drip,  CV:  No-   chest pain, orthopnea, PND, swelling in lower extremities, anasarca, dizziness, palpitations Resp: +-shortness of breath with exertion or at rest.              No-productive cough,  No non-productive cough,  No- coughing up of blood.              No-change in color of mucus.  No- wheezing.   Skin: No-   rash or lesions. GI:  No-   heartburn, indigestion, abdominal pain, nausea, vomiting,  GU: . MS:  No-   joint pain or swelling.  Neuro-     nothing unusual Psych:  No- change in mood or affect. No depression or anxiety.  No memory loss.   OBJ- General- Alert, Oriented, Affect-appropriate, Distress- none acute, mild hoarseness. Heavy set/ muscular Skin- rash-none, lesions- none, excoriation- none Lymphadenopathy- none Head- atraumatic            Eyes- Gross vision intact, PERRLA, conjunctivae clear secretions            Ears- +Hearing aids/ HOH            Nose- Clear, no-Septal dev, mucus, polyps, erosion, perforation             Throat- Mallampati IV , mucosa clear ,  drainage- none, tonsils present Neck- flexible , trachea midline, no stridor , thyroid nl, carotid no bruit Chest - symmetrical excursion , unlabored           Heart/CV- RRR , no murmur , no gallop  , no rub, nl s1 s2                           - JVD- none , edema- none, stasis changes- none, varices- none           Lung- clear to P&A, wheeze- none, cough-none , dullness-none, rub- none           Chest wall-  Abd-  Br/ Gen/ Rectal- Not done, not indicated Extrem- cyanosis- none, clubbing, none, atrophy- none, strength- nl Neuro- grossly intact to observation

## 2014-10-28 NOTE — Assessment & Plan Note (Signed)
Good control Plan-Z-Pak to have available for travel as requested

## 2014-10-28 NOTE — Assessment & Plan Note (Signed)
He admits that his pet bird sheds a lot of dander on him as he handles it through most of each day but he thinks he tolerates it without problem. Flonase has generally been sufficient, used at intervals.

## 2014-10-28 NOTE — Patient Instructions (Signed)
We can continue CPAP 10/ [facility]  Please call as needed

## 2014-11-03 ENCOUNTER — Ambulatory Visit: Payer: Medicare Other | Admitting: Internal Medicine

## 2014-11-11 ENCOUNTER — Encounter: Payer: Self-pay | Admitting: Family Medicine

## 2014-11-11 ENCOUNTER — Ambulatory Visit (INDEPENDENT_AMBULATORY_CARE_PROVIDER_SITE_OTHER): Payer: Medicare Other | Admitting: Family Medicine

## 2014-11-11 VITALS — BP 138/74 | HR 70 | Ht 71.0 in | Wt 266.0 lb

## 2014-11-11 DIAGNOSIS — H3531 Nonexudative age-related macular degeneration: Secondary | ICD-10-CM | POA: Diagnosis not present

## 2014-11-11 DIAGNOSIS — E119 Type 2 diabetes mellitus without complications: Secondary | ICD-10-CM | POA: Diagnosis not present

## 2014-11-11 DIAGNOSIS — H2513 Age-related nuclear cataract, bilateral: Secondary | ICD-10-CM | POA: Diagnosis not present

## 2014-11-11 DIAGNOSIS — M479 Spondylosis, unspecified: Secondary | ICD-10-CM | POA: Diagnosis not present

## 2014-11-11 DIAGNOSIS — M47816 Spondylosis without myelopathy or radiculopathy, lumbar region: Secondary | ICD-10-CM

## 2014-11-11 DIAGNOSIS — H52203 Unspecified astigmatism, bilateral: Secondary | ICD-10-CM | POA: Diagnosis not present

## 2014-11-11 LAB — HM DIABETES EYE EXAM

## 2014-11-11 NOTE — Progress Notes (Signed)
Pre visit review using our clinic review tool, if applicable. No additional management support is needed unless otherwise documented below in the visit note. 

## 2014-11-11 NOTE — Assessment & Plan Note (Signed)
Discussed with patient at great length. I do believe that this is mostly his back and probably more of a secondary to a spinal stenosis. Patient has declined any further workup such as repeat x-rays. We discussed the possibility of starting gabapentin which patient wanted to avoid as well. We discussed doing the icing regimen and patient given new exercises for hip flexors that might be beneficial. We discussed with patient taking long stents off from doing the exercises to start them at 50% in increase slowly over the course of weeks when he does become more religious with doing these. Patient is going to be out of town for the next 2 months. Patient come back at that time for further evaluation. Patient 72 worsening symptoms I will need to be adamant that I would like to further workup his back. Encourage patient also follow-up for his hip pain with this surgeon.  Spent  25 minutes with patient face-to-face and had greater than 50% of counseling including as described above in assessment and plan.

## 2014-11-11 NOTE — Progress Notes (Signed)
[facility] [address], [place] [address] Phone: [phone] Subjective:    I'm seeing this patient by the request  of:  Cathlean Cower, MD   CC: low back pain  [id number] Joseph Villa is a 74 y.o. male coming in with complaint of low back pain. Patient states that he has had this pain for multiple years. Patient states he was very active when he was young and had back pain even then. Now seems to be more of a dull throbbing aching sensation that seems to be worse since his total hip replacement on the left side. Patient was seen previously and was given some exercises as well as some different diet changes and over-the-counter natural vitamins supplementation suggestions. Patient states that overall he is doing relatively well he can continue to do most of his daily activities. Patient states though that if he does anything more such as work out on a regular basis the next day he has significant tightness in his back as well as the hips. Patient states the legs can feel significantly fatigue when he does a lot of activity as well. Mostly over the anterior aspect of the quadriceps never going past the knees. Patient does state he has not been very religious with his exercises.    Past Medical History  Diagnosis Date  . ALLERGIC ASTHMA 11/25/2007  . ALLERGIC RHINITIS 11/25/2007  . BIPOLAR DISORDER UNSPECIFIED 10/06/2009  . COLONIC POLYPS, HX OF 10/06/2009  . DM w/o Complication Type II 123456  . GERD 10/06/2009  . HYPERLIPIDEMIA 10/06/2009  . HYPERTENSION, BENIGN 07/27/2009  . HYPOTHYROIDISM 10/06/2009  . NEPHROLITHIASIS, HX OF 10/06/2009  . OBSTRUCTIVE SLEEP APNEA 11/25/2007    cpap setting of 9  . DJD (degenerative joint disease)   . Degenerative arthritis of hip 04/18/2011  . Arthritis, lumbar spine 04/18/2011  . Hemorrhoid   . Hemorrhage yrs ago    after goiter surgery, tracheostomy inserted and later removed   Past Surgical History  Procedure  Laterality Date  . Ganglion cyst excision    . Goiter    . Tonsillectomy    . Hernia repair  several yrs ago  . Total hip arthroplasty  06/16/2012    Procedure: TOTAL HIP ARTHROPLASTY ANTERIOR APPROACH;  Surgeon: Mauri Pole, MD;  Location: [facility];  Service: Orthopedics;  Laterality: Left;   History  Substance Use Topics  . Smoking status: Former Smoker -- 1.50 packs/day for 15 years    Types: Cigarettes, Pipe    Quit date: 06/23/1977  . Smokeless tobacco: Never Used  . Alcohol Use: No   Family History  Problem Relation Age of Onset  . Cancer Mother   . Alcohol abuse Other    Allergies  Allergen Reactions  . Cefuroxime Axetil Anaphylaxis    REACTION: anaphylaxis  . Cefprozil     REACTION: unknown  . Rabeprazole Sodium Nausea Only    REACTION: nausea     Past medical history, social, surgical and family history all reviewed in electronic medical record.   Review of Systems: No headache, visual changes, nausea, vomiting, diarrhea, constipation, dizziness, abdominal pain, skin rash, fevers, chills, night sweats, weight loss, swollen lymph nodes, body aches, joint swelling, muscle aches, chest pain, shortness of breath, mood changes.   Objective Blood pressure 138/74, pulse 70, height 5\' 11"  (1.803 m), weight 266 lb (120.657 kg), SpO2 96 %.  General: No apparent distress alert and oriented x3 mood and affect normal, dressed appropriately.  HEENT: Pupils equal, extraocular movements intact  Respiratory: Patient's speak in full sentences and does not appear short of breath  Cardiovascular: No lower extremity edema, non tender, no erythema  Skin: Warm dry intact with no signs of infection or rash on extremities or on axial skeleton.  Abdomen: Soft nontender  Neuro: Cranial nerves II through XII are intact, neurovascularly intact in all extremities with 2+ DTRs and 2+ pulses.  Lymph: No lymphadenopathy of posterior or anterior cervical chain or axillae bilaterally.  Gait mild  antalgic gait.  MSK:  Non tender with full range of motion and good stability and symmetric strength and tone of shoulders, elbows, wrist, hip, knee and ankles bilaterally. Patient does have a hip placement of the left side with mild leg length discrepancy. Left side quarter inch longer. Back Exam:  Inspection: Unremarkable  Motion: Flexion 25 deg, Extension 25 deg, Side Bending to 25 deg bilaterally,  Rotation to 25 deg bilaterally no change from previous exam SLR laying: Negative  XSLR laying: Negative  Palpable tenderness: Continued tenderness of the paraspinal muscular from the left side of his back FABER: To tight to be able to do secondary to replacement. Sensory change: Gross sensation intact to all lumbar and sacral dermatomes.  Reflexes: 2+ at both patellar tendons, 2+ at achilles tendons, Babinski's downgoing.  Strength at foot  Plantar-flexion: 5/5 Dorsi-flexion: 5/5 Eversion: 5/5 Inversion: 5/5  Leg strength  Quad: 5/5 Hamstring: 5/5 Hip flexor: 5/5 Hip abductors: 4/5 but symmetric.     Impression and Recommendations:     This case required medical decision making of moderate complexity.

## 2014-11-11 NOTE — Patient Instructions (Addendum)
Good to see you.  Conitnue the vitamins We may need to consider working up for spinal stenosis or trying a medicine that can help New exercises for the stretching after working out.  Continue the protein after working out See me again in 6 weeks.

## 2014-11-18 ENCOUNTER — Other Ambulatory Visit: Payer: Self-pay | Admitting: Internal Medicine

## 2014-11-29 DIAGNOSIS — G4733 Obstructive sleep apnea (adult) (pediatric): Secondary | ICD-10-CM | POA: Diagnosis not present

## 2014-12-02 ENCOUNTER — Other Ambulatory Visit: Payer: Self-pay | Admitting: Internal Medicine

## 2014-12-15 ENCOUNTER — Telehealth: Payer: Self-pay | Admitting: Internal Medicine

## 2014-12-15 NOTE — Telephone Encounter (Signed)
Having trouble keeping blood sugar regulated.  Is on a capsule for this.  He would like to speak in regards to this to see if he should get a referral.

## 2014-12-16 DIAGNOSIS — L853 Xerosis cutis: Secondary | ICD-10-CM | POA: Diagnosis not present

## 2014-12-16 DIAGNOSIS — D1801 Hemangioma of skin and subcutaneous tissue: Secondary | ICD-10-CM | POA: Diagnosis not present

## 2014-12-16 DIAGNOSIS — L821 Other seborrheic keratosis: Secondary | ICD-10-CM | POA: Diagnosis not present

## 2014-12-16 DIAGNOSIS — L304 Erythema intertrigo: Secondary | ICD-10-CM | POA: Diagnosis not present

## 2014-12-16 DIAGNOSIS — Z85828 Personal history of other malignant neoplasm of skin: Secondary | ICD-10-CM | POA: Diagnosis not present

## 2014-12-21 NOTE — Telephone Encounter (Signed)
Left message on machine for pt's EC to return my call

## 2014-12-23 ENCOUNTER — Telehealth: Payer: Self-pay

## 2014-12-23 ENCOUNTER — Ambulatory Visit: Payer: Medicare Other | Admitting: Internal Medicine

## 2014-12-23 NOTE — Telephone Encounter (Signed)
Pt called stating that 3-4 weeks ago is blood sugars were elevated (170+ and one reading of 217) so he took an extra metformin for 3 days. Pt says that his blood sugars are now better (140-160). Pt just wanted MD to be aware

## 2014-12-23 NOTE — Telephone Encounter (Signed)
Left message on machine for pt to return my call. Closing phone note until further contact

## 2015-02-10 ENCOUNTER — Encounter: Payer: Self-pay | Admitting: Family Medicine

## 2015-02-10 ENCOUNTER — Ambulatory Visit (INDEPENDENT_AMBULATORY_CARE_PROVIDER_SITE_OTHER): Payer: Medicare Other | Admitting: Family Medicine

## 2015-02-10 VITALS — BP 132/80 | HR 73 | Wt 263.0 lb

## 2015-02-10 DIAGNOSIS — M7542 Impingement syndrome of left shoulder: Secondary | ICD-10-CM

## 2015-02-10 DIAGNOSIS — E108 Type 1 diabetes mellitus with unspecified complications: Principal | ICD-10-CM

## 2015-02-10 DIAGNOSIS — IMO0002 Reserved for concepts with insufficient information to code with codable children: Secondary | ICD-10-CM

## 2015-02-10 DIAGNOSIS — M47816 Spondylosis without myelopathy or radiculopathy, lumbar region: Secondary | ICD-10-CM

## 2015-02-10 DIAGNOSIS — M479 Spondylosis, unspecified: Secondary | ICD-10-CM | POA: Diagnosis not present

## 2015-02-10 DIAGNOSIS — E1069 Type 1 diabetes mellitus with other specified complication: Secondary | ICD-10-CM | POA: Diagnosis not present

## 2015-02-10 DIAGNOSIS — E1065 Type 1 diabetes mellitus with hyperglycemia: Secondary | ICD-10-CM

## 2015-02-10 NOTE — Assessment & Plan Note (Signed)
Concerned with patient I do think there is underlying spinal stenosis. X-rays ordered today but patient has refused to have them done. Overall is improving [place] thinks. Patient has any weakness or increasing symptoms I do think that further imaging is warranted. Patient denies any fevers or chills or any systemic findings and we can continue to monitor.

## 2015-02-10 NOTE — Assessment & Plan Note (Signed)
Discussed HEP given handout.  Ice is your friend.  Patient does not have any weakness I do not think a rotator cuff tear as there. We discussed the possibility of injection patient would like to avoid this. Patient will continue to be active. We discussed which activities to potentially avoid. Patient come back again in 4-6 weeks if not completely resolved.

## 2015-02-10 NOTE — Progress Notes (Signed)
[facility] [address] [place], [place] [address] Phone: [phone] Subjective:    I'm seeing this patient by the request  of:  Cathlean Cower, MD   CC: low back pain follow-up  [id number] Joseph Villa is a 74 y.o. male coming in with complaint of low back pain. Patient is at the low back pain seems to be doing better. Less radicular symptoms down the leg.  Patient is complaining more of a left shoulder discomfort. States that he woke up one morning and had severe pain. Like it was seen contralateral side of his shoulder where he has had a rotator cuff tear previously. Patient states that over the course of time and is improving. Patient would like to check noted to make sure there is nothing alarming. States certain movements to give him pain but no radicular symptoms or significant weakness.  Patient is also concern of his blood glucose. He has noticed that his been going higher over the course last several weeks. Patient states that he is averages our 170-190 and would like referral to endocrinology       Past Medical History  Diagnosis Date  . ALLERGIC ASTHMA 11/25/2007  . ALLERGIC RHINITIS 11/25/2007  . BIPOLAR DISORDER UNSPECIFIED 10/06/2009  . COLONIC POLYPS, HX OF 10/06/2009  . DM w/o Complication Type II 123456  . GERD 10/06/2009  . HYPERLIPIDEMIA 10/06/2009  . HYPERTENSION, BENIGN 07/27/2009  . HYPOTHYROIDISM 10/06/2009  . NEPHROLITHIASIS, HX OF 10/06/2009  . OBSTRUCTIVE SLEEP APNEA 11/25/2007    cpap setting of 9  . DJD (degenerative joint disease)   . Degenerative arthritis of hip 04/18/2011  . Arthritis, lumbar spine 04/18/2011  . Hemorrhoid   . Hemorrhage yrs ago    after goiter surgery, tracheostomy inserted and later removed  . Peptic stricture of esophagus    Past Surgical History  Procedure Laterality Date  . Ganglion cyst excision    . Goiter    . Tonsillectomy    . Hernia repair  several yrs ago  . Total hip arthroplasty   06/16/2012    Procedure: TOTAL HIP ARTHROPLASTY ANTERIOR APPROACH;  Surgeon: Mauri Pole, MD;  Location: [facility];  Service: Orthopedics;  Laterality: Left;   Social History  Substance Use Topics  . Smoking status: Former Smoker -- 1.50 packs/day for 15 years    Types: Cigarettes, Pipe    Quit date: 06/23/1977  . Smokeless tobacco: Never Used  . Alcohol Use: No   Family History  Problem Relation Age of Onset  . Cancer Mother   . Alcohol abuse Other    Allergies  Allergen Reactions  . Cefuroxime Axetil Anaphylaxis    REACTION: anaphylaxis  . Cefprozil     REACTION: unknown  . Rabeprazole Sodium Nausea Only    REACTION: nausea     Past medical history, social, surgical and family history all reviewed in electronic medical record.   Review of Systems: No headache, visual changes, nausea, vomiting, diarrhea, constipation, dizziness, abdominal pain, skin rash, fevers, chills, night sweats, weight loss, swollen lymph nodes, body aches, joint swelling, muscle aches, chest pain, shortness of breath, mood changes.   Objective There were no vitals taken for this visit.  General: No apparent distress alert and oriented x3 mood and affect normal, dressed appropriately.  HEENT: Pupils equal, extraocular movements intact  Respiratory: Patient's speak in full sentences and does not appear short of breath  Cardiovascular: No lower extremity edema, non tender, no erythema  Skin:  Warm dry intact with no signs of infection or rash on extremities or on axial skeleton.  Abdomen: Soft nontender  Neuro: Cranial nerves II through XII are intact, neurovascularly intact in all extremities with 2+ DTRs and 2+ pulses.  Lymph: No lymphadenopathy of posterior or anterior cervical chain or axillae bilaterally.  Gait mild antalgic gait.  MSK:  Non tender with full range of motion and good stability and symmetric strength and tone of  elbows, wrist, hip, knee and ankles bilaterally. Patient does have a hip  placement of the left side with mild leg length discrepancy. Left side quarter inch longer. Back Exam:  Inspection: Unremarkable  Motion: Flexion 25 deg, Extension 25 deg, Side Bending to 25 deg bilaterally,  Rotation to 25 deg bilaterally no change from previous exam SLR laying: Negative  XSLR laying: Negative  Palpable tenderness: Mild pain and appears normal musculature FABER: Continued tightness and irritated hip replacement Sensory change: Gross sensation intact to all lumbar and sacral dermatomes.  Reflexes: 2+ at both patellar tendons, 2+ at achilles tendons, Babinski's downgoing.  Strength at foot  Plantar-flexion: 5/5 Dorsi-flexion: 5/5 Eversion: 5/5 Inversion: 5/5  Leg strength  Quad: 5/5 Hamstring: 5/5 Hip flexor: 5/5 Hip abductors: 4/5 but symmetric.  Shoulder: Left Inspection reveals no abnormalities, atrophy or asymmetry. Palpation is normal with no tenderness over AC joint or bicipital groove. ROM is full in all planes. Rotator cuff strength normal throughout. Mild impingement signs Speeds and Yergason's tests normal. No labral pathology noted with negative Obrien's, negative clunk and good stability. Normal scapular function observed. No painful arc and no drop arm sign. No apprehension sign       Impression and Recommendations:     This case required medical decision making of moderate complexity.

## 2015-02-10 NOTE — Patient Instructions (Addendum)
Good to see you Ice can help after activity.  For the shoulder ice after activity and before bed.  Do exercises 3 times a week.  pennsaid pinkie amount topically 2 times daily as needed.

## 2015-02-14 ENCOUNTER — Ambulatory Visit (INDEPENDENT_AMBULATORY_CARE_PROVIDER_SITE_OTHER): Payer: Medicare Other | Admitting: Endocrinology

## 2015-02-14 ENCOUNTER — Encounter: Payer: Self-pay | Admitting: Endocrinology

## 2015-02-14 VITALS — BP 136/87 | HR 74 | Temp 98.3°F | Ht 71.0 in | Wt 262.0 lb

## 2015-02-14 DIAGNOSIS — E1340 Other specified diabetes mellitus with diabetic neuropathy, unspecified: Secondary | ICD-10-CM | POA: Diagnosis not present

## 2015-02-14 DIAGNOSIS — E1140 Type 2 diabetes mellitus with diabetic neuropathy, unspecified: Secondary | ICD-10-CM

## 2015-02-14 LAB — POCT GLYCOSYLATED HEMOGLOBIN (HGB A1C): HEMOGLOBIN A1C: 7.6

## 2015-02-14 MED ORDER — METFORMIN HCL ER 500 MG PO TB24: 500.0000 mg | ORAL_TABLET | Freq: Every day | ORAL | Status: DC

## 2015-02-14 MED ORDER — BROMOCRIPTINE MESYLATE 2.5 MG PO TABS: 1.2500 mg | ORAL_TABLET | Freq: Every day | ORAL | Status: DC

## 2015-02-14 NOTE — Progress Notes (Signed)
Subjective:    Patient ID: Joseph Villa, male    DOB: Sep 30, 1940, 74 y.o.   MRN: [id number]  HPI pt states DM was dx'ed in 2007; he has moderate neuropathy of the lower extremities; he is unaware of any associated chronic complications; he has never been on insulin; pt says his diet is not good, but exercise is good; he has never had pancreatitis, severe hypoglycemia or DKA.   Past Medical History  Diagnosis Date  . ALLERGIC ASTHMA 11/25/2007  . ALLERGIC RHINITIS 11/25/2007  . BIPOLAR DISORDER UNSPECIFIED 10/06/2009  . COLONIC POLYPS, HX OF 10/06/2009  . DM w/o Complication Type II 20/02/4708  . GERD 10/06/2009  . HYPERLIPIDEMIA 10/06/2009  . HYPERTENSION, BENIGN 07/27/2009  . HYPOTHYROIDISM 10/06/2009  . NEPHROLITHIASIS, HX OF 10/06/2009  . OBSTRUCTIVE SLEEP APNEA 11/25/2007    cpap setting of 9  . DJD (degenerative joint disease)   . Degenerative arthritis of hip 04/18/2011  . Arthritis, lumbar spine 04/18/2011  . Hemorrhoid   . Hemorrhage yrs ago    after goiter surgery, tracheostomy inserted and later removed  . Peptic stricture of esophagus     Past Surgical History  Procedure Laterality Date  . Ganglion cyst excision    . Goiter    . Tonsillectomy    . Hernia repair  several yrs ago  . Total hip arthroplasty  06/16/2012    Procedure: TOTAL HIP ARTHROPLASTY ANTERIOR APPROACH;  Surgeon: Mauri Pole, MD;  Location: [facility];  Service: Orthopedics;  Laterality: Left;    Social History   Social History  . Marital Status: Married    Spouse Name: N/A  . Number of Children: N/A  . Years of Education: N/A   Occupational History  . Not on file.   Social History Main Topics  . Smoking status: Former Smoker -- 1.50 packs/day for 15 years    Types: Cigarettes, Pipe    Quit date: 06/23/1977  . Smokeless tobacco: Never Used  . Alcohol Use: No  . Drug Use: No  . Sexual Activity: Not on file   Other Topics Concern  . Not on file   Social History Narrative    Current  Outpatient Prescriptions on File Prior to Visit  Medication Sig Dispense Refill  . ACCU-CHEK FASTCLIX LANCETS MISC USE TWICE DAILY 102 each 11  . ADVAIR DISKUS 250-50 MCG/DOSE AEPB INHALE ONE PUFF AND RINSE MOUTH WELL TWICE DAILY (MAINTAINANCE INHALER) 180 each 6  . BENICAR 20 MG tablet TAKE ONE TABLET EACH DAY 90 tablet 3  . Blood Glucose Monitoring Suppl (ACCU-CHEK AVIVA PLUS) W/DEVICE KIT AS DIRECTED 1 kit 0  . carbamazepine (TEGRETOL) 200 MG tablet TAKE 2 TABLETS AT BEDTIME 180 tablet 3  . Cholecalciferol (VITAMIN D3) 2000 UNITS TABS Take 2,000 Units by mouth every morning.     . clobetasol cream (TEMOVATE) 6.28 % Apply 1 application topically 2 (two) times daily as needed. For eczema.    . fluticasone (FLONASE) 50 MCG/ACT nasal spray ONE SPRAY INTO NOSE DAILY 16 g 11  . glucosamine-chondroitin 500-400 MG tablet Take 1 tablet by mouth every morning.     Marland Kitchen glucose blood (ACCU-CHEK AVIVA PLUS) test strip CHECK BLOOD SUGAR ONCE A DAY Dx 250.60 100 each 3  . glucose blood test strip Use as instructed 1  Per day  250.02    . hydrochlorothiazide (HYDRODIURIL) 25 MG tablet TAKE ONE TABLET EACH DAY 90 tablet 2  . hydrocortisone 2.5 % lotion As directed    .  levothyroxine (SYNTHROID, LEVOTHROID) 112 MCG tablet Take 1 tablet (112 mcg total) by mouth daily. 90 tablet 3  . meloxicam (MOBIC) 15 MG tablet TAKE ONE TABLET EACH DAY 90 tablet 1  . Multiple Vitamin (MULTIVITAMIN) tablet Take 1 tablet by mouth every morning.     . pantoprazole (PROTONIX) 40 MG tablet TAKE ONE TABLET EACH DAY 90 tablet 2  . PROAIR HFA 108 (90 BASE) MCG/ACT inhaler TWO PUFFS FOUR TIMES DAILY AS NEEDED 8.5 g 11  . PROCTOZONE-HC 2.5 % rectal cream APPLY TWICE DAILY RECTALLY 30 g 0   No current facility-administered medications on file prior to visit.    Allergies  Allergen Reactions  . Cefuroxime Axetil Anaphylaxis    REACTION: anaphylaxis  . Cefprozil     REACTION: unknown  . Rabeprazole Sodium Nausea Only    REACTION:  nausea    Family History  Problem Relation Age of Onset  . Cancer Mother   . Alcohol abuse Other   . Diabetes Brother     BP 136/87 mmHg  Pulse 74  Temp(Src) 98.3 F (36.8 C) (Oral)  Ht _0  (1.803 m)  Wt 262 lb (118.842 kg)  BMI 36.56 kg/m2  SpO2 96%  Review of Systems denies weight loss, blurry vision, headache, chest pain, sob, n/v, urinary frequency, muscle cramps, excessive diaphoresis, memory loss, cold intolerance, rhinorrhea, and easy bruising.  He says metformin causes diarrhea.      Objective:   Physical Exam VS: see vs page GEN: no distress HEAD: head: no deformity eyes: no periorbital swelling, no proptosis external nose and ears are normal mouth: no lesion seen NECK: a healed scar is present.  i do not appreciate a nodule in the thyroid or elsewhere in the neck CHEST WALL: no deformity LUNGS: clear to auscultation BREASTS:  bilat pseudogynecomastia.  CV: reg rate and rhythm, no murmur ABD: abdomen is soft, nontender.  no hepatosplenomegaly.  not distended.  no hernia MUSCULOSKELETAL: muscle bulk and strength are grossly normal.  no obvious joint swelling.  gait is normal and steady EXTEMITIES: no deformity.  no ulcer on the feet.  feet are of normal color and temp.  Trace bilat leg edema PULSES: dorsalis pedis intact bilat.  no carotid bruit NEURO:  cn 2-12 grossly intact.   readily moves all 4's.  sensation is intact to touch on the feet SKIN:  Normal texture and temperature.  No rash or suspicious lesion is visible.   NODES:  None palpable at the neck PSYCH: alert, well-oriented.  Does not appear anxious nor depressed.   A1c=7.6%  i personally reviewed electrocardiogram tracing (06/30/13): Indication: DM Impression: Right bundle branch block    Assessment & Plan:  DM: Needs increased rx, if it can be done with a regimen that avoids or minimizes hypoglycemia. Diarrhea, due to metformin.  We'll try a lower dosage. Obesity: new to me.    Patient  is advised the following: Patient Instructions  good diet and exercise significantly improve the control of your diabetes.  please let me know if you wish to be referred to a dietician.  high blood sugar is very risky to your health.  you should see an eye doctor and dentist every year.  It is very important to get all recommended vaccinations.  controlling your blood pressure and cholesterol drastically reduces the damage diabetes does to your body.  Those who smoke should quit.  please discuss these with your doctor.  check your blood sugar once a day.  vary  the time of day when you check, between before the 3 meals, and at bedtime.  also check if you have symptoms of your blood sugar being too high or too low.  please keep a record of the readings and bring it to your next appointment here.  You can write it on any piece of paper.  please call us sooner if your blood sugar goes below 70, or if you have a lot of readings over 200.   Please consider having weight loss surgery.  It is good for your health.  Here is some information about it.  If you decide to consider further, please call the phone number in the papers, and register for a free informational meeting.   Please reduce the metformin to 1 pill per day.   Please start taking "bromocriptine," to help your blood sugar. It has possible side effects of nausea and dizziness.  These go away with time.  You can avoid these by taking it at bedtime.   Other options are repaglinide, januvia, or invokana, which we can add if necessary.     Please come back for a follow-up appointment in 2 months.

## 2015-02-14 NOTE — Patient Instructions (Addendum)
good diet and exercise significantly improve the control of your diabetes.  please let me know if you wish to be referred to a dietician.  high blood sugar is very risky to your health.  you should see an eye doctor and dentist every year.  It is very important to get all recommended vaccinations.  controlling your blood pressure and cholesterol drastically reduces the damage diabetes does to your body.  Those who smoke should quit.  please discuss these with your doctor.  check your blood sugar once a day.  vary the time of day when you check, between before the 3 meals, and at bedtime.  also check if you have symptoms of your blood sugar being too high or too low.  please keep a record of the readings and bring it to your next appointment here.  You can write it on any piece of paper.  please call us sooner if your blood sugar goes below 70, or if you have a lot of readings over 200.   Please consider having weight loss surgery.  It is good for your health.  Here is some information about it.  If you decide to consider further, please call the phone number in the papers, and register for a free informational meeting.   Please reduce the metformin to 1 pill per day.   Please start taking "bromocriptine," to help your blood sugar. It has possible side effects of nausea and dizziness.  These go away with time.  You can avoid these by taking it at bedtime.   Other options are repaglinide, januvia, or invokana, which we can add if necessary.     Please come back for a follow-up appointment in 2 months.

## 2015-02-28 DIAGNOSIS — G4733 Obstructive sleep apnea (adult) (pediatric): Secondary | ICD-10-CM | POA: Diagnosis not present

## 2015-03-09 ENCOUNTER — Other Ambulatory Visit: Payer: Self-pay | Admitting: Internal Medicine

## 2015-03-17 ENCOUNTER — Encounter: Payer: Self-pay | Admitting: Internal Medicine

## 2015-03-17 ENCOUNTER — Ambulatory Visit (INDEPENDENT_AMBULATORY_CARE_PROVIDER_SITE_OTHER): Payer: Medicare Other | Admitting: Internal Medicine

## 2015-03-17 VITALS — BP 136/70 | HR 70 | Temp 98.8°F | Ht 71.0 in | Wt 259.0 lb

## 2015-03-17 DIAGNOSIS — I10 Essential (primary) hypertension: Secondary | ICD-10-CM | POA: Diagnosis not present

## 2015-03-17 DIAGNOSIS — Z0000 Encounter for general adult medical examination without abnormal findings: Secondary | ICD-10-CM

## 2015-03-17 DIAGNOSIS — Z0189 Encounter for other specified special examinations: Secondary | ICD-10-CM

## 2015-03-17 DIAGNOSIS — E785 Hyperlipidemia, unspecified: Secondary | ICD-10-CM | POA: Diagnosis not present

## 2015-03-17 DIAGNOSIS — Z23 Encounter for immunization: Secondary | ICD-10-CM | POA: Diagnosis not present

## 2015-03-17 DIAGNOSIS — E1340 Other specified diabetes mellitus with diabetic neuropathy, unspecified: Secondary | ICD-10-CM | POA: Diagnosis not present

## 2015-03-17 NOTE — Assessment & Plan Note (Signed)
stable overall by history and exam, recent data reviewed with pt, and pt to continue medical treatment as before,  to f/u any worsening symptoms or concerns Lab Results  Component Value Date   LDLCALC 87 12/24/2013

## 2015-03-17 NOTE — Assessment & Plan Note (Signed)
Improve overall with recent change of tx per endo, likely could use increased again oha, has f/u with endo soon and will defer for now

## 2015-03-17 NOTE — Assessment & Plan Note (Signed)
stable overall by history and exam, recent data reviewed with pt, and pt to continue medical treatment as before,  to f/u any worsening symptoms or concerns BP Readings from Last 3 Encounters:  03/17/15 136/70  02/14/15 136/87  02/10/15 132/80

## 2015-03-17 NOTE — Progress Notes (Signed)
Pre visit review using our clinic review tool, if applicable. No additional management support is needed unless otherwise documented below in the visit note. 

## 2015-03-17 NOTE — Patient Instructions (Addendum)
You had the flu shot today  Please continue all other medications as before, and refills have been done if requested.  Please have the pharmacy call with any other refills you may need.  Please continue your efforts at being more active, low cholesterol diabetic diet, and weight control.  Please keep your appointments with your specialists as you may have planned  Please return in 6 months, or sooner if needed, with Lab testing done 3-5 days before

## 2015-03-17 NOTE — Progress Notes (Signed)
Subjective:    Patient ID: Joseph Villa, male    DOB: 07/06/1940, 74 y.o.   MRN: [id number]  HPI  Here to f/u; overall doing ok,  Pt denies chest pain, increasing sob or doe, wheezing, orthopnea, PND, increased LE swelling, palpitations, dizziness or syncope.  Pt denies new neurological symptoms such as new headache, or facial or extremity weakness or numbness.  Pt denies polydipsia, polyuria, or low sugar episode.   Pt denies new neurological symptoms such as new headache, or facial or extremity weakness or numbness.   Pt states overall good compliance with meds, mostly trying to follow appropriate diet, with wt overall stable,  but little exercise however.  Did have several cbg's elevated recently, has seen Dr Rocco Pauls with office a1c check of 7.6.  Had diarrhea with increased metformin, so cut back again. but pt has f/u in November. CBG's currently ave 140-150.  Wt down about 3 lbs recent, 4 overall in past 6 mo Wt Readings from Last 3 Encounters:  03/17/15 259 lb (117.482 kg)  02/14/15 262 lb (118.842 kg)  02/10/15 263 lb (119.296 kg)   Past Medical History  Diagnosis Date  . ALLERGIC ASTHMA 11/25/2007  . ALLERGIC RHINITIS 11/25/2007  . BIPOLAR DISORDER UNSPECIFIED 10/06/2009  . COLONIC POLYPS, HX OF 10/06/2009  . DM w/o Complication Type II 02/08/3531  . GERD 10/06/2009  . HYPERLIPIDEMIA 10/06/2009  . HYPERTENSION, BENIGN 07/27/2009  . HYPOTHYROIDISM 10/06/2009  . NEPHROLITHIASIS, HX OF 10/06/2009  . OBSTRUCTIVE SLEEP APNEA 11/25/2007    cpap setting of 9  . DJD (degenerative joint disease)   . Degenerative arthritis of hip 04/18/2011  . Arthritis, lumbar spine 04/18/2011  . Hemorrhoid   . Hemorrhage yrs ago    after goiter surgery, tracheostomy inserted and later removed  . Peptic stricture of esophagus    Past Surgical History  Procedure Laterality Date  . Ganglion cyst excision    . Goiter    . Tonsillectomy    . Hernia repair  several yrs ago  . Total hip arthroplasty   06/16/2012    Procedure: TOTAL HIP ARTHROPLASTY ANTERIOR APPROACH;  Surgeon: Mauri Pole, MD;  Location: [facility];  Service: Orthopedics;  Laterality: Left;    reports that he quit smoking about 37 years ago. His smoking use included Cigarettes and Pipe. He has a 22.5 pack-year smoking history. He has never used smokeless tobacco. He reports that he does not drink alcohol or use illicit drugs. family history includes Alcohol abuse in his other; Cancer in his mother; Diabetes in his brother. Allergies  Allergen Reactions  . Cefuroxime Axetil Anaphylaxis    REACTION: anaphylaxis  . Cefprozil     REACTION: unknown  . Rabeprazole Sodium Nausea Only    REACTION: nausea   Current Outpatient Prescriptions on File Prior to Visit  Medication Sig Dispense Refill  . ACCU-CHEK AVIVA PLUS test strip CHECK BLOOD SUGAR ONCE A DAY 100 each 1  . ACCU-CHEK FASTCLIX LANCETS MISC USE TWICE DAILY 102 each 11  . ADVAIR DISKUS 250-50 MCG/DOSE AEPB INHALE ONE PUFF AND RINSE MOUTH WELL TWICE DAILY (MAINTAINANCE INHALER) 180 each 6  . BENICAR 20 MG tablet TAKE ONE TABLET EACH DAY 90 tablet 3  . Blood Glucose Monitoring Suppl (ACCU-CHEK AVIVA PLUS) W/DEVICE KIT AS DIRECTED 1 kit 0  . bromocriptine (PARLODEL) 2.5 MG tablet Take 0.5 tablets (1.25 mg total) by mouth daily. 15 tablet 11  . carbamazepine (TEGRETOL) 200 MG tablet TAKE 2 TABLETS AT BEDTIME  180 tablet 3  . Cholecalciferol (VITAMIN D3) 2000 UNITS TABS Take 2,000 Units by mouth every morning.     . clobetasol cream (TEMOVATE) 3.81 % Apply 1 application topically 2 (two) times daily as needed. For eczema.    . fluticasone (FLONASE) 50 MCG/ACT nasal spray ONE SPRAY INTO NOSE DAILY 16 g 11  . glucosamine-chondroitin 500-400 MG tablet Take 1 tablet by mouth every morning.     Marland Kitchen glucose blood test strip Use as instructed 1  Per day  250.02    . hydrochlorothiazide (HYDRODIURIL) 25 MG tablet TAKE ONE TABLET EACH DAY 90 tablet 2  . hydrocortisone 2.5 % lotion As  directed    . levothyroxine (SYNTHROID, LEVOTHROID) 112 MCG tablet Take 1 tablet (112 mcg total) by mouth daily. 90 tablet 3  . meloxicam (MOBIC) 15 MG tablet TAKE ONE TABLET EACH DAY 90 tablet 1  . metFORMIN (GLUCOPHAGE-XR) 500 MG 24 hr tablet Take 1 tablet (500 mg total) by mouth daily. 90 tablet 3  . Multiple Vitamin (MULTIVITAMIN) tablet Take 1 tablet by mouth every morning.     . pantoprazole (PROTONIX) 40 MG tablet TAKE ONE TABLET EACH DAY 90 tablet 2  . PROAIR HFA 108 (90 BASE) MCG/ACT inhaler TWO PUFFS FOUR TIMES DAILY AS NEEDED 8.5 g 11  . PROCTOZONE-HC 2.5 % rectal cream APPLY TWICE DAILY RECTALLY 30 g 0   No current facility-administered medications on file prior to visit.   Review of Systems  Constitutional: Negative for unusual diaphoresis or night sweats HENT: Negative for ringing in ear or discharge Eyes: Negative for double vision or worsening visual disturbance.  Respiratory: Negative for choking and stridor.   Gastrointestinal: Negative for vomiting or other signifcant bowel change Genitourinary: Negative for hematuria or change in urine volume.  Musculoskeletal: Negative for other MSK pain or swelling Skin: Negative for color change and worsening wound.  Neurological: Negative for tremors and numbness other than noted  Psychiatric/Behavioral: Negative for decreased concentration or agitation other than above       Objective:   Physical Exam BP 136/70 mmHg  Pulse 70  Temp(Src) 98.8 F (37.1 C) (Oral)  Ht 5' 11" (1.803 m)  Wt 259 lb (117.482 kg)  BMI 36.14 kg/m2  SpO2 95% VS noted,  Constitutional: Pt appears in no significant distress HENT: Head: NCAT.  Right Ear: External ear normal.  Left Ear: External ear normal.  Eyes: . Pupils are equal, round, and reactive to light. Conjunctivae and EOM are normal Neck: Normal range of motion. Neck supple.  Cardiovascular: Normal rate and regular rhythm.   Pulmonary/Chest: Effort normal and breath sounds without rales  or wheezing.  Abd:  Soft, NT, ND, + BS Neurological: Pt is alert. Not confused , motor grossly intact Skin: Skin is warm. No rash, no LE edema Psychiatric: Pt behavior is normal. No agitation.     Assessment & Plan:

## 2015-03-20 ENCOUNTER — Other Ambulatory Visit: Payer: Self-pay | Admitting: Internal Medicine

## 2015-03-27 ENCOUNTER — Other Ambulatory Visit: Payer: Self-pay | Admitting: Internal Medicine

## 2015-03-27 ENCOUNTER — Telehealth: Payer: Self-pay | Admitting: Internal Medicine

## 2015-03-27 NOTE — Telephone Encounter (Signed)
Spoke with pt, states that he had an asthma attack last night-had to use albuterol inhaler last night at 10:00 and 2:00.  Pt has also had a prod cough with yellow-gray mucus and clear PND X2 days.  Denies fever.  Pt has not been using his Advair regularly but has called in the refill and is picking this up today.  Pt unsure if he needs to be seen or if CY has further recs. Pt uses [facility].  CY please advise.  Thanks!   Last ov: 10/28/14 Next ov: 11/03/15  Allergies  Allergen Reactions  . Cefuroxime Axetil Anaphylaxis    REACTION: anaphylaxis  . Cefprozil     REACTION: unknown  . Rabeprazole Sodium Nausea Only    REACTION: nausea   Current Outpatient Prescriptions on File Prior to Visit  Medication Sig Dispense Refill  . ACCU-CHEK AVIVA PLUS test strip CHECK BLOOD SUGAR ONCE A DAY 100 each 1  . ACCU-CHEK FASTCLIX LANCETS MISC USE TWICE DAILY 102 each 11  . ADVAIR DISKUS 250-50 MCG/DOSE AEPB INHALE ONE PUFF AND RINSE MOUTH WELL TWICE DAILY (MAINTAINANCE INHALER) 180 each 6  . BENICAR 20 MG tablet TAKE ONE TABLET EACH DAY 90 tablet 3  . Blood Glucose Monitoring Suppl (ACCU-CHEK AVIVA PLUS) W/DEVICE KIT AS DIRECTED 1 kit 0  . bromocriptine (PARLODEL) 2.5 MG tablet Take 0.5 tablets (1.25 mg total) by mouth daily. 15 tablet 11  . carbamazepine (TEGRETOL) 200 MG tablet TAKE 2 TABLETS AT BEDTIME 180 tablet 3  . Cholecalciferol (VITAMIN D3) 2000 UNITS TABS Take 2,000 Units by mouth every morning.     . clobetasol cream (TEMOVATE) 0.31 % Apply 1 application topically 2 (two) times daily as needed. For eczema.    . fluticasone (FLONASE) 50 MCG/ACT nasal spray ONE SPRAY INTO NOSE DAILY 16 g 11  . glucosamine-chondroitin 500-400 MG tablet Take 1 tablet by mouth every morning.     Marland Kitchen glucose blood test strip Use as instructed 1  Per day  250.02    . hydrochlorothiazide (HYDRODIURIL) 25 MG tablet TAKE ONE TABLET EACH DAY 90 tablet 2  . hydrocortisone 2.5 % lotion As directed    .  levothyroxine (SYNTHROID, LEVOTHROID) 112 MCG tablet Take 1 tablet (112 mcg total) by mouth daily. 90 tablet 3  . meloxicam (MOBIC) 15 MG tablet TAKE ONE TABLET EACH DAY 90 tablet 0  . metFORMIN (GLUCOPHAGE-XR) 500 MG 24 hr tablet Take 1 tablet (500 mg total) by mouth daily. 90 tablet 3  . Multiple Vitamin (MULTIVITAMIN) tablet Take 1 tablet by mouth every morning.     . pantoprazole (PROTONIX) 40 MG tablet TAKE ONE TABLET EACH DAY 90 tablet 2  . PROAIR HFA 108 (90 BASE) MCG/ACT inhaler TWO PUFFS FOUR TIMES DAILY AS NEEDED 8.5 g 11  . PROCTOZONE-HC 2.5 % rectal cream APPLY TWICE DAILY RECTALLY 30 g 0   No current facility-administered medications on file prior to visit.

## 2015-03-27 NOTE — Telephone Encounter (Signed)
Pt is aware of recs. He verbalized understanding and needed nothing further

## 2015-03-27 NOTE — Telephone Encounter (Signed)
Resuming Advair is key. Sounds as if he has one of the viral infections going around. We can only treat for symptoms at this point. Antibiotics wouldn't help now, unless he gets worse.

## 2015-04-10 ENCOUNTER — Encounter: Payer: Self-pay | Admitting: Internal Medicine

## 2015-04-18 ENCOUNTER — Other Ambulatory Visit: Payer: Self-pay | Admitting: Internal Medicine

## 2015-04-21 ENCOUNTER — Ambulatory Visit (INDEPENDENT_AMBULATORY_CARE_PROVIDER_SITE_OTHER): Payer: Medicare Other | Admitting: Endocrinology

## 2015-04-21 ENCOUNTER — Encounter: Payer: Self-pay | Admitting: Endocrinology

## 2015-04-21 VITALS — BP 140/70 | HR 75 | Temp 98.5°F | Ht 71.0 in | Wt 262.0 lb

## 2015-04-21 DIAGNOSIS — E1340 Other specified diabetes mellitus with diabetic neuropathy, unspecified: Secondary | ICD-10-CM | POA: Diagnosis not present

## 2015-04-21 LAB — POCT GLYCOSYLATED HEMOGLOBIN (HGB A1C): Hemoglobin A1C: 7.2

## 2015-04-21 MED ORDER — BROMOCRIPTINE MESYLATE 2.5 MG PO TABS: 2.5000 mg | ORAL_TABLET | Freq: Every day | ORAL | Status: DC

## 2015-04-21 NOTE — Patient Instructions (Addendum)
Please stop taking the metformin, and:  Increase the bromocriptine to a whole pill per day.  Please call if the sugar does not come down to the low-100's, so we can add "januvia." check your blood sugar once a day.  vary the time of day when you check, between before the 3 meals, and at bedtime.  also check if you have symptoms of your blood sugar being too high or too low.  please keep a record of the readings and bring it to your next appointment here (or you can bring the meter itself).  You can write it on any piece of paper.  please call us sooner if your blood sugar goes below 70, or if you have a lot of readings over 200. Please come back for a follow-up appointment in 3 months.

## 2015-04-21 NOTE — Progress Notes (Signed)
Subjective:    Patient ID: Joseph Villa, male    DOB: 1940-06-25, 74 y.o.   MRN: [id number]  HPI Pt returns for f/u of diabetes mellitus: DM type: 2 Dx'ed: 4010 Complications: poltneuropathy Therapy: 2 oral meds DKA: never Severe hypoglycemia: never Pancreatitis: never Other: he has never been on insulin Interval history: he brings a record of his cbg's which i have reviewed today.  All are in the mid-100's.  Past Medical History  Diagnosis Date  . ALLERGIC ASTHMA 11/25/2007  . ALLERGIC RHINITIS 11/25/2007  . BIPOLAR DISORDER UNSPECIFIED 10/06/2009  . COLONIC POLYPS, HX OF 10/06/2009  . DM w/o Complication Type II 27/07/5364  . GERD 10/06/2009  . HYPERLIPIDEMIA 10/06/2009  . HYPERTENSION, BENIGN 07/27/2009  . HYPOTHYROIDISM 10/06/2009  . NEPHROLITHIASIS, HX OF 10/06/2009  . OBSTRUCTIVE SLEEP APNEA 11/25/2007    cpap setting of 9  . DJD (degenerative joint disease)   . Degenerative arthritis of hip 04/18/2011  . Arthritis, lumbar spine 04/18/2011  . Hemorrhoid   . Hemorrhage yrs ago    after goiter surgery, tracheostomy inserted and later removed  . Peptic stricture of esophagus     Past Surgical History  Procedure Laterality Date  . Ganglion cyst excision    . Goiter    . Tonsillectomy    . Hernia repair  several yrs ago  . Total hip arthroplasty  06/16/2012    Procedure: TOTAL HIP ARTHROPLASTY ANTERIOR APPROACH;  Surgeon: Mauri Pole, MD;  Location: [facility];  Service: Orthopedics;  Laterality: Left;    Social History   Social History  . Marital Status: Married    Spouse Name: N/A  . Number of Children: N/A  . Years of Education: N/A   Occupational History  . Not on file.   Social History Main Topics  . Smoking status: Former Smoker -- 1.50 packs/day for 15 years    Types: Cigarettes, Pipe    Quit date: 06/23/1977  . Smokeless tobacco: Never Used  . Alcohol Use: No  . Drug Use: No  . Sexual Activity: Not on file   Other Topics Concern  . Not on file    Social History Narrative    Current Outpatient Prescriptions on File Prior to Visit  Medication Sig Dispense Refill  . ACCU-CHEK AVIVA PLUS test strip CHECK BLOOD SUGAR ONCE A DAY 100 each 1  . ACCU-CHEK FASTCLIX LANCETS MISC USE TWICE DAILY 102 each 11  . ADVAIR DISKUS 250-50 MCG/DOSE AEPB INHALE 1 PUFF AND RINSE MOUTH TWICE DAILY. MAINTENANCE INHALER 180 each 1  . BENICAR 20 MG tablet TAKE ONE TABLET EACH DAY 90 tablet 3  . Blood Glucose Monitoring Suppl (ACCU-CHEK AVIVA PLUS) W/DEVICE KIT AS DIRECTED 1 kit 0  . carbamazepine (TEGRETOL) 200 MG tablet TAKE 2 TABLETS AT BEDTIME 180 tablet 3  . Cholecalciferol (VITAMIN D3) 2000 UNITS TABS Take 2,000 Units by mouth every morning.     . clobetasol cream (TEMOVATE) 4.40 % Apply 1 application topically 2 (two) times daily as needed. For eczema.    . fluticasone (FLONASE) 50 MCG/ACT nasal spray ONE SPRAY INTO NOSE EVERY DAY 48 g 1  . glucosamine-chondroitin 500-400 MG tablet Take 1 tablet by mouth every morning.     Marland Kitchen glucose blood test strip Use as instructed 1  Per day  250.02    . hydrochlorothiazide (HYDRODIURIL) 25 MG tablet TAKE ONE TABLET EVERY DAY 90 tablet 1  . hydrocortisone 2.5 % lotion As directed    . levothyroxine (  SYNTHROID, LEVOTHROID) 112 MCG tablet Take 1 tablet (112 mcg total) by mouth daily. 90 tablet 3  . meloxicam (MOBIC) 15 MG tablet TAKE ONE TABLET EACH DAY 90 tablet 0  . Multiple Vitamin (MULTIVITAMIN) tablet Take 1 tablet by mouth every morning.     . pantoprazole (PROTONIX) 40 MG tablet TAKE ONE TABLET EVERY DAY 90 tablet 1  . PROAIR HFA 108 (90 BASE) MCG/ACT inhaler 2 PUFFS FOUR TIMES DAILY AS NEEDED 8.5 g 0  . PROCTOZONE-HC 2.5 % rectal cream APPLY TWICE DAILY RECTALLY 30 g 0   No current facility-administered medications on file prior to visit.    Allergies  Allergen Reactions  . Cefuroxime Axetil Anaphylaxis    REACTION: anaphylaxis  . Cefprozil     REACTION: unknown  . Rabeprazole Sodium Nausea Only     REACTION: nausea    Family History  Problem Relation Age of Onset  . Cancer Mother   . Alcohol abuse Other   . Diabetes Brother     BP 140/70 mmHg  Pulse 75  Temp(Src) 98.5 F (36.9 C) (Oral)  Ht _0  (1.803 m)  Wt 262 lb (118.842 kg)  BMI 36.56 kg/m2  SpO2 98%  Review of Systems He has persistent diarrhea, despite taking metformin-XR, just 500 mg qd.  No weight change.      Objective:   Physical Exam VITAL SIGNS:  See vs page GENERAL: no distress Pulses: dorsalis pedis intact bilat.   MSK: no deformity of the feet CV: trace bilat leg edema.  Skin:  no ulcer on the feet.  normal color and temp on the feet.  Neuro: sensation is intact to touch on the feet, but decreased from normal.    A1c=7.2%    Assessment & Plan:  Diarrhea, persistent, due to metformin.   DM: he needs increased rx, if it can be done with a regimen that avoids or minimizes hypoglycemia. he declines to add another med today.    Patient is advised the following: Patient Instructions  Please stop taking the metformin, and:  Increase the bromocriptine to a whole pill per day.  Please call if the sugar does not come down to the low-100's, so we can add "januvia." check your blood sugar once a day.  vary the time of day when you check, between before the 3 meals, and at bedtime.  also check if you have symptoms of your blood sugar being too high or too low.  please keep a record of the readings and bring it to your next appointment here (or you can bring the meter itself).  You can write it on any piece of paper.  please call us sooner if your blood sugar goes below 70, or if you have a lot of readings over 200. Please come back for a follow-up appointment in 3 months.

## 2015-05-12 ENCOUNTER — Encounter: Payer: Self-pay | Admitting: Internal Medicine

## 2015-06-01 ENCOUNTER — Ambulatory Visit (INDEPENDENT_AMBULATORY_CARE_PROVIDER_SITE_OTHER): Payer: Medicare Other | Admitting: Internal Medicine

## 2015-06-01 ENCOUNTER — Encounter: Payer: Self-pay | Admitting: Internal Medicine

## 2015-06-01 VITALS — BP 130/60 | HR 96 | Ht 69.5 in | Wt 260.2 lb

## 2015-06-01 DIAGNOSIS — K219 Gastro-esophageal reflux disease without esophagitis: Secondary | ICD-10-CM

## 2015-06-01 DIAGNOSIS — R1310 Dysphagia, unspecified: Secondary | ICD-10-CM | POA: Diagnosis not present

## 2015-06-01 DIAGNOSIS — K222 Esophageal obstruction: Secondary | ICD-10-CM

## 2015-06-01 DIAGNOSIS — Z1211 Encounter for screening for malignant neoplasm of colon: Secondary | ICD-10-CM

## 2015-06-01 MED ORDER — NA SULFATE-K SULFATE-MG SULF 17.5-3.13-1.6 GM/177ML PO SOLN: 1.0000 | Freq: Once | ORAL | Status: DC

## 2015-06-01 NOTE — Progress Notes (Signed)
HISTORY OF PRESENT ILLNESS:  Joseph Villa is a 74 y.o. male with multiple medical problems as listed below. He is sent to this office today by his primary care provider, Dr. Jenny Villa, regarding the need for surveillance colonoscopy and upper endoscopy. Outside records have been reviewed, and prior endoscopy reports, as well as recent relevant laboratories and x-rays. The patient last underwent colonoscopy 12/21/2003 with Dr. Jim Villa. He was found to have internal hemorrhoids, polyp of the cecum, and prominent ileocecal valve which was biopsied. The ileocecal valve biopsy revealed nonspecific reactive changes with minimal inflammation. The cecal polyp was hyperplastic. He also underwent upper endoscopy at that time. There was a question of Barrett's esophagus. However, biopsies did not reveal Barrett's esophagus. The patient subsequently saw Dr. Verl Villa and underwent upper endoscopy in February 2006. At that time the patient was complaining of dysphagia and reflux symptoms. There was no evidence for Barrett's esophagus. Esophagus was dilated with 58 French Maloney dilator. The patient has remained on chronic daily PPI therapy. Off medication significant reflux symptoms. He does have rare intermittent solid food dysphagia which has not changed in frequency over time. He does not feel that he needs repeat esophageal dilation. He takes oral agents for diabetes. He is significantly obese. He does have occasional diarrhea with dietary indiscretion to item such as lettuce or medication such as metformin.  REVIEW OF SYSTEMS:  All non-GI ROS negative except for arthritis, back pain, itching, shortness of breath  Past Medical History  Diagnosis Date  . ALLERGIC ASTHMA 11/25/2007  . ALLERGIC RHINITIS 11/25/2007  . BIPOLAR DISORDER UNSPECIFIED 10/06/2009  . COLONIC POLYPS, HX OF 10/06/2009  . DM w/o Complication Type II 123456  . GERD 10/06/2009  . HYPERLIPIDEMIA 10/06/2009  . HYPERTENSION, BENIGN 07/27/2009   . HYPOTHYROIDISM 10/06/2009  . NEPHROLITHIASIS, HX OF 10/06/2009  . OBSTRUCTIVE SLEEP APNEA 11/25/2007    cpap setting of 9  . DJD (degenerative joint disease)   . Degenerative arthritis of hip 04/18/2011  . Arthritis, lumbar spine 04/18/2011  . Hemorrhoid   . Hemorrhage yrs ago    after goiter surgery, tracheostomy inserted and later removed  . Peptic stricture of esophagus     Past Surgical History  Procedure Laterality Date  . Ganglion cyst excision    . Thyroid goiter surgery    . Tonsillectomy    . Umbilical hernia repair  several yrs ago  . Total hip arthroplasty  06/16/2012    Procedure: TOTAL HIP ARTHROPLASTY ANTERIOR APPROACH;  Surgeon: Joseph Pole, MD;  Location: [facility];  Service: Orthopedics;  Laterality: Left;    Social History Joseph Villa  reports that he quit smoking about 37 years ago. His smoking use included Cigarettes and Pipe. He has a 22.5 pack-year smoking history. He has never used smokeless tobacco. He reports that he does not drink alcohol or use illicit drugs.  family history includes Alcohol abuse in his brother; Diabetes in his brother; Drug abuse in his daughter; Throat cancer in his mother.  Allergies  Allergen Reactions  . Cefuroxime Axetil Anaphylaxis    REACTION: anaphylaxis  . Cefprozil     REACTION: unknown  . Rabeprazole Sodium Nausea Only    REACTION: nausea       PHYSICAL EXAMINATION: Vital signs: BP 130/60 mmHg  Pulse 96  Ht 5' 9.5" (1.765 m)  Wt 260 lb 4 oz (118.049 kg)  BMI 37.89 kg/m2  Constitutional: Pleasant, obese, generally well-appearing, no acute distress Psychiatric: alert and oriented  x3, cooperative Eyes: extraocular movements intact, anicteric, conjunctiva pink Mouth: oral pharynx moist, no lesions Neck: supple without thyromegaly. Large transverse incision from prior thyroid surgery Lymph: No lymphadenopathy Cardiovascular: heart regular rate and rhythm, no murmur Lungs: clear to auscultation  bilaterally Abdomen: soft, obese, nontender, nondistended, no obvious ascites, no peritoneal signs, normal bowel sounds, no organomegaly Rectal: To be performed a colonoscopy Extremities: no clubbing cyanosis or lower extremity edema bilaterally Skin: no lesions on visible extremities Neuro: No focal deficits. Intact deep tendon reflexes  ASSESSMENT:  #1. Chronic GERD without Barrett's esophagus #2. History of esophageal stricture with dilation 2006. Minimal dysphagia since #3. Last colonoscopy 2005 without adenomatous polyp. Due for routine repeat screening as 10 years has passed. He is motivated #4. Multiple medical problems including morbid obesity and diabetes  PLAN:  #1. Reflux precautions with attention to weight loss. Discussed #2. Continue PPI therapy to control GERD symptoms and reduce the risk of worsening esophageal stricture that would require dilation #3. Contact the office if dysphagia were to worsen #4. Schedule repeat screening colonoscopy. The patient is high-risk given his body habitus. #5. Advised to hold diabetic medications the day of the procedure #6. Ongoing general medical care with Dr. Jenny Villa  A copy of this consultation note has been sent to Dr. Jenny Villa

## 2015-06-01 NOTE — Patient Instructions (Signed)
You have been scheduled for a colonoscopy. Please follow written instructions given to you at your visit today.  Please pick up your prep supplies at the pharmacy within the next 1-3 days. If you use inhalers (even only as needed), please bring them with you on the day of your procedure.   

## 2015-06-05 ENCOUNTER — Other Ambulatory Visit: Payer: Self-pay | Admitting: Internal Medicine

## 2015-06-06 ENCOUNTER — Other Ambulatory Visit: Payer: Self-pay | Admitting: Internal Medicine

## 2015-06-07 ENCOUNTER — Telehealth: Payer: Self-pay

## 2015-06-07 MED ORDER — SITAGLIPTIN PHOSPHATE 100 MG PO TABS: 100.0000 mg | ORAL_TABLET | Freq: Every day | ORAL | Status: DC

## 2015-06-07 NOTE — Telephone Encounter (Signed)
i have sent a prescription to your pharmacy, to add "januvia." i'll see you next time.

## 2015-06-07 NOTE — Telephone Encounter (Signed)
Pt came to the office today to report blood sugar readings. Pt stated his sugar reading have been raninging between 182 and 202. Pt did not stop the metformin as advised in the recent office note. Pt has been taking metformin 500 mg and bromocriptine 2.5 mg. Pt is requesting the januvia to be sent so he can D/C the metformin. Please advise, Thanks!

## 2015-06-08 ENCOUNTER — Other Ambulatory Visit: Payer: Self-pay

## 2015-06-08 ENCOUNTER — Other Ambulatory Visit: Payer: Self-pay | Admitting: Internal Medicine

## 2015-06-08 MED ORDER — SITAGLIPTIN PHOSPHATE 100 MG PO TABS: 100.0000 mg | ORAL_TABLET | Freq: Every day | ORAL | Status: DC

## 2015-06-08 NOTE — Telephone Encounter (Signed)
I contacted the pt and advise of note below. Pt voiced understanding.

## 2015-06-16 ENCOUNTER — Telehealth: Payer: Self-pay | Admitting: Internal Medicine

## 2015-06-16 NOTE — Telephone Encounter (Signed)
Left message on voicemail.

## 2015-06-19 ENCOUNTER — Other Ambulatory Visit: Payer: Self-pay | Admitting: Internal Medicine

## 2015-06-23 NOTE — Telephone Encounter (Signed)
Patient wanted to know if Dr. Henrene Pastor could "use a laser" while doing his colonoscopy to get rid of his hemorrhoid.  I explained to him that different hemorrhoids were treated different ways (banding vs. Surgery) but could not be treated during his colonoscopy; I told him to mention his concerns when speaking to Dr. Henrene Pastor before the procedure.  Patient agreed

## 2015-07-05 ENCOUNTER — Encounter: Payer: Medicare Other | Admitting: Internal Medicine

## 2015-07-05 ENCOUNTER — Telehealth: Payer: Self-pay | Admitting: Endocrinology

## 2015-07-05 NOTE — Telephone Encounter (Signed)
please call patient: i received cbg record It is a little high Please move up appt to next available.

## 2015-07-05 NOTE — Telephone Encounter (Signed)
Left a voicemail requested a call back from the pt to schedule his appointment.

## 2015-07-12 ENCOUNTER — Encounter: Payer: Self-pay | Admitting: Endocrinology

## 2015-07-12 ENCOUNTER — Ambulatory Visit (INDEPENDENT_AMBULATORY_CARE_PROVIDER_SITE_OTHER): Payer: Medicare Other | Admitting: Endocrinology

## 2015-07-12 ENCOUNTER — Other Ambulatory Visit: Payer: Self-pay

## 2015-07-12 VITALS — BP 153/64 | HR 77 | Temp 98.1°F | Ht 69.0 in | Wt 263.0 lb

## 2015-07-12 DIAGNOSIS — E1340 Other specified diabetes mellitus with diabetic neuropathy, unspecified: Secondary | ICD-10-CM | POA: Diagnosis not present

## 2015-07-12 LAB — POCT GLYCOSYLATED HEMOGLOBIN (HGB A1C): Hemoglobin A1C: 7.6

## 2015-07-12 MED ORDER — GLUCOSE BLOOD VI STRP: ORAL_STRIP | Status: AC

## 2015-07-12 MED ORDER — REPAGLINIDE 0.5 MG PO TABS: 0.5000 mg | ORAL_TABLET | Freq: Three times a day (TID) | ORAL | Status: DC

## 2015-07-12 NOTE — Patient Instructions (Addendum)
i have sent a prescription to your pharmacy, to add "repaglinide." check your blood sugar once a day.  vary the time of day when you check, between before the 3 meals, and at bedtime.  also check if you have symptoms of your blood sugar being too high or too low.  please keep a record of the readings and bring it to your next appointment here (or you can bring the meter itself).  You can write it on any piece of paper.  please call us sooner if your blood sugar goes below 70, or if you have a lot of readings over 200. Please come back for a follow-up appointment in 3 months.

## 2015-07-12 NOTE — Progress Notes (Signed)
Subjective:    Patient ID: Joseph Villa, male    DOB: Dec 28, 1940, 75 y.o.   MRN: [id number]  HPI Pt returns for f/u of diabetes mellitus: DM type: 2 Dx'ed: 2595 Complications: polyneuropathy and renal insufficiency. Therapy: 2 oral meds DKA: never Severe hypoglycemia: never.  Pancreatitis: never.  Other: he has never been on insulin, but he has learned how; he did not tolerate metformin-XR (diarrhea); he cannot take pioglitizone, due to edema; renal insufficiency also limits oral rx options.  Interval history: he brings a record of his cbg's which i have reviewed today.  It varies from the mid-100's to the 200's.  pt states he feels well in general.  Past Medical History  Diagnosis Date  . ALLERGIC ASTHMA 11/25/2007  . ALLERGIC RHINITIS 11/25/2007  . BIPOLAR DISORDER UNSPECIFIED 10/06/2009  . COLONIC POLYPS, HX OF 10/06/2009  . DM w/o Complication Type II 63/01/7563  . GERD 10/06/2009  . HYPERLIPIDEMIA 10/06/2009  . HYPERTENSION, BENIGN 07/27/2009  . HYPOTHYROIDISM 10/06/2009  . NEPHROLITHIASIS, HX OF 10/06/2009  . OBSTRUCTIVE SLEEP APNEA 11/25/2007    cpap setting of 9  . DJD (degenerative joint disease)   . Degenerative arthritis of hip 04/18/2011  . Arthritis, lumbar spine 04/18/2011  . Hemorrhoid   . Hemorrhage yrs ago    after goiter surgery, tracheostomy inserted and later removed  . Peptic stricture of esophagus     Past Surgical History  Procedure Laterality Date  . Ganglion cyst excision    . Thyroid goiter surgery    . Tonsillectomy    . Umbilical hernia repair  several yrs ago  . Total hip arthroplasty  06/16/2012    Procedure: TOTAL HIP ARTHROPLASTY ANTERIOR APPROACH;  Surgeon: Mauri Pole, MD;  Location: [facility];  Service: Orthopedics;  Laterality: Left;    Social History   Social History  . Marital Status: Married    Spouse Name: N/A  . Number of Children: 3  . Years of Education: N/A   Occupational History  . retired    Social History Main Topics  .  Smoking status: Former Smoker -- 1.50 packs/day for 15 years    Types: Cigarettes, Pipe    Quit date: 06/23/1977  . Smokeless tobacco: Never Used  . Alcohol Use: No  . Drug Use: No  . Sexual Activity: Not on file   Other Topics Concern  . Not on file   Social History Narrative    Current Outpatient Prescriptions on File Prior to Visit  Medication Sig Dispense Refill  . ACCU-CHEK AVIVA PLUS test strip CHECK BLOOD SUGAR ONCE A DAY 100 each 1  . ACCU-CHEK FASTCLIX LANCETS MISC USE TWICE DAILY 102 each 1  . ADVAIR DISKUS 250-50 MCG/DOSE AEPB INHALE 1 PUFF AND RINSE MOUTH TWICE DAILY. MAINTENANCE INHALER 180 each 1  . BENICAR 20 MG tablet TAKE ONE TABLET EACH DAY 90 tablet 1  . Blood Glucose Monitoring Suppl (ACCU-CHEK AVIVA PLUS) W/DEVICE KIT AS DIRECTED 1 kit 0  . bromocriptine (PARLODEL) 2.5 MG tablet Take 1 tablet (2.5 mg total) by mouth daily. 90 tablet 3  . carbamazepine (TEGRETOL) 200 MG tablet TAKE 2 TABLETS AT BEDTIME 180 tablet 1  . Cholecalciferol (VITAMIN D3) 2000 UNITS TABS Take 2,000 Units by mouth every morning.     . citalopram (CELEXA) 10 MG tablet Take 10 mg by mouth daily.    . clobetasol cream (TEMOVATE) 3.32 % Apply 1 application topically 2 (two) times daily as needed. For eczema.    Marland Kitchen  fluticasone (FLONASE) 50 MCG/ACT nasal spray ONE SPRAY INTO NOSE EVERY DAY 48 g 1  . glucosamine-chondroitin 500-400 MG tablet Take 1 tablet by mouth every morning.     . hydrochlorothiazide (HYDRODIURIL) 25 MG tablet TAKE ONE TABLET EVERY DAY 90 tablet 1  . hydrocortisone 2.5 % lotion As directed    . levothyroxine (SYNTHROID, LEVOTHROID) 112 MCG tablet Take 1 tablet (112 mcg total) by mouth daily. 90 tablet 3  . meloxicam (MOBIC) 15 MG tablet TAKE ONE TABLET EACH DAY 90 tablet 0  . Na Sulfate-K Sulfate-Mg Sulf SOLN Take 1 kit by mouth once. 354 mL 0  . pantoprazole (PROTONIX) 40 MG tablet TAKE ONE TABLET EVERY DAY 90 tablet 1  . PROAIR HFA 108 (90 BASE) MCG/ACT inhaler 2 PUFFS FOUR  TIMES DAILY AS NEEDED 8.5 g 0  . PROCTOZONE-HC 2.5 % rectal cream APPLY TWICE DAILY RECTALLY 30 g 0  . sitaGLIPtin (JANUVIA) 100 MG tablet Take 1 tablet (100 mg total) by mouth daily. 90 tablet 2   No current facility-administered medications on file prior to visit.    Allergies  Allergen Reactions  . Cefuroxime Axetil Anaphylaxis    REACTION: anaphylaxis  . Cefprozil     REACTION: unknown  . Rabeprazole Sodium Nausea Only    REACTION: nausea    Family History  Problem Relation Age of Onset  . Throat cancer Mother   . Alcohol abuse Brother   . Diabetes Brother   . Drug abuse Daughter   . Alcoholism      uncle    BP 153/64 mmHg  Pulse 77  Temp(Src) 98.1 F (36.7 C) (Oral)  Ht 5' 9" (1.753 m)  Wt 263 lb (119.296 kg)  BMI 38.82 kg/m2  SpO2 94%   Review of Systems He denies hypoglycemia     Objective:   Physical Exam VITAL SIGNS:  See vs page GENERAL: no distress Pulses: dorsalis pedis intact bilat.   MSK: no deformity of the feet CV: trace bilat leg edema.  Skin:  no ulcer on the feet.  normal color and temp on the feet.  Neuro: sensation is intact to touch on the feet, but decreased from normal.    Lab Results  Component Value Date   HGBA1C 7.6 07/12/2015      Assessment & Plan:  DM: glycemic control is slightly worse.  Edema: this limits oral rx options.  Renal insufficiency: this also limits oral rx options.     Patient is advised the following: Patient Instructions  i have sent a prescription to your pharmacy, to add "repaglinide." check your blood sugar once a day.  vary the time of day when you check, between before the 3 meals, and at bedtime.  also check if you have symptoms of your blood sugar being too high or too low.  please keep a record of the readings and bring it to your next appointment here (or you can bring the meter itself).  You can write it on any piece of paper.  please call us sooner if your blood sugar goes below 70, or if you have  a lot of readings over 200. Please come back for a follow-up appointment in 3 months.

## 2015-07-25 ENCOUNTER — Encounter: Payer: Medicare Other | Attending: Endocrinology | Admitting: Nutrition

## 2015-07-26 ENCOUNTER — Telehealth: Payer: Self-pay | Admitting: Nutrition

## 2015-08-15 NOTE — Telephone Encounter (Signed)
Opened in error

## 2015-08-28 DIAGNOSIS — G4733 Obstructive sleep apnea (adult) (pediatric): Secondary | ICD-10-CM | POA: Diagnosis not present

## 2015-09-01 ENCOUNTER — Ambulatory Visit: Payer: Medicare Other | Admitting: Endocrinology

## 2015-09-19 ENCOUNTER — Other Ambulatory Visit: Payer: Self-pay | Admitting: Internal Medicine

## 2015-09-29 ENCOUNTER — Encounter: Payer: Medicare Other | Admitting: Internal Medicine

## 2015-10-06 ENCOUNTER — Ambulatory Visit: Payer: Medicare Other | Admitting: Endocrinology

## 2015-10-06 ENCOUNTER — Ambulatory Visit (INDEPENDENT_AMBULATORY_CARE_PROVIDER_SITE_OTHER): Payer: Medicare Other | Admitting: Endocrinology

## 2015-10-06 ENCOUNTER — Encounter: Payer: Self-pay | Admitting: Endocrinology

## 2015-10-06 VITALS — BP 154/84 | HR 80 | Temp 98.0°F | Resp 16 | Ht 69.0 in | Wt 267.0 lb

## 2015-10-06 DIAGNOSIS — E1122 Type 2 diabetes mellitus with diabetic chronic kidney disease: Secondary | ICD-10-CM

## 2015-10-06 DIAGNOSIS — E1340 Other specified diabetes mellitus with diabetic neuropathy, unspecified: Secondary | ICD-10-CM

## 2015-10-06 DIAGNOSIS — N183 Chronic kidney disease, stage 3 (moderate): Secondary | ICD-10-CM | POA: Diagnosis not present

## 2015-10-06 LAB — POCT GLYCOSYLATED HEMOGLOBIN (HGB A1C): HEMOGLOBIN A1C: 7.8

## 2015-10-06 MED ORDER — NATEGLINIDE 60 MG PO TABS: 60.0000 mg | ORAL_TABLET | Freq: Three times a day (TID) | ORAL | Status: DC

## 2015-10-06 MED ORDER — BROMOCRIPTINE MESYLATE 2.5 MG PO TABS: 1.2500 mg | ORAL_TABLET | Freq: Every day | ORAL | Status: DC

## 2015-10-06 NOTE — Patient Instructions (Addendum)
i have sent a prescription to your pharmacy, to add "nateglinide." Please reduce the bromocriptine to 1/2 pill every night.   check your blood sugar once a day.  vary the time of day when you check, between before the 3 meals, and at bedtime.  also check if you have symptoms of your blood sugar being too high or too low.  please keep a record of the readings and bring it to your next appointment here (or you can bring the meter itself).  You can write it on any piece of paper.  please call us sooner if your blood sugar goes below 70, or if you have a lot of readings over 200. Please come back for a follow-up appointment in 3 months.

## 2015-10-06 NOTE — Progress Notes (Signed)
Subjective:    Patient ID: Joseph Villa, male    DOB: Mar 26, 1941, 75 y.o.   MRN: [id number]  HPI Pt returns for f/u of diabetes mellitus: DM type: 2 Dx'ed: 5638 Complications: polyneuropathy and renal insufficiency. Therapy: 3 oral meds DKA: never Severe hypoglycemia: never.  Pancreatitis: never.  Other: he has never been on insulin, but he has learned how; he did not tolerate metformin-XR (diarrhea); he cannot take pioglitizone, due to edema; renal insufficiency also limits oral rx options.  Interval history: He has slight dizziness sensation in the head, but no assoc nausea.  He stopped prandin, due to constipation.  He says cbg's are in the mid-100's.   Past Medical History  Diagnosis Date  . ALLERGIC ASTHMA 11/25/2007  . ALLERGIC RHINITIS 11/25/2007  . BIPOLAR DISORDER UNSPECIFIED 10/06/2009  . COLONIC POLYPS, HX OF 10/06/2009  . DM w/o Complication Type II 93/12/3426  . GERD 10/06/2009  . HYPERLIPIDEMIA 10/06/2009  . HYPERTENSION, BENIGN 07/27/2009  . HYPOTHYROIDISM 10/06/2009  . NEPHROLITHIASIS, HX OF 10/06/2009  . OBSTRUCTIVE SLEEP APNEA 11/25/2007    cpap setting of 9  . DJD (degenerative joint disease)   . Degenerative arthritis of hip 04/18/2011  . Arthritis, lumbar spine 04/18/2011  . Hemorrhoid   . Hemorrhage yrs ago    after goiter surgery, tracheostomy inserted and later removed  . Peptic stricture of esophagus     Past Surgical History  Procedure Laterality Date  . Ganglion cyst excision    . Thyroid goiter surgery    . Tonsillectomy    . Umbilical hernia repair  several yrs ago  . Total hip arthroplasty  06/16/2012    Procedure: TOTAL HIP ARTHROPLASTY ANTERIOR APPROACH;  Surgeon: Mauri Pole, MD;  Location: [facility];  Service: Orthopedics;  Laterality: Left;    Social History   Social History  . Marital Status: Married    Spouse Name: N/A  . Number of Children: 3  . Years of Education: N/A   Occupational History  . retired    Social History Main Topics    . Smoking status: Former Smoker -- 1.50 packs/day for 15 years    Types: Cigarettes, Pipe    Quit date: 06/23/1977  . Smokeless tobacco: Never Used  . Alcohol Use: No  . Drug Use: No  . Sexual Activity: Not on file   Other Topics Concern  . Not on file   Social History Narrative    Current Outpatient Prescriptions on File Prior to Visit  Medication Sig Dispense Refill  . ACCU-CHEK AVIVA PLUS test strip CHECK BLOOD SUGAR ONCE A DAY 100 each 1  . ACCU-CHEK FASTCLIX LANCETS MISC USE TWICE DAILY 102 each 1  . ADVAIR DISKUS 250-50 MCG/DOSE AEPB INHALE 1 PUFF AND RINSE MOUTH TWICE DAILY. MAINTENANCE INHALER 180 each 1  . BENICAR 20 MG tablet TAKE ONE TABLET EACH DAY 90 tablet 1  . Blood Glucose Monitoring Suppl (ACCU-CHEK AVIVA PLUS) W/DEVICE KIT AS DIRECTED 1 kit 0  . carbamazepine (TEGRETOL) 200 MG tablet TAKE 2 TABLETS AT BEDTIME 180 tablet 1  . Cholecalciferol (VITAMIN D3) 2000 UNITS TABS Take 2,000 Units by mouth every morning.     . citalopram (CELEXA) 10 MG tablet Take 10 mg by mouth daily.    . clobetasol cream (TEMOVATE) 7.68 % Apply 1 application topically 2 (two) times daily as needed. For eczema.    . fluticasone (FLONASE) 50 MCG/ACT nasal spray ONE SPRAY INTO NOSE EVERY DAY 48 g 1  .  glucosamine-chondroitin 500-400 MG tablet Take 1 tablet by mouth every morning.     Marland Kitchen glucose blood test strip Use to check blood sugar 2 times per day. DX Code: E11.9 200 each 2  . hydrochlorothiazide (HYDRODIURIL) 25 MG tablet TAKE ONE TABLET EVERY DAY 90 tablet 1  . hydrocortisone 2.5 % lotion As directed    . levothyroxine (SYNTHROID, LEVOTHROID) 112 MCG tablet TAKE ONE TABLET EACH DAY 90 tablet 0  . meloxicam (MOBIC) 15 MG tablet TAKE ONE TABLET EACH DAY 90 tablet 0  . Na Sulfate-K Sulfate-Mg Sulf SOLN Take 1 kit by mouth once. 354 mL 0  . pantoprazole (PROTONIX) 40 MG tablet TAKE ONE TABLET EVERY DAY 90 tablet 1  . PROAIR HFA 108 (90 BASE) MCG/ACT inhaler 2 PUFFS FOUR TIMES DAILY AS  NEEDED 8.5 g 0  . PROCTOZONE-HC 2.5 % rectal cream APPLY TWICE DAILY RECTALLY 30 g 0  . sitaGLIPtin (JANUVIA) 100 MG tablet Take 1 tablet (100 mg total) by mouth daily. 90 tablet 2   No current facility-administered medications on file prior to visit.    Allergies  Allergen Reactions  . Cefuroxime Axetil Anaphylaxis    REACTION: anaphylaxis  . Cefprozil     REACTION: unknown  . Rabeprazole Sodium Nausea Only    REACTION: nausea    Family History  Problem Relation Age of Onset  . Throat cancer Mother   . Alcohol abuse Brother   . Diabetes Brother   . Drug abuse Daughter   . Alcoholism      uncle    BP 154/84 mmHg  Pulse 80  Temp(Src) 98 F (36.7 C) (Oral)  Resp 16  Ht '5\' 9"'  (1.753 m)  Wt 267 lb (121.11 kg)  BMI 39.41 kg/m2  SpO2 96%  Review of Systems Denies LOC and weight change.      Objective:   Physical Exam VITAL SIGNS:  See vs page GENERAL: no distress Pulses: dorsalis pedis intact bilat.   MSK: no deformity of the feet CV: trace bilat leg edema Skin:  no ulcer on the feet.  normal color and temp on the feet. Neuro: sensation is intact to touch on the feet, but decreased from normal.     A1c=7.8%     Assessment & Plan:  Dizziness, new, possible caused or exac by parlodel. DM: he needs increased rx, if it can be done with a regimen that avoids or minimizes hypoglycemia.  Edema, persistent: this limits rx options.   Patient is advised the following: Patient Instructions  i have sent a prescription to your pharmacy, to add "nateglinide." Please reduce the bromocriptine to 1/2 pill every night.   check your blood sugar once a day.  vary the time of day when you check, between before the 3 meals, and at bedtime.  also check if you have symptoms of your blood sugar being too high or too low.  please keep a record of the readings and bring it to your next appointment here (or you can bring the meter itself).  You can write it on any piece of paper.  please  call us sooner if your blood sugar goes below 70, or if you have a lot of readings over 200. Please come back for a follow-up appointment in 3 months.

## 2015-10-06 NOTE — Progress Notes (Signed)
Pre visit review using our clinic review tool, if applicable. No additional management support is needed unless otherwise documented below in the visit note. 

## 2015-10-07 DIAGNOSIS — E119 Type 2 diabetes mellitus without complications: Secondary | ICD-10-CM | POA: Insufficient documentation

## 2015-10-16 ENCOUNTER — Other Ambulatory Visit: Payer: Self-pay | Admitting: Internal Medicine

## 2015-10-19 ENCOUNTER — Other Ambulatory Visit: Payer: Self-pay | Admitting: Endocrinology

## 2015-10-20 ENCOUNTER — Other Ambulatory Visit: Payer: Self-pay | Admitting: Internal Medicine

## 2015-10-20 ENCOUNTER — Other Ambulatory Visit (INDEPENDENT_AMBULATORY_CARE_PROVIDER_SITE_OTHER): Payer: Medicare Other

## 2015-10-20 ENCOUNTER — Ambulatory Visit (INDEPENDENT_AMBULATORY_CARE_PROVIDER_SITE_OTHER): Payer: Medicare Other | Admitting: Internal Medicine

## 2015-10-20 VITALS — BP 142/82 | HR 90 | Temp 97.7°F | Resp 20 | Wt 260.0 lb

## 2015-10-20 DIAGNOSIS — E785 Hyperlipidemia, unspecified: Secondary | ICD-10-CM

## 2015-10-20 DIAGNOSIS — N138 Other obstructive and reflux uropathy: Secondary | ICD-10-CM | POA: Insufficient documentation

## 2015-10-20 DIAGNOSIS — Z0001 Encounter for general adult medical examination with abnormal findings: Secondary | ICD-10-CM | POA: Diagnosis not present

## 2015-10-20 DIAGNOSIS — N182 Chronic kidney disease, stage 2 (mild): Secondary | ICD-10-CM

## 2015-10-20 DIAGNOSIS — Z0000 Encounter for general adult medical examination without abnormal findings: Secondary | ICD-10-CM

## 2015-10-20 DIAGNOSIS — E1122 Type 2 diabetes mellitus with diabetic chronic kidney disease: Secondary | ICD-10-CM

## 2015-10-20 DIAGNOSIS — R6889 Other general symptoms and signs: Secondary | ICD-10-CM | POA: Diagnosis not present

## 2015-10-20 DIAGNOSIS — R7989 Other specified abnormal findings of blood chemistry: Secondary | ICD-10-CM

## 2015-10-20 DIAGNOSIS — I10 Essential (primary) hypertension: Secondary | ICD-10-CM

## 2015-10-20 DIAGNOSIS — N401 Enlarged prostate with lower urinary tract symptoms: Secondary | ICD-10-CM | POA: Insufficient documentation

## 2015-10-20 DIAGNOSIS — E039 Hypothyroidism, unspecified: Secondary | ICD-10-CM

## 2015-10-20 LAB — CBC WITH DIFFERENTIAL/PLATELET
BASOS PCT: 0.4 % (ref 0.0–3.0)
Basophils Absolute: 0 10*3/uL (ref 0.0–0.1)
EOS PCT: 4 % (ref 0.0–5.0)
Eosinophils Absolute: 0.3 10*3/uL (ref 0.0–0.7)
HCT: 42.1 % (ref 39.0–52.0)
HEMOGLOBIN: 14.2 g/dL (ref 13.0–17.0)
LYMPHS ABS: 1.6 10*3/uL (ref 0.7–4.0)
Lymphocytes Relative: 24.9 % (ref 12.0–46.0)
MCHC: 33.8 g/dL (ref 30.0–36.0)
MCV: 88.4 fl (ref 78.0–100.0)
MONO ABS: 0.6 10*3/uL (ref 0.1–1.0)
Monocytes Relative: 9.2 % (ref 3.0–12.0)
Neutro Abs: 4 10*3/uL (ref 1.4–7.7)
Neutrophils Relative %: 61.5 % (ref 43.0–77.0)
Platelets: 200 10*3/uL (ref 150.0–400.0)
RBC: 4.76 Mil/uL (ref 4.22–5.81)
RDW: 13.1 % (ref 11.5–15.5)
WBC: 6.4 10*3/uL (ref 4.0–10.5)

## 2015-10-20 LAB — HEPATIC FUNCTION PANEL
ALK PHOS: 63 U/L (ref 39–117)
ALT: 40 U/L (ref 0–53)
AST: 28 U/L (ref 0–37)
Albumin: 4.5 g/dL (ref 3.5–5.2)
BILIRUBIN TOTAL: 0.4 mg/dL (ref 0.2–1.2)
Bilirubin, Direct: 0.1 mg/dL (ref 0.0–0.3)
Total Protein: 6.9 g/dL (ref 6.0–8.3)

## 2015-10-20 LAB — URINALYSIS, ROUTINE W REFLEX MICROSCOPIC
BILIRUBIN URINE: NEGATIVE
HGB URINE DIPSTICK: NEGATIVE
Ketones, ur: NEGATIVE
Leukocytes, UA: NEGATIVE
NITRITE: NEGATIVE
PH: 5 (ref 5.0–8.0)
RBC / HPF: NONE SEEN (ref 0–?)
Specific Gravity, Urine: 1.02 (ref 1.000–1.030)
Total Protein, Urine: NEGATIVE
Urine Glucose: NEGATIVE
Urobilinogen, UA: 0.2 (ref 0.0–1.0)
WBC, UA: NONE SEEN (ref 0–?)

## 2015-10-20 LAB — LIPID PANEL
CHOL/HDL RATIO: 6
CHOLESTEROL: 223 mg/dL — AB (ref 0–200)
HDL: 38.1 mg/dL — ABNORMAL LOW (ref >39.00)
NonHDL: 185.23
Triglycerides: 209 mg/dL — ABNORMAL HIGH (ref 0.0–149.0)
VLDL: 41.8 mg/dL — ABNORMAL HIGH (ref 0.0–40.0)

## 2015-10-20 LAB — BASIC METABOLIC PANEL
BUN: 41 mg/dL — AB (ref 6–23)
CHLORIDE: 105 meq/L (ref 96–112)
CO2: 25 mEq/L (ref 19–32)
Calcium: 9.6 mg/dL (ref 8.4–10.5)
Creatinine, Ser: 1.54 mg/dL — ABNORMAL HIGH (ref 0.40–1.50)
GFR: 47.36 mL/min — ABNORMAL LOW (ref >60.00)
Glucose, Bld: 133 mg/dL — ABNORMAL HIGH (ref 70–99)
POTASSIUM: 4.2 meq/L (ref 3.5–5.1)
Sodium: 139 mEq/L (ref 135–145)

## 2015-10-20 LAB — HEMOGLOBIN A1C: HEMOGLOBIN A1C: 8.1 % — AB (ref 4.6–6.5)

## 2015-10-20 LAB — MICROALBUMIN / CREATININE URINE RATIO
CREATININE, U: 176 mg/dL
MICROALB UR: 2.9 mg/dL — AB (ref 0.0–1.9)
Microalb Creat Ratio: 1.6 mg/g (ref 0.0–30.0)

## 2015-10-20 LAB — TSH: TSH: 3.95 u[IU]/mL (ref 0.35–4.50)

## 2015-10-20 LAB — LDL CHOLESTEROL, DIRECT: LDL DIRECT: 148 mg/dL

## 2015-10-20 LAB — PSA: PSA: 0.44 ng/mL (ref 0.10–4.00)

## 2015-10-20 MED ORDER — ATORVASTATIN CALCIUM 10 MG PO TABS: 10.0000 mg | ORAL_TABLET | Freq: Every day | ORAL | Status: DC

## 2015-10-20 MED ORDER — TAMSULOSIN HCL 0.4 MG PO CAPS: 0.4000 mg | ORAL_CAPSULE | Freq: Every day | ORAL | Status: DC

## 2015-10-20 MED ORDER — OLMESARTAN MEDOXOMIL 40 MG PO TABS: 40.0000 mg | ORAL_TABLET | Freq: Every day | ORAL | Status: DC

## 2015-10-20 NOTE — Progress Notes (Signed)
Subjective:    Patient ID: Joseph Villa, male    DOB: 18-Jul-1940, 75 y.o.   MRN: [id number]  HPI  Here for wellness and f/u;  Overall doing ok;  Pt denies Chest pain, worsening SOB, DOE, wheezing, orthopnea, PND, worsening LE edema, palpitations, dizziness or syncope.  Pt denies neurological change such as new headache, facial or extremity weakness.  Pt denies polydipsia, polyuria, or low sugar symptoms. Pt states overall good compliance with treatment and medications, good tolerability, and has been trying to follow appropriate diet.  Pt denies worsening depressive symptoms, suicidal ideation or panic. No fever, night sweats, wt loss, loss of appetite, or other constitutional symptoms.  Pt states good ability with ADL's, has low fall risk, home safety reviewed and adequate, no other significant changes in hearing or vision, and only occasionally active with exercise. Still sees Dr Rocco Pauls with recent a1c 7.8.  OHA decresae due to some dizziness with taking, No falls. Has hx of bipolar, and wife assistive for many yrs and lets him know if he gets over energized, has done well recently with the tegretol, helps with sleep as well.  Due for colonoscopy, was scheduled in feb, but he did not want to chance the cold air to make his asthma worse, so not done. Has lost some wt with better diet recently. Wt Readings from Last 3 Encounters:  10/20/15 260 lb (117.935 kg)  10/06/15 267 lb (121.11 kg)  07/12/15 263 lb (119.296 kg)   Past Medical History  Diagnosis Date  . ALLERGIC ASTHMA 11/25/2007  . ALLERGIC RHINITIS 11/25/2007  . BIPOLAR DISORDER UNSPECIFIED 10/06/2009  . COLONIC POLYPS, HX OF 10/06/2009  . DM w/o Complication Type II 22/09/8248  . GERD 10/06/2009  . HYPERLIPIDEMIA 10/06/2009  . HYPERTENSION, BENIGN 07/27/2009  . HYPOTHYROIDISM 10/06/2009  . NEPHROLITHIASIS, HX OF 10/06/2009  . OBSTRUCTIVE SLEEP APNEA 11/25/2007    cpap setting of 9  . DJD (degenerative joint disease)   . Degenerative  arthritis of hip 04/18/2011  . Arthritis, lumbar spine 04/18/2011  . Hemorrhoid   . Hemorrhage yrs ago    after goiter surgery, tracheostomy inserted and later removed  . Peptic stricture of esophagus    Past Surgical History  Procedure Laterality Date  . Ganglion cyst excision    . Thyroid goiter surgery    . Tonsillectomy    . Umbilical hernia repair  several yrs ago  . Total hip arthroplasty  06/16/2012    Procedure: TOTAL HIP ARTHROPLASTY ANTERIOR APPROACH;  Surgeon: Mauri Pole, MD;  Location: [facility];  Service: Orthopedics;  Laterality: Left;    reports that he quit smoking about 38 years ago. His smoking use included Cigarettes and Pipe. He has a 22.5 pack-year smoking history. He has never used smokeless tobacco. He reports that he does not drink alcohol or use illicit drugs. family history includes Alcohol abuse in his brother; Diabetes in his brother; Drug abuse in his daughter; Throat cancer in his mother. Allergies  Allergen Reactions  . Cefuroxime Axetil Anaphylaxis    REACTION: anaphylaxis  . Cefprozil     REACTION: unknown  . Rabeprazole Sodium Nausea Only    REACTION: nausea   Current Outpatient Prescriptions on File Prior to Visit  Medication Sig Dispense Refill  . ACCU-CHEK AVIVA PLUS test strip CHECK BLOOD SUGAR ONCE A DAY 100 each 1  . ACCU-CHEK FASTCLIX LANCETS MISC USE TWICE DAILY 102 each 2  . ADVAIR DISKUS 250-50 MCG/DOSE AEPB INHALE 1 PUFF AND  RINSE MOUTH TWICE DAILY. MAINTENANCE INHALER 180 each 1  . Blood Glucose Monitoring Suppl (ACCU-CHEK AVIVA PLUS) W/DEVICE KIT AS DIRECTED 1 kit 0  . bromocriptine (PARLODEL) 2.5 MG tablet Take 0.5 tablets (1.25 mg total) by mouth daily. 45 tablet 3  . carbamazepine (TEGRETOL) 200 MG tablet TAKE 2 TABLETS AT BEDTIME 180 tablet 1  . Cholecalciferol (VITAMIN D3) 2000 UNITS TABS Take 2,000 Units by mouth every morning.     . citalopram (CELEXA) 10 MG tablet Take 10 mg by mouth daily.    . clobetasol cream (TEMOVATE)  9.56 % Apply 1 application topically 2 (two) times daily as needed. For eczema.    . fluticasone (FLONASE) 50 MCG/ACT nasal spray ONE SPRAY INTO NOSE EVERY DAY 48 g 1  . glucosamine-chondroitin 500-400 MG tablet Take 1 tablet by mouth every morning.     Marland Kitchen glucose blood test strip Use to check blood sugar 2 times per day. DX Code: E11.9 200 each 2  . hydrochlorothiazide (HYDRODIURIL) 25 MG tablet TAKE ONE TABLET EACH DAY 90 tablet 1  . hydrocortisone 2.5 % lotion As directed    . levothyroxine (SYNTHROID, LEVOTHROID) 112 MCG tablet TAKE ONE TABLET EACH DAY 90 tablet 0  . meloxicam (MOBIC) 15 MG tablet TAKE ONE TABLET EACH DAY 90 tablet 0  . Na Sulfate-K Sulfate-Mg Sulf SOLN Take 1 kit by mouth once. 354 mL 0  . nateglinide (STARLIX) 60 MG tablet Take 1 tablet (60 mg total) by mouth 3 (three) times daily with meals. 90 tablet 11  . pantoprazole (PROTONIX) 40 MG tablet TAKE ONE TABLET EACH DAY 90 tablet 1  . PROAIR HFA 108 (90 BASE) MCG/ACT inhaler 2 PUFFS FOUR TIMES DAILY AS NEEDED 8.5 g 0  . PROCTOZONE-HC 2.5 % rectal cream APPLY TWICE DAILY RECTALLY 30 g 0  . sitaGLIPtin (JANUVIA) 100 MG tablet Take 1 tablet (100 mg total) by mouth daily. 90 tablet 2   No current facility-administered medications on file prior to visit.   Review of Systems Constitutional: Negative for increased diaphoresis, or other activity, appetite or siginficant weight change other than noted HENT: Negative for worsening hearing loss, ear pain, facial swelling, mouth sores and neck stiffness.   Eyes: Negative for other worsening pain, redness or visual disturbance.  Respiratory: Negative for choking or stridor Cardiovascular: Negative for other chest pain and palpitations.  Gastrointestinal: Negative for worsening diarrhea, blood in stool, or abdominal distention Genitourinary: Negative for hematuria, flank pain or change in urine volume.  Musculoskeletal: Negative for myalgias or other joint complaints.  Skin: Negative  for other color change and wound or drainage.  Neurological: Negative for syncope and numbness. other than noted Hematological: Negative for adenopathy. or other swelling Psychiatric/Behavioral: Negative for hallucinations, SI, self-injury, decreased concentration or other worsening agitation.      Objective:   Physical Exam BP 142/82 mmHg  Pulse 90  Temp(Src) 97.7 F (36.5 C) (Oral)  Resp 20  Wt 260 lb (117.935 kg)  SpO2 97% VS noted,  Constitutional: Pt is oriented to person, place, and time. Appears well-developed and well-nourished, in no significant distress Head: Normocephalic and atraumatic  Eyes: Conjunctivae and EOM are normal. Pupils are equal, round, and reactive to light Right Ear: External ear normal.  Left Ear: External ear normal Nose: Nose normal.  Mouth/Throat: Oropharynx is clear and moist  Neck: Normal range of motion. Neck supple. No JVD present. No tracheal deviation present or significant neck LA or mass Cardiovascular: Normal rate, regular rhythm, normal  heart sounds and intact distal pulses.   Pulmonary/Chest: Effort normal and breath sounds without rales or wheezing  Abdominal: Soft. Bowel sounds are normal. NT. No HSM  Musculoskeletal: Normal range of motion. Exhibits no edema Lymphadenopathy: Has no cervical adenopathy.  Neurological: Pt is alert and oriented to person, place, and time. Pt has normal reflexes. No cranial nerve deficit. Motor grossly intact Skin: Skin is warm and dry. No rash noted or new ulcers Psychiatric:  Has normal mood and affect. Behavior is normal.   ECG today: Sinus  Rhythm  -Right bundle branch block.   ABNORMAL     Assessment & Plan:

## 2015-10-20 NOTE — Progress Notes (Signed)
Pre visit review using our clinic review tool, if applicable. No additional management support is needed unless otherwise documented below in the visit note. 

## 2015-10-20 NOTE — Patient Instructions (Addendum)
Ok to increase the benicar to 40 mg per day, as well as the Flomax for the prostate (either in the Am or PM once per day)  Please continue all other medications as before, and refills have been done if requested.  Please have the pharmacy call with any other refills you may need.  Please continue your efforts at being more active, low cholesterol diet, and weight control.  You are otherwise up to date with prevention measures today.  Please keep your appointments with your specialists as you may have planned  You will be contacted regarding the referral for: colonoscopy  Please go to the LAB in the Basement (turn left off the elevator) for the tests to be done today  You will be contacted by phone if any changes need to be made immediately.  Otherwise, you will receive a letter about your results with an explanation, but please check with MyChart first.  Please remember to sign up for MyChart if you have not done so, as this will be important to you in the future with finding out test results, communicating by private email, and scheduling acute appointments online when needed.  Please return in 6 months, or sooner if needed

## 2015-10-21 NOTE — Assessment & Plan Note (Signed)
stable overall by history and exam, recent data reviewed with pt, and pt to continue medical treatment as before,  to f/u any worsening symptoms or concerns Lab Results  Component Value Date   TSH 3.95 10/20/2015

## 2015-10-21 NOTE — Assessment & Plan Note (Signed)

## 2015-10-21 NOTE — Assessment & Plan Note (Addendum)
Mild uncontrolled, for increased benicar to 40 mg, o/w stable overall by history and exam, recent data reviewed with pt, and pt to continue medical treatment as before,  to f/u any worsening symptoms or concerns BP Readings from Last 3 Encounters:  10/20/15 142/82  10/06/15 154/84  07/12/15 153/64

## 2015-10-21 NOTE — Assessment & Plan Note (Signed)
stable overall by history and exam, recent data reviewed with pt, and pt to continue medical treatment as before,  to f/u any worsening symptoms or concerns Lab Results  Component Value Date   LDLCALC 87 12/24/2013

## 2015-10-21 NOTE — Assessment & Plan Note (Signed)
stable overall by history and exam, recent data reviewed with pt, and pt to continue medical treatment as before,  to f/u any worsening symptoms or concerns, also for f/u psa Lab Results  Component Value Date   PSA 0.44 10/20/2015   PSA 0.33 09/06/2014   PSA 0.30 12/24/2013

## 2015-10-21 NOTE — Assessment & Plan Note (Signed)
stable overall by history and exam, recent data reviewed with pt, and pt to continue medical treatment as before,  to f/u any worsening symptoms or concerns Lab Results  Component Value Date   HGBA1C 8.1* 10/20/2015

## 2015-10-25 ENCOUNTER — Encounter: Payer: Self-pay | Admitting: Internal Medicine

## 2015-11-03 ENCOUNTER — Encounter: Payer: Self-pay | Admitting: Internal Medicine

## 2015-11-03 ENCOUNTER — Ambulatory Visit (INDEPENDENT_AMBULATORY_CARE_PROVIDER_SITE_OTHER): Payer: Medicare Other | Admitting: Internal Medicine

## 2015-11-03 VITALS — BP 124/76 | HR 74 | Ht 71.5 in | Wt 259.4 lb

## 2015-11-03 DIAGNOSIS — G4733 Obstructive sleep apnea (adult) (pediatric): Secondary | ICD-10-CM | POA: Diagnosis not present

## 2015-11-03 DIAGNOSIS — J45998 Other asthma: Secondary | ICD-10-CM

## 2015-11-03 NOTE — Patient Instructions (Addendum)
We can continue CPAP 10/ [facility]  Suggest going to bed a little earlier and allowing yourself a short nap on CPAP when needed

## 2015-11-03 NOTE — Progress Notes (Signed)
04/23/11- 70 yoM former smoker, followed for allergic rhinitis, asthma, complicated by DM, Bipolar, GERD LOV-04/24/2010 Has had flu vaccine and TDAP He has done well over the past year, off of allergy vaccine Continues CPAP at 9 CWP for his sleep apnea/[facility]. Asthma causes no problems unless he is in a very dusty environment and even then it is mild. He is no longer using Advair. Rarely uses rescue inhaler. Does use Flonase for rhinitis.  11/15/11- 70 yoM former smoker, followed for allergic rhinitis, asthma, complicated by DM, Bipolar, GERD Sleep apnea control remains good at 9 CWP/ [facility]. Uses chin strap. Still gets dry mouth if lying on his back. Dyspnea with exertion is stable and chronic. No cough. 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 CXR 05/09/11- reviewed with him IMPRESSION:  1. Some interval increase in linear lingular scarring or  atelectasis.  Original Report Authenticated By: Trecia Rogers, M.D.    05/22/12- 71 yoM former smoker, followed for allergic rhinitis, asthma, complicated by DM, Bipolar, GERD FOLLOWS FOR: wears CPAP every night from 1130pm to 12am to 6-7am; still having episodes of waking up and feels like something is going to happen(anxiety) having more stress. Needs surgery clearance for hip surgery CPAP 9/[facility] with good compliance and control. He gets some dry mouth and we discussed the humidifier. He will take his own mask to the hospital her surgery and they will supply the machine. He had been going to a gym regularly 3 times a week for many years, recently limited by his hip pain. Little cough with no wheeze or phlegm. Still notices dyspnea on exertion, probably unchanged over 8-10 years. He says he is short of breath walking one block on level ground but he had been able to exercise hard at the gym. CXR 11/14/11-reviewed and compared with earlier images IMPRESSION:  Chronic left basilar  atelectasis versus scarring.  Mild chronic bronchitic and interstitial lung disease changes.  Original Report Authenticated By: Burnetta Sabin, M.D.  Office Spirometry 05/22/12-normal spirometry. FVC 3.85/83%, FEV1 2.88/81%, FEV1/FVC 0.75, FEF 25-75% 2.17/69%.  08/17/12- 71 yoM former smoker, followed for allergic rhinitis, asthma, complicated by DM, Bipolar, GERD ACUTE VISIT: cough-"gray colored mucus with green sinus" x 5 days; started zpak 3 days ago. Cough gray, nasal green. Eye itches,  But no fever, HA, malaise, blood.  Grandson visited, bringing a cold. Using Afrin- discussed. Had hip replacement and is concerned about potential joint infection if respiratory infection lingers.  Doing fine with CPAP 9/ [facility].  CXR 06/02/12-  IMPRESSION:  Stable exam. Basilar atelectasis versus scarring.  Original Report Authenticated By: Jerilynn Mages. Shick, M.D.  01/14/13- 30 yoM former smoker, followed for allergic rhinitis, asthma, complicated by DM, Bipolar, GERD FOLLOWS FOR: SOB when wokring out(unable to get deep breath). only able to work out 2 times a week now.  Doing fine with CPAP 9/ [facility], but says he would like to try 10. Feels very well in chest without cough or wheeze. Exercising less.  07/13/13- 36 yoM former smoker, followed for allergic rhinitis, asthma, complicated by DM, Bipolar, GERD ACUTE VISIT: Wife was sick around [place] got it from her. Stayed sick all of January as well; had a period of 5 days that his lungs "ached".  Was seen by PCP Dr Jenny Reichmann Friday 07-09-13; was told PCP felt like he could still have PNA. He denies fever or discolored sputum but asks Z pak to hold. CPAP 10/ [facility]- doing  well and pressure ok.  CXR 07/09/13 IMPRESSION:  No active cardiopulmonary disease.  Electronically Signed  By: Logan Bores  On: 07/09/2013 12:07  01/17/14-72 yoM former smoker, followed for allergic rhinitis, asthma, complicated by DM, Bipolar, GERD Follows For: Wears cpap 10/ [facility] 8 hrs/night  - Mask fits well - Shoulder pain wakes pt up at night  10/28/14- 73 yoM former smoker, followed for allergic rhinitis, asthma, OSA, complicated by DM, Bipolar, GERD Follows For: Wears CPAP 10/ [facility] 5-7 hours nightly, nasal pillows mask fits fine. No new complaints. Download requested.  Chin strap sometimes doesn't hold Flonase does pretty well for allergic rhinitis. Has pet bird Marty Heck that perches on him and is going to stay. Asthma control usually good unless he gets in a very dusty environment. He notices some dyspnea with exertion, not changed. He continues to exercise regularly with weights but avoids "cardio" after hip replacement, as instructed.  11/03/2015-75 year old male former smoker followed for Allergic rhinitis, Asthma, OSA,  complicated by DM, bipolar, GERD CPAP 10/[facility] FOLLOWS FOR: DME is [facility].Pt wears CPAP every night. DL attached. Pt has questions/concerns about Benicar. Pt does not feel he is sleeping as well as he should and has issues with headgear. Reports breathing better since BP med was changed from ACE inhibitor to ARB/Benicar. He will have his PCP manage this. States he is only sleeping about 4 hours because he goes to bed late and then bladder gets him up early in the morning. His download actually indicates an average sleep time between 6 and 8 hours. He does tend to doze off during the day in his recliner if sitting quietly but his download confirms excellent control and compliance.  ROS-see HPI Constitutional:   No-   weight loss, night sweats, fevers, chills, fatigue, lassitude. HEENT:   No-  headaches, difficulty swallowing, tooth/dental problems, sore throat,       No-  Sneezing,no- itching, ear ache, +nasal congestion, +post nasal drip,  CV:  No-   chest pain, orthopnea, PND, swelling in lower extremities, anasarca, dizziness, palpitations Resp: +-shortness of breath with exertion or at rest.              No-productive cough,  No non-productive cough,   No- coughing up of blood.              No-change in color of mucus.  No- wheezing.   Skin: No-   rash or lesions. GI:  No-   heartburn, indigestion, abdominal pain, nausea, vomiting,  GU: . MS:  No-   joint pain or swelling.  Neuro-     nothing unusual Psych:  No- change in mood or affect. No depression or anxiety.  No memory loss.   OBJ- General- Alert, Oriented, Affect-appropriate, Distress- none acute, mild hoarseness. + Overweight Skin- rash-none, lesions- none, excoriation- none Lymphadenopathy- none Head- atraumatic            Eyes- Gross vision intact, PERRLA, conjunctivae clear secretions            Ears- +Hearing aids/ HOH            Nose- Clear, no-Septal dev, mucus, polyps, erosion, perforation             Throat- Mallampati IV , mucosa clear , drainage- none, tonsils present Neck- flexible , trachea midline, no stridor , thyroid nl, carotid no bruit Chest - symmetrical excursion , unlabored           Heart/CV- RRR , no murmur , no  gallop  , no rub, nl s1 s2                           - JVD- none , edema- none, stasis changes- none, varices- none           Lung- clear to P&A, wheeze- none, cough-none , dullness-none, rub- none           Chest wall-  Abd-  Br/ Gen/ Rectal- Not done, not indicated Extrem- cyanosis- none, clubbing, none, atrophy- none, strength- nl Neuro- grossly intact to observation

## 2015-11-03 NOTE — Assessment & Plan Note (Signed)
His CPAP download looks very good. He notices daytime sleepiness occasionally but admits he sometimes stays up until midnight or 2 AM reading. We discussed good sleep hygiene, reasonable expectations and ability to take a short deliberate nap occasionally with his CPAP.

## 2015-11-03 NOTE — Assessment & Plan Note (Signed)
Unlabored breathing without reported exacerbation or need for routine inhalers

## 2015-11-10 ENCOUNTER — Encounter: Payer: Self-pay | Admitting: Internal Medicine

## 2015-11-24 ENCOUNTER — Other Ambulatory Visit: Payer: Self-pay | Admitting: Internal Medicine

## 2015-12-06 ENCOUNTER — Ambulatory Visit (AMBULATORY_SURGERY_CENTER): Payer: Self-pay | Admitting: *Deleted

## 2015-12-06 VITALS — Ht 70.5 in | Wt 258.0 lb

## 2015-12-06 DIAGNOSIS — Z1211 Encounter for screening for malignant neoplasm of colon: Secondary | ICD-10-CM

## 2015-12-06 NOTE — Progress Notes (Signed)
No egg or soy allergy known to patient  No issues with past sedation with any surgeries  or procedures, no intubation problems  No diet pills per patient No home 02 use per patient  No blood thinners per patient  Pt denies issues with constipation  Wife in PV today with Pt

## 2015-12-11 DIAGNOSIS — H0015 Chalazion left lower eyelid: Secondary | ICD-10-CM | POA: Diagnosis not present

## 2015-12-11 DIAGNOSIS — E119 Type 2 diabetes mellitus without complications: Secondary | ICD-10-CM | POA: Diagnosis not present

## 2015-12-11 DIAGNOSIS — H52203 Unspecified astigmatism, bilateral: Secondary | ICD-10-CM | POA: Diagnosis not present

## 2015-12-11 LAB — HM DIABETES EYE EXAM

## 2015-12-15 DIAGNOSIS — L245 Irritant contact dermatitis due to other chemical products: Secondary | ICD-10-CM | POA: Diagnosis not present

## 2015-12-15 DIAGNOSIS — L820 Inflamed seborrheic keratosis: Secondary | ICD-10-CM | POA: Diagnosis not present

## 2015-12-15 DIAGNOSIS — L821 Other seborrheic keratosis: Secondary | ICD-10-CM | POA: Diagnosis not present

## 2015-12-15 DIAGNOSIS — Z85828 Personal history of other malignant neoplasm of skin: Secondary | ICD-10-CM | POA: Diagnosis not present

## 2015-12-16 ENCOUNTER — Other Ambulatory Visit: Payer: Self-pay | Admitting: Internal Medicine

## 2015-12-20 ENCOUNTER — Encounter: Payer: Self-pay | Admitting: Internal Medicine

## 2015-12-20 ENCOUNTER — Ambulatory Visit (AMBULATORY_SURGERY_CENTER): Payer: Medicare Other | Admitting: Internal Medicine

## 2015-12-20 VITALS — BP 128/36 | HR 66 | Temp 97.8°F | Resp 15 | Ht 70.0 in | Wt 258.0 lb

## 2015-12-20 DIAGNOSIS — Z1211 Encounter for screening for malignant neoplasm of colon: Secondary | ICD-10-CM

## 2015-12-20 DIAGNOSIS — I10 Essential (primary) hypertension: Secondary | ICD-10-CM | POA: Diagnosis not present

## 2015-12-20 DIAGNOSIS — D123 Benign neoplasm of transverse colon: Secondary | ICD-10-CM | POA: Diagnosis not present

## 2015-12-20 LAB — GLUCOSE, CAPILLARY
Glucose-Capillary: 129 mg/dL — ABNORMAL HIGH (ref 65–99)
Glucose-Capillary: 131 mg/dL — ABNORMAL HIGH (ref 65–99)

## 2015-12-20 MED ORDER — SODIUM CHLORIDE 0.9 % IV SOLN: 500.0000 mL | INTRAVENOUS | Status: DC

## 2015-12-20 NOTE — Progress Notes (Signed)
Called to room to assist during endoscopic procedure.  Patient ID and intended procedure confirmed with present staff. Received instructions for my participation in the procedure from the performing physician.  

## 2015-12-20 NOTE — Progress Notes (Signed)
Report to PACU, RN, vss, BBS= Clear.  

## 2015-12-20 NOTE — Op Note (Signed)
[facility] Patient Name: Edil Rufus Procedure Date: 12/20/2015 9:40 AM MRN: [id number] Endoscopist: Docia Chuck. Henrene Pastor , MD Age: 75 Referring MD:  Date of Birth: 02/22/1941 Gender: Male Account #: [id number] Procedure:                Colonoscopy, with cold snare polypectomy x 1 Indications:              Screening for colorectal malignant neoplasm.                            Previous examination 2005 with Dr. Lajoyce Corners was negative Medicines:                Monitored Anesthesia Care Procedure:                Pre-Anesthesia Assessment:                           - Prior to the procedure, a History and Physical                            was performed, and patient medications and                            allergies were reviewed. The patient's tolerance of                            previous anesthesia was also reviewed. The risks                            and benefits of the procedure and the sedation                            options and risks were discussed with the patient.                            All questions were answered, and informed consent                            was obtained. Prior Anticoagulants: The patient has                            taken no previous anticoagulant or antiplatelet                            agents. ASA Grade Assessment: II - A patient with                            mild systemic disease. After reviewing the risks                            and benefits, the patient was deemed in                            satisfactory condition to undergo the procedure.  After obtaining informed consent, the colonoscope                            was passed under direct vision. Throughout the                            procedure, the patient's blood pressure, pulse, and                            oxygen saturations were monitored continuously. The                            Model CF-HQ190L [id number] scope was introduced                    through the anus and advanced to the the cecum,                            identified by appendiceal orifice and ileocecal                            valve. The ileocecal valve, appendiceal orifice,                            and rectum were photographed. The quality of the                            bowel preparation was excellent. The colonoscopy                            was performed without difficulty. The patient                            tolerated the procedure well. The bowel preparation                            used was SUPREP. Scope In: 10:20:53 AM Scope Out: 10:34:11 AM Scope Withdrawal Time: 0 hours 11 minutes 24 seconds  Total Procedure Duration: 0 hours 13 minutes 18 seconds  Findings:                 A 4 mm polyp was found in the proximal transverse                            colon. The polyp was removed with a cold snare.                            Resection and retrieval were complete.                           The exam was otherwise without abnormality on                            direct and retroflexion views. Complications:            No immediate  complications. Estimated blood loss:                            None. Estimated Blood Loss:     Estimated blood loss: none. Impression:               - One 4 mm polyp in the proximal transverse colon,                            removed with a cold snare. Resected and retrieved.                           - The examination was otherwise normal on direct                            and retroflexion views. Recommendation:           - Repeat colonoscopy in 5-prn years for                            surveillance.                           - Patient has a contact number available for                            emergencies. The signs and symptoms of potential                            delayed complications were discussed with the                            patient. Return to normal activities tomorrow.                             Written discharge instructions were provided to the                            patient.                           - Resume previous diet.                           - Continue present medications.                           - Await pathology results. Docia Chuck. Henrene Pastor, MD 12/20/2015 10:40:20 AM This report has been signed electronically.

## 2015-12-20 NOTE — Patient Instructions (Signed)
Impressions/recommendations:  Polyp (handout given)  YOU HAD AN ENDOSCOPIC PROCEDURE TODAY AT [facility]:   Refer to the procedure report that was given to you for any specific questions about what was found during the examination.  If the procedure report does not answer your questions, please call your gastroenterologist to clarify.  If you requested that your care partner not be given the details of your procedure findings, then the procedure report has been included in a sealed envelope for you to review at your convenience later.  YOU SHOULD EXPECT: Some feelings of bloating in the abdomen. Passage of more gas than usual.  Walking can help get rid of the air that was put into your GI tract during the procedure and reduce the bloating. If you had a lower endoscopy (such as a colonoscopy or flexible sigmoidoscopy) you may notice spotting of blood in your stool or on the toilet paper. If you underwent a bowel prep for your procedure, you may not have a normal bowel movement for a few days.  Please Note:  You might notice some irritation and congestion in your nose or some drainage.  This is from the oxygen used during your procedure.  There is no need for concern and it should clear up in a day or so.  SYMPTOMS TO REPORT IMMEDIATELY:   Following lower endoscopy (colonoscopy or flexible sigmoidoscopy):  Excessive amounts of blood in the stool  Significant tenderness or worsening of abdominal pains  Swelling of the abdomen that is new, acute  Fever of 100F or higher  For urgent or emergent issues, a gastroenterologist can be reached at any hour by calling [phone].   DIET: Your first meal following the procedure should be a small meal and then it is ok to progress to your normal diet. Heavy or fried foods are harder to digest and may make you feel nauseous or bloated.  Likewise, meals heavy in dairy and vegetables can increase bloating.  Drink plenty of fluids but you  should avoid alcoholic beverages for 24 hours.  ACTIVITY:  You should plan to take it easy for the rest of today and you should NOT DRIVE or use heavy machinery until tomorrow (because of the sedation medicines used during the test).    FOLLOW UP: Our staff will call the number listed on your records the next business day following your procedure to check on you and address any questions or concerns that you may have regarding the information given to you following your procedure. If we do not reach you, we will leave a message.  However, if you are feeling well and you are not experiencing any problems, there is no need to return our call.  We will assume that you have returned to your regular daily activities without incident.  If any biopsies were taken you will be contacted by phone or by letter within the next 1-3 weeks.  Please call us at [phone] if you have not heard about the biopsies in 3 weeks.    SIGNATURES/CONFIDENTIALITY: You and/or your care partner have signed paperwork which will be entered into your electronic medical record.  These signatures attest to the fact that that the information above on your After Visit Summary has been reviewed and is understood.  Full responsibility of the confidentiality of this discharge information lies with you and/or your care-partner.

## 2015-12-21 ENCOUNTER — Telehealth: Payer: Self-pay | Admitting: *Deleted

## 2015-12-21 NOTE — Telephone Encounter (Signed)
  Follow up Call-  Call back number 12/20/2015  Post procedure Call Back phone  # [phone]  Permission to leave phone message Yes     Patient questions:  Do you have a fever, pain , or abdominal swelling? No. Pain Score  0 *  Have you tolerated food without any problems? Yes.    Have you been able to return to your normal activities? Yes.    Do you have any questions about your discharge instructions: Diet   No. Medications  No. Follow up visit  No.  Do you have questions or concerns about your Care? No.  Actions: * If pain score is 4 or above: No action needed, pain <4.

## 2015-12-22 ENCOUNTER — Encounter: Payer: Self-pay | Admitting: Internal Medicine

## 2015-12-28 ENCOUNTER — Encounter: Payer: Self-pay | Admitting: Internal Medicine

## 2016-01-02 ENCOUNTER — Other Ambulatory Visit: Payer: Self-pay | Admitting: Internal Medicine

## 2016-02-09 ENCOUNTER — Ambulatory Visit: Payer: Medicare Other | Admitting: Endocrinology

## 2016-02-16 ENCOUNTER — Ambulatory Visit: Payer: Medicare Other | Admitting: Endocrinology

## 2016-02-16 DIAGNOSIS — Z0289 Encounter for other administrative examinations: Secondary | ICD-10-CM

## 2016-03-04 ENCOUNTER — Other Ambulatory Visit: Payer: Self-pay | Admitting: Internal Medicine

## 2016-03-12 ENCOUNTER — Ambulatory Visit (INDEPENDENT_AMBULATORY_CARE_PROVIDER_SITE_OTHER): Payer: Medicare Other | Admitting: Endocrinology

## 2016-03-12 ENCOUNTER — Encounter: Payer: Self-pay | Admitting: Endocrinology

## 2016-03-12 VITALS — BP 144/60 | HR 71 | Ht 70.0 in | Wt 255.0 lb

## 2016-03-12 DIAGNOSIS — N182 Chronic kidney disease, stage 2 (mild): Secondary | ICD-10-CM

## 2016-03-12 DIAGNOSIS — Z23 Encounter for immunization: Secondary | ICD-10-CM | POA: Diagnosis not present

## 2016-03-12 DIAGNOSIS — E1122 Type 2 diabetes mellitus with diabetic chronic kidney disease: Secondary | ICD-10-CM

## 2016-03-12 LAB — POCT GLYCOSYLATED HEMOGLOBIN (HGB A1C): HEMOGLOBIN A1C: 5.8

## 2016-03-12 MED ORDER — NATEGLINIDE 60 MG PO TABS: 60.0000 mg | ORAL_TABLET | Freq: Two times a day (BID) | ORAL | 11 refills | Status: DC

## 2016-03-12 NOTE — Progress Notes (Signed)
Subjective:    Patient ID: Joseph Villa, male    DOB: 04-16-1941, 75 y.o.   MRN: [id number]  HPI Pt returns for f/u of diabetes mellitus: DM type: 2 Dx'ed: 2947 Complications: polyneuropathy and renal insufficiency. Therapy: 3 oral meds. DKA: never Severe hypoglycemia: never.  Pancreatitis: never.  Other: he has never been on insulin, but he has learned how; he did not tolerate metformin-XR (diarrhea); he cannot take pioglitizone, due to edema; renal insufficiency also limits oral rx options. He did not tolerate repaglinide (constipation) Interval history: He says cbg's are well-controlled.  pt states he feels well in general. Past Medical History:  Diagnosis Date  . ALLERGIC ASTHMA 11/25/2007  . ALLERGIC RHINITIS 11/25/2007  . Allergy   . Arthritis, lumbar spine ([place]) 04/18/2011  . BIPOLAR DISORDER UNSPECIFIED 10/06/2009  . Blood transfusion without reported diagnosis    s/p goiter surgery age 36   . COLONIC POLYPS, HX OF 10/06/2009  . Degenerative arthritis of hip 04/18/2011  . DJD (degenerative joint disease)   . DM w/o Complication Type II 65/09/6501  . GERD 10/06/2009  . Hemorrhage yrs ago   after goiter surgery, tracheostomy inserted and later removed  . Hemorrhoid   . HYPERLIPIDEMIA 10/06/2009  . HYPERTENSION, BENIGN 07/27/2009  . HYPOTHYROIDISM 10/06/2009  . NEPHROLITHIASIS, HX OF 10/06/2009  . OBSTRUCTIVE SLEEP APNEA 11/25/2007   cpap setting of 9  . Peptic stricture of esophagus   . RBBB (right bundle branch block)   . Sleep apnea    wears c pap    Past Surgical History:  Procedure Laterality Date  . COLONOSCOPY    . GANGLION CYST EXCISION     2-3 cysts removed  . thyroid Goiter surgery    . TONSILLECTOMY    . TOTAL HIP ARTHROPLASTY  06/16/2012   Procedure: TOTAL HIP ARTHROPLASTY ANTERIOR APPROACH;  Surgeon: Mauri Pole, MD;  Location: [facility];  Service: Orthopedics;  Laterality: Left;  . UMBILICAL HERNIA REPAIR  several yrs ago  . UPPER GASTROINTESTINAL  ENDOSCOPY      Social History   Social History  . Marital status: Married    Spouse name: N/A  . Number of children: 3  . Years of education: N/A   Occupational History  . retired    Social History Main Topics  . Smoking status: Former Smoker    Packs/day: 1.50    Years: 15.00    Types: Cigarettes, Pipe    Quit date: 06/23/1977  . Smokeless tobacco: Never Used  . Alcohol use No  . Drug use: No  . Sexual activity: Not on file   Other Topics Concern  . Not on file   Social History Narrative  . No narrative on file    Current Outpatient Prescriptions on File Prior to Visit  Medication Sig Dispense Refill  . ACCU-CHEK AVIVA PLUS test strip CHECK BLOOD SUGAR ONCE A DAY 100 each 1  . ACCU-CHEK FASTCLIX LANCETS MISC USE TWICE DAILY 102 each 2  . ADVAIR DISKUS 250-50 MCG/DOSE AEPB INHALE 1 PUFF AND RINSE MOUTH TWICE DAILY. MAINTENANCE INHALER 180 each 1  . aspirin 81 MG chewable tablet Chew 81 mg by mouth daily.    Marland Kitchen atorvastatin (LIPITOR) 10 MG tablet Take 1 tablet (10 mg total) by mouth daily. 90 tablet 3  . Blood Glucose Monitoring Suppl (ACCU-CHEK AVIVA PLUS) W/DEVICE KIT AS DIRECTED 1 kit 0  . bromocriptine (PARLODEL) 2.5 MG tablet Take 0.5 tablets (1.25 mg total) by mouth daily. [place]  tablet 3  . carbamazepine (TEGRETOL) 200 MG tablet TAKE 2 TABLETS AT BEDTIME 180 tablet 3  . Cholecalciferol (VITAMIN D3) 2000 UNITS TABS Take 2,000 Units by mouth every morning.     . clobetasol cream (TEMOVATE) 6.04 % Apply 1 application topically 2 (two) times daily as needed. For eczema.    . Coenzyme Q10 (CO Q 10) 100 MG CAPS Take 200 mg by mouth daily.    Marland Kitchen desonide (DESOWEN) 0.05 % cream Apply 1 application topically 2 (two) times daily.    . fluticasone (FLONASE) 50 MCG/ACT nasal spray ONE SPRAY INTO NOSE EVERY DAY 48 g 1  . glucosamine-chondroitin 500-400 MG tablet Take 1 tablet by mouth every morning.     Marland Kitchen glucose blood test strip Use to check blood sugar 2 times per day. DX Code:  E11.9 200 each 2  . hydrochlorothiazide (HYDRODIURIL) 25 MG tablet TAKE ONE TABLET EACH DAY 90 tablet 1  . hydrocortisone 2.5 % lotion As directed    . Lancets Misc. (ACCU-CHEK FASTCLIX LANCET) KIT AS DIRECTED 1 kit 0  . levothyroxine (SYNTHROID, LEVOTHROID) 112 MCG tablet TAKE ONE TABLET EACH DAY 90 tablet 1  . meloxicam (MOBIC) 15 MG tablet TAKE ONE TABLET EACH DAY 90 tablet 2  . Multiple Vitamins-Minerals (ICAPS AREDS FORMULA PO) Take 1 capsule by mouth daily.    . Na Sulfate-K Sulfate-Mg Sulf SOLN Take 1 kit by mouth once. 354 mL 0  . olmesartan (BENICAR) 40 MG tablet Take 1 tablet (40 mg total) by mouth daily. 90 tablet 3  . Omega-3 Fatty Acids (FISH OIL) 1200 MG CAPS Take 1 capsule by mouth daily.    Marland Kitchen OVER THE COUNTER MEDICATION Take 1 tablet by mouth daily. Tumeric    . pantoprazole (PROTONIX) 40 MG tablet TAKE ONE TABLET EACH DAY 90 tablet 1  . PROAIR HFA 108 (90 BASE) MCG/ACT inhaler 2 PUFFS FOUR TIMES DAILY AS NEEDED 8.5 g 0  . PROCTOZONE-HC 2.5 % rectal cream APPLY TWICE DAILY RECTALLY 30 g 0  . sitaGLIPtin (JANUVIA) 100 MG tablet Take 1 tablet (100 mg total) by mouth daily. 90 tablet 2  . tamsulosin (FLOMAX) 0.4 MG CAPS capsule Take 1 capsule (0.4 mg total) by mouth daily. 90 capsule 3   No current facility-administered medications on file prior to visit.     Allergies  Allergen Reactions  . Cefuroxime Axetil Anaphylaxis    REACTION: anaphylaxis-ceftin   . Cefprozil     REACTION: unknown  . Metformin And Related   . Rabeprazole Sodium Nausea Only    REACTION: nausea    Family History  Problem Relation Age of Onset  . Throat cancer Mother   . Alcohol abuse Brother   . Diabetes Brother   . Drug abuse Daughter   . Alcoholism      uncle  . Breast cancer Father   . Colon cancer Neg Hx   . Colon polyps Neg Hx   . Esophageal cancer Neg Hx   . Rectal cancer Neg Hx   . Stomach cancer Neg Hx     BP (!) 144/60   Pulse 71   Ht '5\' 10"'  (1.778 m)   Wt 255 lb (115.7  kg)   SpO2 95%   BMI 36.59 kg/m    Review of Systems He denies hypoglycemia.  He has lost a few lbs.      Objective:   Physical Exam VITAL SIGNS:  See vs page GENERAL: no distress Pulses: dorsalis pedis intact bilat.  MSK: no deformity of the feet CV: trace bilat leg edema Skin:  no ulcer on the feet.  normal color and temp on the feet.  Neuro: sensation is intact to touch on the feet, but decreased from normal.     A1c=5.8% Lab Results  Component Value Date   CREATININE 1.54 (H) 10/20/2015   BUN 41 (H) 10/20/2015   NA 139 10/20/2015   K 4.2 10/20/2015   CL 105 10/20/2015   CO2 25 10/20/2015      Assessment & Plan:  Type 2 DM: overcontrolled.    Patient is advised the following: Patient Instructions  Please reduce the nateglinide to breakfast and supper only.   check your blood sugar once a day.  vary the time of day when you check, between before the 3 meals, and at bedtime.  also check if you have symptoms of your blood sugar being too high or too low.  please keep a record of the readings and bring it to your next appointment here (or you can bring the meter itself).  You can write it on any piece of paper.  please call us sooner if your blood sugar goes below 70, or if you have a lot of readings over 200.  Please come back for a follow-up appointment in 4 months.

## 2016-03-12 NOTE — Patient Instructions (Addendum)
Please reduce the nateglinide to breakfast and supper only.   check your blood sugar once a day.  vary the time of day when you check, between before the 3 meals, and at bedtime.  also check if you have symptoms of your blood sugar being too high or too low.  please keep a record of the readings and bring it to your next appointment here (or you can bring the meter itself).  You can write it on any piece of paper.  please call us sooner if your blood sugar goes below 70, or if you have a lot of readings over 200.  Please come back for a follow-up appointment in 4 months.

## 2016-04-02 ENCOUNTER — Other Ambulatory Visit: Payer: Self-pay | Admitting: Internal Medicine

## 2016-04-03 ENCOUNTER — Other Ambulatory Visit: Payer: Self-pay | Admitting: Internal Medicine

## 2016-04-19 ENCOUNTER — Ambulatory Visit (INDEPENDENT_AMBULATORY_CARE_PROVIDER_SITE_OTHER): Payer: Medicare Other | Admitting: Internal Medicine

## 2016-04-19 ENCOUNTER — Encounter: Payer: Self-pay | Admitting: Internal Medicine

## 2016-04-19 VITALS — BP 110/56 | HR 76 | Temp 98.6°F | Wt 251.0 lb

## 2016-04-19 DIAGNOSIS — N183 Chronic kidney disease, stage 3 unspecified: Secondary | ICD-10-CM | POA: Insufficient documentation

## 2016-04-19 DIAGNOSIS — I10 Essential (primary) hypertension: Secondary | ICD-10-CM | POA: Diagnosis not present

## 2016-04-19 DIAGNOSIS — E785 Hyperlipidemia, unspecified: Secondary | ICD-10-CM | POA: Diagnosis not present

## 2016-04-19 DIAGNOSIS — Z0001 Encounter for general adult medical examination with abnormal findings: Secondary | ICD-10-CM

## 2016-04-19 DIAGNOSIS — E1122 Type 2 diabetes mellitus with diabetic chronic kidney disease: Secondary | ICD-10-CM

## 2016-04-19 DIAGNOSIS — N182 Chronic kidney disease, stage 2 (mild): Secondary | ICD-10-CM

## 2016-04-19 NOTE — Patient Instructions (Signed)
Please continue all other medications as before, and refills have been done if requested.  Please have the pharmacy call with any other refills you may need.  Please continue your efforts at being more active, low cholesterol diet, and weight control.  You are otherwise up to date with prevention measures today.  Please keep your appointments with your specialists as you may have planned  Please return in 6 months, or sooner if needed, with Lab testing done 3-5 days before  

## 2016-04-19 NOTE — Progress Notes (Signed)
Pre visit review using our clinic review tool, if applicable. No additional management support is needed unless otherwise documented below in the visit note. 

## 2016-04-19 NOTE — Progress Notes (Signed)
Subjective:    Patient ID: Joseph Villa, male    DOB: [birth year], 75 y.o.   MRN: [id number]  HPI  Here to f/u; overall doing ok,  Pt denies chest pain, increasing sob or doe, wheezing, orthopnea, PND, increased LE swelling, palpitations, dizziness or syncope.  Pt denies new neurological symptoms such as new headache, or facial or extremity weakness or numbness.  Pt denies polydipsia, polyuria, or low sugar episode.   Pt denies new neurological symptoms such as new headache, or facial or extremity weakness or numbness.   Pt states overall good compliance with meds, mostly trying to follow appropriate diet, with wt overall stable,  but little exercise however.  Peak wt has been 280 in the past, more recently 267 and has improved  Wt Readings from Last 3 Encounters:  04/19/16 251 lb (113.9 kg)  03/12/16 255 lb (115.7 kg)  12/20/15 258 lb (117 kg)  DM meds reduced per endo after oct 2017 a1c 5.8, felt to be mild overcontrolled.  Has not taken the lipitor due to myalgia and simply not willing to try another for now.  Januvia costs $1000 for 3 mo in the donut hole. Reminds me has mult Sabra Heck family males with ESRD on HD.  Past Medical History:  Diagnosis Date  . ALLERGIC ASTHMA 11/25/2007  . ALLERGIC RHINITIS 11/25/2007  . Allergy   . Arthritis, lumbar spine ([place]) 04/18/2011  . BIPOLAR DISORDER UNSPECIFIED 10/06/2009  . Blood transfusion without reported diagnosis    s/p goiter surgery age 30   . COLONIC POLYPS, HX OF 10/06/2009  . Degenerative arthritis of hip 04/18/2011  . DJD (degenerative joint disease)   . DM w/o Complication Type II 02/02/3299  . GERD 10/06/2009  . Hemorrhage yrs ago   after goiter surgery, tracheostomy inserted and later removed  . Hemorrhoid   . HYPERLIPIDEMIA 10/06/2009  . HYPERTENSION, BENIGN 07/27/2009  . HYPOTHYROIDISM 10/06/2009  . NEPHROLITHIASIS, HX OF 10/06/2009  . OBSTRUCTIVE SLEEP APNEA 11/25/2007   cpap setting of 9  . Peptic stricture of esophagus   . RBBB (right  bundle branch block)   . Sleep apnea    wears c pap   Past Surgical History:  Procedure Laterality Date  . COLONOSCOPY    . GANGLION CYST EXCISION     2-3 cysts removed  . thyroid Goiter surgery    . TONSILLECTOMY    . TOTAL HIP ARTHROPLASTY  06/16/2012   Procedure: TOTAL HIP ARTHROPLASTY ANTERIOR APPROACH;  Surgeon: Mauri Pole, MD;  Location: [facility];  Service: Orthopedics;  Laterality: Left;  . UMBILICAL HERNIA REPAIR  several yrs ago  . UPPER GASTROINTESTINAL ENDOSCOPY      reports that he quit smoking about 38 years ago. His smoking use included Cigarettes and Pipe. He has a 22.50 pack-year smoking history. He has never used smokeless tobacco. He reports that he does not drink alcohol or use drugs. family history includes Alcohol abuse in his brother; Breast cancer in his father; Diabetes in his brother; Drug abuse in his daughter; Throat cancer in his mother. Allergies  Allergen Reactions  . Cefuroxime Axetil Anaphylaxis    REACTION: anaphylaxis-ceftin   . Cefprozil     REACTION: unknown  . Lipitor [Atorvastatin]     myalgiaas  . Metformin And Related   . Rabeprazole Sodium Nausea Only    REACTION: nausea   Current Outpatient Prescriptions on File Prior to Visit  Medication Sig Dispense Refill  . ACCU-CHEK AVIVA PLUS test strip  CHECK BLOOD SUGAR ONCE A DAY 100 each 1  . ACCU-CHEK FASTCLIX LANCETS MISC USE TWICE DAILY 102 each 2  . ADVAIR DISKUS 250-50 MCG/DOSE AEPB INHALE 1 PUFF AND RINSE MOUTH TWICE DAILY. MAINTENANCE INHALER 180 each 1  . aspirin 81 MG chewable tablet Chew 81 mg by mouth daily.    Marland Kitchen atorvastatin (LIPITOR) 10 MG tablet Take 1 tablet (10 mg total) by mouth daily. 90 tablet 3  . Blood Glucose Monitoring Suppl (ACCU-CHEK AVIVA PLUS) W/DEVICE KIT AS DIRECTED 1 kit 0  . bromocriptine (PARLODEL) 2.5 MG tablet Take 0.5 tablets (1.25 mg total) by mouth daily. 45 tablet 3  . carbamazepine (TEGRETOL) 200 MG tablet TAKE 2 TABLETS AT BEDTIME 180 tablet 3  .  Cholecalciferol (VITAMIN D3) 2000 UNITS TABS Take 2,000 Units by mouth every morning.     . clobetasol cream (TEMOVATE) 6.76 % Apply 1 application topically 2 (two) times daily as needed. For eczema.    . Coenzyme Q10 (CO Q 10) 100 MG CAPS Take 200 mg by mouth daily.    Marland Kitchen desonide (DESOWEN) 0.05 % cream Apply 1 application topically 2 (two) times daily.    . fluticasone (FLONASE) 50 MCG/ACT nasal spray ONE SPRAY INTO NOSE EVERY DAY 48 g 1  . glucosamine-chondroitin 500-400 MG tablet Take 1 tablet by mouth every morning.     Marland Kitchen glucose blood test strip Use to check blood sugar 2 times per day. DX Code: E11.9 200 each 2  . hydrochlorothiazide (HYDRODIURIL) 25 MG tablet TAKE ONE TABLET EVERY DAY 90 tablet 0  . hydrocortisone 2.5 % lotion As directed    . JANUVIA 100 MG tablet TAKE ONE TABLET BY MOUTH ONCE DAILY 90 tablet 0  . Lancets Misc. (ACCU-CHEK FASTCLIX LANCET) KIT AS DIRECTED 1 kit 0  . levothyroxine (SYNTHROID, LEVOTHROID) 112 MCG tablet TAKE ONE TABLET EACH DAY 90 tablet 1  . meloxicam (MOBIC) 15 MG tablet TAKE ONE TABLET EACH DAY 90 tablet 2  . Multiple Vitamins-Minerals (ICAPS AREDS FORMULA PO) Take 1 capsule by mouth daily.    . Na Sulfate-K Sulfate-Mg Sulf SOLN Take 1 kit by mouth once. 354 mL 0  . nateglinide (STARLIX) 60 MG tablet Take 1 tablet (60 mg total) by mouth 2 (two) times daily with a meal. 60 tablet 11  . olmesartan (BENICAR) 40 MG tablet Take 1 tablet (40 mg total) by mouth daily. 90 tablet 3  . Omega-3 Fatty Acids (FISH OIL) 1200 MG CAPS Take 1 capsule by mouth daily.    Marland Kitchen OVER THE COUNTER MEDICATION Take 1 tablet by mouth daily. Tumeric    . pantoprazole (PROTONIX) 40 MG tablet TAKE ONE TABLET EACH DAY 90 tablet 0  . PROAIR HFA 108 (90 BASE) MCG/ACT inhaler 2 PUFFS FOUR TIMES DAILY AS NEEDED 8.5 g 0  . PROCTOZONE-HC 2.5 % rectal cream APPLY TWICE DAILY RECTALLY 30 g 0  . tamsulosin (FLOMAX) 0.4 MG CAPS capsule Take 1 capsule (0.4 mg total) by mouth daily. 90 capsule 3    No current facility-administered medications on file prior to visit.    Review of Systems  Constitutional: Negative for unusual diaphoresis or night sweats HENT: Negative for ear swelling or discharge Eyes: Negative for worsening visual haziness  Respiratory: Negative for choking and stridor.   Gastrointestinal: Negative for distension or worsening eructation Genitourinary: Negative for retention or change in urine volume.  Musculoskeletal: Negative for other MSK pain or swelling Skin: Negative for color change and worsening wound Neurological: Negative  for tremors and numbness other than noted  Psychiatric/Behavioral: Negative for decreased concentration or agitation other than above   All other system neg per pt    Objective:   Physical Exam BP (!) 110/56   Pulse 76   Temp 98.6 F (37 C)   Wt 251 lb (113.9 kg)   SpO2 99%   BMI 36.01 kg/m  VS noted, not ill appearing Constitutional: Pt appears in no apparent distress HENT: Head: NCAT.  Right Ear: External ear normal.  Left Ear: External ear normal.  Eyes: . Pupils are equal, round, and reactive to light. Conjunctivae and EOM are normal Neck: Normal range of motion. Neck supple.  Cardiovascular: Normal rate and regular rhythm.   Pulmonary/Chest: Effort normal and breath sounds without rales or wheezing.  Neurological: Pt is alert. Not confused , motor grossly intact Skin: Skin is warm. No rash, no LE edema Psychiatric: Pt behavior is normal. No agitation.  No other new exam findings    Assessment & Plan:

## 2016-04-20 NOTE — Assessment & Plan Note (Addendum)
stable overall by history and exam, recent data reviewed with pt, and pt to continue medical treatment as before,  to f/u any worsening symptoms or concerns Lab Results  Component Value Date   HGBA1C 5.8 03/12/2016   For f/u endo as planned

## 2016-04-20 NOTE — Assessment & Plan Note (Signed)
Goal ldl < 70, stable overall by history and exam, recent data reviewed with pt, and pt to continue medical treatment as before,  to f/u any worsening symptoms or concerns Lab Results  Component Value Date   LDLCALC 87 12/24/2013

## 2016-04-20 NOTE — Assessment & Plan Note (Signed)
stable overall by history and exam, recent data reviewed with pt, and pt to continue medical treatment as before,  to f/u any worsening symptoms or concerns Lab Results  Component Value Date   CREATININE 1.54 (H) 10/20/2015

## 2016-04-20 NOTE — Assessment & Plan Note (Signed)
stable overall by history and exam, recent data reviewed with pt, and pt to continue medical treatment as before,  to f/u any worsening symptoms or concerns\ BP Readings from Last 3 Encounters:  04/19/16 (!) 110/56  03/12/16 (!) 144/60  12/20/15 (!) 128/36

## 2016-06-05 ENCOUNTER — Other Ambulatory Visit: Payer: Self-pay | Admitting: Internal Medicine

## 2016-06-12 ENCOUNTER — Other Ambulatory Visit: Payer: Self-pay | Admitting: Internal Medicine

## 2016-06-24 DIAGNOSIS — G4733 Obstructive sleep apnea (adult) (pediatric): Secondary | ICD-10-CM | POA: Diagnosis not present

## 2016-07-06 ENCOUNTER — Other Ambulatory Visit: Payer: Self-pay | Admitting: Internal Medicine

## 2016-07-12 ENCOUNTER — Ambulatory Visit (INDEPENDENT_AMBULATORY_CARE_PROVIDER_SITE_OTHER): Payer: Medicare Other | Admitting: Endocrinology

## 2016-07-12 VITALS — BP 140/64 | HR 70 | Wt 256.0 lb

## 2016-07-12 DIAGNOSIS — E1122 Type 2 diabetes mellitus with diabetic chronic kidney disease: Secondary | ICD-10-CM | POA: Diagnosis not present

## 2016-07-12 DIAGNOSIS — N182 Chronic kidney disease, stage 2 (mild): Secondary | ICD-10-CM

## 2016-07-12 LAB — POCT GLYCOSYLATED HEMOGLOBIN (HGB A1C): Hemoglobin A1C: 5.7

## 2016-07-12 MED ORDER — NATEGLINIDE 60 MG PO TABS: 60.0000 mg | ORAL_TABLET | Freq: Every day | ORAL | 11 refills | Status: DC

## 2016-07-12 NOTE — Patient Instructions (Addendum)
Please reduce the nateglinide to supper only.   blood tests are requested for you today.  We'll let you know about the results.  This will verify the a1c check your blood sugar once a day.  vary the time of day when you check, between before the 3 meals, and at bedtime.  also check if you have symptoms of your blood sugar being too high or too low.  please keep a record of the readings and bring it to your next appointment here (or you can bring the meter itself).  You can write it on any piece of paper.  please call us sooner if your blood sugar goes below 70, or if you have a lot of readings over 200.  Please come back for a follow-up appointment in 3 months.

## 2016-07-12 NOTE — Progress Notes (Signed)
Subjective:    Patient ID: Joseph Villa, male    DOB: 1941/02/06, 76 y.o.   MRN: [id number]  HPI Pt returns for f/u of diabetes mellitus: DM type: 2 Dx'ed: 0277 Complications: polyneuropathy and renal insufficiency. Therapy: 3 oral meds. DKA: never.   Severe hypoglycemia: never.  Pancreatitis: never.  Other: he has never been on insulin, but he has learned how; he did not tolerate metformin-XR (diarrhea); he cannot take pioglitizone, due to edema; renal insufficiency also limits oral rx options. He did not tolerate repaglinide (constipation) Interval history: He says cbg's are well-controlled.  pt states he feels well in general.   Past Medical History:  Diagnosis Date  . ALLERGIC ASTHMA 11/25/2007  . ALLERGIC RHINITIS 11/25/2007  . Allergy   . Arthritis, lumbar spine ([place]) 04/18/2011  . BIPOLAR DISORDER UNSPECIFIED 10/06/2009  . Blood transfusion without reported diagnosis    s/p goiter surgery age 54   . COLONIC POLYPS, HX OF 10/06/2009  . Degenerative arthritis of hip 04/18/2011  . DJD (degenerative joint disease)   . DM w/o Complication Type II 41/07/8784  . GERD 10/06/2009  . Hemorrhage yrs ago   after goiter surgery, tracheostomy inserted and later removed  . Hemorrhoid   . HYPERLIPIDEMIA 10/06/2009  . HYPERTENSION, BENIGN 07/27/2009  . HYPOTHYROIDISM 10/06/2009  . NEPHROLITHIASIS, HX OF 10/06/2009  . OBSTRUCTIVE SLEEP APNEA 11/25/2007   cpap setting of 9  . Peptic stricture of esophagus   . RBBB (right bundle branch block)   . Sleep apnea    wears c pap    Past Surgical History:  Procedure Laterality Date  . COLONOSCOPY    . GANGLION CYST EXCISION     2-3 cysts removed  . thyroid Goiter surgery    . TONSILLECTOMY    . TOTAL HIP ARTHROPLASTY  06/16/2012   Procedure: TOTAL HIP ARTHROPLASTY ANTERIOR APPROACH;  Surgeon: Mauri Pole, MD;  Location: [facility];  Service: Orthopedics;  Laterality: Left;  . UMBILICAL HERNIA REPAIR  several yrs ago  . UPPER GASTROINTESTINAL  ENDOSCOPY      Social History   Social History  . Marital status: Married    Spouse name: N/A  . Number of children: 3  . Years of education: N/A   Occupational History  . retired    Social History Main Topics  . Smoking status: Former Smoker    Packs/day: 1.50    Years: 15.00    Types: Cigarettes, Pipe    Quit date: 06/23/1977  . Smokeless tobacco: Never Used  . Alcohol use No  . Drug use: No  . Sexual activity: Not on file   Other Topics Concern  . Not on file   Social History Narrative  . No narrative on file    Current Outpatient Prescriptions on File Prior to Visit  Medication Sig Dispense Refill  . ACCU-CHEK AVIVA PLUS test strip CHECK BLOOD SUGAR ONCE A DAY 100 each 1  . ACCU-CHEK FASTCLIX LANCETS MISC USE TWICE DAILY 102 each 2  . ADVAIR DISKUS 250-50 MCG/DOSE AEPB INHALE 1 PUFF AND RINSE MOUTH TWICE DAILY. MAINTENANCE INHALER 180 each 1  . aspirin 81 MG chewable tablet Chew 81 mg by mouth daily.    Marland Kitchen atorvastatin (LIPITOR) 10 MG tablet Take 1 tablet (10 mg total) by mouth daily. 90 tablet 3  . Blood Glucose Monitoring Suppl (ACCU-CHEK AVIVA PLUS) W/DEVICE KIT AS DIRECTED 1 kit 0  . bromocriptine (PARLODEL) 2.5 MG tablet Take 0.5 tablets (1.25 mg total)  by mouth daily. 45 tablet 3  . carbamazepine (TEGRETOL) 200 MG tablet TAKE 2 TABLETS AT BEDTIME 180 tablet 3  . Cholecalciferol (VITAMIN D3) 2000 UNITS TABS Take 2,000 Units by mouth every morning.     . clobetasol cream (TEMOVATE) 9.47 % Apply 1 application topically 2 (two) times daily as needed. For eczema.    . Coenzyme Q10 (CO Q 10) 100 MG CAPS Take 200 mg by mouth daily.    Marland Kitchen desonide (DESOWEN) 0.05 % cream Apply 1 application topically 2 (two) times daily.    . fluticasone (FLONASE) 50 MCG/ACT nasal spray ONE SPRAY INTO NOSE EVERY DAY 48 g 1  . glucosamine-chondroitin 500-400 MG tablet Take 1 tablet by mouth every morning.     Marland Kitchen glucose blood test strip Use to check blood sugar 2 times per day. DX Code:  E11.9 200 each 2  . hydrochlorothiazide (HYDRODIURIL) 25 MG tablet TAKE ONE TABLET EACH DAY 90 tablet 2  . hydrocortisone 2.5 % lotion As directed    . JANUVIA 100 MG tablet TAKE ONE TABLET BY MOUTH ONCE DAILY 90 tablet 0  . Lancets Misc. (ACCU-CHEK FASTCLIX LANCET) KIT AS DIRECTED 1 kit 0  . levothyroxine (SYNTHROID, LEVOTHROID) 112 MCG tablet TAKE ONE TABLET EACH DAY 90 tablet 0  . meloxicam (MOBIC) 15 MG tablet TAKE ONE TABLET EACH DAY 90 tablet 2  . Multiple Vitamins-Minerals (ICAPS AREDS FORMULA PO) Take 1 capsule by mouth daily.    . Na Sulfate-K Sulfate-Mg Sulf SOLN Take 1 kit by mouth once. 354 mL 0  . olmesartan (BENICAR) 40 MG tablet Take 1 tablet (40 mg total) by mouth daily. 90 tablet 3  . Omega-3 Fatty Acids (FISH OIL) 1200 MG CAPS Take 1 capsule by mouth daily.    Marland Kitchen OVER THE COUNTER MEDICATION Take 1 tablet by mouth daily. Tumeric    . pantoprazole (PROTONIX) 40 MG tablet TAKE ONE TABLET EACH DAY 90 tablet 0  . PROAIR HFA 108 (90 BASE) MCG/ACT inhaler 2 PUFFS FOUR TIMES DAILY AS NEEDED 8.5 g 0  . PROCTOZONE-HC 2.5 % rectal cream APPLY TWICE DAILY RECTALLY 30 g 0  . tamsulosin (FLOMAX) 0.4 MG CAPS capsule Take 1 capsule (0.4 mg total) by mouth daily. 90 capsule 3   No current facility-administered medications on file prior to visit.     Allergies  Allergen Reactions  . Cefuroxime Axetil Anaphylaxis    REACTION: anaphylaxis-ceftin   . Cefprozil     REACTION: unknown  . Lipitor [Atorvastatin]     myalgiaas  . Metformin And Related   . Rabeprazole Sodium Nausea Only    REACTION: nausea    Family History  Problem Relation Age of Onset  . Throat cancer Mother   . Alcohol abuse Brother   . Diabetes Brother   . Drug abuse Daughter   . Alcoholism      uncle  . Breast cancer Father   . Colon cancer Neg Hx   . Colon polyps Neg Hx   . Esophageal cancer Neg Hx   . Rectal cancer Neg Hx   . Stomach cancer Neg Hx     BP 140/64   Pulse 70   Wt 256 lb (116.1 kg)    SpO2 96%   BMI 36.73 kg/m   Review of Systems He has gained weight.  He denies hypoglycemia.     Objective:   Physical Exam VITAL SIGNS:  See vs page.   GENERAL: no distress.  Pulses: dorsalis pedis intact  bilat.   MSK: no deformity of the feet.   CV: trace bilat leg edema.  Skin:  no ulcer on the feet.  normal color and temp on the feet.  Neuro: sensation is intact to touch on the feet, but decreased from normal.     A1c=5.7%    Assessment & Plan:  Type 2 DM, with renal insufficiency: still overcontrolled.  Patient is advised the following: Patient Instructions  Please reduce the nateglinide to supper only.   blood tests are requested for you today.  We'll let you know about the results.  This will verify the a1c check your blood sugar once a day.  vary the time of day when you check, between before the 3 meals, and at bedtime.  also check if you have symptoms of your blood sugar being too high or too low.  please keep a record of the readings and bring it to your next appointment here (or you can bring the meter itself).  You can write it on any piece of paper.  please call us sooner if your blood sugar goes below 70, or if you have a lot of readings over 200.  Please come back for a follow-up appointment in 3 months.

## 2016-07-15 ENCOUNTER — Other Ambulatory Visit: Payer: Self-pay | Admitting: Internal Medicine

## 2016-07-15 LAB — FRUCTOSAMINE: FRUCTOSAMINE: 245 umol/L (ref 190–270)

## 2016-07-23 ENCOUNTER — Other Ambulatory Visit: Payer: Self-pay | Admitting: Internal Medicine

## 2016-09-05 ENCOUNTER — Other Ambulatory Visit: Payer: Self-pay | Admitting: Internal Medicine

## 2016-09-06 ENCOUNTER — Telehealth: Payer: Self-pay | Admitting: Endocrinology

## 2016-09-06 NOTE — Telephone Encounter (Signed)
Pt came by and said that he ws needing his Januvia refilled, he was told by the pharmacy that Dr. Gwynn Burly office said he needed to be seen, he is concerned because he doesn't want to be without the medication and because Dr. Loanne Drilling is treating him for his diabetes, can he just refill his Januvia. Please advise.

## 2016-09-09 MED ORDER — SITAGLIPTIN PHOSPHATE 100 MG PO TABS: 100.0000 mg | ORAL_TABLET | Freq: Every day | ORAL | 1 refills | Status: DC

## 2016-09-09 NOTE — Telephone Encounter (Signed)
Message was not reviewed until 09/09/2016. Refill submitted to [facility].

## 2016-09-16 ENCOUNTER — Other Ambulatory Visit: Payer: Self-pay | Admitting: Internal Medicine

## 2016-09-23 DIAGNOSIS — G4733 Obstructive sleep apnea (adult) (pediatric): Secondary | ICD-10-CM | POA: Diagnosis not present

## 2016-10-11 ENCOUNTER — Ambulatory Visit (INDEPENDENT_AMBULATORY_CARE_PROVIDER_SITE_OTHER): Payer: Medicare Other | Admitting: Endocrinology

## 2016-10-11 ENCOUNTER — Encounter: Payer: Self-pay | Admitting: Endocrinology

## 2016-10-11 ENCOUNTER — Other Ambulatory Visit: Payer: Self-pay | Admitting: Internal Medicine

## 2016-10-11 VITALS — BP 148/68 | HR 67 | Ht 70.0 in | Wt 260.0 lb

## 2016-10-11 DIAGNOSIS — E1122 Type 2 diabetes mellitus with diabetic chronic kidney disease: Secondary | ICD-10-CM | POA: Diagnosis not present

## 2016-10-11 DIAGNOSIS — N182 Chronic kidney disease, stage 2 (mild): Secondary | ICD-10-CM

## 2016-10-11 LAB — POCT GLYCOSYLATED HEMOGLOBIN (HGB A1C): HEMOGLOBIN A1C: 6.1

## 2016-10-11 MED ORDER — SITAGLIPTIN PHOSPHATE 100 MG PO TABS: 50.0000 mg | ORAL_TABLET | Freq: Every day | ORAL | 3 refills | Status: DC

## 2016-10-11 NOTE — Patient Instructions (Addendum)
Please reduce the januvia to 1/2 pill per day.   Please continue the same other medications.  check your blood sugar once a day.  vary the time of day when you check, between before the 3 meals, and at bedtime.  also check if you have symptoms of your blood sugar being too high or too low.  please keep a record of the readings and bring it to your next appointment here (or you can bring the meter itself).  You can write it on any piece of paper.  please call us sooner if your blood sugar goes below 70, or if you have a lot of readings over 200.  Please come back for a follow-up appointment in 4 months.

## 2016-10-11 NOTE — Progress Notes (Signed)
Subjective:    Patient ID: Joseph Villa, male    DOB: 30-Apr-1941, 76 y.o.   MRN: [id number]  HPI Pt returns for f/u of diabetes mellitus: DM type: 2 Dx'ed: 9937 Complications: polyneuropathy and renal insufficiency. Therapy: 3 oral meds. DKA: never.   Severe hypoglycemia: never.  Pancreatitis: never.  Other: he has never been on insulin, but he has learned how; he did not tolerate metformin-XR (diarrhea); he cannot take pioglitizone, due to edema; renal insufficiency also limits oral rx options. He did not tolerate repaglinide (constipation).   Interval history: He says cbg's are well-controlled.  pt states he feels well in general.   Past Medical History:  Diagnosis Date  . ALLERGIC ASTHMA 11/25/2007  . ALLERGIC RHINITIS 11/25/2007  . Allergy   . Arthritis, lumbar spine ([place]) 04/18/2011  . BIPOLAR DISORDER UNSPECIFIED 10/06/2009  . Blood transfusion without reported diagnosis    s/p goiter surgery age 52   . COLONIC POLYPS, HX OF 10/06/2009  . Degenerative arthritis of hip 04/18/2011  . DJD (degenerative joint disease)   . DM w/o Complication Type II 16/02/6788  . GERD 10/06/2009  . Hemorrhage yrs ago   after goiter surgery, tracheostomy inserted and later removed  . Hemorrhoid   . HYPERLIPIDEMIA 10/06/2009  . HYPERTENSION, BENIGN 07/27/2009  . HYPOTHYROIDISM 10/06/2009  . NEPHROLITHIASIS, HX OF 10/06/2009  . OBSTRUCTIVE SLEEP APNEA 11/25/2007   cpap setting of 9  . Peptic stricture of esophagus   . RBBB (right bundle branch block)   . Sleep apnea    wears c pap    Past Surgical History:  Procedure Laterality Date  . COLONOSCOPY    . GANGLION CYST EXCISION     2-3 cysts removed  . thyroid Goiter surgery    . TONSILLECTOMY    . TOTAL HIP ARTHROPLASTY  06/16/2012   Procedure: TOTAL HIP ARTHROPLASTY ANTERIOR APPROACH;  Surgeon: Mauri Pole, MD;  Location: [facility];  Service: Orthopedics;  Laterality: Left;  . UMBILICAL HERNIA REPAIR  several yrs ago  . UPPER GASTROINTESTINAL  ENDOSCOPY      Social History   Social History  . Marital status: Married    Spouse name: N/A  . Number of children: 3  . Years of education: N/A   Occupational History  . retired    Social History Main Topics  . Smoking status: Former Smoker    Packs/day: 1.50    Years: 15.00    Types: Cigarettes, Pipe    Quit date: 06/23/1977  . Smokeless tobacco: Never Used  . Alcohol use No  . Drug use: No  . Sexual activity: Not on file   Other Topics Concern  . Not on file   Social History Narrative  . No narrative on file    Current Outpatient Prescriptions on File Prior to Visit  Medication Sig Dispense Refill  . ACCU-CHEK FASTCLIX LANCETS MISC USE TWICE DAILY 102 each 2  . ADVAIR DISKUS 250-50 MCG/DOSE AEPB INHALE 1 PUFF AND RINSE MOUTH TWICE DAILY. MAINTENANCE INHALER 180 each 1  . atorvastatin (LIPITOR) 10 MG tablet Take 1 tablet (10 mg total) by mouth daily. 90 tablet 3  . Blood Glucose Monitoring Suppl (ACCU-CHEK AVIVA PLUS) W/DEVICE KIT AS DIRECTED 1 kit 0  . bromocriptine (PARLODEL) 2.5 MG tablet Take 0.5 tablets (1.25 mg total) by mouth daily. 45 tablet 3  . carbamazepine (TEGRETOL) 200 MG tablet TAKE 2 TABLETS AT BEDTIME 180 tablet 3  . Cholecalciferol (VITAMIN D3) 2000 UNITS TABS  Take 2,000 Units by mouth every morning.     . clobetasol cream (TEMOVATE) 2.70 % Apply 1 application topically 2 (two) times daily as needed. For eczema.    . Coenzyme Q10 (CO Q 10) 100 MG CAPS Take 200 mg by mouth daily.    Marland Kitchen desonide (DESOWEN) 0.05 % cream Apply 1 application topically 2 (two) times daily.    . fluticasone (FLONASE) 50 MCG/ACT nasal spray ONE SPRAY INTO NOSE EVERY DAY 48 g 1  . glucosamine-chondroitin 500-400 MG tablet Take 1 tablet by mouth every morning.     Marland Kitchen glucose blood (ACCU-CHEK AVIVA PLUS) test strip Used to check blood sugars twice a day Dx E11.9 200 each 0  . glucose blood test strip Use to check blood sugar 2 times per day. DX Code: E11.9 200 each 2  .  hydrochlorothiazide (HYDRODIURIL) 25 MG tablet TAKE ONE TABLET EACH DAY 90 tablet 2  . hydrocortisone 2.5 % lotion As directed    . Lancets Misc. (ACCU-CHEK FASTCLIX LANCET) KIT AS DIRECTED 1 kit 0  . levothyroxine (SYNTHROID, LEVOTHROID) 112 MCG tablet TAKE ONE TABLET EACH DAY 90 tablet 1  . meloxicam (MOBIC) 15 MG tablet TAKE ONE TABLET EACH DAY 90 tablet 1  . Multiple Vitamins-Minerals (ICAPS AREDS FORMULA PO) Take 1 capsule by mouth daily.    . Na Sulfate-K Sulfate-Mg Sulf SOLN Take 1 kit by mouth once. 354 mL 0  . nateglinide (STARLIX) 60 MG tablet Take 1 tablet (60 mg total) by mouth daily with supper. 60 tablet 11  . olmesartan (BENICAR) 40 MG tablet Take 1 tablet (40 mg total) by mouth daily. 90 tablet 3  . Omega-3 Fatty Acids (FISH OIL) 1200 MG CAPS Take 1 capsule by mouth daily.    Marland Kitchen OVER THE COUNTER MEDICATION Take 1 tablet by mouth daily. Tumeric    . PROAIR HFA 108 (90 BASE) MCG/ACT inhaler 2 PUFFS FOUR TIMES DAILY AS NEEDED 8.5 g 0  . PROCTOZONE-HC 2.5 % rectal cream APPLY TWICE DAILY RECTALLY 30 g 0  . aspirin 81 MG chewable tablet Chew 81 mg by mouth daily.     No current facility-administered medications on file prior to visit.     Allergies  Allergen Reactions  . Cefuroxime Axetil Anaphylaxis    REACTION: anaphylaxis-ceftin   . Cefprozil     REACTION: unknown  . Lipitor [Atorvastatin]     myalgiaas  . Metformin And Related   . Rabeprazole Sodium Nausea Only    REACTION: nausea    Family History  Problem Relation Age of Onset  . Throat cancer Mother   . Alcohol abuse Brother   . Diabetes Brother   . Drug abuse Daughter   . Alcoholism Unknown        uncle  . Breast cancer Father   . Colon cancer Neg Hx   . Colon polyps Neg Hx   . Esophageal cancer Neg Hx   . Rectal cancer Neg Hx   . Stomach cancer Neg Hx     BP (!) 148/68 (BP Location: Left Arm, Patient Position: Sitting, Cuff Size: Normal)   Pulse 67   Ht '5\' 10"'  (1.778 m)   Wt 260 lb (117.9 kg)    SpO2 95%   BMI 37.31 kg/m   Review of Systems He denies hypoglycemia.      Objective:   Physical Exam VITAL SIGNS:  See vs page GENERAL: no distress Pulses: dorsalis pedis intact bilat.   MSK: no deformity of the  feet CV: no leg edema Skin:  no ulcer on the feet.  normal color and temp on the feet. Neuro: sensation is intact to touch on the feet.    Lab Results  Component Value Date   CREATININE 1.54 (H) 10/20/2015   BUN 41 (H) 10/20/2015   NA 139 10/20/2015   K 4.2 10/20/2015   CL 105 10/20/2015   CO2 25 10/20/2015        Assessment & Plan:  Type 2 DM: well-controlled Renal insuff: he needs to reduce [place]  Patient Instructions  Please reduce the januvia to 1/2 pill per day.   Please continue the same other medications.  check your blood sugar once a day.  vary the time of day when you check, between before the 3 meals, and at bedtime.  also check if you have symptoms of your blood sugar being too high or too low.  please keep a record of the readings and bring it to your next appointment here (or you can bring the meter itself).  You can write it on any piece of paper.  please call us sooner if your blood sugar goes below 70, or if you have a lot of readings over 200.  Please come back for a follow-up appointment in 4 months.

## 2016-10-18 ENCOUNTER — Ambulatory Visit: Payer: Medicare Other | Admitting: Internal Medicine

## 2016-10-23 ENCOUNTER — Encounter: Payer: Self-pay | Admitting: Internal Medicine

## 2016-10-23 ENCOUNTER — Other Ambulatory Visit (INDEPENDENT_AMBULATORY_CARE_PROVIDER_SITE_OTHER): Payer: Medicare Other

## 2016-10-23 ENCOUNTER — Other Ambulatory Visit: Payer: Self-pay | Admitting: Internal Medicine

## 2016-10-23 ENCOUNTER — Ambulatory Visit (INDEPENDENT_AMBULATORY_CARE_PROVIDER_SITE_OTHER): Payer: Medicare Other | Admitting: Internal Medicine

## 2016-10-23 VITALS — BP 134/70 | HR 76 | Ht 70.5 in | Wt 268.0 lb

## 2016-10-23 DIAGNOSIS — R7989 Other specified abnormal findings of blood chemistry: Secondary | ICD-10-CM | POA: Diagnosis not present

## 2016-10-23 DIAGNOSIS — Z0001 Encounter for general adult medical examination with abnormal findings: Secondary | ICD-10-CM | POA: Diagnosis not present

## 2016-10-23 DIAGNOSIS — R079 Chest pain, unspecified: Secondary | ICD-10-CM

## 2016-10-23 DIAGNOSIS — N182 Chronic kidney disease, stage 2 (mild): Secondary | ICD-10-CM

## 2016-10-23 DIAGNOSIS — I10 Essential (primary) hypertension: Secondary | ICD-10-CM | POA: Diagnosis not present

## 2016-10-23 DIAGNOSIS — E1122 Type 2 diabetes mellitus with diabetic chronic kidney disease: Secondary | ICD-10-CM

## 2016-10-23 LAB — TSH: TSH: 3.08 u[IU]/mL (ref 0.35–4.50)

## 2016-10-23 LAB — BASIC METABOLIC PANEL
BUN: 36 mg/dL — ABNORMAL HIGH (ref 6–23)
CO2: 27 mEq/L (ref 19–32)
Calcium: 9.4 mg/dL (ref 8.4–10.5)
Chloride: 106 mEq/L (ref 96–112)
Creatinine, Ser: 1.56 mg/dL — ABNORMAL HIGH (ref 0.40–1.50)
GFR: 46.53 mL/min — AB (ref >60.00)
Glucose, Bld: 125 mg/dL — ABNORMAL HIGH (ref 70–99)
Potassium: 4.8 mEq/L (ref 3.5–5.1)
SODIUM: 141 meq/L (ref 135–145)

## 2016-10-23 LAB — HEPATIC FUNCTION PANEL
ALK PHOS: 64 U/L (ref 39–117)
ALT: 18 U/L (ref 0–53)
AST: 17 U/L (ref 0–37)
Albumin: 4.5 g/dL (ref 3.5–5.2)
BILIRUBIN DIRECT: 0.2 mg/dL (ref 0.0–0.3)
BILIRUBIN TOTAL: 0.7 mg/dL (ref 0.2–1.2)
Total Protein: 6.7 g/dL (ref 6.0–8.3)

## 2016-10-23 LAB — LIPID PANEL
Cholesterol: 241 mg/dL — ABNORMAL HIGH (ref 0–200)
HDL: 39.6 mg/dL (ref >39.00)
Total CHOL/HDL Ratio: 6

## 2016-10-23 LAB — URINALYSIS, ROUTINE W REFLEX MICROSCOPIC
Bilirubin Urine: NEGATIVE
Hgb urine dipstick: NEGATIVE
Ketones, ur: NEGATIVE
Leukocytes, UA: NEGATIVE
Nitrite: NEGATIVE
RBC / HPF: NONE SEEN (ref 0–?)
SPECIFIC GRAVITY, URINE: 1.02 (ref 1.000–1.030)
Total Protein, Urine: NEGATIVE
URINE GLUCOSE: NEGATIVE
Urobilinogen, UA: 0.2 (ref 0.0–1.0)
WBC, UA: NONE SEEN (ref 0–?)
pH: 6 (ref 5.0–8.0)

## 2016-10-23 LAB — CBC WITH DIFFERENTIAL/PLATELET
BASOS PCT: 1.4 % (ref 0.0–3.0)
Basophils Absolute: 0.1 10*3/uL (ref 0.0–0.1)
Eosinophils Absolute: 0.3 10*3/uL (ref 0.0–0.7)
Eosinophils Relative: 3.5 % (ref 0.0–5.0)
HCT: 39.2 % (ref 39.0–52.0)
HEMOGLOBIN: 13.6 g/dL (ref 13.0–17.0)
LYMPHS ABS: 2.2 10*3/uL (ref 0.7–4.0)
Lymphocytes Relative: 27 % (ref 12.0–46.0)
MCHC: 34.7 g/dL (ref 30.0–36.0)
MCV: 90.4 fl (ref 78.0–100.0)
MONO ABS: 0.7 10*3/uL (ref 0.1–1.0)
Monocytes Relative: 8.5 % (ref 3.0–12.0)
NEUTROS PCT: 59.6 % (ref 43.0–77.0)
Neutro Abs: 4.9 10*3/uL (ref 1.4–7.7)
Platelets: 192 10*3/uL (ref 150.0–400.0)
RBC: 4.34 Mil/uL (ref 4.22–5.81)
RDW: 13.9 % (ref 11.5–15.5)
WBC: 8.2 10*3/uL (ref 4.0–10.5)

## 2016-10-23 LAB — HEMOGLOBIN A1C: Hgb A1c MFr Bld: 6.6 % — ABNORMAL HIGH (ref 4.6–6.5)

## 2016-10-23 LAB — MICROALBUMIN / CREATININE URINE RATIO
Creatinine,U: 116.3 mg/dL
Microalb Creat Ratio: 2.3 mg/g (ref 0.0–30.0)
Microalb, Ur: 2.7 mg/dL — ABNORMAL HIGH (ref 0.0–1.9)

## 2016-10-23 LAB — LDL CHOLESTEROL, DIRECT: Direct LDL: 144 mg/dL

## 2016-10-23 LAB — PSA: PSA: 0.51 ng/mL (ref 0.10–4.00)

## 2016-10-23 MED ORDER — ROSUVASTATIN CALCIUM 20 MG PO TABS: 20.0000 mg | ORAL_TABLET | Freq: Every day | ORAL | 3 refills | Status: DC

## 2016-10-23 NOTE — Progress Notes (Signed)
Subjective:    Patient ID: Joseph Villa, male    DOB: 1940/06/10, 76 y.o.   MRN: [id number]  HPI  Here for wellness and f/u;  Overall doing ok;  Pt denies worsening SOB, DOE, wheezing, orthopnea, PND, worsening LE edema, palpitations, dizziness or syncope.  Pt denies neurological change such as new headache, facial or extremity weakness.  Pt denies polydipsia, polyuria, or low sugar symptoms. Pt states overall good compliance with treatment and medications, good tolerability, and has been trying to follow appropriate diet.  Pt denies worsening depressive symptoms, suicidal ideation or panic. No fever, night sweats, wt loss, loss of appetite, or other constitutional symptoms.  Pt states good ability with ADL's, has low fall risk, home safety reviewed and adequate, no other significant changes in hearing or vision, and only occasionally active with exercise.  "I had a situation a couple of weeks ago", with right lateral chest pain dull intermittent without radiation or assoc symptoms before resolved after 2 days, but then later with then SSCP ? Indigestion a "couple of times", dull intermittent, without radiation, n/v, palp, diaphoresis or dizziness., lasting several minutes each, nonexertional, nonpleuritic, nonpositional. Past Medical History:  Diagnosis Date  . ALLERGIC ASTHMA 11/25/2007  . ALLERGIC RHINITIS 11/25/2007  . Allergy   . Arthritis, lumbar spine ([place]) 04/18/2011  . BIPOLAR DISORDER UNSPECIFIED 10/06/2009  . Blood transfusion without reported diagnosis    s/p goiter surgery age 47   . COLONIC POLYPS, HX OF 10/06/2009  . Degenerative arthritis of hip 04/18/2011  . DJD (degenerative joint disease)   . DM w/o Complication Type II 97/11/7339  . GERD 10/06/2009  . Hemorrhage yrs ago   after goiter surgery, tracheostomy inserted and later removed  . Hemorrhoid   . HYPERLIPIDEMIA 10/06/2009  . HYPERTENSION, BENIGN 07/27/2009  . HYPOTHYROIDISM 10/06/2009  . NEPHROLITHIASIS, HX OF 10/06/2009  .  OBSTRUCTIVE SLEEP APNEA 11/25/2007   cpap setting of 9  . Peptic stricture of esophagus   . RBBB (right bundle branch block)   . Sleep apnea    wears c pap   Past Surgical History:  Procedure Laterality Date  . COLONOSCOPY    . GANGLION CYST EXCISION     2-3 cysts removed  . thyroid Goiter surgery    . TONSILLECTOMY    . TOTAL HIP ARTHROPLASTY  06/16/2012   Procedure: TOTAL HIP ARTHROPLASTY ANTERIOR APPROACH;  Surgeon: Mauri Pole, MD;  Location: [facility];  Service: Orthopedics;  Laterality: Left;  . UMBILICAL HERNIA REPAIR  several yrs ago  . UPPER GASTROINTESTINAL ENDOSCOPY      reports that he quit smoking about 39 years ago. His smoking use included Cigarettes and Pipe. He has a 22.50 pack-year smoking history. He has never used smokeless tobacco. He reports that he does not drink alcohol or use drugs. family history includes Alcohol abuse in his brother; Breast cancer in his father; Diabetes in his brother; Drug abuse in his daughter; Throat cancer in his mother. Allergies  Allergen Reactions  . Cefuroxime Axetil Anaphylaxis    REACTION: anaphylaxis-ceftin   . Cefprozil     REACTION: unknown  . Lipitor [Atorvastatin]     myalgiaas  . Metformin And Related   . Rabeprazole Sodium Nausea Only    REACTION: nausea   Current Outpatient Prescriptions on File Prior to Visit  Medication Sig Dispense Refill  . ACCU-CHEK FASTCLIX LANCETS MISC USE TWICE DAILY 102 each 2  . ADVAIR DISKUS 250-50 MCG/DOSE AEPB INHALE 1 PUFF AND  RINSE MOUTH TWICE DAILY. MAINTENANCE INHALER 180 each 1  . aspirin 81 MG chewable tablet Chew 81 mg by mouth daily.    Marland Kitchen atorvastatin (LIPITOR) 10 MG tablet Take 1 tablet (10 mg total) by mouth daily. 90 tablet 3  . Blood Glucose Monitoring Suppl (ACCU-CHEK AVIVA PLUS) W/DEVICE KIT AS DIRECTED 1 kit 0  . bromocriptine (PARLODEL) 2.5 MG tablet Take 0.5 tablets (1.25 mg total) by mouth daily. 45 tablet 3  . carbamazepine (TEGRETOL) 200 MG tablet TAKE 2 TABLETS AT  BEDTIME 180 tablet 3  . Cholecalciferol (VITAMIN D3) 2000 UNITS TABS Take 2,000 Units by mouth every morning.     . clobetasol cream (TEMOVATE) 3.49 % Apply 1 application topically 2 (two) times daily as needed. For eczema.    . Coenzyme Q10 (CO Q 10) 100 MG CAPS Take 200 mg by mouth daily.    Marland Kitchen desonide (DESOWEN) 0.05 % cream Apply 1 application topically 2 (two) times daily.    . fluticasone (FLONASE) 50 MCG/ACT nasal spray ONE SPRAY INTO NOSE EVERY DAY 48 g 1  . glucosamine-chondroitin 500-400 MG tablet Take 1 tablet by mouth every morning.     Marland Kitchen glucose blood (ACCU-CHEK AVIVA PLUS) test strip Used to check blood sugars twice a day Dx E11.9 200 each 0  . glucose blood test strip Use to check blood sugar 2 times per day. DX Code: E11.9 200 each 2  . hydrochlorothiazide (HYDRODIURIL) 25 MG tablet TAKE ONE TABLET EACH DAY 90 tablet 2  . hydrocortisone 2.5 % lotion As directed    . Lancets Misc. (ACCU-CHEK FASTCLIX LANCET) KIT AS DIRECTED 1 kit 0  . levothyroxine (SYNTHROID, LEVOTHROID) 112 MCG tablet TAKE ONE TABLET EACH DAY 90 tablet 1  . meloxicam (MOBIC) 15 MG tablet TAKE ONE TABLET EACH DAY 90 tablet 1  . Multiple Vitamins-Minerals (ICAPS AREDS FORMULA PO) Take 1 capsule by mouth daily.    . Na Sulfate-K Sulfate-Mg Sulf SOLN Take 1 kit by mouth once. 354 mL 0  . nateglinide (STARLIX) 60 MG tablet Take 1 tablet (60 mg total) by mouth daily with supper. 60 tablet 11  . olmesartan (BENICAR) 40 MG tablet Take 1 tablet (40 mg total) by mouth daily. 90 tablet 3  . Omega-3 Fatty Acids (FISH OIL) 1200 MG CAPS Take 1 capsule by mouth daily.    Marland Kitchen OVER THE COUNTER MEDICATION Take 1 tablet by mouth daily. Tumeric    . pantoprazole (PROTONIX) 40 MG tablet TAKE ONE TABLET EACH DAY 90 tablet 1  . PROAIR HFA 108 (90 BASE) MCG/ACT inhaler 2 PUFFS FOUR TIMES DAILY AS NEEDED 8.5 g 0  . PROCTOZONE-HC 2.5 % rectal cream APPLY TWICE DAILY RECTALLY 30 g 0  . sitaGLIPtin (JANUVIA) 100 MG tablet Take 0.5 tablets  (50 mg total) by mouth daily. Yearly physical due in May must see MD for refills 45 tablet 3  . tamsulosin (FLOMAX) 0.4 MG CAPS capsule TAKE ONE CAPSULE EACH DAY 90 capsule 1   No current facility-administered medications on file prior to visit.    Review of Systems Constitutional: Negative for other unusual diaphoresis, sweats, appetite or weight changes HENT: Negative for other worsening hearing loss, ear pain, facial swelling, mouth sores or neck stiffness.   Eyes: Negative for other worsening pain, redness or other visual disturbance.  Respiratory: Negative for other stridor or swelling Cardiovascular: Negative for other palpitations or other chest pain  Gastrointestinal: Negative for worsening diarrhea or loose stools, blood in stool, distention or  other pain Genitourinary: Negative for hematuria, flank pain or other change in urine volume.  Musculoskeletal: Negative for myalgias or other joint swelling.  Skin: Negative for other color change, or other wound or worsening drainage.  Neurological: Negative for other syncope or numbness. Hematological: Negative for other adenopathy or swelling Psychiatric/Behavioral: Negative for hallucinations, other worsening agitation, SI, self-injury, or new decreased concentration All other system neg per pt    Objective:   Physical Exam BP 134/70   Pulse 76   Ht 5' 10.5" (1.791 m)   Wt 268 lb (121.6 kg)   SpO2 98%   BMI 37.91 kg/m  VS noted, not ill appearing, obese Constitutional: Pt is oriented to person, place, and time. Appears well-developed and well-nourished, in no significant distress and comfortable Head: Normocephalic and atraumatic  Eyes: Conjunctivae and EOM are normal. Pupils are equal, round, and reactive to light Right Ear: External ear normal without discharge Left Ear: External ear normal without discharge Nose: Nose without discharge or deformity Mouth/Throat: Oropharynx is without other ulcerations and moist  Neck:  Normal range of motion. Neck supple. No JVD present. No tracheal deviation present or significant neck LA or mass Cardiovascular: Normal rate, regular rhythm, normal heart sounds and intact distal pulses.   Pulmonary/Chest: WOB normal and breath sounds without rales or wheezing  Abdominal: Soft. Bowel sounds are normal. NT. No HSM  Musculoskeletal: Normal range of motion. Exhibits no edema Lymphadenopathy: Has no other cervical adenopathy.  Neurological: Pt is alert and oriented to person, place, and time. Pt has normal reflexes. No cranial nerve deficit. Motor grossly intact, Gait intact Skin: Skin is warm and dry. No rash noted or new ulcerations Psychiatric:  Has normal mood and affect. Behavior is normal without agitation No other exam findings  ECG I have personally interpreted today Sinus  Rhythm with RBBB     Assessment & Plan:

## 2016-10-23 NOTE — Patient Instructions (Signed)
Your EKG is ok today  Please continue all other medications as before, and refills have been done if requested.  Please have the pharmacy call with any other refills you may need.  Please continue your efforts at being more active, low cholesterol diet, and weight control.  You are otherwise up to date with prevention measures today.  Please keep your appointments with your specialists as you may have planned  You will be contacted regarding the referral for: Stress test  Please go to the LAB in the Basement (turn left off the elevator) for the tests to be done today  You will be contacted by phone if any changes need to be made immediately.  Otherwise, you will receive a letter about your results with an explanation, but please check with MyChart first.  Please remember to sign up for MyChart if you have not done so, as this will be important to you in the future with finding out test results, communicating by private email, and scheduling acute appointments online when needed.  Please return in 6 months, or sooner if needed, with Lab testing done 3-5 days before

## 2016-10-24 ENCOUNTER — Telehealth: Payer: Self-pay

## 2016-10-24 NOTE — Telephone Encounter (Signed)
Pt has been informed of results and expressed understanding. However he said if the Crestor doesn't agree with him like the Lipitor then he is "done with it". Once I told him his current LDL number vs the one from last year he felt like a 4 point drop was doing good but I explained to him that it was still a lot of room improvement for him not to be at risk for heart disease and stroke.

## 2016-10-24 NOTE — Assessment & Plan Note (Addendum)
Etiology unclear, atypical, ecg reviewed, ok for cxr, also stress test r/o ischemia  In addition to the time spent performing CPE, I spent an additional 15 minutes face to face,in which greater than 50% of this time was spent in counseling and coordination of care for patient's illness as documented, including the differential diagnosis, and further evaluation and management of chest pain, Dm management including monitoring cbgs and medication use, and HTN complications

## 2016-10-24 NOTE — Telephone Encounter (Signed)
-----   Message from Biagio Borg, MD sent at 10/23/2016  7:08 PM EDT ----- Left message on MyChart, pt to cont same tx except  The test results show that your current treatment is OK, except the LDL cholesterol is quite high.  You have not been able to take the lipitor before, but we can still try Crestor 20 mg, as this is often quite tolerable.  This will reduce your risk of future heart disease and stroke.  I will send a prescription, and you should hear from the office as well.Redmond Baseman to please inform pt, I will do rx

## 2016-10-24 NOTE — Telephone Encounter (Signed)
Very good response, thanks

## 2016-10-24 NOTE — Assessment & Plan Note (Signed)
stable overall by history and exam, recent data reviewed with pt, and pt to continue medical treatment as before,  to f/u any worsening symptoms or concerns BP Readings from Last 3 Encounters:  10/23/16 134/70  10/11/16 (!) 148/68  07/12/16 140/64

## 2016-10-24 NOTE — Assessment & Plan Note (Signed)

## 2016-10-24 NOTE — Assessment & Plan Note (Signed)
stable overall by history and exam, recent data reviewed with pt, and pt to continue medical treatment as before,  to f/u any worsening symptoms or concerns Lab Results  Component Value Date   HGBA1C 6.6 (H) 10/23/2016

## 2016-10-31 ENCOUNTER — Telehealth (HOSPITAL_COMMUNITY): Payer: Self-pay | Admitting: Internal Medicine

## 2016-11-01 NOTE — Telephone Encounter (Signed)
10/31/2016 08:43 AM Phone (Outgoing) [name] (Self) [phone] (H)   Left Message - Called pt and lmsg for him to CB to schedule myoview.     By Verdene Rio

## 2016-11-11 ENCOUNTER — Other Ambulatory Visit: Payer: Self-pay | Admitting: Endocrinology

## 2016-11-26 ENCOUNTER — Other Ambulatory Visit: Payer: Self-pay | Admitting: Endocrinology

## 2016-11-26 ENCOUNTER — Other Ambulatory Visit: Payer: Self-pay | Admitting: Internal Medicine

## 2016-12-13 ENCOUNTER — Other Ambulatory Visit: Payer: Self-pay | Admitting: Internal Medicine

## 2016-12-13 DIAGNOSIS — E119 Type 2 diabetes mellitus without complications: Secondary | ICD-10-CM | POA: Diagnosis not present

## 2016-12-13 DIAGNOSIS — H2513 Age-related nuclear cataract, bilateral: Secondary | ICD-10-CM | POA: Diagnosis not present

## 2016-12-13 DIAGNOSIS — H353131 Nonexudative age-related macular degeneration, bilateral, early dry stage: Secondary | ICD-10-CM | POA: Diagnosis not present

## 2016-12-13 DIAGNOSIS — H524 Presbyopia: Secondary | ICD-10-CM | POA: Diagnosis not present

## 2016-12-13 LAB — HM DIABETES EYE EXAM

## 2016-12-23 ENCOUNTER — Encounter: Payer: Self-pay | Admitting: Internal Medicine

## 2016-12-25 DIAGNOSIS — G4733 Obstructive sleep apnea (adult) (pediatric): Secondary | ICD-10-CM | POA: Diagnosis not present

## 2017-02-04 ENCOUNTER — Telehealth (HOSPITAL_COMMUNITY): Payer: Self-pay | Admitting: *Deleted

## 2017-02-04 NOTE — Telephone Encounter (Signed)
Patient given detailed instructions per Myocardial Perfusion Study Information Sheet for the test on 02/07/17 at 0815. Patient notified to arrive 15 minutes early and that it is imperative to arrive on time for appointment to keep from having the test rescheduled.  If you need to cancel or reschedule your appointment, please call the office within 24 hours of your appointment. . Patient verbalized understanding.Jes Costales, Ranae Palms

## 2017-02-07 ENCOUNTER — Ambulatory Visit (HOSPITAL_COMMUNITY): Payer: Medicare Other | Attending: Cardiology

## 2017-02-07 DIAGNOSIS — R079 Chest pain, unspecified: Secondary | ICD-10-CM | POA: Insufficient documentation

## 2017-02-07 DIAGNOSIS — I4510 Unspecified right bundle-branch block: Secondary | ICD-10-CM | POA: Insufficient documentation

## 2017-02-07 DIAGNOSIS — E119 Type 2 diabetes mellitus without complications: Secondary | ICD-10-CM | POA: Insufficient documentation

## 2017-02-07 DIAGNOSIS — I10 Essential (primary) hypertension: Secondary | ICD-10-CM | POA: Insufficient documentation

## 2017-02-07 LAB — MYOCARDIAL PERFUSION IMAGING
CHL CUP MPHR: 147 {beats}/min
CHL CUP NUCLEAR SSS: 2
CHL CUP RESTING HR STRESS: 67 {beats}/min
CSEPED: 6 min
CSEPEW: 7 METS
Exercise duration (sec): 30 s
LHR: 0.38
LV dias vol: 104 mL (ref 62–150)
LV sys vol: 39 mL
NUC STRESS TID: 1.13
Peak HR: 127 {beats}/min
Percent HR: 86 %
SDS: 2
SRS: 0

## 2017-02-07 MED ORDER — TECHNETIUM TC 99M TETROFOSMIN IV KIT
31.4000 | PACK | Freq: Once | INTRAVENOUS | Status: AC | PRN
  Administered 2017-02-07: 31.4 via INTRAVENOUS
  Filled 2017-02-07: qty 32

## 2017-02-07 MED ORDER — TECHNETIUM TC 99M TETROFOSMIN IV KIT
10.6000 | PACK | Freq: Once | INTRAVENOUS | Status: AC | PRN
  Administered 2017-02-07: 10.6 via INTRAVENOUS
  Filled 2017-02-07: qty 11

## 2017-02-14 ENCOUNTER — Encounter: Payer: Self-pay | Admitting: Endocrinology

## 2017-02-14 ENCOUNTER — Ambulatory Visit (INDEPENDENT_AMBULATORY_CARE_PROVIDER_SITE_OTHER): Payer: Medicare Other | Admitting: Endocrinology

## 2017-02-14 VITALS — BP 140/72 | HR 70 | Wt 265.2 lb

## 2017-02-14 DIAGNOSIS — E1122 Type 2 diabetes mellitus with diabetic chronic kidney disease: Secondary | ICD-10-CM | POA: Diagnosis not present

## 2017-02-14 DIAGNOSIS — N182 Chronic kidney disease, stage 2 (mild): Secondary | ICD-10-CM | POA: Diagnosis not present

## 2017-02-14 LAB — POCT GLYCOSYLATED HEMOGLOBIN (HGB A1C): HEMOGLOBIN A1C: 6.6

## 2017-02-14 NOTE — Patient Instructions (Signed)
You can stop taking the nateglinide. Please continue the same other diabetes medications. check your blood sugar once a day.  vary the time of day when you check, between before the 3 meals, and at bedtime.  also check if you have symptoms of your blood sugar being too high or too low.  please keep a record of the readings and bring it to your next appointment here (or you can bring the meter itself).  You can write it on any piece of paper.  please call us sooner if your blood sugar goes below 70, or if you have a lot of readings over 200. Please come back for a follow-up appointment in 4 months.

## 2017-02-14 NOTE — Progress Notes (Signed)
Subjective:    Patient ID: Joseph Villa, male    DOB: 1940/07/30, 76 y.o.   MRN: [id number]  HPI Pt returns for f/u of diabetes mellitus: DM type: 2 Dx'ed: 2725 Complications: polyneuropathy and renal insufficiency. Therapy: 3 oral meds. DKA: never.   Severe hypoglycemia: never.  Pancreatitis: never.  Other: he has never been on insulin, but he has learned how; he did not tolerate metformin-XR (diarrhea); he cannot take pioglitizone, due to edema; renal insufficiency also limits oral rx options. He did not tolerate repaglinide (constipation).   Interval history: He says cbg's are well-controlled.  pt states he feels well in general.   Past Medical History:  Diagnosis Date  . ALLERGIC ASTHMA 11/25/2007  . ALLERGIC RHINITIS 11/25/2007  . Allergy   . Arthritis, lumbar spine ([place]) 04/18/2011  . BIPOLAR DISORDER UNSPECIFIED 10/06/2009  . Blood transfusion without reported diagnosis    s/p goiter surgery age 62   . COLONIC POLYPS, HX OF 10/06/2009  . Degenerative arthritis of hip 04/18/2011  . DJD (degenerative joint disease)   . DM w/o Complication Type II 36/11/4401  . GERD 10/06/2009  . Hemorrhage yrs ago   after goiter surgery, tracheostomy inserted and later removed  . Hemorrhoid   . HYPERLIPIDEMIA 10/06/2009  . HYPERTENSION, BENIGN 07/27/2009  . HYPOTHYROIDISM 10/06/2009  . NEPHROLITHIASIS, HX OF 10/06/2009  . OBSTRUCTIVE SLEEP APNEA 11/25/2007   cpap setting of 9  . Peptic stricture of esophagus   . RBBB (right bundle branch block)   . Sleep apnea    wears c pap    Past Surgical History:  Procedure Laterality Date  . COLONOSCOPY    . GANGLION CYST EXCISION     2-3 cysts removed  . thyroid Goiter surgery    . TONSILLECTOMY    . TOTAL HIP ARTHROPLASTY  06/16/2012   Procedure: TOTAL HIP ARTHROPLASTY ANTERIOR APPROACH;  Surgeon: Mauri Pole, MD;  Location: [facility];  Service: Orthopedics;  Laterality: Left;  . UMBILICAL HERNIA REPAIR  several yrs ago  . UPPER GASTROINTESTINAL  ENDOSCOPY      Social History   Social History  . Marital status: Married    Spouse name: N/A  . Number of children: 3  . Years of education: N/A   Occupational History  . retired    Social History Main Topics  . Smoking status: Former Smoker    Packs/day: 1.50    Years: 15.00    Types: Cigarettes, Pipe    Quit date: 06/23/1977  . Smokeless tobacco: Never Used  . Alcohol use No  . Drug use: No  . Sexual activity: Not on file   Other Topics Concern  . Not on file   Social History Narrative  . No narrative on file    Current Outpatient Prescriptions on File Prior to Visit  Medication Sig Dispense Refill  . ACCU-CHEK FASTCLIX LANCETS MISC USE TWICE DAILY 102 each 5  . ADVAIR DISKUS 250-50 MCG/DOSE AEPB INHALE 1 PUFF AND RINSE MOUTH TWICE DAILY. MAINTENANCE INHALER 180 each 1  . aspirin 81 MG chewable tablet Chew 81 mg by mouth daily.    Marland Kitchen atorvastatin (LIPITOR) 10 MG tablet Take 1 tablet (10 mg total) by mouth daily. 90 tablet 3  . Blood Glucose Monitoring Suppl (ACCU-CHEK AVIVA PLUS) W/DEVICE KIT AS DIRECTED 1 kit 0  . bromocriptine (PARLODEL) 2.5 MG tablet TAKE ONE-HALF TABLET DAILY 45 tablet 2  . carbamazepine (TEGRETOL) 200 MG tablet TAKE 2 TABLETS AT BEDTIME 180  tablet 4  . Cholecalciferol (VITAMIN D3) 2000 UNITS TABS Take 2,000 Units by mouth every morning.     . clobetasol cream (TEMOVATE) 6.01 % Apply 1 application topically 2 (two) times daily as needed. For eczema.    . Coenzyme Q10 (CO Q 10) 100 MG CAPS Take 200 mg by mouth daily.    Marland Kitchen desonide (DESOWEN) 0.05 % cream Apply 1 application topically 2 (two) times daily.    . fluticasone (FLONASE) 50 MCG/ACT nasal spray ONE SPRAY INTO NOSE EVERY DAY 48 g 1  . glucosamine-chondroitin 500-400 MG tablet Take 1 tablet by mouth every morning.     Marland Kitchen glucose blood (ACCU-CHEK AVIVA PLUS) test strip Used to check blood sugars twice a day Dx E11.9 200 each 0  . glucose blood test strip Use to check blood sugar 2 times per  day. DX Code: E11.9 200 each 2  . hydrochlorothiazide (HYDRODIURIL) 25 MG tablet TAKE ONE TABLET EACH DAY 90 tablet 2  . hydrocortisone 2.5 % lotion As directed    . Lancets Misc. (ACCU-CHEK FASTCLIX LANCET) KIT AS DIRECTED 1 kit 0  . levothyroxine (SYNTHROID, LEVOTHROID) 112 MCG tablet TAKE ONE TABLET EACH DAY 90 tablet 1  . meloxicam (MOBIC) 15 MG tablet TAKE ONE TABLET EACH DAY 90 tablet 1  . Multiple Vitamins-Minerals (ICAPS AREDS FORMULA PO) Take 1 capsule by mouth daily.    . Na Sulfate-K Sulfate-Mg Sulf SOLN Take 1 kit by mouth once. 354 mL 0  . olmesartan (BENICAR) 40 MG tablet TAKE ONE TABLET EVERY DAY 90 tablet 3  . Omega-3 Fatty Acids (FISH OIL) 1200 MG CAPS Take 1 capsule by mouth daily.    Marland Kitchen OVER THE COUNTER MEDICATION Take 1 tablet by mouth daily. Tumeric    . pantoprazole (PROTONIX) 40 MG tablet TAKE ONE TABLET EACH DAY 90 tablet 1  . PROAIR HFA 108 (90 BASE) MCG/ACT inhaler 2 PUFFS FOUR TIMES DAILY AS NEEDED 8.5 g 0  . PROCTOZONE-HC 2.5 % rectal cream APPLY TWICE DAILY RECTALLY 30 g 0  . rosuvastatin (CRESTOR) 20 MG tablet Take 1 tablet (20 mg total) by mouth daily. 90 tablet 3  . sitaGLIPtin (JANUVIA) 100 MG tablet Take 0.5 tablets (50 mg total) by mouth daily. Yearly physical due in May must see MD for refills 45 tablet 3  . tamsulosin (FLOMAX) 0.4 MG CAPS capsule TAKE ONE CAPSULE EACH DAY 90 capsule 1   No current facility-administered medications on file prior to visit.     Allergies  Allergen Reactions  . Cefuroxime Axetil Anaphylaxis    REACTION: anaphylaxis-ceftin   . Cefprozil     REACTION: unknown  . Lipitor [Atorvastatin]     myalgiaas  . Metformin And Related   . Rabeprazole Sodium Nausea Only    REACTION: nausea    Family History  Problem Relation Age of Onset  . Throat cancer Mother   . Alcohol abuse Brother   . Diabetes Brother   . Drug abuse Daughter   . Alcoholism Unknown        uncle  . Breast cancer Father   . Colon cancer Neg Hx   .  Colon polyps Neg Hx   . Esophageal cancer Neg Hx   . Rectal cancer Neg Hx   . Stomach cancer Neg Hx     BP 140/72   Pulse 70   Wt 265 lb 3.2 oz (120.3 kg)   SpO2 95%   BMI 37.51 kg/m    Review of Systems  He denies hypoglycemia.     Objective:   Physical Exam VITAL SIGNS:  See vs page GENERAL: no distress Pulses: foot pulses are intact bilaterally.   MSK: no deformity of the feet or ankles.  CV: no edema of the legs or ankles Skin:  no ulcer on the feet or ankles.  normal color and temp on the feet and ankles Neuro: sensation is intact to touch on the feet and ankles.    Lab Results  Component Value Date   HGBA1C 6.6 02/14/2017      Assessment & Plan:  Type 2 DM, with renal insuff: Overcontrolled, given advanced age.   Patient Instructions  You can stop taking the nateglinide. Please continue the same other diabetes medications. check your blood sugar once a day.  vary the time of day when you check, between before the 3 meals, and at bedtime.  also check if you have symptoms of your blood sugar being too high or too low.  please keep a record of the readings and bring it to your next appointment here (or you can bring the meter itself).  You can write it on any piece of paper.  please call us sooner if your blood sugar goes below 70, or if you have a lot of readings over 200. Please come back for a follow-up appointment in 4 months.

## 2017-03-17 ENCOUNTER — Other Ambulatory Visit: Payer: Self-pay | Admitting: Internal Medicine

## 2017-03-19 ENCOUNTER — Ambulatory Visit (INDEPENDENT_AMBULATORY_CARE_PROVIDER_SITE_OTHER): Payer: Medicare Other

## 2017-03-19 DIAGNOSIS — Z23 Encounter for immunization: Secondary | ICD-10-CM | POA: Diagnosis not present

## 2017-03-25 ENCOUNTER — Other Ambulatory Visit: Payer: Self-pay | Admitting: Internal Medicine

## 2017-04-12 ENCOUNTER — Other Ambulatory Visit: Payer: Self-pay | Admitting: Internal Medicine

## 2017-04-15 ENCOUNTER — Other Ambulatory Visit: Payer: Self-pay | Admitting: Internal Medicine

## 2017-04-19 ENCOUNTER — Encounter: Payer: Self-pay | Admitting: Endocrinology

## 2017-05-02 ENCOUNTER — Encounter: Payer: Self-pay | Admitting: Internal Medicine

## 2017-05-02 ENCOUNTER — Ambulatory Visit: Payer: Medicare Other | Admitting: Internal Medicine

## 2017-05-02 ENCOUNTER — Other Ambulatory Visit (INDEPENDENT_AMBULATORY_CARE_PROVIDER_SITE_OTHER): Payer: Medicare Other

## 2017-05-02 VITALS — BP 136/58 | HR 84 | Temp 98.2°F | Ht 70.5 in | Wt 265.0 lb

## 2017-05-02 DIAGNOSIS — I10 Essential (primary) hypertension: Secondary | ICD-10-CM | POA: Diagnosis not present

## 2017-05-02 DIAGNOSIS — M545 Low back pain: Secondary | ICD-10-CM

## 2017-05-02 DIAGNOSIS — E785 Hyperlipidemia, unspecified: Secondary | ICD-10-CM | POA: Diagnosis not present

## 2017-05-02 DIAGNOSIS — N182 Chronic kidney disease, stage 2 (mild): Secondary | ICD-10-CM

## 2017-05-02 DIAGNOSIS — N183 Chronic kidney disease, stage 3 unspecified: Secondary | ICD-10-CM

## 2017-05-02 DIAGNOSIS — G8929 Other chronic pain: Secondary | ICD-10-CM

## 2017-05-02 DIAGNOSIS — E1122 Type 2 diabetes mellitus with diabetic chronic kidney disease: Secondary | ICD-10-CM | POA: Diagnosis not present

## 2017-05-02 DIAGNOSIS — E039 Hypothyroidism, unspecified: Secondary | ICD-10-CM | POA: Diagnosis not present

## 2017-05-02 DIAGNOSIS — Z0000 Encounter for general adult medical examination without abnormal findings: Secondary | ICD-10-CM

## 2017-05-02 LAB — HEPATIC FUNCTION PANEL
ALT: 23 U/L (ref 0–53)
AST: 20 U/L (ref 0–37)
Albumin: 4.2 g/dL (ref 3.5–5.2)
Alkaline Phosphatase: 63 U/L (ref 39–117)
Bilirubin, Direct: 0.1 mg/dL (ref 0.0–0.3)
Total Bilirubin: 0.4 mg/dL (ref 0.2–1.2)
Total Protein: 7.2 g/dL (ref 6.0–8.3)

## 2017-05-02 LAB — BASIC METABOLIC PANEL
BUN: 36 mg/dL — ABNORMAL HIGH (ref 6–23)
CALCIUM: 9.5 mg/dL (ref 8.4–10.5)
CHLORIDE: 102 meq/L (ref 96–112)
CO2: 25 meq/L (ref 19–32)
Creatinine, Ser: 1.48 mg/dL (ref 0.40–1.50)
GFR: 49.37 mL/min — ABNORMAL LOW (ref >60.00)
Glucose, Bld: 231 mg/dL — ABNORMAL HIGH (ref 70–99)
Potassium: 4.4 mEq/L (ref 3.5–5.1)
SODIUM: 136 meq/L (ref 135–145)

## 2017-05-02 LAB — LIPID PANEL
Cholesterol: 245 mg/dL — ABNORMAL HIGH (ref 0–200)
HDL: 39.8 mg/dL (ref >39.00)
Total CHOL/HDL Ratio: 6
Triglycerides: 432 mg/dL — ABNORMAL HIGH (ref 0.0–149.0)

## 2017-05-02 LAB — LDL CHOLESTEROL, DIRECT: Direct LDL: 160 mg/dL

## 2017-05-02 MED ORDER — TRAMADOL HCL 50 MG PO TABS: 50.0000 mg | ORAL_TABLET | Freq: Three times a day (TID) | ORAL | 2 refills | Status: DC | PRN

## 2017-05-02 NOTE — Patient Instructions (Addendum)
Please take all new medication as prescribed - the tramadol as needed for pain  Please continue all other medications as before, and refills have been done if requested.  Please have the pharmacy call with any other refills you may need.  Please continue your efforts at being more active, low cholesterol diet, and weight control.  Please keep your appointments with your specialists as you may have planned  Please go to the LAB in the Basement (turn left off the elevator) for the tests to be done today  You will be contacted by phone if any changes need to be made immediately.  Otherwise, you will receive a letter about your results with an explanation, but please check with MyChart first.  Please remember to sign up for MyChart if you have not done so, as this will be important to you in the future with finding out test results, communicating by private email, and scheduling acute appointments online when needed.  Please return in 6 months, or sooner if needed, with Lab testing done 3-5 days before

## 2017-05-02 NOTE — Progress Notes (Signed)
Subjective:    Patient ID: Joseph Villa, male    DOB: [birth year], 76 y.o.   MRN: [id number]  HPI  Here to f/u; overall doing ok,  Pt denies chest pain, increasing sob or doe, wheezing, orthopnea, PND, increased LE swelling, palpitations, dizziness or syncope.  Pt denies new neurological symptoms such as new headache, or facial or extremity weakness or numbness.  Pt denies polydipsia, polyuria, or low sugar episode.  Pt states overall good compliance with meds, mostly trying to follow appropriate diet, with wt overall stable,  but little exercise however.  Could not tolerate crestor due to myalgias. - delcines further statin tx. Pt continues to have mild worseinng overall recurring LBP, bowel or bladder change, fever, wt loss,  worsening LE pain/numbness/weakness, gait change or falls, but is definitely affecting the activity level, exercise and even sleep.  Also has torn right rot cuff and left shoulder DJD per Dr Yvonne Kendall.  Denies hyper or hypo thyroid symptoms such as voice, skin or hair change. Past Medical History:  Diagnosis Date  . ALLERGIC ASTHMA 11/25/2007  . ALLERGIC RHINITIS 11/25/2007  . Allergy   . Arthritis, lumbar spine 04/18/2011  . BIPOLAR DISORDER UNSPECIFIED 10/06/2009  . Blood transfusion without reported diagnosis    s/p goiter surgery age 62   . COLONIC POLYPS, HX OF 10/06/2009  . Degenerative arthritis of hip 04/18/2011  . DJD (degenerative joint disease)   . DM w/o Complication Type II 84/11/6597  . GERD 10/06/2009  . Hemorrhage yrs ago   after goiter surgery, tracheostomy inserted and later removed  . Hemorrhoid   . HYPERLIPIDEMIA 10/06/2009  . HYPERTENSION, BENIGN 07/27/2009  . HYPOTHYROIDISM 10/06/2009  . NEPHROLITHIASIS, HX OF 10/06/2009  . OBSTRUCTIVE SLEEP APNEA 11/25/2007   cpap setting of 9  . Peptic stricture of esophagus   . RBBB (right bundle branch block)   . Sleep apnea    wears c pap   Past Surgical History:  Procedure Laterality Date  . COLONOSCOPY      . GANGLION CYST EXCISION     2-3 cysts removed  . thyroid Goiter surgery    . TONSILLECTOMY    . TOTAL HIP ARTHROPLASTY  06/16/2012   Procedure: TOTAL HIP ARTHROPLASTY ANTERIOR APPROACH;  Surgeon: Mauri Pole, MD;  Location: [facility];  Service: Orthopedics;  Laterality: Left;  . UMBILICAL HERNIA REPAIR  several yrs ago  . UPPER GASTROINTESTINAL ENDOSCOPY      reports that he quit smoking about 39 years ago. His smoking use included cigarettes and pipe. He has a 22.50 pack-year smoking history. he has never used smokeless tobacco. He reports that he does not drink alcohol or use drugs. family history includes Alcohol abuse in his brother; Alcoholism in his unknown relative; Breast cancer in his father; Diabetes in his brother; Drug abuse in his daughter; Throat cancer in his mother. Allergies  Allergen Reactions  . Cefuroxime Axetil Anaphylaxis    REACTION: anaphylaxis-ceftin   . Cefprozil     REACTION: unknown  . Crestor [Rosuvastatin Calcium]   . Lipitor [Atorvastatin]     myalgiaas  . Metformin And Related   . Rabeprazole Sodium Nausea Only    REACTION: nausea   Current Outpatient Medications on File Prior to Visit  Medication Sig Dispense Refill  . ACCU-CHEK FASTCLIX LANCETS MISC USE TWICE DAILY 102 each 5  . aspirin 81 MG chewable tablet Chew 81 mg by mouth daily.    Marland Kitchen atorvastatin (LIPITOR) 10 MG tablet Take 1  tablet (10 mg total) by mouth daily. 90 tablet 3  . Blood Glucose Monitoring Suppl (ACCU-CHEK AVIVA PLUS) W/DEVICE KIT AS DIRECTED 1 kit 0  . bromocriptine (PARLODEL) 2.5 MG tablet TAKE ONE-HALF TABLET DAILY 45 tablet 2  . carbamazepine (TEGRETOL) 200 MG tablet TAKE 2 TABLETS AT BEDTIME 180 tablet 4  . Cholecalciferol (VITAMIN D3) 2000 UNITS TABS Take 2,000 Units by mouth every morning.     . clobetasol cream (TEMOVATE) 2.03 % Apply 1 application topically 2 (two) times daily as needed. For eczema.    . Coenzyme Q10 (CO Q 10) 100 MG CAPS Take 200 mg by mouth daily.     Marland Kitchen desonide (DESOWEN) 0.05 % cream Apply 1 application topically 2 (two) times daily.    . fluticasone (FLONASE) 50 MCG/ACT nasal spray ONE SPRAY INTO NOSE EVERY DAY 48 g 1  . glucosamine-chondroitin 500-400 MG tablet Take 1 tablet by mouth every morning.     Marland Kitchen glucose blood (ACCU-CHEK AVIVA PLUS) test strip Used to check blood sugars twice a day Dx E11.9 200 each 0  . glucose blood test strip Use to check blood sugar 2 times per day. DX Code: E11.9 200 each 2  . hydrochlorothiazide (HYDRODIURIL) 25 MG tablet TAKE ONE TABLET EACH DAY 90 tablet 1  . hydrocortisone 2.5 % lotion As directed    . Lancets Misc. (ACCU-CHEK FASTCLIX LANCET) KIT AS DIRECTED 1 kit 0  . levothyroxine (SYNTHROID, LEVOTHROID) 112 MCG tablet TAKE ONE TABLET EVERY DAY 90 tablet 1  . meloxicam (MOBIC) 15 MG tablet TAKE ONE TABLET EVERY DAY 90 tablet 1  . Multiple Vitamins-Minerals (ICAPS AREDS FORMULA PO) Take 1 capsule by mouth daily.    . Na Sulfate-K Sulfate-Mg Sulf SOLN Take 1 kit by mouth once. 354 mL 0  . olmesartan (BENICAR) 40 MG tablet TAKE ONE TABLET EVERY DAY 90 tablet 3  . Omega-3 Fatty Acids (FISH OIL) 1200 MG CAPS Take 1 capsule by mouth daily.    Marland Kitchen OVER THE COUNTER MEDICATION Take 1 tablet by mouth daily. Tumeric    . pantoprazole (PROTONIX) 40 MG tablet TAKE ONE TABLET EACH DAY 90 tablet 1  . PROAIR HFA 108 (90 BASE) MCG/ACT inhaler 2 PUFFS FOUR TIMES DAILY AS NEEDED 8.5 g 0  . PROCTOZONE-HC 2.5 % rectal cream APPLY TWICE DAILY RECTALLY 30 g 0  . sitaGLIPtin (JANUVIA) 100 MG tablet Take 0.5 tablets (50 mg total) by mouth daily. Yearly physical due in May must see MD for refills 45 tablet 3  . tamsulosin (FLOMAX) 0.4 MG CAPS capsule TAKE ONE CAPSULE EACH DAY 90 capsule 1  . ADVAIR DISKUS 250-50 MCG/DOSE AEPB INHALE 1 PUFF AND RINSE MOUTH TWICE DAILY. MAINTENANCE INHALER (Patient not taking: Reported on 05/02/2017) 180 each 1  . rosuvastatin (CRESTOR) 20 MG tablet Take 1 tablet (20 mg total) by mouth daily.  (Patient not taking: Reported on 05/02/2017) 90 tablet 3   No current facility-administered medications on file prior to visit.    Review of Systems  Constitutional: Negative for other unusual diaphoresis or sweats HENT: Negative for ear discharge or swelling Eyes: Negative for other worsening visual disturbances Respiratory: Negative for stridor or other swelling  Gastrointestinal: Negative for worsening distension or other blood Genitourinary: Negative for retention or other urinary change Musculoskeletal: Negative for other MSK pain or swelling Skin: Negative for color change or other new lesions Neurological: Negative for worsening tremors and other numbness  Psychiatric/Behavioral: Negative for worsening agitation or other fatigue All other  system neg per pt    Objective:   Physical Exam BP (!) 136/58 (BP Location: Left Arm, Patient Position: Sitting, Cuff Size: Large)   Pulse 84   Temp 98.2 F (36.8 C) (Oral)   Ht 5' 10.5" (1.791 m)   Wt 265 lb (120.2 kg)   SpO2 94%   BMI 37.49 kg/m  VS noted,  Constitutional: Pt appears in NAD HENT: Head: NCAT.  Right Ear: External ear normal.  Left Ear: External ear normal.  Eyes: . Pupils are equal, round, and reactive to light. Conjunctivae and EOM are normal Nose: without d/c or deformity Neck: Neck supple. Gross normal ROM Cardiovascular: Normal rate and regular rhythm.   Pulmonary/Chest: Effort normal and breath sounds without rales or wheezing.  Spine:  Diffuse mild tender midline lumbar without paravertebral tender Neurological: Pt is alert. At baseline orientation, motor grossly intact Skin: Skin is warm. No rashes, other new lesions, no LE edema Psychiatric: Pt behavior is normal without agitation  No other exam findings    Assessment & Plan:

## 2017-05-03 ENCOUNTER — Encounter: Payer: Self-pay | Admitting: Internal Medicine

## 2017-05-03 NOTE — Assessment & Plan Note (Addendum)
Uncontrolled, o/w stable overall by history and exam, recent data reviewed with pt, and pt to continue medical treatment as before,  to f/u any worsening symptoms or concerns, pt declines further statin trial

## 2017-05-03 NOTE — Assessment & Plan Note (Signed)
stable overall by history and exam, recent data reviewed with pt, and pt to continue medical treatment as before,  to f/u any worsening symptoms or concerns BP Readings from Last 3 Encounters:  05/02/17 (!) 136/58  02/14/17 140/72  10/23/16 134/70

## 2017-05-03 NOTE — Assessment & Plan Note (Signed)
stable overall by history and exam, recent data reviewed with pt, and pt to continue medical treatment as before,  to f/u any worsening symptoms or concerns, for f/u lab, consider renal referral

## 2017-05-03 NOTE — Assessment & Plan Note (Signed)
Lab Results  Component Value Date   TSH 3.08 10/23/2016  stable overall by history and exam, recent data reviewed with pt, and pt to continue medical treatment as before,  to f/u any worsening symptoms or concerns

## 2017-05-03 NOTE — Assessment & Plan Note (Signed)
stable overall by history and exam, recent data reviewed with pt, and pt to continue medical treatment as before,  to f/u any worsening symptoms or concerns Lab Results  Component Value Date   HGBA1C 6.6 02/14/2017

## 2017-05-03 NOTE — Assessment & Plan Note (Signed)
Likely related to underlying DJD,DDD - for tramadol prn

## 2017-05-07 DIAGNOSIS — H16041 Marginal corneal ulcer, right eye: Secondary | ICD-10-CM | POA: Diagnosis not present

## 2017-05-14 DIAGNOSIS — H16041 Marginal corneal ulcer, right eye: Secondary | ICD-10-CM | POA: Diagnosis not present

## 2017-05-28 ENCOUNTER — Other Ambulatory Visit: Payer: Self-pay | Admitting: Internal Medicine

## 2017-06-09 DIAGNOSIS — L570 Actinic keratosis: Secondary | ICD-10-CM | POA: Diagnosis not present

## 2017-06-09 DIAGNOSIS — L300 Nummular dermatitis: Secondary | ICD-10-CM | POA: Diagnosis not present

## 2017-06-09 DIAGNOSIS — Z85828 Personal history of other malignant neoplasm of skin: Secondary | ICD-10-CM | POA: Diagnosis not present

## 2017-06-11 ENCOUNTER — Other Ambulatory Visit: Payer: Self-pay | Admitting: Internal Medicine

## 2017-06-11 ENCOUNTER — Telehealth: Payer: Self-pay

## 2017-06-11 DIAGNOSIS — N183 Chronic kidney disease, stage 3 unspecified: Secondary | ICD-10-CM

## 2017-06-11 NOTE — Telephone Encounter (Signed)
Ok this is done 

## 2017-06-11 NOTE — Telephone Encounter (Signed)
Copied from [place] [id number]. Topic: General - Other >> Jun 11, 2017  9:31 AM Carolyn Stare wrote:  Pt wife call to say Dr Jenny Reichmann had suggested that pt see a kidney doctor.They call today to ask for that referral to [place] kidney doctor   [phone]

## 2017-06-16 ENCOUNTER — Ambulatory Visit: Payer: Medicare Other | Admitting: Endocrinology

## 2017-06-20 ENCOUNTER — Encounter: Payer: Self-pay | Admitting: Endocrinology

## 2017-06-20 ENCOUNTER — Ambulatory Visit: Payer: Medicare Other | Admitting: Endocrinology

## 2017-06-20 VITALS — BP 162/90

## 2017-06-20 DIAGNOSIS — E1122 Type 2 diabetes mellitus with diabetic chronic kidney disease: Secondary | ICD-10-CM

## 2017-06-20 DIAGNOSIS — N182 Chronic kidney disease, stage 2 (mild): Secondary | ICD-10-CM

## 2017-06-20 LAB — POCT GLYCOSYLATED HEMOGLOBIN (HGB A1C): HEMOGLOBIN A1C: 7.9

## 2017-06-20 MED ORDER — NATEGLINIDE 60 MG PO TABS: 30.0000 mg | ORAL_TABLET | Freq: Three times a day (TID) | ORAL | 11 refills | Status: DC

## 2017-06-20 MED ORDER — SITAGLIPTIN PHOSPHATE 100 MG PO TABS: 50.0000 mg | ORAL_TABLET | Freq: Every day | ORAL | 3 refills | Status: DC

## 2017-06-20 NOTE — Progress Notes (Signed)
Subjective:    Patient ID: Joseph Villa, male    DOB: [birth year], 77 y.o.   MRN: [id number]  HPI Pt returns for f/u of diabetes mellitus: DM type: 2 Dx'ed: 8786 Complications: polyneuropathy and renal insufficiency. Therapy: 2 oral meds. DKA: never.   Severe hypoglycemia: never.  Pancreatitis: never.  Other: he has never been on insulin, but he has learned how; he did not tolerate metformin-XR (diarrhea); he cannot take pioglitizone, due to edema; renal insufficiency also limits oral rx options. He did not tolerate repaglinide (constipation).   Interval history: He says cbg's are well-controlled.  pt states he feels well in general.   Past Medical History:  Diagnosis Date  . ALLERGIC ASTHMA 11/25/2007  . ALLERGIC RHINITIS 11/25/2007  . Allergy   . Arthritis, lumbar spine 04/18/2011  . BIPOLAR DISORDER UNSPECIFIED 10/06/2009  . Blood transfusion without reported diagnosis    s/p goiter surgery age 81   . COLONIC POLYPS, HX OF 10/06/2009  . Degenerative arthritis of hip 04/18/2011  . DJD (degenerative joint disease)   . DM w/o Complication Type II 76/12/2092  . GERD 10/06/2009  . Hemorrhage yrs ago   after goiter surgery, tracheostomy inserted and later removed  . Hemorrhoid   . HYPERLIPIDEMIA 10/06/2009  . HYPERTENSION, BENIGN 07/27/2009  . HYPOTHYROIDISM 10/06/2009  . NEPHROLITHIASIS, HX OF 10/06/2009  . OBSTRUCTIVE SLEEP APNEA 11/25/2007   cpap setting of 9  . Peptic stricture of esophagus   . RBBB (right bundle branch block)   . Sleep apnea    wears c pap    Past Surgical History:  Procedure Laterality Date  . COLONOSCOPY    . GANGLION CYST EXCISION     2-3 cysts removed  . thyroid Goiter surgery    . TONSILLECTOMY    . TOTAL HIP ARTHROPLASTY  06/16/2012   Procedure: TOTAL HIP ARTHROPLASTY ANTERIOR APPROACH;  Surgeon: Mauri Pole, MD;  Location: [facility];  Service: Orthopedics;  Laterality: Left;  . UMBILICAL HERNIA REPAIR  several yrs ago  . UPPER GASTROINTESTINAL  ENDOSCOPY      Social History   Socioeconomic History  . Marital status: Married    Spouse name: Not on file  . Number of children: 3  . Years of education: Not on file  . Highest education level: Not on file  Social Needs  . Financial resource strain: Not on file  . Food insecurity - worry: Not on file  . Food insecurity - inability: Not on file  . Transportation needs - medical: Not on file  . Transportation needs - non-medical: Not on file  Occupational History  . Occupation: retired  Tobacco Use  . Smoking status: Former Smoker    Packs/day: 1.50    Years: 15.00    Pack years: 22.50    Types: Cigarettes, Pipe    Last attempt to quit: 06/23/1977    Years since quitting: 40.0  . Smokeless tobacco: Never Used  Substance and Sexual Activity  . Alcohol use: No    Alcohol/week: 0.0 oz  . Drug use: No  . Sexual activity: Not on file  Other Topics Concern  . Not on file  Social History Narrative  . Not on file    Current Outpatient Medications on File Prior to Visit  Medication Sig Dispense Refill  . ACCU-CHEK AVIVA PLUS test strip CHECK BLOOD GLUCOSE (SUGAR) TWICE DAILY 200 each 1  . ACCU-CHEK FASTCLIX LANCETS MISC USE TWICE DAILY 102 each 5  . ADVAIR DISKUS  250-50 MCG/DOSE AEPB INHALE 1 PUFF AND RINSE MOUTH TWICE DAILY. MAINTENANCE INHALER (Patient not taking: Reported on 05/02/2017) 180 each 1  . aspirin 81 MG chewable tablet Chew 81 mg by mouth daily.    Marland Kitchen atorvastatin (LIPITOR) 10 MG tablet Take 1 tablet (10 mg total) by mouth daily. 90 tablet 3  . Blood Glucose Monitoring Suppl (ACCU-CHEK AVIVA PLUS) W/DEVICE KIT AS DIRECTED 1 kit 0  . bromocriptine (PARLODEL) 2.5 MG tablet TAKE ONE-HALF TABLET DAILY 45 tablet 2  . carbamazepine (TEGRETOL) 200 MG tablet TAKE 2 TABLETS AT BEDTIME 180 tablet 4  . Cholecalciferol (VITAMIN D3) 2000 UNITS TABS Take 2,000 Units by mouth every morning.     . clobetasol cream (TEMOVATE) 1.19 % Apply 1 application topically 2 (two) times  daily as needed. For eczema.    . Coenzyme Q10 (CO Q 10) 100 MG CAPS Take 200 mg by mouth daily.    Marland Kitchen desonide (DESOWEN) 0.05 % cream Apply 1 application topically 2 (two) times daily.    . fluticasone (FLONASE) 50 MCG/ACT nasal spray ONE SPRAY INTO NOSE EVERY DAY 48 g 1  . glucosamine-chondroitin 500-400 MG tablet Take 1 tablet by mouth every morning.     Marland Kitchen glucose blood (ACCU-CHEK AVIVA PLUS) test strip Used to check blood sugars twice a day Dx E11.9 200 each 0  . glucose blood test strip Use to check blood sugar 2 times per day. DX Code: E11.9 200 each 2  . hydrochlorothiazide (HYDRODIURIL) 25 MG tablet TAKE ONE TABLET EACH DAY 90 tablet 1  . hydrocortisone 2.5 % lotion As directed    . Lancets Misc. (ACCU-CHEK FASTCLIX LANCET) KIT AS DIRECTED 1 kit 0  . levothyroxine (SYNTHROID, LEVOTHROID) 112 MCG tablet TAKE ONE TABLET EVERY DAY 90 tablet 1  . meloxicam (MOBIC) 15 MG tablet TAKE ONE TABLET EVERY DAY 90 tablet 1  . Multiple Vitamins-Minerals (ICAPS AREDS FORMULA PO) Take 1 capsule by mouth daily.    . Na Sulfate-K Sulfate-Mg Sulf SOLN Take 1 kit by mouth once. 354 mL 0  . olmesartan (BENICAR) 40 MG tablet TAKE ONE TABLET EVERY DAY 90 tablet 3  . Omega-3 Fatty Acids (FISH OIL) 1200 MG CAPS Take 1 capsule by mouth daily.    Marland Kitchen OVER THE COUNTER MEDICATION Take 1 tablet by mouth daily. Tumeric    . pantoprazole (PROTONIX) 40 MG tablet TAKE ONE TABLET EACH DAY 90 tablet 1  . PROAIR HFA 108 (90 BASE) MCG/ACT inhaler 2 PUFFS FOUR TIMES DAILY AS NEEDED 8.5 g 0  . PROCTOZONE-HC 2.5 % rectal cream APPLY TWICE DAILY RECTALLY 30 g 0  . rosuvastatin (CRESTOR) 20 MG tablet Take 1 tablet (20 mg total) by mouth daily. (Patient not taking: Reported on 05/02/2017) 90 tablet 3  . tamsulosin (FLOMAX) 0.4 MG CAPS capsule TAKE ONE CAPSULE EACH DAY 90 capsule 1  . traMADol (ULTRAM) 50 MG tablet Take 1 tablet (50 mg total) by mouth every 8 (eight) hours as needed. 90 tablet 2   No current facility-administered  medications on file prior to visit.     Allergies  Allergen Reactions  . Cefuroxime Axetil Anaphylaxis    REACTION: anaphylaxis-ceftin   . Cefprozil     REACTION: unknown  . Crestor [Rosuvastatin Calcium]   . Lipitor [Atorvastatin]     myalgiaas  . Metformin And Related   . Rabeprazole Sodium Nausea Only    REACTION: nausea    Family History  Problem Relation Age of Onset  . Throat  cancer Mother   . Alcohol abuse Brother   . Diabetes Brother   . Drug abuse Daughter   . Alcoholism Unknown        uncle  . Breast cancer Father   . Colon cancer Neg Hx   . Colon polyps Neg Hx   . Esophageal cancer Neg Hx   . Rectal cancer Neg Hx   . Stomach cancer Neg Hx     BP (!) 162/90 (BP Location: Left Arm, Patient Position: Sitting, Cuff Size: Normal)    Review of Systems He denies hypoglycemia.      Objective:   Physical Exam VITAL SIGNS:  See vs page GENERAL: no distress Pulses: foot pulses are intact bilaterally.   MSK: no deformity of the feet or ankles.  CV: no edema of the legs or ankles Skin:  no ulcer on the feet or ankles.  normal color and temp on the feet and ankles Neuro: sensation is intact to touch on the feet and ankles.   Lab Results  Component Value Date   HGBA1C 7.9 06/20/2017   Lab Results  Component Value Date   CREATININE 1.48 05/02/2017   BUN 36 (H) 05/02/2017   NA 136 05/02/2017   K 4.4 05/02/2017   CL 102 05/02/2017   CO2 25 05/02/2017      Assessment & Plan:  Type 2 DM: worse Renal insuff: we'll have to rx low dosage meds.   Patient Instructions  I have sent a prescription to your pharmacy, to resume the nateglinide, 1/2 pill 3 times a day (just before each meal). Please continue the same other diabetes medications. check your blood sugar once a day.  vary the time of day when you check, between before the 3 meals, and at bedtime.  also check if you have symptoms of your blood sugar being too high or too low.  please keep a record of  the readings and bring it to your next appointment here (or you can bring the meter itself).  You can write it on any piece of paper.  please call us sooner if your blood sugar goes below 70, or if you have a lot of readings over 200. Please come back for a follow-up appointment in 4 months.

## 2017-06-20 NOTE — Patient Instructions (Addendum)
I have sent a prescription to your pharmacy, to resume the nateglinide, 1/2 pill 3 times a day (just before each meal). Please continue the same other diabetes medications. check your blood sugar once a day.  vary the time of day when you check, between before the 3 meals, and at bedtime.  also check if you have symptoms of your blood sugar being too high or too low.  please keep a record of the readings and bring it to your next appointment here (or you can bring the meter itself).  You can write it on any piece of paper.  please call us sooner if your blood sugar goes below 70, or if you have a lot of readings over 200. Please come back for a follow-up appointment in 4 months.

## 2017-07-01 ENCOUNTER — Telehealth: Payer: Self-pay | Admitting: Internal Medicine

## 2017-07-01 DIAGNOSIS — N183 Chronic kidney disease, stage 3 unspecified: Secondary | ICD-10-CM

## 2017-07-01 NOTE — Telephone Encounter (Signed)
Copied from [place] [id number]. Topic: Referral - Request >> Jul 01, 2017  2:58 PM [name] wrote: Created in error .

## 2017-07-01 NOTE — Telephone Encounter (Signed)
Copied from [place]. Topic: Referral - Request >> Jul 01, 2017  2:59 PM [name] wrote: Reason for CRM: pt requesting referral to liver doctor.

## 2017-07-02 NOTE — Telephone Encounter (Signed)
Ok referral done 

## 2017-07-02 NOTE — Telephone Encounter (Signed)
After speaking with the pt, the original msg was incorrect, he stated that he needs a referral to the nephrologist since he is in stage 2 kidney failure. He would like to see a specialist and be further evaluated due to a lot of kidney issues that runs in his family. He would like to be seen at [facility] with Dr. Roland Earl. Please advise.

## 2017-07-02 NOTE — Addendum Note (Signed)
Addended by: Biagio Borg on: 07/02/2017 11:19 AM   Modules accepted: Orders

## 2017-07-02 NOTE — Telephone Encounter (Signed)
Please clarify the reason, as I see no need based on his recent history, and last Liver tests were normal nov 2018

## 2017-07-14 ENCOUNTER — Other Ambulatory Visit: Payer: Self-pay | Admitting: Endocrinology

## 2017-08-11 DIAGNOSIS — G4733 Obstructive sleep apnea (adult) (pediatric): Secondary | ICD-10-CM | POA: Diagnosis not present

## 2017-09-09 ENCOUNTER — Other Ambulatory Visit: Payer: Self-pay | Admitting: Internal Medicine

## 2017-09-15 ENCOUNTER — Other Ambulatory Visit: Payer: Self-pay | Admitting: Internal Medicine

## 2017-10-10 ENCOUNTER — Encounter: Payer: Self-pay | Admitting: Endocrinology

## 2017-10-10 ENCOUNTER — Ambulatory Visit: Payer: Medicare Other | Admitting: Endocrinology

## 2017-10-10 ENCOUNTER — Other Ambulatory Visit: Payer: Self-pay | Admitting: Internal Medicine

## 2017-10-10 VITALS — BP 142/68 | HR 67 | Wt 256.6 lb

## 2017-10-10 DIAGNOSIS — E1122 Type 2 diabetes mellitus with diabetic chronic kidney disease: Secondary | ICD-10-CM | POA: Diagnosis not present

## 2017-10-10 DIAGNOSIS — N182 Chronic kidney disease, stage 2 (mild): Secondary | ICD-10-CM

## 2017-10-10 LAB — POCT GLYCOSYLATED HEMOGLOBIN (HGB A1C): HEMOGLOBIN A1C: 6.8

## 2017-10-10 MED ORDER — DOXYCYCLINE HYCLATE 100 MG PO TABS: 100.0000 mg | ORAL_TABLET | Freq: Two times a day (BID) | ORAL | 0 refills | Status: DC

## 2017-10-10 NOTE — Patient Instructions (Addendum)
I have sent a prescription to your pharmacy, for an antibiotic pill.   You will get better much faster if you elevate your right foot above the rest of your body. Please continue the same diabetes medications. check your blood sugar once a day.  vary the time of day when you check, between before the 3 meals, and at bedtime.  also check if you have symptoms of your blood sugar being too high or too low.  please keep a record of the readings and bring it to your next appointment here (or you can bring the meter itself).  You can write it on any piece of paper.  please call us sooner if your blood sugar goes below 70, or if you have a lot of readings over 200. Please come back for a follow-up appointment in 4 months.

## 2017-10-10 NOTE — Progress Notes (Signed)
Subjective:    Patient ID: Joseph Villa, male    DOB: 1941/04/11, 77 y.o.   MRN: [id number]  HPI Pt returns for f/u of diabetes mellitus:  DM type: 2 Dx'ed: 2007.   Complications: polyneuropathy and renal insufficiency.   Therapy: 3 oral meds.  DKA: never.   Severe hypoglycemia: never.  Pancreatitis: never.  Other: he has never been on insulin, but he has learned how; he did not tolerate metformin-XR (diarrhea); he cannot take pioglitizone, due to edema; renal insufficiency also limits oral rx options. He did not tolerate repaglinide (constipation).   Interval history: He says cbg's are well-controlled.  pt states he feels well in general.   Pt states few weeks of moderate pain at the right great toe, and assoc swelling.  This started when he scratched it at the beach.   Past Medical History:  Diagnosis Date  . ALLERGIC ASTHMA 11/25/2007  . ALLERGIC RHINITIS 11/25/2007  . Allergy   . Arthritis, lumbar spine 04/18/2011  . BIPOLAR DISORDER UNSPECIFIED 10/06/2009  . Blood transfusion without reported diagnosis    s/p goiter surgery age 31   . COLONIC POLYPS, HX OF 10/06/2009  . Degenerative arthritis of hip 04/18/2011  . DJD (degenerative joint disease)   . DM w/o Complication Type II 32/02/5187  . GERD 10/06/2009  . Hemorrhage yrs ago   after goiter surgery, tracheostomy inserted and later removed  . Hemorrhoid   . HYPERLIPIDEMIA 10/06/2009  . HYPERTENSION, BENIGN 07/27/2009  . HYPOTHYROIDISM 10/06/2009  . NEPHROLITHIASIS, HX OF 10/06/2009  . OBSTRUCTIVE SLEEP APNEA 11/25/2007   cpap setting of 9  . Peptic stricture of esophagus   . RBBB (right bundle branch block)   . Sleep apnea    wears c pap    Past Surgical History:  Procedure Laterality Date  . COLONOSCOPY    . GANGLION CYST EXCISION     2-3 cysts removed  . thyroid Goiter surgery    . TONSILLECTOMY    . TOTAL HIP ARTHROPLASTY  06/16/2012   Procedure: TOTAL HIP ARTHROPLASTY ANTERIOR APPROACH;  Surgeon: Mauri Pole, MD;   Location: [facility];  Service: Orthopedics;  Laterality: Left;  . UMBILICAL HERNIA REPAIR  several yrs ago  . UPPER GASTROINTESTINAL ENDOSCOPY      Social History   Socioeconomic History  . Marital status: Married    Spouse name: Not on file  . Number of children: 3  . Years of education: Not on file  . Highest education level: Not on file  Occupational History  . Occupation: retired  Scientific laboratory technician  . Financial resource strain: Not on file  . Food insecurity:    Worry: Not on file    Inability: Not on file  . Transportation needs:    Medical: Not on file    Non-medical: Not on file  Tobacco Use  . Smoking status: Former Smoker    Packs/day: 1.50    Years: 15.00    Pack years: 22.50    Types: Cigarettes, Pipe    Last attempt to quit: 06/23/1977    Years since quitting: 40.3  . Smokeless tobacco: Never Used  Substance and Sexual Activity  . Alcohol use: No    Alcohol/week: 0.0 oz  . Drug use: No  . Sexual activity: Not on file  Lifestyle  . Physical activity:    Days per week: Not on file    Minutes per session: Not on file  . Stress: Not on file  Relationships  .  Social connections:    Talks on phone: Not on file    Gets together: Not on file    Attends religious service: Not on file    Active member of club or organization: Not on file    Attends meetings of clubs or organizations: Not on file    Relationship status: Not on file  . Intimate partner violence:    Fear of current or ex partner: Not on file    Emotionally abused: Not on file    Physically abused: Not on file    Forced sexual activity: Not on file  Other Topics Concern  . Not on file  Social History Narrative  . Not on file    Current Outpatient Medications on File Prior to Visit  Medication Sig Dispense Refill  . ACCU-CHEK AVIVA PLUS test strip CHECK BLOOD GLUCOSE (SUGAR) TWICE DAILY 200 each 1  . ACCU-CHEK FASTCLIX LANCETS MISC USE TWICE DAILY 102 each 5  . ADVAIR DISKUS 250-50 MCG/DOSE AEPB  INHALE 1 PUFF AND RINSE MOUTH TWICE DAILY. MAINTENANCE INHALER 180 each 1  . aspirin 81 MG chewable tablet Chew 81 mg by mouth daily.    . Blood Glucose Monitoring Suppl (ACCU-CHEK AVIVA PLUS) W/DEVICE KIT AS DIRECTED 1 kit 0  . bromocriptine (PARLODEL) 2.5 MG tablet TAKE ONE-HALF TABLET DAILY 45 tablet 2  . carbamazepine (TEGRETOL) 200 MG tablet TAKE 2 TABLETS AT BEDTIME 180 tablet 4  . Cholecalciferol (VITAMIN D3) 2000 UNITS TABS Take 2,000 Units by mouth every morning.     . clobetasol cream (TEMOVATE) 1.61 % Apply 1 application topically 2 (two) times daily as needed. For eczema.    . Coenzyme Q10 (CO Q 10) 100 MG CAPS Take 200 mg by mouth daily.    Marland Kitchen desonide (DESOWEN) 0.05 % cream Apply 1 application topically 2 (two) times daily.    . fluticasone (FLONASE) 50 MCG/ACT nasal spray ONE SPRAY INTO NOSE EVERY DAY 48 g 1  . glucosamine-chondroitin 500-400 MG tablet Take 1 tablet by mouth every morning.     Marland Kitchen glucose blood (ACCU-CHEK AVIVA PLUS) test strip Used to check blood sugars twice a day Dx E11.9 200 each 0  . glucose blood test strip Use to check blood sugar 2 times per day. DX Code: E11.9 200 each 2  . hydrochlorothiazide (HYDRODIURIL) 25 MG tablet TAKE ONE TABLET DAILY 90 tablet 1  . hydrocortisone 2.5 % lotion As directed    . Lancets Misc. (ACCU-CHEK FASTCLIX LANCET) KIT AS DIRECTED 1 kit 0  . levothyroxine (SYNTHROID, LEVOTHROID) 112 MCG tablet TAKE ONE TABLET EACH DAY 90 tablet 1  . Multiple Vitamins-Minerals (ICAPS AREDS FORMULA PO) Take 1 capsule by mouth daily.    . Na Sulfate-K Sulfate-Mg Sulf SOLN Take 1 kit by mouth once. 354 mL 0  . nateglinide (STARLIX) 60 MG tablet Take 0.5 tablets (30 mg total) by mouth 3 (three) times daily with meals. 45 tablet 11  . olmesartan (BENICAR) 40 MG tablet TAKE ONE TABLET EVERY DAY 90 tablet 3  . Omega-3 Fatty Acids (FISH OIL) 1200 MG CAPS Take 1 capsule by mouth daily.    Marland Kitchen OVER THE COUNTER MEDICATION Take 1 tablet by mouth daily. Tumeric     . PROAIR HFA 108 (90 BASE) MCG/ACT inhaler 2 PUFFS FOUR TIMES DAILY AS NEEDED 8.5 g 0  . PROCTOZONE-HC 2.5 % rectal cream APPLY TWICE DAILY RECTALLY 30 g 0  . rosuvastatin (CRESTOR) 20 MG tablet Take 1 tablet (20 mg total) by  mouth daily. 90 tablet 3  . sitaGLIPtin (JANUVIA) 100 MG tablet Take 0.5 tablets (50 mg total) by mouth daily. 45 tablet 3  . tamsulosin (FLOMAX) 0.4 MG CAPS capsule TAKE ONE CAPSULE EACH DAY 90 capsule 1  . atorvastatin (LIPITOR) 10 MG tablet Take 1 tablet (10 mg total) by mouth daily. (Patient not taking: Reported on 10/10/2017) 90 tablet 3  . meloxicam (MOBIC) 15 MG tablet TAKE ONE TABLET EVERY DAY (Patient not taking: Reported on 10/10/2017) 90 tablet 1  . traMADol (ULTRAM) 50 MG tablet Take 1 tablet (50 mg total) by mouth every 8 (eight) hours as needed. (Patient not taking: Reported on 10/10/2017) 90 tablet 2   No current facility-administered medications on file prior to visit.     Allergies  Allergen Reactions  . Cefuroxime Axetil Anaphylaxis    REACTION: anaphylaxis-ceftin   . Cefprozil     REACTION: unknown  . Crestor [Rosuvastatin Calcium]   . Lipitor [Atorvastatin]     myalgiaas  . Metformin And Related   . Rabeprazole Sodium Nausea Only    REACTION: nausea    Family History  Problem Relation Age of Onset  . Throat cancer Mother   . Alcohol abuse Brother   . Diabetes Brother   . Drug abuse Daughter   . Alcoholism Unknown        uncle  . Breast cancer Father   . Colon cancer Neg Hx   . Colon polyps Neg Hx   . Esophageal cancer Neg Hx   . Rectal cancer Neg Hx   . Stomach cancer Neg Hx     BP (!) 142/68   Pulse 67   Wt 256 lb 9.6 oz (116.4 kg)   SpO2 97%   BMI 36.30 kg/m   Review of Systems He denies hypoglycemia and fever.      Objective:   Physical Exam VITAL SIGNS:  See vs page GENERAL: no distress Pulses: dorsalis pedis intact bilat.   MSK: no deformity of the feet CV: no leg edema.   Skin:  no ulcer on the feet.  normal  color and temp on the feet, except for erythema at the right great toe paronychial area Neuro: sensation is intact to touch on the feet.     Lab Results  Component Value Date   HGBA1C 6.8 10/10/2017   Lab Results  Component Value Date   CREATININE 1.48 05/02/2017   BUN 36 (H) 05/02/2017   NA 136 05/02/2017   K 4.4 05/02/2017   CL 102 05/02/2017   CO2 25 05/02/2017      Assessment & Plan:  type 2 DM, with polyneuropathy: well-controlled Renal insuff: this limits rx options.  Paronychial cellulitis, new   Patient Instructions  I have sent a prescription to your pharmacy, for an antibiotic pill.   You will get better much faster if you elevate your right foot above the rest of your body. Please continue the same diabetes medications. check your blood sugar once a day.  vary the time of day when you check, between before the 3 meals, and at bedtime.  also check if you have symptoms of your blood sugar being too high or too low.  please keep a record of the readings and bring it to your next appointment here (or you can bring the meter itself).  You can write it on any piece of paper.  please call us sooner if your blood sugar goes below 70, or if you have a lot of  readings over 200. Please come back for a follow-up appointment in 4 months.

## 2017-10-13 ENCOUNTER — Other Ambulatory Visit: Payer: Self-pay | Admitting: Internal Medicine

## 2017-10-16 DIAGNOSIS — Z96642 Presence of left artificial hip joint: Secondary | ICD-10-CM | POA: Diagnosis not present

## 2017-10-16 DIAGNOSIS — M1612 Unilateral primary osteoarthritis, left hip: Secondary | ICD-10-CM | POA: Diagnosis not present

## 2017-10-16 DIAGNOSIS — L089 Local infection of the skin and subcutaneous tissue, unspecified: Secondary | ICD-10-CM | POA: Diagnosis not present

## 2017-10-16 DIAGNOSIS — Z471 Aftercare following joint replacement surgery: Secondary | ICD-10-CM | POA: Diagnosis not present

## 2017-10-17 ENCOUNTER — Ambulatory Visit: Payer: Medicare Other | Admitting: Endocrinology

## 2017-10-17 ENCOUNTER — Encounter: Payer: Self-pay | Admitting: Endocrinology

## 2017-10-17 DIAGNOSIS — L03039 Cellulitis of unspecified toe: Secondary | ICD-10-CM

## 2017-10-17 MED ORDER — DOXYCYCLINE HYCLATE 100 MG PO TABS: 100.0000 mg | ORAL_TABLET | Freq: Two times a day (BID) | ORAL | 0 refills | Status: DC

## 2017-10-17 NOTE — Patient Instructions (Addendum)
I have sent a prescription to your pharmacy, to refill the antibiotic pill.   You will get better much faster if you elevate your right foot above the rest of your body. Please see Dr Jenny Reichmann if your toe does not get completely better.

## 2017-10-17 NOTE — Progress Notes (Signed)
Subjective:    Patient ID: Joseph Villa, male    DOB: Dec 17, 1940, 77 y.o.   MRN: [id number]  HPI Pt was seen a few days ago, for paronychial infection.  Since then, he says sxs are much better.  He saw ortho, who advised continue abx until back to normal, due to [facility].   Past Medical History:  Diagnosis Date  . ALLERGIC ASTHMA 11/25/2007  . ALLERGIC RHINITIS 11/25/2007  . Allergy   . Arthritis, lumbar spine 04/18/2011  . BIPOLAR DISORDER UNSPECIFIED 10/06/2009  . Blood transfusion without reported diagnosis    s/p goiter surgery age 89   . COLONIC POLYPS, HX OF 10/06/2009  . Degenerative arthritis of hip 04/18/2011  . DJD (degenerative joint disease)   . DM w/o Complication Type II 10/03/9765  . GERD 10/06/2009  . Hemorrhage yrs ago   after goiter surgery, tracheostomy inserted and later removed  . Hemorrhoid   . HYPERLIPIDEMIA 10/06/2009  . HYPERTENSION, BENIGN 07/27/2009  . HYPOTHYROIDISM 10/06/2009  . NEPHROLITHIASIS, HX OF 10/06/2009  . OBSTRUCTIVE SLEEP APNEA 11/25/2007   cpap setting of 9  . Peptic stricture of esophagus   . RBBB (right bundle branch block)   . Sleep apnea    wears c pap    Past Surgical History:  Procedure Laterality Date  . COLONOSCOPY    . GANGLION CYST EXCISION     2-3 cysts removed  . thyroid Goiter surgery    . TONSILLECTOMY    . TOTAL HIP ARTHROPLASTY  06/16/2012   Procedure: TOTAL HIP ARTHROPLASTY ANTERIOR APPROACH;  Surgeon: Mauri Pole, MD;  Location: [facility];  Service: Orthopedics;  Laterality: Left;  . UMBILICAL HERNIA REPAIR  several yrs ago  . UPPER GASTROINTESTINAL ENDOSCOPY      Social History   Socioeconomic History  . Marital status: Married    Spouse name: Not on file  . Number of children: 3  . Years of education: Not on file  . Highest education level: Not on file  Occupational History  . Occupation: retired  Scientific laboratory technician  . Financial resource strain: Not on file  . Food insecurity:    Worry: Not on file    Inability: Not  on file  . Transportation needs:    Medical: Not on file    Non-medical: Not on file  Tobacco Use  . Smoking status: Former Smoker    Packs/day: 1.50    Years: 15.00    Pack years: 22.50    Types: Cigarettes, Pipe    Last attempt to quit: 06/23/1977    Years since quitting: 40.3  . Smokeless tobacco: Never Used  Substance and Sexual Activity  . Alcohol use: No    Alcohol/week: 0.0 oz  . Drug use: No  . Sexual activity: Not on file  Lifestyle  . Physical activity:    Days per week: Not on file    Minutes per session: Not on file  . Stress: Not on file  Relationships  . Social connections:    Talks on phone: Not on file    Gets together: Not on file    Attends religious service: Not on file    Active member of club or organization: Not on file    Attends meetings of clubs or organizations: Not on file    Relationship status: Not on file  . Intimate partner violence:    Fear of current or ex partner: Not on file    Emotionally abused: Not on file  Physically abused: Not on file    Forced sexual activity: Not on file  Other Topics Concern  . Not on file  Social History Narrative  . Not on file    Current Outpatient Medications on File Prior to Visit  Medication Sig Dispense Refill  . ACCU-CHEK AVIVA PLUS test strip CHECK BLOOD GLUCOSE (SUGAR) TWICE DAILY 200 each 1  . ACCU-CHEK FASTCLIX LANCETS MISC USE TWICE DAILY 102 each 5  . ADVAIR DISKUS 250-50 MCG/DOSE AEPB INHALE 1 PUFF AND RINSE MOUTH TWICE DAILY. MAINTENANCE INHALER 180 each 1  . aspirin 81 MG chewable tablet Chew 81 mg by mouth daily.    . Blood Glucose Monitoring Suppl (ACCU-CHEK AVIVA PLUS) W/DEVICE KIT AS DIRECTED 1 kit 0  . bromocriptine (PARLODEL) 2.5 MG tablet TAKE ONE-HALF TABLET DAILY 45 tablet 2  . carbamazepine (TEGRETOL) 200 MG tablet TAKE 2 TABLETS AT BEDTIME 180 tablet 4  . Cholecalciferol (VITAMIN D3) 2000 UNITS TABS Take 2,000 Units by mouth every morning.     . clobetasol cream (TEMOVATE)  7.51 % Apply 1 application topically 2 (two) times daily as needed. For eczema.    . Coenzyme Q10 (CO Q 10) 100 MG CAPS Take 200 mg by mouth daily.    Marland Kitchen desonide (DESOWEN) 0.05 % cream Apply 1 application topically 2 (two) times daily.    . fluticasone (FLONASE) 50 MCG/ACT nasal spray ONE SPRAY INTO NOSE EVERY DAY 48 g 1  . glucosamine-chondroitin 500-400 MG tablet Take 1 tablet by mouth every morning.     Marland Kitchen glucose blood (ACCU-CHEK AVIVA PLUS) test strip Used to check blood sugars twice a day Dx E11.9 200 each 0  . glucose blood test strip Use to check blood sugar 2 times per day. DX Code: E11.9 200 each 2  . hydrochlorothiazide (HYDRODIURIL) 25 MG tablet TAKE ONE TABLET DAILY 90 tablet 1  . hydrocortisone 2.5 % lotion As directed    . Lancets Misc. (ACCU-CHEK FASTCLIX LANCET) KIT AS DIRECTED 1 kit 0  . levothyroxine (SYNTHROID, LEVOTHROID) 112 MCG tablet TAKE ONE TABLET EACH DAY 90 tablet 1  . Multiple Vitamins-Minerals (ICAPS AREDS FORMULA PO) Take 1 capsule by mouth daily.    . Na Sulfate-K Sulfate-Mg Sulf SOLN Take 1 kit by mouth once. 354 mL 0  . nateglinide (STARLIX) 60 MG tablet Take 0.5 tablets (30 mg total) by mouth 3 (three) times daily with meals. 45 tablet 11  . olmesartan (BENICAR) 40 MG tablet TAKE ONE TABLET EVERY DAY 90 tablet 3  . Omega-3 Fatty Acids (FISH OIL) 1200 MG CAPS Take 1 capsule by mouth daily.    Marland Kitchen OVER THE COUNTER MEDICATION Take 1 tablet by mouth daily. Tumeric    . pantoprazole (PROTONIX) 40 MG tablet Take 1 tablet (40 mg total) by mouth daily. Annual appt is due must see provider for future refills 30 tablet 0  . PROAIR HFA 108 (90 BASE) MCG/ACT inhaler 2 PUFFS FOUR TIMES DAILY AS NEEDED 8.5 g 0  . PROCTOZONE-HC 2.5 % rectal cream APPLY TWICE DAILY RECTALLY 30 g 0  . rosuvastatin (CRESTOR) 20 MG tablet Take 1 tablet (20 mg total) by mouth daily. 90 tablet 3  . sitaGLIPtin (JANUVIA) 100 MG tablet Take 0.5 tablets (50 mg total) by mouth daily. 45 tablet 3  .  tamsulosin (FLOMAX) 0.4 MG CAPS capsule TAKE ONE CAPSULE EACH DAY 90 capsule 1  . atorvastatin (LIPITOR) 10 MG tablet Take 1 tablet (10 mg total) by mouth daily. (Patient not taking:  Reported on 10/17/2017) 90 tablet 3  . meloxicam (MOBIC) 15 MG tablet TAKE ONE TABLET EVERY DAY (Patient not taking: Reported on 10/17/2017) 90 tablet 1  . traMADol (ULTRAM) 50 MG tablet Take 1 tablet (50 mg total) by mouth every 8 (eight) hours as needed. (Patient not taking: Reported on 10/17/2017) 90 tablet 2   No current facility-administered medications on file prior to visit.     Allergies  Allergen Reactions  . Cefuroxime Axetil Anaphylaxis    REACTION: anaphylaxis-ceftin   . Cefprozil     REACTION: unknown  . Crestor [Rosuvastatin Calcium]   . Lipitor [Atorvastatin]     myalgiaas  . Metformin And Related   . Rabeprazole Sodium Nausea Only    REACTION: nausea    Family History  Problem Relation Age of Onset  . Throat cancer Mother   . Alcohol abuse Brother   . Diabetes Brother   . Drug abuse Daughter   . Alcoholism Unknown        uncle  . Breast cancer Father   . Colon cancer Neg Hx   . Colon polyps Neg Hx   . Esophageal cancer Neg Hx   . Rectal cancer Neg Hx   . Stomach cancer Neg Hx     BP (!) 134/52   Pulse 78   Wt 254 lb 12.8 oz (115.6 kg)   SpO2 94%   BMI 36.04 kg/m   Review of Systems Denies fever    Objective:   Physical Exam VITAL SIGNS:  See vs page GENERAL: no distress Pulses: dorsalis pedis intact bilat.   MSK: no deformity of the feet CV: no leg edema.   Skin:  no ulcer on the feet.  erythema at the right great toe paronychial area is almost completely resolved.   Neuro: sensation is intact to touch on the feet.       Assessment & Plan:  Paronychial infection, improved H/o THR: ortho had advised pt to continue abx rx.  Patient Instructions  I have sent a prescription to your pharmacy, to refill the antibiotic pill.   You will get better much faster if you  elevate your right foot above the rest of your body. Please see Dr Jenny Reichmann if your toe does not get completely better.

## 2017-10-18 DIAGNOSIS — L03039 Cellulitis of unspecified toe: Secondary | ICD-10-CM | POA: Insufficient documentation

## 2017-10-24 ENCOUNTER — Other Ambulatory Visit (INDEPENDENT_AMBULATORY_CARE_PROVIDER_SITE_OTHER): Payer: Medicare Other

## 2017-10-24 DIAGNOSIS — E1122 Type 2 diabetes mellitus with diabetic chronic kidney disease: Secondary | ICD-10-CM | POA: Diagnosis not present

## 2017-10-24 DIAGNOSIS — N182 Chronic kidney disease, stage 2 (mild): Secondary | ICD-10-CM | POA: Diagnosis not present

## 2017-10-24 DIAGNOSIS — Z0000 Encounter for general adult medical examination without abnormal findings: Secondary | ICD-10-CM

## 2017-10-24 LAB — TSH: TSH: 4.64 u[IU]/mL — ABNORMAL HIGH (ref 0.35–4.50)

## 2017-10-24 LAB — URINALYSIS, ROUTINE W REFLEX MICROSCOPIC
Bilirubin Urine: NEGATIVE
HGB URINE DIPSTICK: NEGATIVE
KETONES UR: NEGATIVE
Leukocytes, UA: NEGATIVE
Nitrite: NEGATIVE
RBC / HPF: NONE SEEN (ref 0–?)
SPECIFIC GRAVITY, URINE: 1.025 (ref 1.000–1.030)
Total Protein, Urine: NEGATIVE
URINE GLUCOSE: NEGATIVE
UROBILINOGEN UA: 0.2 (ref 0.0–1.0)
pH: 5.5 (ref 5.0–8.0)

## 2017-10-24 LAB — HEPATIC FUNCTION PANEL
ALT: 16 U/L (ref 0–53)
AST: 15 U/L (ref 0–37)
Albumin: 4.2 g/dL (ref 3.5–5.2)
Alkaline Phosphatase: 64 U/L (ref 39–117)
BILIRUBIN TOTAL: 0.4 mg/dL (ref 0.2–1.2)
Bilirubin, Direct: 0.1 mg/dL (ref 0.0–0.3)
Total Protein: 6.9 g/dL (ref 6.0–8.3)

## 2017-10-24 LAB — CBC WITH DIFFERENTIAL/PLATELET
BASOS PCT: 0.9 % (ref 0.0–3.0)
Basophils Absolute: 0.1 10*3/uL (ref 0.0–0.1)
EOS PCT: 3.8 % (ref 0.0–5.0)
Eosinophils Absolute: 0.2 10*3/uL (ref 0.0–0.7)
HCT: 38.6 % — ABNORMAL LOW (ref 39.0–52.0)
HEMOGLOBIN: 13.3 g/dL (ref 13.0–17.0)
LYMPHS ABS: 1.7 10*3/uL (ref 0.7–4.0)
Lymphocytes Relative: 26.6 % (ref 12.0–46.0)
MCHC: 34.4 g/dL (ref 30.0–36.0)
MCV: 89.8 fl (ref 78.0–100.0)
Monocytes Absolute: 0.6 10*3/uL (ref 0.1–1.0)
Monocytes Relative: 9.1 % (ref 3.0–12.0)
Neutro Abs: 3.9 10*3/uL (ref 1.4–7.7)
Neutrophils Relative %: 59.6 % (ref 43.0–77.0)
Platelets: 213 10*3/uL (ref 150.0–400.0)
RBC: 4.3 Mil/uL (ref 4.22–5.81)
RDW: 13.5 % (ref 11.5–15.5)
WBC: 6.5 10*3/uL (ref 4.0–10.5)

## 2017-10-24 LAB — LIPID PANEL
CHOL/HDL RATIO: 5
Cholesterol: 222 mg/dL — ABNORMAL HIGH (ref 0–200)
HDL: 41.2 mg/dL (ref >39.00)
NONHDL: 180.98
Triglycerides: 375 mg/dL — ABNORMAL HIGH (ref 0.0–149.0)
VLDL: 75 mg/dL — ABNORMAL HIGH (ref 0.0–40.0)

## 2017-10-24 LAB — HEMOGLOBIN A1C: Hgb A1c MFr Bld: 7.3 % — ABNORMAL HIGH (ref 4.6–6.5)

## 2017-10-24 LAB — PSA: PSA: 0.57 ng/mL (ref 0.10–4.00)

## 2017-10-24 LAB — BASIC METABOLIC PANEL
BUN: 40 mg/dL — AB (ref 6–23)
CHLORIDE: 105 meq/L (ref 96–112)
CO2: 26 meq/L (ref 19–32)
CREATININE: 1.45 mg/dL (ref 0.40–1.50)
Calcium: 9.5 mg/dL (ref 8.4–10.5)
GFR: 50.49 mL/min — ABNORMAL LOW (ref >60.00)
GLUCOSE: 162 mg/dL — AB (ref 70–99)
Potassium: 4.8 mEq/L (ref 3.5–5.1)
Sodium: 140 mEq/L (ref 135–145)

## 2017-10-24 LAB — MICROALBUMIN / CREATININE URINE RATIO
Creatinine,U: 150.1 mg/dL
MICROALB UR: 4.2 mg/dL — AB (ref 0.0–1.9)
Microalb Creat Ratio: 2.8 mg/g (ref 0.0–30.0)

## 2017-10-24 LAB — LDL CHOLESTEROL, DIRECT: Direct LDL: 137 mg/dL

## 2017-10-31 ENCOUNTER — Ambulatory Visit: Payer: Medicare Other | Admitting: Internal Medicine

## 2017-10-31 ENCOUNTER — Encounter: Payer: Self-pay | Admitting: Internal Medicine

## 2017-10-31 VITALS — BP 132/78 | HR 75 | Temp 98.4°F | Ht 70.5 in | Wt 256.0 lb

## 2017-10-31 DIAGNOSIS — N182 Chronic kidney disease, stage 2 (mild): Secondary | ICD-10-CM | POA: Diagnosis not present

## 2017-10-31 DIAGNOSIS — E785 Hyperlipidemia, unspecified: Secondary | ICD-10-CM | POA: Diagnosis not present

## 2017-10-31 DIAGNOSIS — M79674 Pain in right toe(s): Secondary | ICD-10-CM | POA: Diagnosis not present

## 2017-10-31 DIAGNOSIS — E1122 Type 2 diabetes mellitus with diabetic chronic kidney disease: Secondary | ICD-10-CM

## 2017-10-31 DIAGNOSIS — Z23 Encounter for immunization: Secondary | ICD-10-CM | POA: Diagnosis not present

## 2017-10-31 DIAGNOSIS — Z0001 Encounter for general adult medical examination with abnormal findings: Secondary | ICD-10-CM | POA: Diagnosis not present

## 2017-10-31 MED ORDER — FENOFIBRATE 145 MG PO TABS: 145.0000 mg | ORAL_TABLET | Freq: Every day | ORAL | 3 refills | Status: DC

## 2017-10-31 MED ORDER — SULFAMETHOXAZOLE-TRIMETHOPRIM 800-160 MG PO TABS: 1.0000 | ORAL_TABLET | Freq: Two times a day (BID) | ORAL | 0 refills | Status: DC

## 2017-10-31 NOTE — Progress Notes (Signed)
Subjective:    Patient ID: Joseph Villa, male    DOB: Jun 07, 1940, 77 y.o.   MRN: [id number]  HPI  Here for wellness and f/u right great toe pain and swelling;  Overall doing ok;  Pt denies Chest pain, worsening SOB, DOE, wheezing, orthopnea, PND, worsening LE edema, palpitations, dizziness or syncope.  Pt denies neurological change such as new headache, facial or extremity weakness.  Pt states overall good compliance with treatment and medications, good tolerability, and has been trying to follow appropriate diet.  Pt denies worsening depressive symptoms, suicidal ideation or panic. No fever, night sweats, wt loss, loss of appetite, or other constitutional symptoms.  Pt states good ability with ADL's, has low fall risk, home safety reviewed and adequate, no other significant changes in hearing or vision, and only occasionally active with exercise.    Pt denies polydipsia, polyuria, or low sugar symptoms such as weakness or confusion improved with po intake.  Pt states overall good compliance with meds, trying to follow lower cholesterol, diabetic diet, wt overall stable but little exercise however.    Wt Readings from Last 3 Encounters:  10/31/17 256 lb (116.1 kg)  10/17/17 254 lb 12.8 oz (115.6 kg)  10/10/17 256 lb 9.6 oz (116.4 kg)  Also with c/o persistent right great toe redness, swelling, tender that is only somewhat better but still red, tender swelling at the DIP, no fever, no ulcer or drainage, no red streaks Past Medical History:  Diagnosis Date  . ALLERGIC ASTHMA 11/25/2007  . ALLERGIC RHINITIS 11/25/2007  . Allergy   . Arthritis, lumbar spine 04/18/2011  . BIPOLAR DISORDER UNSPECIFIED 10/06/2009  . Blood transfusion without reported diagnosis    s/p goiter surgery age 8   . COLONIC POLYPS, HX OF 10/06/2009  . Degenerative arthritis of hip 04/18/2011  . DJD (degenerative joint disease)   . DM w/o Complication Type II 93/02/299  . GERD 10/06/2009  . Hemorrhage yrs ago   after goiter  surgery, tracheostomy inserted and later removed  . Hemorrhoid   . HYPERLIPIDEMIA 10/06/2009  . HYPERTENSION, BENIGN 07/27/2009  . HYPOTHYROIDISM 10/06/2009  . NEPHROLITHIASIS, HX OF 10/06/2009  . OBSTRUCTIVE SLEEP APNEA 11/25/2007   cpap setting of 9  . Peptic stricture of esophagus   . RBBB (right bundle branch block)   . Sleep apnea    wears c pap   Past Surgical History:  Procedure Laterality Date  . COLONOSCOPY    . GANGLION CYST EXCISION     2-3 cysts removed  . thyroid Goiter surgery    . TONSILLECTOMY    . TOTAL HIP ARTHROPLASTY  06/16/2012   Procedure: TOTAL HIP ARTHROPLASTY ANTERIOR APPROACH;  Surgeon: Mauri Pole, MD;  Location: [facility];  Service: Orthopedics;  Laterality: Left;  . UMBILICAL HERNIA REPAIR  several yrs ago  . UPPER GASTROINTESTINAL ENDOSCOPY      reports that he quit smoking about 40 years ago. His smoking use included cigarettes and pipe. He has a 22.50 pack-year smoking history. He has never used smokeless tobacco. He reports that he does not drink alcohol or use drugs. family history includes Alcohol abuse in his brother; Alcoholism in his unknown relative; Breast cancer in his father; Diabetes in his brother; Drug abuse in his daughter; Throat cancer in his mother. Allergies  Allergen Reactions  . Cefuroxime Axetil Anaphylaxis    REACTION: anaphylaxis-ceftin   . Cefprozil     REACTION: unknown  . Crestor [Rosuvastatin Calcium]   . Lipitor [  Atorvastatin]     myalgiaas  . Metformin And Related   . Rabeprazole Sodium Nausea Only    REACTION: nausea  . Statins    Current Outpatient Medications on File Prior to Visit  Medication Sig Dispense Refill  . ACCU-CHEK AVIVA PLUS test strip CHECK BLOOD GLUCOSE (SUGAR) TWICE DAILY 200 each 1  . ACCU-CHEK FASTCLIX LANCETS MISC USE TWICE DAILY 102 each 5  . ADVAIR DISKUS 250-50 MCG/DOSE AEPB INHALE 1 PUFF AND RINSE MOUTH TWICE DAILY. MAINTENANCE INHALER 180 each 1  . aspirin 81 MG chewable tablet Chew 81 mg by  mouth daily.    . Blood Glucose Monitoring Suppl (ACCU-CHEK AVIVA PLUS) W/DEVICE KIT AS DIRECTED 1 kit 0  . bromocriptine (PARLODEL) 2.5 MG tablet TAKE ONE-HALF TABLET DAILY 45 tablet 2  . carbamazepine (TEGRETOL) 200 MG tablet TAKE 2 TABLETS AT BEDTIME 180 tablet 4  . Cholecalciferol (VITAMIN D3) 2000 UNITS TABS Take 2,000 Units by mouth every morning.     . clobetasol cream (TEMOVATE) 9.37 % Apply 1 application topically 2 (two) times daily as needed. For eczema.    . Coenzyme Q10 (CO Q 10) 100 MG CAPS Take 200 mg by mouth daily.    Marland Kitchen desonide (DESOWEN) 0.05 % cream Apply 1 application topically 2 (two) times daily.    . fluticasone (FLONASE) 50 MCG/ACT nasal spray ONE SPRAY INTO NOSE EVERY DAY 48 g 1  . glucosamine-chondroitin 500-400 MG tablet Take 1 tablet by mouth every morning.     Marland Kitchen glucose blood (ACCU-CHEK AVIVA PLUS) test strip Used to check blood sugars twice a day Dx E11.9 200 each 0  . glucose blood test strip Use to check blood sugar 2 times per day. DX Code: E11.9 200 each 2  . hydrochlorothiazide (HYDRODIURIL) 25 MG tablet TAKE ONE TABLET DAILY 90 tablet 1  . hydrocortisone 2.5 % lotion As directed    . Lancets Misc. (ACCU-CHEK FASTCLIX LANCET) KIT AS DIRECTED 1 kit 0  . levothyroxine (SYNTHROID, LEVOTHROID) 112 MCG tablet TAKE ONE TABLET EACH DAY 90 tablet 1  . Multiple Vitamins-Minerals (ICAPS AREDS FORMULA PO) Take 1 capsule by mouth daily.    . Na Sulfate-K Sulfate-Mg Sulf SOLN Take 1 kit by mouth once. 354 mL 0  . nateglinide (STARLIX) 60 MG tablet Take 0.5 tablets (30 mg total) by mouth 3 (three) times daily with meals. 45 tablet 11  . olmesartan (BENICAR) 40 MG tablet TAKE ONE TABLET EVERY DAY 90 tablet 3  . Omega-3 Fatty Acids (FISH OIL) 1200 MG CAPS Take 1 capsule by mouth daily.    Marland Kitchen OVER THE COUNTER MEDICATION Take 1 tablet by mouth daily. Tumeric    . pantoprazole (PROTONIX) 40 MG tablet Take 1 tablet (40 mg total) by mouth daily. Annual appt is due must see provider  for future refills 30 tablet 0  . PROAIR HFA 108 (90 BASE) MCG/ACT inhaler 2 PUFFS FOUR TIMES DAILY AS NEEDED 8.5 g 0  . PROCTOZONE-HC 2.5 % rectal cream APPLY TWICE DAILY RECTALLY 30 g 0  . rosuvastatin (CRESTOR) 20 MG tablet Take 1 tablet (20 mg total) by mouth daily. 90 tablet 3  . sitaGLIPtin (JANUVIA) 100 MG tablet Take 0.5 tablets (50 mg total) by mouth daily. 45 tablet 3  . tamsulosin (FLOMAX) 0.4 MG CAPS capsule TAKE ONE CAPSULE EACH DAY 90 capsule 1   No current facility-administered medications on file prior to visit.    Review of Systems Constitutional: Negative for other unusual diaphoresis, sweats, appetite  or weight changes HENT: Negative for other worsening hearing loss, ear pain, facial swelling, mouth sores or neck stiffness.   Eyes: Negative for other worsening pain, redness or other visual disturbance.  Respiratory: Negative for other stridor or swelling Cardiovascular: Negative for other palpitations or other chest pain  Gastrointestinal: Negative for worsening diarrhea or loose stools, blood in stool, distention or other pain Genitourinary: Negative for hematuria, flank pain or other change in urine volume.  Musculoskeletal: Negative for myalgias or other joint swelling.  Skin: Negative for other color change, or other wound or worsening drainage.  Neurological: Negative for other syncope or numbness. Hematological: Negative for other adenopathy or swelling Psychiatric/Behavioral: Negative for hallucinations, other worsening agitation, SI, self-injury, or new decreased concentration All other system neg per pt    Objective:   Physical Exam BP 132/78   Pulse 75   Temp 98.4 F (36.9 C) (Oral)   Ht 5' 10.5" (1.791 m)   Wt 256 lb (116.1 kg)   SpO2 96%   BMI 36.21 kg/m  VS noted,  Constitutional: Pt is oriented to person, place, and time. Appears well-developed and well-nourished, in no significant distress and comfortable Head: Normocephalic and atraumatic    Eyes: Conjunctivae and EOM are normal. Pupils are equal, round, and reactive to light Right Ear: External ear normal without discharge Left Ear: External ear normal without discharge Nose: Nose without discharge or deformity Mouth/Throat: Oropharynx is without other ulcerations and moist  Neck: Normal range of motion. Neck supple. No JVD present. No tracheal deviation present or significant neck LA or mass Cardiovascular: Normal rate, regular rhythm, normal heart sounds and intact distal pulses.   Pulmonary/Chest: WOB normal and breath sounds without rales or wheezing  Abdominal: Soft. Bowel sounds are normal. NT. No HSM  Musculoskeletal: Normal range of motion. Exhibits no edema Lymphadenopathy: Has no other cervical adenopathy.  Neurological: Pt is alert and oriented to person, place, and time. Pt has normal reflexes. No cranial nerve deficit. Motor grossly intact, Gait intact Skin: Skin is warm and dry. No rash noted or new ulcerations but right great toe DIP redness, tender Psychiatric:  Has normal mood and affect. Behavior is normal without agitation No other exam findings Lab Results  Component Value Date   WBC 6.5 10/24/2017   HGB 13.3 10/24/2017   HCT 38.6 (L) 10/24/2017   PLT 213.0 10/24/2017   GLUCOSE 162 (H) 10/24/2017   CHOL 222 (H) 10/24/2017   TRIG 375.0 (H) 10/24/2017   HDL 41.20 10/24/2017   LDLDIRECT 137.0 10/24/2017   LDLCALC 87 12/24/2013   ALT 16 10/24/2017   AST 15 10/24/2017   NA 140 10/24/2017   K 4.8 10/24/2017   CL 105 10/24/2017   CREATININE 1.45 10/24/2017   BUN 40 (H) 10/24/2017   CO2 26 10/24/2017   TSH 4.64 (H) 10/24/2017   PSA 0.57 10/24/2017   INR 0.99 06/08/2012   HGBA1C 7.3 (H) 10/24/2017   MICROALBUR 4.2 (H) 10/24/2017       Assessment & Plan:

## 2017-10-31 NOTE — Patient Instructions (Addendum)
You had the Pneumovax pneumonia shot today  OK to stop the doxycycline after your last dose today  Please take all new medication as prescribed - the bactrim antibiotic  Please take all new medication as prescribed - the fenofibrate  Please continue all other medications as before, and refills have been done if requested.  Please have the pharmacy call with any other refills you may need.  Please continue your efforts at being more active, low cholesterol diet, and weight control.  You are otherwise up to date with prevention measures today.  Please keep your appointments with your specialists as you may have planned  You will be contacted regarding the referral for: podiatry  Please return in 6 months, or sooner if needed, with Lab testing done 3-5 days before  .

## 2017-11-01 DIAGNOSIS — M79674 Pain in right toe(s): Secondary | ICD-10-CM | POA: Insufficient documentation

## 2017-11-01 NOTE — Assessment & Plan Note (Addendum)
[place] for try change doxy to septra ds, refer podiatry  In addition to the time spent performing CPE, I spent an additional 15 minutes face to face,in which greater than 50% of this time was spent in counseling and coordination of care for patient's illness as documented, including the differential dx, treatment, further evaluation and other management of right great toe pain, HLD, DM

## 2017-11-01 NOTE — Assessment & Plan Note (Signed)
stable overall by history and exam, recent data reviewed with pt, and pt to continue medical treatment as before,  to f/u any worsening symptoms or concerns Lab Results  Component Value Date   HGBA1C 7.3 (H) 10/24/2017

## 2017-11-01 NOTE — Assessment & Plan Note (Signed)
Uncontrolled, for add fenofibrate asd,  to f/u any worsening symptoms or concerns

## 2017-11-01 NOTE — Assessment & Plan Note (Signed)

## 2017-11-07 ENCOUNTER — Other Ambulatory Visit: Payer: Self-pay | Admitting: Internal Medicine

## 2017-11-13 ENCOUNTER — Ambulatory Visit: Payer: Medicare Other | Admitting: Podiatry

## 2017-11-13 ENCOUNTER — Ambulatory Visit (INDEPENDENT_AMBULATORY_CARE_PROVIDER_SITE_OTHER): Payer: Medicare Other

## 2017-11-13 ENCOUNTER — Encounter: Payer: Self-pay | Admitting: Podiatry

## 2017-11-13 VITALS — BP 113/57 | HR 71 | Resp 16

## 2017-11-13 DIAGNOSIS — M7751 Other enthesopathy of right foot: Secondary | ICD-10-CM

## 2017-11-13 DIAGNOSIS — M779 Enthesopathy, unspecified: Secondary | ICD-10-CM | POA: Diagnosis not present

## 2017-11-13 DIAGNOSIS — M109 Gout, unspecified: Secondary | ICD-10-CM

## 2017-11-13 NOTE — Progress Notes (Signed)
Subjective:  Patient ID: Joseph Villa, male    DOB: 07-Jan-1941,  MRN: [id number] HPI Chief Complaint  Patient presents with  . Toe Pain    Hallux right - tender x few weeks, noticed there was a cut on toe after returning from the beach, but now healed, redness and swelling is a little better - Dr. Loanne Drilling Rx'd Doxy and Bactrim, still some redness and noticeable knot x 2 now  . New Patient (Initial Visit)    77 y.o. male presents with the above complaint.   ROS: Denies fever chills nausea vomiting muscle aches pains calf pain back pain chest pain shortness of breath.  Past Medical History:  Diagnosis Date  . ALLERGIC ASTHMA 11/25/2007  . ALLERGIC RHINITIS 11/25/2007  . Allergy   . Arthritis, lumbar spine 04/18/2011  . BIPOLAR DISORDER UNSPECIFIED 10/06/2009  . Blood transfusion without reported diagnosis    s/p goiter surgery age 64   . COLONIC POLYPS, HX OF 10/06/2009  . Degenerative arthritis of hip 04/18/2011  . DJD (degenerative joint disease)   . DM w/o Complication Type II 93/01/1016  . GERD 10/06/2009  . Hemorrhage yrs ago   after goiter surgery, tracheostomy inserted and later removed  . Hemorrhoid   . HYPERLIPIDEMIA 10/06/2009  . HYPERTENSION, BENIGN 07/27/2009  . HYPOTHYROIDISM 10/06/2009  . NEPHROLITHIASIS, HX OF 10/06/2009  . OBSTRUCTIVE SLEEP APNEA 11/25/2007   cpap setting of 9  . Peptic stricture of esophagus   . RBBB (right bundle branch block)   . Sleep apnea    wears c pap   Past Surgical History:  Procedure Laterality Date  . COLONOSCOPY    . GANGLION CYST EXCISION     2-3 cysts removed  . thyroid Goiter surgery    . TONSILLECTOMY    . TOTAL HIP ARTHROPLASTY  06/16/2012   Procedure: TOTAL HIP ARTHROPLASTY ANTERIOR APPROACH;  Surgeon: Mauri Pole, MD;  Location: [facility];  Service: Orthopedics;  Laterality: Left;  . UMBILICAL HERNIA REPAIR  several yrs ago  . UPPER GASTROINTESTINAL ENDOSCOPY      Current Outpatient Medications:  .  doxycycline (ADOXA) 150  MG tablet, Take 150 mg by mouth 2 (two) times daily., Disp: , Rfl:  .  sulfamethoxazole-trimethoprim (BACTRIM,SEPTRA) 400-80 MG tablet, Take 1 tablet by mouth 2 (two) times daily., Disp: , Rfl:  .  ACCU-CHEK AVIVA PLUS test strip, CHECK BLOOD GLUCOSE (SUGAR) TWICE DAILY, Disp: 200 each, Rfl: 1 .  ACCU-CHEK FASTCLIX LANCETS MISC, USE TWICE DAILY, Disp: 102 each, Rfl: 5 .  ADVAIR DISKUS 250-50 MCG/DOSE AEPB, INHALE 1 PUFF AND RINSE MOUTH TWICE DAILY. MAINTENANCE INHALER, Disp: 180 each, Rfl: 1 .  aspirin 81 MG chewable tablet, Chew 81 mg by mouth daily., Disp: , Rfl:  .  Blood Glucose Monitoring Suppl (ACCU-CHEK AVIVA PLUS) W/DEVICE KIT, AS DIRECTED, Disp: 1 kit, Rfl: 0 .  bromocriptine (PARLODEL) 2.5 MG tablet, TAKE ONE-HALF TABLET DAILY, Disp: 45 tablet, Rfl: 2 .  carbamazepine (TEGRETOL) 200 MG tablet, TAKE 2 TABLETS AT BEDTIME, Disp: 180 tablet, Rfl: 4 .  Cholecalciferol (VITAMIN D3) 2000 UNITS TABS, Take 2,000 Units by mouth every morning. , Disp: , Rfl:  .  clobetasol cream (TEMOVATE) 5.10 %, Apply 1 application topically 2 (two) times daily as needed. For eczema., Disp: , Rfl:  .  Coenzyme Q10 (CO Q 10) 100 MG CAPS, Take 200 mg by mouth daily., Disp: , Rfl:  .  desonide (DESOWEN) 0.05 % cream, Apply 1 application topically 2 (two)  times daily., Disp: , Rfl:  .  fenofibrate (TRICOR) 145 MG tablet, Take 1 tablet (145 mg total) by mouth daily., Disp: 90 tablet, Rfl: 3 .  fluticasone (FLONASE) 50 MCG/ACT nasal spray, ONE SPRAY INTO NOSE EVERY DAY, Disp: 48 g, Rfl: 1 .  glucosamine-chondroitin 500-400 MG tablet, Take 1 tablet by mouth every morning. , Disp: , Rfl:  .  glucose blood (ACCU-CHEK AVIVA PLUS) test strip, Used to check blood sugars twice a day Dx E11.9, Disp: 200 each, Rfl: 0 .  glucose blood test strip, Use to check blood sugar 2 times per day. DX Code: E11.9, Disp: 200 each, Rfl: 2 .  hydrochlorothiazide (HYDRODIURIL) 25 MG tablet, TAKE ONE TABLET DAILY, Disp: 90 tablet, Rfl: 1 .   hydrocortisone 2.5 % lotion, As directed, Disp: , Rfl:  .  Lancets Misc. (ACCU-CHEK FASTCLIX LANCET) KIT, AS DIRECTED, Disp: 1 kit, Rfl: 0 .  levothyroxine (SYNTHROID, LEVOTHROID) 112 MCG tablet, TAKE ONE TABLET EACH DAY, Disp: 90 tablet, Rfl: 1 .  Multiple Vitamins-Minerals (ICAPS AREDS FORMULA PO), Take 1 capsule by mouth daily., Disp: , Rfl:  .  Na Sulfate-K Sulfate-Mg Sulf SOLN, Take 1 kit by mouth once., Disp: 354 mL, Rfl: 0 .  nateglinide (STARLIX) 60 MG tablet, Take 0.5 tablets (30 mg total) by mouth 3 (three) times daily with meals., Disp: 45 tablet, Rfl: 11 .  olmesartan (BENICAR) 40 MG tablet, TAKE ONE TABLET EVERY DAY, Disp: 90 tablet, Rfl: 3 .  Omega-3 Fatty Acids (FISH OIL) 1200 MG CAPS, Take 1 capsule by mouth daily., Disp: , Rfl:  .  OVER THE COUNTER MEDICATION, Take 1 tablet by mouth daily. Tumeric, Disp: , Rfl:  .  pantoprazole (PROTONIX) 40 MG tablet, TAKE ONE TABLET DAILY, Disp: 30 tablet, Rfl: 11 .  PROAIR HFA 108 (90 BASE) MCG/ACT inhaler, 2 PUFFS FOUR TIMES DAILY AS NEEDED, Disp: 8.5 g, Rfl: 0 .  PROCTOZONE-HC 2.5 % rectal cream, APPLY TWICE DAILY RECTALLY, Disp: 30 g, Rfl: 0 .  rosuvastatin (CRESTOR) 20 MG tablet, Take 1 tablet (20 mg total) by mouth daily., Disp: 90 tablet, Rfl: 3 .  sitaGLIPtin (JANUVIA) 100 MG tablet, Take 0.5 tablets (50 mg total) by mouth daily., Disp: 45 tablet, Rfl: 3 .  tamsulosin (FLOMAX) 0.4 MG CAPS capsule, TAKE ONE CAPSULE EACH DAY, Disp: 90 capsule, Rfl: 1  Allergies  Allergen Reactions  . Cefuroxime Axetil Anaphylaxis    REACTION: anaphylaxis-ceftin   . Cefprozil     REACTION: unknown  . Cephalosporins Nausea Only  . Crestor [Rosuvastatin Calcium]   . Lipitor [Atorvastatin]     myalgiaas  . Metformin And Related   . Rabeprazole Sodium Nausea Only    REACTION: nausea  . Statins    Review of Systems Objective:   Vitals:   11/13/17 0847  BP: (!) 113/57  Pulse: 71  Resp: 16    General: Well developed, nourished, in no acute  distress, alert and oriented x3   Dermatological: Skin is warm, dry and supple bilateral. Nails x 10 are well maintained; remaining integument appears unremarkable at this time. There are no open sores, no preulcerative lesions, no rash or signs of infection present.  Vascular: Dorsalis Pedis artery and Posterior Tibial artery pedal pulses are 2/4 bilateral with immedate capillary fill time. Pedal hair growth present. No varicosities and no lower extremity edema present bilateral.   Neruologic: Grossly intact via light touch bilateral. Vibratory intact via tuning fork bilateral. Protective threshold with Semmes Wienstein monofilament intact to  all pedal sites bilateral. Patellar and Achilles deep tendon reflexes 2+ bilateral. No Babinski or clonus noted bilateral.   Musculoskeletal: No gross boney pedal deformities bilateral. No pain, crepitus, or limitation noted with foot and ankle range of motion bilateral. Muscular strength 5/5 in all groups tested bilateral.  There is mild erythema about the hallux interphalangeal joint of the right foot.  Medial lateral condyle of the base of the distal phalanx demonstrates nodularity like Heberden's nodes.  The toe is stiff and hard to move.  There is no break in the skin no purulence no cellulitis no drainage or odor  Gait: Unassisted, Nonantalgic.    Radiographs:  Radiographs confirm osteoarthritic process possible gouty arthritis hallux interphalangeal joint.  Assessment & Plan:   Assessment: Gouty arthritis hallux interphalangeal joint right foot.  Plan: Since the majority of the pain is subsided at this point I am going to recommend that he follow-up with [place] as soon as he develops a flareup.  At that point he is to receive uric acid requisition for blood draw and an appointment to see [place] immediately.  We have sent him with a requisition today for blood work     Azlynn Mitnick T. [place], [place]

## 2017-11-14 DIAGNOSIS — G4733 Obstructive sleep apnea (adult) (pediatric): Secondary | ICD-10-CM | POA: Diagnosis not present

## 2017-11-14 LAB — CBC WITH DIFFERENTIAL/PLATELET
BASOS PCT: 1.3 %
Basophils Absolute: 73 cells/uL (ref 0–200)
Eosinophils Absolute: 280 cells/uL (ref 15–500)
Eosinophils Relative: 5 %
HEMATOCRIT: 36.1 % — AB (ref 38.5–50.0)
HEMOGLOBIN: 12.3 g/dL — AB (ref 13.2–17.1)
LYMPHS ABS: 1467 {cells}/uL (ref 850–3900)
MCH: 30.1 pg (ref 27.0–33.0)
MCHC: 34.1 g/dL (ref 32.0–36.0)
MCV: 88.3 fL (ref 80.0–100.0)
MPV: 11.5 fL (ref 7.5–12.5)
Monocytes Relative: 7.9 %
NEUTROS ABS: 3338 {cells}/uL (ref 1500–7800)
NEUTROS PCT: 59.6 %
Platelets: 224 10*3/uL (ref 140–400)
RBC: 4.09 10*6/uL — AB (ref 4.20–5.80)
RDW: 12.6 % (ref 11.0–15.0)
Total Lymphocyte: 26.2 %
WBC: 5.6 10*3/uL (ref 3.8–10.8)
WBCMIX: 442 {cells}/uL (ref 200–950)

## 2017-11-14 LAB — URIC ACID: Uric Acid, Serum: 10.1 mg/dL — ABNORMAL HIGH (ref 4.0–8.0)

## 2017-11-19 ENCOUNTER — Other Ambulatory Visit: Payer: Self-pay | Admitting: Endocrinology

## 2017-11-19 ENCOUNTER — Other Ambulatory Visit: Payer: Self-pay | Admitting: Internal Medicine

## 2017-11-19 ENCOUNTER — Telehealth: Payer: Self-pay | Admitting: *Deleted

## 2017-11-19 NOTE — Telephone Encounter (Signed)
-----   Message from Joseph Villa, [place] sent at 11/18/2017  7:03 AM EDT ----- He has gout.  He also has anemia ie low hct and hgb.  See how he is doing.  If not improved then rx colchicine. He should f/u with pcp for anemia.

## 2017-11-19 NOTE — Telephone Encounter (Signed)
I spoke with Joseph Villa and informed of the lab results. Joseph Villa states he is doing fine and requested I speak with his wife, because it is difficult for him to hear on the telephone. I informed Arbie Cookey of Dr. [name] review of results and she states Joseph Villa see Dr. Cathlean Cower - [facility] on [address].

## 2017-11-19 NOTE — Telephone Encounter (Signed)
Joseph Villa states fax [phone]. Faxed labs of 11/13/2017 to Dr. Cathlean Cower.

## 2017-11-21 DIAGNOSIS — E785 Hyperlipidemia, unspecified: Secondary | ICD-10-CM | POA: Diagnosis not present

## 2017-11-21 DIAGNOSIS — E1122 Type 2 diabetes mellitus with diabetic chronic kidney disease: Secondary | ICD-10-CM | POA: Diagnosis not present

## 2017-11-21 DIAGNOSIS — N183 Chronic kidney disease, stage 3 (moderate): Secondary | ICD-10-CM | POA: Diagnosis not present

## 2017-11-21 DIAGNOSIS — I129 Hypertensive chronic kidney disease with stage 1 through stage 4 chronic kidney disease, or unspecified chronic kidney disease: Secondary | ICD-10-CM | POA: Diagnosis not present

## 2017-11-21 DIAGNOSIS — Z87442 Personal history of urinary calculi: Secondary | ICD-10-CM | POA: Diagnosis not present

## 2017-11-24 ENCOUNTER — Other Ambulatory Visit: Payer: Self-pay | Admitting: Internal Medicine

## 2017-11-25 NOTE — Telephone Encounter (Signed)
Done erx 

## 2017-11-25 NOTE — Telephone Encounter (Signed)
05/02/2017 90# 

## 2017-12-03 ENCOUNTER — Other Ambulatory Visit: Payer: Self-pay | Admitting: Nephrology

## 2017-12-03 DIAGNOSIS — N183 Chronic kidney disease, stage 3 unspecified: Secondary | ICD-10-CM

## 2017-12-03 DIAGNOSIS — Z87442 Personal history of urinary calculi: Secondary | ICD-10-CM

## 2017-12-05 ENCOUNTER — Other Ambulatory Visit: Payer: Self-pay | Admitting: Endocrinology

## 2017-12-10 ENCOUNTER — Ambulatory Visit
Admission: RE | Admit: 2017-12-10 | Discharge: 2017-12-10 | Disposition: A | Discharge status: Home / Self Care | Payer: Medicare Other | Source: Ambulatory Visit | Attending: Nephrology | Admitting: Nephrology

## 2017-12-10 DIAGNOSIS — N183 Chronic kidney disease, stage 3 unspecified: Secondary | ICD-10-CM

## 2017-12-10 DIAGNOSIS — Z87442 Personal history of urinary calculi: Secondary | ICD-10-CM

## 2017-12-19 DIAGNOSIS — E119 Type 2 diabetes mellitus without complications: Secondary | ICD-10-CM | POA: Diagnosis not present

## 2017-12-19 DIAGNOSIS — H52203 Unspecified astigmatism, bilateral: Secondary | ICD-10-CM | POA: Diagnosis not present

## 2017-12-19 DIAGNOSIS — H2513 Age-related nuclear cataract, bilateral: Secondary | ICD-10-CM | POA: Diagnosis not present

## 2017-12-19 LAB — HM DIABETES EYE EXAM

## 2018-01-12 ENCOUNTER — Other Ambulatory Visit: Payer: Self-pay | Admitting: Internal Medicine

## 2018-01-15 ENCOUNTER — Other Ambulatory Visit: Payer: Self-pay | Admitting: Internal Medicine

## 2018-01-21 ENCOUNTER — Other Ambulatory Visit: Payer: Self-pay | Admitting: Endocrinology

## 2018-02-13 DIAGNOSIS — G4733 Obstructive sleep apnea (adult) (pediatric): Secondary | ICD-10-CM | POA: Diagnosis not present

## 2018-02-21 ENCOUNTER — Other Ambulatory Visit: Payer: Self-pay | Admitting: Internal Medicine

## 2018-02-23 DIAGNOSIS — G4733 Obstructive sleep apnea (adult) (pediatric): Secondary | ICD-10-CM | POA: Diagnosis not present

## 2018-03-02 ENCOUNTER — Ambulatory Visit (INDEPENDENT_AMBULATORY_CARE_PROVIDER_SITE_OTHER): Payer: Medicare Other

## 2018-03-02 DIAGNOSIS — Z23 Encounter for immunization: Secondary | ICD-10-CM | POA: Diagnosis not present

## 2018-03-02 NOTE — Progress Notes (Signed)
Per orders of Dr. Ellison injection of high dose flu given today by Laporchia Nakajima RMA . Patient tolerated injection well. 

## 2018-03-14 ENCOUNTER — Other Ambulatory Visit: Payer: Self-pay | Admitting: Internal Medicine

## 2018-03-17 DIAGNOSIS — G4733 Obstructive sleep apnea (adult) (pediatric): Secondary | ICD-10-CM | POA: Diagnosis not present

## 2018-03-20 DIAGNOSIS — I129 Hypertensive chronic kidney disease with stage 1 through stage 4 chronic kidney disease, or unspecified chronic kidney disease: Secondary | ICD-10-CM | POA: Diagnosis not present

## 2018-03-20 DIAGNOSIS — M109 Gout, unspecified: Secondary | ICD-10-CM | POA: Diagnosis not present

## 2018-03-20 DIAGNOSIS — Z87442 Personal history of urinary calculi: Secondary | ICD-10-CM | POA: Diagnosis not present

## 2018-03-20 DIAGNOSIS — N183 Chronic kidney disease, stage 3 (moderate): Secondary | ICD-10-CM | POA: Diagnosis not present

## 2018-03-20 DIAGNOSIS — E785 Hyperlipidemia, unspecified: Secondary | ICD-10-CM | POA: Diagnosis not present

## 2018-03-20 DIAGNOSIS — E1122 Type 2 diabetes mellitus with diabetic chronic kidney disease: Secondary | ICD-10-CM | POA: Diagnosis not present

## 2018-03-26 ENCOUNTER — Telehealth: Payer: Self-pay | Admitting: Internal Medicine

## 2018-03-26 ENCOUNTER — Other Ambulatory Visit: Payer: Self-pay | Admitting: Internal Medicine

## 2018-03-26 NOTE — Telephone Encounter (Signed)
Copied from [place] [id number]. Topic: General - Other >> Mar 26, 2018  2:35 PM Yvette Rack wrote: Reason for CRM: pt wife Arbie Cookey would like to speak with Dr Jenny Reichmann assistant about getting a RX Flexeril his Kidney Doctor told him that it would be best to get that medicine for back muscle spasms and that the Kidney Doctor doesn't write for that medicine please call wife Arbie Cookey at [phone]

## 2018-03-27 NOTE — Telephone Encounter (Signed)
Notified ptwife w/MD response. She states he has not been c/o abt his back today, but if he does will call back to make appt.Marland KitchenJohny Chess

## 2018-03-27 NOTE — Telephone Encounter (Signed)
Please let them know that Dr Jenny Reichmann is not in the office  And since I do not know him I do not feel comfortable prescribing him flexeril - he can wait until Dr Jenny Reichmann returns or come in to be seen.

## 2018-03-27 NOTE — Telephone Encounter (Signed)
MD is out of the office pls advise on msg below../lmb 

## 2018-03-30 ENCOUNTER — Ambulatory Visit: Payer: Medicare Other | Admitting: Podiatry

## 2018-03-30 ENCOUNTER — Ambulatory Visit (INDEPENDENT_AMBULATORY_CARE_PROVIDER_SITE_OTHER): Payer: Medicare Other

## 2018-03-30 ENCOUNTER — Encounter: Payer: Self-pay | Admitting: Podiatry

## 2018-03-30 DIAGNOSIS — M79671 Pain in right foot: Secondary | ICD-10-CM

## 2018-03-30 DIAGNOSIS — M109 Gout, unspecified: Secondary | ICD-10-CM | POA: Diagnosis not present

## 2018-03-30 NOTE — Patient Instructions (Signed)

## 2018-03-30 NOTE — Progress Notes (Signed)
Subjective:   Patient ID: Joseph Villa, male   DOB: 77 y.o.   MRN: [id number]   HPI Patient presents stating right foot is bothering me and that he is not having pain currently but was just concerned that he injured it   ROS      Objective:  Physical Exam  Neurovascular status intact with patient found to have discomfort of a mild nature plantar arch right with mild swelling of the dorsal surface of the metatarsal bones     Assessment:  Possibility for inflammatory fasciitis condition may have been acute versus the possibility for mild gout attack     Plan:  H&P condition reviewed and recommended stretching exercises supportive shoes and do not recommend more aggressive therapy unless symptoms were to come back again or get worse  X-rays were negative for signs for fracture or any indications of bony injury

## 2018-04-13 ENCOUNTER — Other Ambulatory Visit: Payer: Self-pay | Admitting: Internal Medicine

## 2018-04-16 DIAGNOSIS — G4733 Obstructive sleep apnea (adult) (pediatric): Secondary | ICD-10-CM | POA: Diagnosis not present

## 2018-04-17 ENCOUNTER — Other Ambulatory Visit: Payer: Self-pay | Admitting: Internal Medicine

## 2018-05-02 ENCOUNTER — Other Ambulatory Visit: Payer: Self-pay | Admitting: Endocrinology

## 2018-05-03 NOTE — Telephone Encounter (Signed)
Please refill x 1 Ov is due  

## 2018-05-04 ENCOUNTER — Telehealth: Payer: Self-pay

## 2018-05-04 ENCOUNTER — Other Ambulatory Visit (INDEPENDENT_AMBULATORY_CARE_PROVIDER_SITE_OTHER): Payer: Medicare Other

## 2018-05-04 DIAGNOSIS — E785 Hyperlipidemia, unspecified: Secondary | ICD-10-CM | POA: Diagnosis not present

## 2018-05-04 DIAGNOSIS — N182 Chronic kidney disease, stage 2 (mild): Secondary | ICD-10-CM

## 2018-05-04 DIAGNOSIS — E1122 Type 2 diabetes mellitus with diabetic chronic kidney disease: Secondary | ICD-10-CM | POA: Diagnosis not present

## 2018-05-04 LAB — LIPID PANEL
CHOL/HDL RATIO: 6
Cholesterol: 222 mg/dL — ABNORMAL HIGH (ref 0–200)
HDL: 37.8 mg/dL — ABNORMAL LOW (ref >39.00)
Triglycerides: 429 mg/dL — ABNORMAL HIGH (ref 0.0–149.0)

## 2018-05-04 LAB — BASIC METABOLIC PANEL
BUN: 34 mg/dL — AB (ref 6–23)
CALCIUM: 9.7 mg/dL (ref 8.4–10.5)
CO2: 29 mEq/L (ref 19–32)
Chloride: 103 mEq/L (ref 96–112)
Creatinine, Ser: 1.52 mg/dL — ABNORMAL HIGH (ref 0.40–1.50)
GFR: 47.75 mL/min — AB (ref >60.00)
GLUCOSE: 162 mg/dL — AB (ref 70–99)
Potassium: 4.9 mEq/L (ref 3.5–5.1)
Sodium: 139 mEq/L (ref 135–145)

## 2018-05-04 LAB — LDL CHOLESTEROL, DIRECT: Direct LDL: 146 mg/dL

## 2018-05-04 LAB — HEMOGLOBIN A1C: Hgb A1c MFr Bld: 6.6 % — ABNORMAL HIGH (ref 4.6–6.5)

## 2018-05-04 NOTE — Telephone Encounter (Signed)
Lab needed orders entered for upcoming follow up appt.

## 2018-05-06 ENCOUNTER — Other Ambulatory Visit: Payer: Self-pay | Admitting: Podiatry

## 2018-05-06 DIAGNOSIS — M109 Gout, unspecified: Secondary | ICD-10-CM

## 2018-05-08 ENCOUNTER — Encounter: Payer: Self-pay | Admitting: Internal Medicine

## 2018-05-08 ENCOUNTER — Ambulatory Visit: Payer: Medicare Other | Admitting: Internal Medicine

## 2018-05-08 VITALS — BP 128/84 | HR 69 | Temp 98.0°F | Ht 70.5 in | Wt 257.0 lb

## 2018-05-08 DIAGNOSIS — I10 Essential (primary) hypertension: Secondary | ICD-10-CM

## 2018-05-08 DIAGNOSIS — E785 Hyperlipidemia, unspecified: Secondary | ICD-10-CM | POA: Diagnosis not present

## 2018-05-08 DIAGNOSIS — E1122 Type 2 diabetes mellitus with diabetic chronic kidney disease: Secondary | ICD-10-CM

## 2018-05-08 DIAGNOSIS — N182 Chronic kidney disease, stage 2 (mild): Secondary | ICD-10-CM

## 2018-05-08 DIAGNOSIS — Z0000 Encounter for general adult medical examination without abnormal findings: Secondary | ICD-10-CM

## 2018-05-08 MED ORDER — PANTOPRAZOLE SODIUM 40 MG PO TBEC: DELAYED_RELEASE_TABLET | ORAL | 3 refills | Status: DC

## 2018-05-08 MED ORDER — NATEGLINIDE 60 MG PO TABS: ORAL_TABLET | ORAL | 3 refills | Status: DC

## 2018-05-08 MED ORDER — TAMSULOSIN HCL 0.4 MG PO CAPS: ORAL_CAPSULE | ORAL | 3 refills | Status: DC

## 2018-05-08 MED ORDER — CLOBETASOL PROPIONATE 0.05 % EX CREA: 1.0000 "application " | TOPICAL_CREAM | Freq: Two times a day (BID) | CUTANEOUS | 2 refills | Status: DC | PRN

## 2018-05-08 MED ORDER — CARBAMAZEPINE 200 MG PO TABS: 400.0000 mg | ORAL_TABLET | Freq: Every day | ORAL | 3 refills | Status: DC

## 2018-05-08 MED ORDER — OLMESARTAN MEDOXOMIL 40 MG PO TABS: ORAL_TABLET | ORAL | 3 refills | Status: DC

## 2018-05-08 MED ORDER — FENOFIBRATE 145 MG PO TABS: 145.0000 mg | ORAL_TABLET | Freq: Every day | ORAL | 3 refills | Status: DC

## 2018-05-08 MED ORDER — HYDROCHLOROTHIAZIDE 25 MG PO TABS: ORAL_TABLET | ORAL | 3 refills | Status: DC

## 2018-05-08 MED ORDER — SITAGLIPTIN PHOSPHATE 100 MG PO TABS: 50.0000 mg | ORAL_TABLET | Freq: Every day | ORAL | 3 refills | Status: DC

## 2018-05-08 NOTE — Assessment & Plan Note (Signed)
stable overall by history and exam, recent data reviewed with pt, and pt to continue medical treatment as before,  to f/u any worsening symptoms or concerns  

## 2018-05-08 NOTE — Assessment & Plan Note (Signed)
stable overall by history and exam, recent data reviewed with pt, and pt to continue medical treatment as before,  to f/u any worsening symptoms or concerns, to start the fenofibrate, , statin intolerant,f/u lab next visit

## 2018-05-08 NOTE — Patient Instructions (Addendum)
Please take all new medication as prescribed - the fenofibrate 145 mg per day  Please continue all other medications as before, and refills have been done if requested.  Please have the pharmacy call with any other refills you may need.  Please continue your efforts at being more active, low cholesterol diet, and weight control.  Please keep your appointments with your specialists as you may have planned  Please return in 6 months, or sooner if needed, with Lab testing done 3-5 days before

## 2018-05-08 NOTE — Progress Notes (Addendum)
Subjective:    Patient ID: Joseph Villa, male    DOB: [birth year], 77 y.o.   MRN: [id number]  HPI  Here to f/u; overall doing ok,  Pt denies chest pain, increasing sob or doe, wheezing, orthopnea, PND, increased LE swelling, palpitations, dizziness or syncope.  Pt denies new neurological symptoms such as new headache, or facial or extremity weakness or numbness.  Pt denies polydipsia, polyuria, or low sugar episode.  Pt states overall good compliance with meds, mostly trying to follow appropriate diet, with wt overall stable,  but little exercise however.    Has done much better with less red meat and completely stopped the 21 eggs/wk, eating oatmeal instead.  Not taking the fenofibrate or crestor BP Readings from Last 3 Encounters:  05/08/18 128/84  11/13/17 (!) 113/57  10/31/17 132/78   Past Medical History:  Diagnosis Date  . ALLERGIC ASTHMA 11/25/2007  . ALLERGIC RHINITIS 11/25/2007  . Allergy   . Arthritis, lumbar spine 04/18/2011  . BIPOLAR DISORDER UNSPECIFIED 10/06/2009  . Blood transfusion without reported diagnosis    s/p goiter surgery age 60   . COLONIC POLYPS, HX OF 10/06/2009  . Degenerative arthritis of hip 04/18/2011  . DJD (degenerative joint disease)   . DM w/o Complication Type II 17/12/9388  . GERD 10/06/2009  . Hemorrhage yrs ago   after goiter surgery, tracheostomy inserted and later removed  . Hemorrhoid   . HYPERLIPIDEMIA 10/06/2009  . HYPERTENSION, BENIGN 07/27/2009  . HYPOTHYROIDISM 10/06/2009  . NEPHROLITHIASIS, HX OF 10/06/2009  . OBSTRUCTIVE SLEEP APNEA 11/25/2007   cpap setting of 9  . Peptic stricture of esophagus   . RBBB (right bundle branch block)   . Sleep apnea    wears c pap   Past Surgical History:  Procedure Laterality Date  . COLONOSCOPY    . GANGLION CYST EXCISION     2-3 cysts removed  . thyroid Goiter surgery    . TONSILLECTOMY    . TOTAL HIP ARTHROPLASTY  06/16/2012   Procedure: TOTAL HIP ARTHROPLASTY ANTERIOR APPROACH;  Surgeon: Mauri Pole, MD;  Location: [facility];  Service: Orthopedics;  Laterality: Left;  . UMBILICAL HERNIA REPAIR  several yrs ago  . UPPER GASTROINTESTINAL ENDOSCOPY      reports that he quit smoking about 40 years ago. His smoking use included cigarettes and pipe. He has a 22.50 pack-year smoking history. He has never used smokeless tobacco. He reports that he does not drink alcohol or use drugs. family history includes Alcohol abuse in his brother; Alcoholism in his unknown relative; Breast cancer in his father; Diabetes in his brother; Drug abuse in his daughter; Throat cancer in his mother. Allergies  Allergen Reactions  . Cefuroxime Axetil Anaphylaxis    REACTION: anaphylaxis-ceftin   . Cefprozil     REACTION: unknown  . Cephalosporins Nausea Only  . Crestor [Rosuvastatin Calcium]   . Lipitor [Atorvastatin]     myalgiaas  . Metformin And Related   . Rabeprazole Sodium Nausea Only    REACTION: nausea  . Statins    Current Outpatient Medications on File Prior to Visit  Medication Sig Dispense Refill  . ACCU-CHEK AVIVA PLUS test strip CHECK BLOOD GLUCOSE (SUGAR) TWICE DAILY 200 each 1  . ACCU-CHEK FASTCLIX LANCETS MISC USE TWICE DAILY 102 each 5  . ADVAIR DISKUS 250-50 MCG/DOSE AEPB INHALE 1 PUFF AND RINSE MOUTH TWICE DAILY. MAINTENANCE INHALER 180 each 1  . aspirin 81 MG chewable tablet Chew 81 mg by  mouth daily.    . Blood Glucose Monitoring Suppl (ACCU-CHEK AVIVA PLUS) W/DEVICE KIT AS DIRECTED 1 kit 0  . bromocriptine (PARLODEL) 2.5 MG tablet TAKE ONE-HALF TABLET DAILY 15 tablet 0  . carbamazepine (TEGRETOL) 200 MG tablet TAKE 2 TABLETS AT BEDTIME 180 tablet 2  . Cholecalciferol (VITAMIN D3) 2000 UNITS TABS Take 2,000 Units by mouth every morning.     . clobetasol cream (TEMOVATE) 3.54 % Apply 1 application topically 2 (two) times daily as needed. For eczema.    . Coenzyme Q10 (CO Q 10) 100 MG CAPS Take 200 mg by mouth daily.    Marland Kitchen desonide (DESOWEN) 0.05 % cream Apply 1 application topically  2 (two) times daily.    Marland Kitchen doxycycline (ADOXA) 150 MG tablet Take 150 mg by mouth 2 (two) times daily.    . fenofibrate (TRICOR) 145 MG tablet Take 1 tablet (145 mg total) by mouth daily. 90 tablet 3  . fluticasone (FLONASE) 50 MCG/ACT nasal spray ONE SPRAY INTO NOSE EVERY DAY 48 g 1  . glucosamine-chondroitin 500-400 MG tablet Take 1 tablet by mouth every morning.     Marland Kitchen glucose blood (ACCU-CHEK AVIVA PLUS) test strip Used to check blood sugars twice a day Dx E11.9 200 each 0  . glucose blood test strip Use to check blood sugar 2 times per day. DX Code: E11.9 200 each 2  . hydrochlorothiazide (HYDRODIURIL) 25 MG tablet TAKE ONE TABLET EACH DAY 90 tablet 1  . hydrocortisone 2.5 % lotion As directed    . Lancets Misc. (ACCU-CHEK FASTCLIX LANCET) KIT AS DIRECTED 1 kit 0  . levothyroxine (SYNTHROID, LEVOTHROID) 112 MCG tablet TAKE ONE TABLET DAILY 90 tablet 1  . Multiple Vitamins-Minerals (ICAPS AREDS FORMULA PO) Take 1 capsule by mouth daily.    . Na Sulfate-K Sulfate-Mg Sulf SOLN Take 1 kit by mouth once. 354 mL 0  . nateglinide (STARLIX) 60 MG tablet TAKE ONE-HALF TABLET 3 TIMES DAILY WITH MEALS 45 tablet 11  . nateglinide (STARLIX) 60 MG tablet TAKE ONE-HALF TABLET 3 TIMES DAILY WITH MEALS 45 tablet 11  . olmesartan (BENICAR) 40 MG tablet TAKE ONE TABLET EACH DAY 90 tablet 3  . Omega-3 Fatty Acids (FISH OIL) 1200 MG CAPS Take 1 capsule by mouth daily.    Marland Kitchen OVER THE COUNTER MEDICATION Take 1 tablet by mouth daily. Tumeric    . pantoprazole (PROTONIX) 40 MG tablet TAKE ONE TABLET EACH DAY 30 tablet 5  . pantoprazole (PROTONIX) 40 MG tablet TAKE ONE TABLET EACH DAY 90 tablet 0  . PROAIR HFA 108 (90 BASE) MCG/ACT inhaler 2 PUFFS FOUR TIMES DAILY AS NEEDED 8.5 g 0  . PROCTOZONE-HC 2.5 % rectal cream APPLY TWICE DAILY RECTALLY 30 g 0  . rosuvastatin (CRESTOR) 20 MG tablet Take 1 tablet (20 mg total) by mouth daily. 90 tablet 3  . sitaGLIPtin (JANUVIA) 100 MG tablet Take 0.5 tablets (50 mg total) by  mouth daily. 45 tablet 3  . sulfamethoxazole-trimethoprim (BACTRIM,SEPTRA) 400-80 MG tablet Take 1 tablet by mouth 2 (two) times daily.    . tamsulosin (FLOMAX) 0.4 MG CAPS capsule TAKE ONE CAPSULE EACH DAY 90 capsule 1  . traMADol (ULTRAM) 50 MG tablet TAKE ONE TABLET EVERY EIGHT HOURS AS NEEDED 90 tablet 2   No current facility-administered medications on file prior to visit.    Review of Systems  Constitutional: Negative for other unusual diaphoresis or sweats HENT: Negative for ear discharge or swelling Eyes: Negative for other worsening visual disturbances  Respiratory: Negative for stridor or other swelling  Gastrointestinal: Negative for worsening distension or other blood Genitourinary: Negative for retention or other urinary change Musculoskeletal: Negative for other MSK pain or swelling Skin: Negative for color change or other new lesions Neurological: Negative for worsening tremors and other numbness  Psychiatric/Behavioral: Negative for worsening agitation or other fatigue All other system neg pre pt    Objective:   Physical Exam BP 128/84   Pulse 69   Temp 98 F (36.7 C) (Oral)   Ht 5' 10.5" (1.791 m)   Wt 257 lb (116.6 kg)   SpO2 95%   BMI 36.35 kg/m  VS noted,  Constitutional: Pt appears in NAD HENT: Head: NCAT.  Right Ear: External ear normal.  Left Ear: External ear normal.  Eyes: . Pupils are equal, round, and reactive to light. Conjunctivae and EOM are normal Nose: without d/c or deformity Neck: Neck supple. Gross normal ROM Cardiovascular: Normal rate and regular rhythm.   Pulmonary/Chest: Effort normal and breath sounds without rales or wheezing.  Abd:  Soft, NT, ND, + BS, no organomegaly Neurological: Pt is alert. At baseline orientation, motor grossly intact Skin: Skin is warm. No rashes, other new lesions, no LE edema Psychiatric: Pt behavior is normal without agitation  No other exam findings  Lab Results  Component Value Date   WBC 5.6  11/13/2017   HGB 12.3 (L) 11/13/2017   HCT 36.1 (L) 11/13/2017   PLT 224 11/13/2017   GLUCOSE 162 (H) 05/04/2018   CHOL 222 (H) 05/04/2018   TRIG (H) 05/04/2018    429.0 Triglyceride is over 400; calculations on Lipids are invalid.   HDL 37.80 (L) 05/04/2018   LDLDIRECT 146.0 05/04/2018   LDLCALC 87 12/24/2013   ALT 16 10/24/2017   AST 15 10/24/2017   NA 139 05/04/2018   K 4.9 05/04/2018   CL 103 05/04/2018   CREATININE 1.52 (H) 05/04/2018   BUN 34 (H) 05/04/2018   CO2 29 05/04/2018   TSH 4.64 (H) 10/24/2017   PSA 0.57 10/24/2017   INR 0.99 06/08/2012   HGBA1C 6.6 (H) 05/04/2018   MICROALBUR 4.2 (H) 10/24/2017          Assessment & Plan:

## 2018-05-13 ENCOUNTER — Encounter: Payer: Self-pay | Admitting: Endocrinology

## 2018-05-13 ENCOUNTER — Ambulatory Visit: Payer: Medicare Other | Admitting: Endocrinology

## 2018-05-13 VITALS — BP 122/58 | HR 74 | Ht 70.5 in | Wt 259.2 lb

## 2018-05-13 DIAGNOSIS — G4733 Obstructive sleep apnea (adult) (pediatric): Secondary | ICD-10-CM

## 2018-05-13 NOTE — Progress Notes (Signed)
   Subjective:    Patient ID: Joseph Villa, male    DOB: 1940/11/05, 77 y.o.   MRN: [id number]  HPI Pt returns for f/u of diabetes mellitus:  DM type: 2 Dx'ed: 2007.   Complications: polyneuropathy and renal insufficiency.   Therapy: 3 oral meds.  DKA: never.   Severe hypoglycemia: never.  Pancreatitis: never.  Other: he has never been on insulin, but he has learned how; he did not tolerate metformin-XR (diarrhea); he cannot take pioglitizone, due to edema; renal insufficiency also limits oral rx options. He did not tolerate repaglinide (constipation).   Interval history: He says cbg's are in the low-100's.  pt states he feels well in general.     Review of Systems He denies hypoglycemia.      Objective:   Physical Exam VITAL SIGNS:  See vs page GENERAL: no distress Pulses: dorsalis pedis intact bilat.   MSK: no deformity of the feet CV: trace bilat leg edema Skin:  no ulcer on the feet.  normal color and temp on the feet. Neuro: sensation is intact to touch on the feet, but decreased from normal     Lab Results  Component Value Date   HGBA1C 6.6 (H) 05/04/2018   Lab Results  Component Value Date   CREATININE 1.52 (H) 05/04/2018   BUN 34 (H) 05/04/2018   NA 139 05/04/2018   K 4.9 05/04/2018   CL 103 05/04/2018   CO2 29 05/04/2018      Assessment & Plan:  Type 2 DM, with renal insuff: well-controlled Edema: this limits rx options  Patient Instructions  Please continue the same diabetes medications. check your blood sugar once a day.  vary the time of day when you check, between before the 3 meals, and at bedtime.  also check if you have symptoms of your blood sugar being too high or too low.  please keep a record of the readings and bring it to your next appointment here (or you can bring the meter itself).  You can write it on any piece of paper.  please call us sooner if your blood sugar goes below 70, or if you have a lot of readings over 200. Please come back  for a follow-up appointment in 4 months.

## 2018-05-13 NOTE — Patient Instructions (Signed)
Please continue the same diabetes medications.   check your blood sugar once a day.  vary the time of day when you check, between before the 3 meals, and at bedtime.  also check if you have symptoms of your blood sugar being too high or too low.  please keep a record of the readings and bring it to your next appointment here (or you can bring the meter itself).  You can write it on any piece of paper.  please call us sooner if your blood sugar goes below 70, or if you have a lot of readings over 200.   Please come back for a follow-up appointment in 4 months.   

## 2018-05-18 DIAGNOSIS — G4733 Obstructive sleep apnea (adult) (pediatric): Secondary | ICD-10-CM | POA: Diagnosis not present

## 2018-05-20 ENCOUNTER — Other Ambulatory Visit: Payer: Self-pay | Admitting: Internal Medicine

## 2018-05-25 ENCOUNTER — Other Ambulatory Visit: Payer: Self-pay | Admitting: Internal Medicine

## 2018-06-05 ENCOUNTER — Other Ambulatory Visit: Payer: Self-pay | Admitting: Endocrinology

## 2018-07-07 ENCOUNTER — Other Ambulatory Visit: Payer: Self-pay | Admitting: Endocrinology

## 2018-08-07 ENCOUNTER — Other Ambulatory Visit: Payer: Self-pay | Admitting: Endocrinology

## 2018-09-04 ENCOUNTER — Other Ambulatory Visit: Payer: Self-pay | Admitting: Endocrinology

## 2018-09-04 ENCOUNTER — Other Ambulatory Visit: Payer: Self-pay | Admitting: Internal Medicine

## 2018-09-15 DIAGNOSIS — G4733 Obstructive sleep apnea (adult) (pediatric): Secondary | ICD-10-CM | POA: Diagnosis not present

## 2018-10-06 ENCOUNTER — Other Ambulatory Visit: Payer: Self-pay | Admitting: Endocrinology

## 2018-10-30 ENCOUNTER — Other Ambulatory Visit: Payer: Self-pay | Admitting: Endocrinology

## 2018-11-06 ENCOUNTER — Ambulatory Visit: Payer: Medicare Other | Admitting: Endocrinology

## 2018-11-09 ENCOUNTER — Other Ambulatory Visit (INDEPENDENT_AMBULATORY_CARE_PROVIDER_SITE_OTHER): Payer: Medicare Other

## 2018-11-09 DIAGNOSIS — Z125 Encounter for screening for malignant neoplasm of prostate: Secondary | ICD-10-CM

## 2018-11-09 DIAGNOSIS — E1122 Type 2 diabetes mellitus with diabetic chronic kidney disease: Secondary | ICD-10-CM

## 2018-11-09 DIAGNOSIS — N182 Chronic kidney disease, stage 2 (mild): Secondary | ICD-10-CM | POA: Diagnosis not present

## 2018-11-09 DIAGNOSIS — Z0000 Encounter for general adult medical examination without abnormal findings: Secondary | ICD-10-CM

## 2018-11-09 LAB — CBC WITH DIFFERENTIAL/PLATELET
Basophils Absolute: 0.1 10*3/uL (ref 0.0–0.1)
Basophils Relative: 1.1 % (ref 0.0–3.0)
Eosinophils Absolute: 0.4 10*3/uL (ref 0.0–0.7)
Eosinophils Relative: 5.2 % — ABNORMAL HIGH (ref 0.0–5.0)
HCT: 38.6 % — ABNORMAL LOW (ref 39.0–52.0)
Hemoglobin: 12.9 g/dL — ABNORMAL LOW (ref 13.0–17.0)
Lymphocytes Relative: 29.1 % (ref 12.0–46.0)
Lymphs Abs: 2.1 10*3/uL (ref 0.7–4.0)
MCHC: 33.5 g/dL (ref 30.0–36.0)
MCV: 91.6 fl (ref 78.0–100.0)
Monocytes Absolute: 0.6 10*3/uL (ref 0.1–1.0)
Monocytes Relative: 9 % (ref 3.0–12.0)
Neutro Abs: 3.9 10*3/uL (ref 1.4–7.7)
Neutrophils Relative %: 55.6 % (ref 43.0–77.0)
Platelets: 215 10*3/uL (ref 150.0–400.0)
RBC: 4.21 Mil/uL — ABNORMAL LOW (ref 4.22–5.81)
RDW: 13.4 % (ref 11.5–15.5)
WBC: 7.1 10*3/uL (ref 4.0–10.5)

## 2018-11-09 LAB — URINALYSIS, ROUTINE W REFLEX MICROSCOPIC
Bilirubin Urine: NEGATIVE
Hgb urine dipstick: NEGATIVE
Ketones, ur: NEGATIVE
Leukocytes,Ua: NEGATIVE
Nitrite: NEGATIVE
RBC / HPF: NONE SEEN (ref 0–?)
Specific Gravity, Urine: 1.02 (ref 1.000–1.030)
Total Protein, Urine: NEGATIVE
Urine Glucose: NEGATIVE
Urobilinogen, UA: 0.2 (ref 0.0–1.0)
WBC, UA: NONE SEEN (ref 0–?)
pH: 6 (ref 5.0–8.0)

## 2018-11-09 LAB — LIPID PANEL
Cholesterol: 244 mg/dL — ABNORMAL HIGH (ref 0–200)
HDL: 37.6 mg/dL — ABNORMAL LOW (ref >39.00)
NonHDL: 206.63
Total CHOL/HDL Ratio: 6
Triglycerides: 394 mg/dL — ABNORMAL HIGH (ref 0.0–149.0)
VLDL: 78.8 mg/dL — ABNORMAL HIGH (ref 0.0–40.0)

## 2018-11-09 LAB — BASIC METABOLIC PANEL
BUN: 37 mg/dL — ABNORMAL HIGH (ref 6–23)
CO2: 26 mEq/L (ref 19–32)
Calcium: 9.4 mg/dL (ref 8.4–10.5)
Chloride: 105 mEq/L (ref 96–112)
Creatinine, Ser: 1.61 mg/dL — ABNORMAL HIGH (ref 0.40–1.50)
GFR: 41.98 mL/min — ABNORMAL LOW (ref >60.00)
Glucose, Bld: 145 mg/dL — ABNORMAL HIGH (ref 70–99)
Potassium: 4.8 mEq/L (ref 3.5–5.1)
Sodium: 140 mEq/L (ref 135–145)

## 2018-11-09 LAB — PSA: PSA: 1 ng/mL (ref 0.10–4.00)

## 2018-11-09 LAB — HEPATIC FUNCTION PANEL
ALT: 17 U/L (ref 0–53)
AST: 15 U/L (ref 0–37)
Albumin: 4.2 g/dL (ref 3.5–5.2)
Alkaline Phosphatase: 70 U/L (ref 39–117)
Bilirubin, Direct: 0.1 mg/dL (ref 0.0–0.3)
Total Bilirubin: 0.3 mg/dL (ref 0.2–1.2)
Total Protein: 6.8 g/dL (ref 6.0–8.3)

## 2018-11-09 LAB — HEMOGLOBIN A1C: Hgb A1c MFr Bld: 6.9 % — ABNORMAL HIGH (ref 4.6–6.5)

## 2018-11-09 LAB — MICROALBUMIN / CREATININE URINE RATIO
Creatinine,U: 104.9 mg/dL
Microalb Creat Ratio: 2.8 mg/g (ref 0.0–30.0)
Microalb, Ur: 3 mg/dL — ABNORMAL HIGH (ref 0.0–1.9)

## 2018-11-09 LAB — TSH: TSH: 4.07 u[IU]/mL (ref 0.35–4.50)

## 2018-11-09 LAB — LDL CHOLESTEROL, DIRECT: Direct LDL: 174 mg/dL

## 2018-11-13 ENCOUNTER — Ambulatory Visit (INDEPENDENT_AMBULATORY_CARE_PROVIDER_SITE_OTHER): Payer: Medicare Other | Admitting: Internal Medicine

## 2018-11-13 ENCOUNTER — Other Ambulatory Visit: Payer: Self-pay

## 2018-11-13 ENCOUNTER — Encounter: Payer: Self-pay | Admitting: Internal Medicine

## 2018-11-13 VITALS — BP 144/82 | HR 75 | Temp 98.3°F | Ht 70.5 in | Wt 254.0 lb

## 2018-11-13 DIAGNOSIS — R21 Rash and other nonspecific skin eruption: Secondary | ICD-10-CM

## 2018-11-13 DIAGNOSIS — L309 Dermatitis, unspecified: Secondary | ICD-10-CM | POA: Diagnosis not present

## 2018-11-13 DIAGNOSIS — Z0001 Encounter for general adult medical examination with abnormal findings: Secondary | ICD-10-CM

## 2018-11-13 DIAGNOSIS — N183 Chronic kidney disease, stage 3 unspecified: Secondary | ICD-10-CM

## 2018-11-13 DIAGNOSIS — I10 Essential (primary) hypertension: Secondary | ICD-10-CM | POA: Diagnosis not present

## 2018-11-13 DIAGNOSIS — E1122 Type 2 diabetes mellitus with diabetic chronic kidney disease: Secondary | ICD-10-CM

## 2018-11-13 DIAGNOSIS — N182 Chronic kidney disease, stage 2 (mild): Secondary | ICD-10-CM

## 2018-11-13 MED ORDER — DESOXIMETASONE 0.25 % EX CREA: 1.0000 "application " | TOPICAL_CREAM | Freq: Two times a day (BID) | CUTANEOUS | 0 refills | Status: DC

## 2018-11-13 MED ORDER — KETOCONAZOLE 2 % EX CREA: 1.0000 "application " | TOPICAL_CREAM | Freq: Every day | CUTANEOUS | 1 refills | Status: DC

## 2018-11-13 NOTE — Assessment & Plan Note (Signed)
stable overall by history and exam, recent data reviewed with pt, and pt to continue medical treatment as before,  to f/u any worsening symptoms or concerns, to f/u with endo as planned

## 2018-11-13 NOTE — Progress Notes (Signed)
Subjective:    Patient ID: Joseph Villa, male    DOB: 1941/01/25, 78 y.o.   MRN: [id number]  HPI  Here for wellness and f/u;  Overall doing ok;  Pt denies Chest pain, worsening SOB, DOE, wheezing, orthopnea, PND, worsening LE edema, palpitations, dizziness or syncope.  Pt denies neurological change such as new headache, facial or extremity weakness.  Pt denies polydipsia, polyuria, or low sugar symptoms. Pt states overall good compliance with treatment and medications, good tolerability, and has been trying to follow appropriate diet.  Pt denies worsening depressive symptoms, suicidal ideation or panic. No fever, night sweats, wt loss, loss of appetite, or other constitutional symptoms.  Pt states good ability with ADL's, has low fall risk, home safety reviewed and adequate, no other significant changes in hearing or vision, and only occasionally active with exercise  Nervous about coming in today and risk of getting COVID19 while here.   BP Readings from Last 3 Encounters:  11/13/18 (!) 144/82  05/13/18 (!) 122/58  05/08/18 128/84  Also with c/o diffuse dramatic eczema type rash to torso and arms with itching for several years, without pain or swelling or ulcer, mod, constant, nothing seems to make better or worse except the topicort he has taken in the past, now out Also with c/o fungal rash to groin for several months with warm weather and sweating, better with lotrimin otc somewhat , but keeps coming back, itchy and mild discomfort, constant Past Medical History:  Diagnosis Date  . ALLERGIC ASTHMA 11/25/2007  . ALLERGIC RHINITIS 11/25/2007  . Allergy   . Arthritis, lumbar spine 04/18/2011  . BIPOLAR DISORDER UNSPECIFIED 10/06/2009  . Blood transfusion without reported diagnosis    s/p goiter surgery age 41   . COLONIC POLYPS, HX OF 10/06/2009  . Degenerative arthritis of hip 04/18/2011  . DJD (degenerative joint disease)   . DM w/o Complication Type II 99/01/3381  . GERD 10/06/2009  .  Hemorrhage yrs ago   after goiter surgery, tracheostomy inserted and later removed  . Hemorrhoid   . HYPERLIPIDEMIA 10/06/2009  . HYPERTENSION, BENIGN 07/27/2009  . HYPOTHYROIDISM 10/06/2009  . NEPHROLITHIASIS, HX OF 10/06/2009  . OBSTRUCTIVE SLEEP APNEA 11/25/2007   cpap setting of 9  . Peptic stricture of esophagus   . RBBB (right bundle branch block)   . Sleep apnea    wears c pap   Past Surgical History:  Procedure Laterality Date  . COLONOSCOPY    . GANGLION CYST EXCISION     2-3 cysts removed  . thyroid Goiter surgery    . TONSILLECTOMY    . TOTAL HIP ARTHROPLASTY  06/16/2012   Procedure: TOTAL HIP ARTHROPLASTY ANTERIOR APPROACH;  Surgeon: Mauri Pole, MD;  Location: [facility];  Service: Orthopedics;  Laterality: Left;  . UMBILICAL HERNIA REPAIR  several yrs ago  . UPPER GASTROINTESTINAL ENDOSCOPY      reports that he quit smoking about 41 years ago. His smoking use included cigarettes and pipe. He has a 22.50 pack-year smoking history. He has never used smokeless tobacco. He reports that he does not drink alcohol or use drugs. family history includes Alcohol abuse in his brother; Alcoholism in his unknown relative; Breast cancer in his father; Diabetes in his brother; Drug abuse in his daughter; Throat cancer in his mother. Allergies  Allergen Reactions  . Cefuroxime Axetil Anaphylaxis    REACTION: anaphylaxis-ceftin   . Cefprozil     REACTION: unknown  . Cephalosporins Nausea Only  .  Crestor [Rosuvastatin Calcium]   . Lipitor [Atorvastatin]     myalgiaas  . Metformin And Related   . Rabeprazole Sodium Nausea Only    REACTION: nausea  . Statins    Current Outpatient Medications on File Prior to Visit  Medication Sig Dispense Refill  . ACCU-CHEK AVIVA PLUS test strip CHECK BLOOD GLUCOSE (SUGAR) TWICE DAILY 200 each 1  . ACCU-CHEK FASTCLIX LANCETS MISC USE TWICE DAILY 102 each 5  . ADVAIR DISKUS 250-50 MCG/DOSE AEPB INHALE 1 PUFF AND RINSE MOUTH TWICE DAILY. MAINTENANCE  INHALER 180 each 1  . aspirin 81 MG chewable tablet Chew 81 mg by mouth daily.    . Blood Glucose Monitoring Suppl (ACCU-CHEK AVIVA PLUS) W/DEVICE KIT AS DIRECTED 1 kit 0  . bromocriptine (PARLODEL) 2.5 MG tablet TAKE ONE-HALF TABLET DAILY 15 tablet 0  . carbamazepine (TEGRETOL) 200 MG tablet Take 2 tablets (400 mg total) by mouth at bedtime. 180 tablet 3  . Cholecalciferol (VITAMIN D3) 2000 UNITS TABS Take 2,000 Units by mouth every morning.     . clobetasol cream (TEMOVATE) 0.30 % Apply 1 application topically 2 (two) times daily as needed. For eczema. 30 g 2  . Coenzyme Q10 (CO Q 10) 100 MG CAPS Take 200 mg by mouth daily.    Marland Kitchen desonide (DESOWEN) 0.05 % cream Apply 1 application topically 2 (two) times daily.    Marland Kitchen doxycycline (ADOXA) 150 MG tablet Take 150 mg by mouth 2 (two) times daily.    . fenofibrate (TRICOR) 145 MG tablet Take 1 tablet (145 mg total) by mouth daily. 90 tablet 3  . fluticasone (FLONASE) 50 MCG/ACT nasal spray ONE SPRAY INTO NOSE EVERY DAY 48 g 1  . glucosamine-chondroitin 500-400 MG tablet Take 1 tablet by mouth every morning.     Marland Kitchen glucose blood (ACCU-CHEK AVIVA PLUS) test strip Used to check blood sugars twice a day Dx E11.9 200 each 0  . glucose blood test strip Use to check blood sugar 2 times per day. DX Code: E11.9 200 each 2  . hydrochlorothiazide (HYDRODIURIL) 25 MG tablet TAKE ONE TABLET EACH DAY 90 tablet 3  . hydrocortisone 2.5 % lotion As directed    . Lancets Misc. (ACCU-CHEK FASTCLIX LANCET) KIT AS DIRECTED 1 kit 0  . levothyroxine (SYNTHROID, LEVOTHROID) 112 MCG tablet TAKE ONE TABLET DAILY 90 tablet 1  . Multiple Vitamins-Minerals (ICAPS AREDS FORMULA PO) Take 1 capsule by mouth daily.    . Na Sulfate-K Sulfate-Mg Sulf SOLN Take 1 kit by mouth once. 354 mL 0  . nateglinide (STARLIX) 60 MG tablet TAKE ONE-HALF TABLET 3 TIMES DAILY WITH MEALS 45 tablet 11  . olmesartan (BENICAR) 40 MG tablet TAKE ONE TABLET EACH DAY 90 tablet 3  . Omega-3 Fatty Acids  (FISH OIL) 1200 MG CAPS Take 1 capsule by mouth daily.    Marland Kitchen OVER THE COUNTER MEDICATION Take 1 tablet by mouth daily. Tumeric    . pantoprazole (PROTONIX) 40 MG tablet TAKE ONE TABLET EACH DAY 90 tablet 0  . pantoprazole (PROTONIX) 40 MG tablet TAKE ONE TABLET EACH DAY 90 tablet 3  . PROAIR HFA 108 (90 Base) MCG/ACT inhaler 2 PUFFS FOUR TIMES DAILY AS NEEDED 8.5 g 0  . PROCTOZONE-HC 2.5 % rectal cream APPLY TWICE DAILY RECTALLY 30 g 0  . sitaGLIPtin (JANUVIA) 100 MG tablet Take 0.5 tablets (50 mg total) by mouth daily. 45 tablet 3  . sulfamethoxazole-trimethoprim (BACTRIM,SEPTRA) 400-80 MG tablet Take 1 tablet by mouth 2 (two) times  daily.    . tamsulosin (FLOMAX) 0.4 MG CAPS capsule TAKE ONE CAPSULE EACH DAY 90 capsule 3  . traMADol (ULTRAM) 50 MG tablet TAKE ONE TABLET EVERY EIGHT HOURS AS NEEDED 90 tablet 2   No current facility-administered medications on file prior to visit.    Review of Systems Constitutional: Negative for other unusual diaphoresis, sweats, appetite or weight changes HENT: Negative for other worsening hearing loss, ear pain, facial swelling, mouth sores or neck stiffness.   Eyes: Negative for other worsening pain, redness or other visual disturbance.  Respiratory: Negative for other stridor or swelling Cardiovascular: Negative for other palpitations or other chest pain  Gastrointestinal: Negative for worsening diarrhea or loose stools, blood in stool, distention or other pain Genitourinary: Negative for hematuria, flank pain or other change in urine volume.  Musculoskeletal: Negative for myalgias or other joint swelling.  Skin: Negative for other color change, or other wound or worsening drainage.  Neurological: Negative for other syncope or numbness. Hematological: Negative for other adenopathy or swelling Psychiatric/Behavioral: Negative for hallucinations, other worsening agitation, SI, self-injury, or new decreased concentration All other system neg per pt     Objective:   Physical Exam BP (!) 144/82   Pulse 75   Temp 98.3 F (36.8 C) (Oral)   Ht 5' 10.5" (1.791 m)   Wt 254 lb (115.2 kg)   SpO2 96%   BMI 35.93 kg/m  .VS noted,  Constitutional: Pt is oriented to person, place, and time. Appears well-developed and well-nourished, in no significant distress and comfortable Head: Normocephalic and atraumatic  Eyes: Conjunctivae and EOM are normal. Pupils are equal, round, and reactive to light Right Ear: External ear normal without discharge Left Ear: External ear normal without discharge Nose: Nose without discharge or deformity Mouth/Throat: Oropharynx is without other ulcerations and moist  Neck: Normal range of motion. Neck supple. No JVD present. No tracheal deviation present or significant neck LA or mass Cardiovascular: Normal rate, regular rhythm, normal heart sounds and intact distal pulses.   Pulmonary/Chest: WOB normal and breath sounds without rales or wheezing  Abdominal: Soft. Bowel sounds are normal. NT. No HSM  Musculoskeletal: Normal range of motion. Exhibits no edema Lymphadenopathy: Has no other cervical adenopathy.  Neurological: Pt is alert and oriented to person, place, and time. Pt has normal reflexes. No cranial nerve deficit. Motor grossly intact, Gait intact Skin: Skin is warm and dry. + eczema rash to torso and arms notes, also groin erythem rash bilat without maceration Psychiatric:  Has nervous somewhat hypermanic mood and affect. Behavior is normal without agitation, 2+ nervous No other exam findings Lab Results  Component Value Date   WBC 7.1 11/09/2018   HGB 12.9 (L) 11/09/2018   HCT 38.6 (L) 11/09/2018   PLT 215.0 11/09/2018   GLUCOSE 145 (H) 11/09/2018   CHOL 244 (H) 11/09/2018   TRIG 394.0 (H) 11/09/2018   HDL 37.60 (L) 11/09/2018   LDLDIRECT 174.0 11/09/2018   LDLCALC 87 12/24/2013   ALT 17 11/09/2018   AST 15 11/09/2018   NA 140 11/09/2018   K 4.8 11/09/2018   CL 105 11/09/2018   CREATININE  1.61 (H) 11/09/2018   BUN 37 (H) 11/09/2018   CO2 26 11/09/2018   TSH 4.07 11/09/2018   PSA 1.00 11/09/2018   INR 0.99 06/08/2012   HGBA1C 6.9 (H) 11/09/2018   MICROALBUR 3.0 (H) 11/09/2018      Assessment & Plan:

## 2018-11-13 NOTE — Assessment & Plan Note (Signed)
Also fungal rash to groin areas - for ketocon cr prn,  to f/u any worsening symptoms or concerns

## 2018-11-13 NOTE — Assessment & Plan Note (Signed)
stable overall by history and exam, recent data reviewed with pt, and pt to continue medical treatment as before,  to f/u any worsening symptoms or concerns  

## 2018-11-13 NOTE — Patient Instructions (Signed)
Please take all new medication as prescribed - the antifungal cream as directed  Please continue all other medications as before, and refills have been done if requested - the eczema cream to restart  Please have the pharmacy call with any other refills you may need.  Please continue your efforts at being more active, low cholesterol diet, and weight control.  You are otherwise up to date with prevention measures today.  Please keep your appointments with your specialists as you may have planned  Please return in 6 months, or sooner if needed

## 2018-11-13 NOTE — Assessment & Plan Note (Signed)

## 2018-11-13 NOTE — Assessment & Plan Note (Addendum)
Ok for topicort asd,  to f/u any worsening symptoms or concerns  In addition to the time spent performing CPE, I spent an additional 25 minutes face to face,in which greater than 50% of this time was spent in counseling and coordination of care for patient's acute illness as documented, including the differential dx, treatment, further evaluation and other management of eczema, fungal rash, CKD, HTN, DM

## 2018-11-13 NOTE — Assessment & Plan Note (Signed)
Mild elevated today somewhat stressed, ok to cont same tx, continue to monitor

## 2018-11-16 ENCOUNTER — Other Ambulatory Visit: Payer: Self-pay | Admitting: Endocrinology

## 2018-11-16 NOTE — Telephone Encounter (Signed)
Please refill x 1 F/u is due  

## 2018-11-17 DIAGNOSIS — L304 Erythema intertrigo: Secondary | ICD-10-CM | POA: Diagnosis not present

## 2018-11-17 DIAGNOSIS — Z85828 Personal history of other malignant neoplasm of skin: Secondary | ICD-10-CM | POA: Diagnosis not present

## 2018-12-14 DIAGNOSIS — G4733 Obstructive sleep apnea (adult) (pediatric): Secondary | ICD-10-CM | POA: Diagnosis not present

## 2018-12-16 ENCOUNTER — Other Ambulatory Visit: Payer: Self-pay | Admitting: Endocrinology

## 2018-12-16 NOTE — Telephone Encounter (Signed)
LOV 05/13/18.Per your note, pt was to f/u in 4 months. No future appts found. Please advise how you would like to proceed.

## 2018-12-16 NOTE — Telephone Encounter (Signed)
Please refill x 1 F/u is due  

## 2019-01-20 DIAGNOSIS — L821 Other seborrheic keratosis: Secondary | ICD-10-CM | POA: Diagnosis not present

## 2019-01-20 DIAGNOSIS — Z85828 Personal history of other malignant neoplasm of skin: Secondary | ICD-10-CM | POA: Diagnosis not present

## 2019-01-20 DIAGNOSIS — L304 Erythema intertrigo: Secondary | ICD-10-CM | POA: Diagnosis not present

## 2019-01-20 DIAGNOSIS — L812 Freckles: Secondary | ICD-10-CM | POA: Diagnosis not present

## 2019-01-20 DIAGNOSIS — L570 Actinic keratosis: Secondary | ICD-10-CM | POA: Diagnosis not present

## 2019-02-01 ENCOUNTER — Telehealth: Payer: Self-pay | Admitting: Endocrinology

## 2019-02-03 ENCOUNTER — Other Ambulatory Visit: Payer: Self-pay

## 2019-02-03 DIAGNOSIS — E1122 Type 2 diabetes mellitus with diabetic chronic kidney disease: Secondary | ICD-10-CM

## 2019-02-03 MED ORDER — BROMOCRIPTINE MESYLATE 2.5 MG PO TABS: 1.2500 mg | ORAL_TABLET | Freq: Every day | ORAL | 2 refills | Status: DC

## 2019-02-03 NOTE — Telephone Encounter (Signed)
Patient has made a phone call visit for February 04, 2019 @ 9:30 a.m.

## 2019-02-03 NOTE — Telephone Encounter (Signed)
bromocriptine (PARLODEL) 2.5 MG tablet 15 tablet 2 02/03/2019    Sig - Route: Take 0.5 tablets (1.25 mg total) by mouth daily. - Oral   Sent to pharmacy as: bromocriptine (PARLODEL) 2.5 MG tablet   E-Prescribing Status: Receipt confirmed by pharmacy (02/03/2019 1:00 PM EDT)

## 2019-02-04 ENCOUNTER — Ambulatory Visit (INDEPENDENT_AMBULATORY_CARE_PROVIDER_SITE_OTHER): Payer: Medicare Other | Admitting: Endocrinology

## 2019-02-04 ENCOUNTER — Other Ambulatory Visit: Payer: Self-pay

## 2019-02-04 ENCOUNTER — Encounter: Payer: Self-pay | Admitting: Endocrinology

## 2019-02-04 DIAGNOSIS — E1122 Type 2 diabetes mellitus with diabetic chronic kidney disease: Secondary | ICD-10-CM | POA: Diagnosis not present

## 2019-02-04 DIAGNOSIS — N182 Chronic kidney disease, stage 2 (mild): Secondary | ICD-10-CM | POA: Diagnosis not present

## 2019-02-04 MED ORDER — BROMOCRIPTINE MESYLATE 2.5 MG PO TABS: 1.2500 mg | ORAL_TABLET | Freq: Every day | ORAL | 3 refills | Status: DC

## 2019-02-04 NOTE — Progress Notes (Signed)
Subjective:    Patient ID: Joseph Villa, male    DOB: 1941/01/29, 78 y.o.   MRN: [id number]  HPI telehealth visit today via phone x 6 minutes Alternatives to telehealth are presented to this patient, and the patient agrees to the telehealth visit. Pt is advised of the cost of the visit, and agrees to this, also.   Patient is at home, and I am at the office.   Persons attending the telehealth visit: the patient and I Pt returns for f/u of diabetes mellitus:  DM type: 2 Dx'ed: 2007.   Complications: polyneuropathy and renal insufficiency.   Therapy: 3 oral meds.  DKA: never.   Severe hypoglycemia: never.  Pancreatitis: never.  Other: he has never been on insulin, but he has learned how; he did not tolerate metformin-XR (diarrhea); he cannot take pioglitizone, due to edema; renal insufficiency also limits oral rx options. He did not tolerate repaglinide (constipation).   Interval history: He says cbg's are in the low to mid-100's.  pt states he feels well in general.  Pt says he takes meds as rx'ed Past Medical History:  Diagnosis Date  . ALLERGIC ASTHMA 11/25/2007  . ALLERGIC RHINITIS 11/25/2007  . Allergy   . Arthritis, lumbar spine 04/18/2011  . BIPOLAR DISORDER UNSPECIFIED 10/06/2009  . Blood transfusion without reported diagnosis    s/p goiter surgery age 12   . COLONIC POLYPS, HX OF 10/06/2009  . Degenerative arthritis of hip 04/18/2011  . DJD (degenerative joint disease)   . DM w/o Complication Type II 26/12/1243  . GERD 10/06/2009  . Hemorrhage yrs ago   after goiter surgery, tracheostomy inserted and later removed  . Hemorrhoid   . HYPERLIPIDEMIA 10/06/2009  . HYPERTENSION, BENIGN 07/27/2009  . HYPOTHYROIDISM 10/06/2009  . NEPHROLITHIASIS, HX OF 10/06/2009  . OBSTRUCTIVE SLEEP APNEA 11/25/2007   cpap setting of 9  . Peptic stricture of esophagus   . RBBB (right bundle branch block)   . Sleep apnea    wears c pap    Past Surgical History:  Procedure Laterality Date  .  COLONOSCOPY    . GANGLION CYST EXCISION     2-3 cysts removed  . thyroid Goiter surgery    . TONSILLECTOMY    . TOTAL HIP ARTHROPLASTY  06/16/2012   Procedure: TOTAL HIP ARTHROPLASTY ANTERIOR APPROACH;  Surgeon: Mauri Pole, MD;  Location: [facility];  Service: Orthopedics;  Laterality: Left;  . UMBILICAL HERNIA REPAIR  several yrs ago  . UPPER GASTROINTESTINAL ENDOSCOPY      Social History   Socioeconomic History  . Marital status: Married    Spouse name: Not on file  . Number of children: 3  . Years of education: Not on file  . Highest education level: Not on file  Occupational History  . Occupation: retired  Scientific laboratory technician  . Financial resource strain: Not on file  . Food insecurity    Worry: Not on file    Inability: Not on file  . Transportation needs    Medical: Not on file    Non-medical: Not on file  Tobacco Use  . Smoking status: Former Smoker    Packs/day: 1.50    Years: 15.00    Pack years: 22.50    Types: Cigarettes, Pipe    Quit date: 06/23/1977    Years since quitting: 41.6  . Smokeless tobacco: Never Used  Substance and Sexual Activity  . Alcohol use: No    Alcohol/week: 0.0 standard drinks  .  Drug use: No  . Sexual activity: Not on file  Lifestyle  . Physical activity    Days per week: Not on file    Minutes per session: Not on file  . Stress: Not on file  Relationships  . Social Herbalist on phone: Not on file    Gets together: Not on file    Attends religious service: Not on file    Active member of club or organization: Not on file    Attends meetings of clubs or organizations: Not on file    Relationship status: Not on file  . Intimate partner violence    Fear of current or ex partner: Not on file    Emotionally abused: Not on file    Physically abused: Not on file    Forced sexual activity: Not on file  Other Topics Concern  . Not on file  Social History Narrative  . Not on file    Current Outpatient Medications on File  Prior to Visit  Medication Sig Dispense Refill  . ACCU-CHEK AVIVA PLUS test strip CHECK BLOOD GLUCOSE (SUGAR) TWICE DAILY 200 each 1  . ACCU-CHEK FASTCLIX LANCETS MISC USE TWICE DAILY 102 each 5  . ADVAIR DISKUS 250-50 MCG/DOSE AEPB INHALE 1 PUFF AND RINSE MOUTH TWICE DAILY. MAINTENANCE INHALER 180 each 1  . aspirin 81 MG chewable tablet Chew 81 mg by mouth daily.    . Blood Glucose Monitoring Suppl (ACCU-CHEK AVIVA PLUS) W/DEVICE KIT AS DIRECTED 1 kit 0  . carbamazepine (TEGRETOL) 200 MG tablet Take 2 tablets (400 mg total) by mouth at bedtime. 180 tablet 3  . Cholecalciferol (VITAMIN D3) 2000 UNITS TABS Take 2,000 Units by mouth every morning.     . clobetasol cream (TEMOVATE) 5.09 % Apply 1 application topically 2 (two) times daily as needed. For eczema. 30 g 2  . Coenzyme Q10 (CO Q 10) 100 MG CAPS Take 200 mg by mouth daily.    Marland Kitchen desonide (DESOWEN) 0.05 % cream Apply 1 application topically 2 (two) times daily.    Marland Kitchen desoximetasone (TOPICORT) 0.25 % cream Apply 1 application topically 2 (two) times daily. 100 g 0  . doxycycline (ADOXA) 150 MG tablet Take 150 mg by mouth 2 (two) times daily.    . fenofibrate (TRICOR) 145 MG tablet Take 1 tablet (145 mg total) by mouth daily. 90 tablet 3  . fluticasone (FLONASE) 50 MCG/ACT nasal spray ONE SPRAY INTO NOSE EVERY DAY 48 g 1  . glucosamine-chondroitin 500-400 MG tablet Take 1 tablet by mouth every morning.     Marland Kitchen glucose blood (ACCU-CHEK AVIVA PLUS) test strip Used to check blood sugars twice a day Dx E11.9 200 each 0  . glucose blood test strip Use to check blood sugar 2 times per day. DX Code: E11.9 200 each 2  . hydrochlorothiazide (HYDRODIURIL) 25 MG tablet TAKE ONE TABLET EACH DAY 90 tablet 3  . hydrocortisone 2.5 % lotion As directed    . ketoconazole (NIZORAL) 2 % cream Apply 1 application topically daily. 60 g 1  . Lancets Misc. (ACCU-CHEK FASTCLIX LANCET) KIT AS DIRECTED 1 kit 0  . levothyroxine (SYNTHROID, LEVOTHROID) 112 MCG tablet TAKE  ONE TABLET DAILY 90 tablet 1  . Multiple Vitamins-Minerals (ICAPS AREDS FORMULA PO) Take 1 capsule by mouth daily.    . Na Sulfate-K Sulfate-Mg Sulf SOLN Take 1 kit by mouth once. 354 mL 0  . nateglinide (STARLIX) 60 MG tablet TAKE ONE-HALF TABLET 3 TIMES DAILY  WITH MEALS 45 tablet 11  . olmesartan (BENICAR) 40 MG tablet TAKE ONE TABLET EACH DAY 90 tablet 3  . Omega-3 Fatty Acids (FISH OIL) 1200 MG CAPS Take 1 capsule by mouth daily.    Marland Kitchen OVER THE COUNTER MEDICATION Take 1 tablet by mouth daily. Tumeric    . pantoprazole (PROTONIX) 40 MG tablet TAKE ONE TABLET EACH DAY 90 tablet 0  . pantoprazole (PROTONIX) 40 MG tablet TAKE ONE TABLET EACH DAY 90 tablet 3  . PROAIR HFA 108 (90 Base) MCG/ACT inhaler 2 PUFFS FOUR TIMES DAILY AS NEEDED 8.5 g 0  . PROCTOZONE-HC 2.5 % rectal cream APPLY TWICE DAILY RECTALLY 30 g 0  . sitaGLIPtin (JANUVIA) 100 MG tablet Take 0.5 tablets (50 mg total) by mouth daily. 45 tablet 3  . sulfamethoxazole-trimethoprim (BACTRIM,SEPTRA) 400-80 MG tablet Take 1 tablet by mouth 2 (two) times daily.    . tamsulosin (FLOMAX) 0.4 MG CAPS capsule TAKE ONE CAPSULE EACH DAY 90 capsule 3  . traMADol (ULTRAM) 50 MG tablet TAKE ONE TABLET EVERY EIGHT HOURS AS NEEDED 90 tablet 2   No current facility-administered medications on file prior to visit.     Allergies  Allergen Reactions  . Cefuroxime Axetil Anaphylaxis    REACTION: anaphylaxis-ceftin   . Cefprozil     REACTION: unknown  . Cephalosporins Nausea Only  . Crestor [Rosuvastatin Calcium]   . Lipitor [Atorvastatin]     myalgiaas  . Metformin And Related   . Rabeprazole Sodium Nausea Only    REACTION: nausea  . Statins     Family History  Problem Relation Age of Onset  . Throat cancer Mother   . Alcohol abuse Brother   . Diabetes Brother   . Drug abuse Daughter   . Alcoholism Unknown        uncle  . Breast cancer Father   . Colon cancer Neg Hx   . Colon polyps Neg Hx   . Esophageal cancer Neg Hx   . Rectal  cancer Neg Hx   . Stomach cancer Neg Hx     Ht 5' 10.5" (1.791 m)   BMI 35.93 kg/m    Review of Systems He denies hypoglycemia.      Objective:   Physical Exam   Lab Results  Component Value Date   HGBA1C 6.9 (H) 11/09/2018   Lab Results  Component Value Date   CREATININE 1.61 (H) 11/09/2018   BUN 37 (H) 11/09/2018   NA 140 11/09/2018   K 4.8 11/09/2018   CL 105 11/09/2018   CO2 26 11/09/2018       Assessment & Plan:  Type 2 DM, with renal insuff: apparently well-controlled.  Patient Instructions  Please continue the same medications.  check your blood sugar once a day.  vary the time of day when you check, between before the 3 meals, and at bedtime.  also check if you have symptoms of your blood sugar being too high or too low.  please keep a record of the readings and bring it to your next appointment here (or you can bring the meter itself).  You can write it on any piece of paper.  please call us sooner if your blood sugar goes below 70, or if you have a lot of readings over 200. Please come back for a follow-up appointment in 3 months.

## 2019-02-04 NOTE — Patient Instructions (Addendum)
Please continue the same medications.    check your blood sugar once a day.  vary the time of day when you check, between before the 3 meals, and at bedtime.  also check if you have symptoms of your blood sugar being too high or too low.  please keep a record of the readings and bring it to your next appointment here (or you can bring the meter itself).  You can write it on any piece of paper.  please call us sooner if your blood sugar goes below 70, or if you have a lot of readings over 200.  Please come back for a follow-up appointment in 3 months.    

## 2019-02-19 ENCOUNTER — Ambulatory Visit (INDEPENDENT_AMBULATORY_CARE_PROVIDER_SITE_OTHER): Payer: Medicare Other

## 2019-02-19 ENCOUNTER — Other Ambulatory Visit: Payer: Self-pay

## 2019-02-19 DIAGNOSIS — Z23 Encounter for immunization: Secondary | ICD-10-CM | POA: Diagnosis not present

## 2019-03-04 ENCOUNTER — Other Ambulatory Visit: Payer: Self-pay | Admitting: Internal Medicine

## 2019-03-13 ENCOUNTER — Other Ambulatory Visit: Payer: Self-pay | Admitting: Endocrinology

## 2019-04-20 DIAGNOSIS — G4733 Obstructive sleep apnea (adult) (pediatric): Secondary | ICD-10-CM | POA: Diagnosis not present

## 2019-05-14 ENCOUNTER — Other Ambulatory Visit: Payer: Self-pay | Admitting: Internal Medicine

## 2019-05-17 ENCOUNTER — Telehealth: Payer: Self-pay

## 2019-05-17 ENCOUNTER — Other Ambulatory Visit (INDEPENDENT_AMBULATORY_CARE_PROVIDER_SITE_OTHER): Payer: Medicare Other

## 2019-05-17 DIAGNOSIS — Z0000 Encounter for general adult medical examination without abnormal findings: Secondary | ICD-10-CM

## 2019-05-17 DIAGNOSIS — N182 Chronic kidney disease, stage 2 (mild): Secondary | ICD-10-CM | POA: Diagnosis not present

## 2019-05-17 DIAGNOSIS — Z125 Encounter for screening for malignant neoplasm of prostate: Secondary | ICD-10-CM

## 2019-05-17 DIAGNOSIS — E1122 Type 2 diabetes mellitus with diabetic chronic kidney disease: Secondary | ICD-10-CM

## 2019-05-17 LAB — LDL CHOLESTEROL, DIRECT: Direct LDL: 172 mg/dL

## 2019-05-17 LAB — URINALYSIS, ROUTINE W REFLEX MICROSCOPIC
Bilirubin Urine: NEGATIVE
Hgb urine dipstick: NEGATIVE
Ketones, ur: NEGATIVE
Leukocytes,Ua: NEGATIVE
Nitrite: NEGATIVE
Specific Gravity, Urine: 1.02 (ref 1.000–1.030)
Total Protein, Urine: NEGATIVE
Urine Glucose: NEGATIVE
Urobilinogen, UA: 0.2 (ref 0.0–1.0)
pH: 5.5 (ref 5.0–8.0)

## 2019-05-17 LAB — HEPATIC FUNCTION PANEL
ALT: 15 U/L (ref 0–53)
AST: 15 U/L (ref 0–37)
Albumin: 4.2 g/dL (ref 3.5–5.2)
Alkaline Phosphatase: 69 U/L (ref 39–117)
Bilirubin, Direct: 0.1 mg/dL (ref 0.0–0.3)
Total Bilirubin: 0.3 mg/dL (ref 0.2–1.2)
Total Protein: 6.9 g/dL (ref 6.0–8.3)

## 2019-05-17 LAB — CBC WITH DIFFERENTIAL/PLATELET
Basophils Absolute: 0 10*3/uL (ref 0.0–0.1)
Basophils Relative: 0.7 % (ref 0.0–3.0)
Eosinophils Absolute: 0.4 10*3/uL (ref 0.0–0.7)
Eosinophils Relative: 5.3 % — ABNORMAL HIGH (ref 0.0–5.0)
HCT: 37.2 % — ABNORMAL LOW (ref 39.0–52.0)
Hemoglobin: 12.4 g/dL — ABNORMAL LOW (ref 13.0–17.0)
Lymphocytes Relative: 30 % (ref 12.0–46.0)
Lymphs Abs: 2 10*3/uL (ref 0.7–4.0)
MCHC: 33.4 g/dL (ref 30.0–36.0)
MCV: 90.9 fl (ref 78.0–100.0)
Monocytes Absolute: 0.7 10*3/uL (ref 0.1–1.0)
Monocytes Relative: 10.9 % (ref 3.0–12.0)
Neutro Abs: 3.5 10*3/uL (ref 1.4–7.7)
Neutrophils Relative %: 53.1 % (ref 43.0–77.0)
Platelets: 196 10*3/uL (ref 150.0–400.0)
RBC: 4.09 Mil/uL — ABNORMAL LOW (ref 4.22–5.81)
RDW: 13.4 % (ref 11.5–15.5)
WBC: 6.7 10*3/uL (ref 4.0–10.5)

## 2019-05-17 LAB — PSA: PSA: 0.81 ng/mL (ref 0.10–4.00)

## 2019-05-17 LAB — BASIC METABOLIC PANEL
BUN: 50 mg/dL — ABNORMAL HIGH (ref 6–23)
CO2: 24 mEq/L (ref 19–32)
Calcium: 9.5 mg/dL (ref 8.4–10.5)
Chloride: 105 mEq/L (ref 96–112)
Creatinine, Ser: 1.79 mg/dL — ABNORMAL HIGH (ref 0.40–1.50)
GFR: 37.1 mL/min — ABNORMAL LOW (ref >60.00)
Glucose, Bld: 128 mg/dL — ABNORMAL HIGH (ref 70–99)
Potassium: 4.7 mEq/L (ref 3.5–5.1)
Sodium: 139 mEq/L (ref 135–145)

## 2019-05-17 LAB — HEMOGLOBIN A1C: Hgb A1c MFr Bld: 6.2 % (ref 4.6–6.5)

## 2019-05-17 LAB — LIPID PANEL
Cholesterol: 254 mg/dL — ABNORMAL HIGH (ref 0–200)
HDL: 35.8 mg/dL — ABNORMAL LOW (ref >39.00)
Total CHOL/HDL Ratio: 7
Triglycerides: 417 mg/dL — ABNORMAL HIGH (ref 0.0–149.0)

## 2019-05-17 LAB — TSH: TSH: 4.47 u[IU]/mL (ref 0.35–4.50)

## 2019-05-17 NOTE — Telephone Encounter (Signed)
Orders for upcoming OV entered

## 2019-05-21 ENCOUNTER — Encounter: Payer: Self-pay | Admitting: Internal Medicine

## 2019-05-21 ENCOUNTER — Other Ambulatory Visit: Payer: Self-pay

## 2019-05-21 ENCOUNTER — Ambulatory Visit (INDEPENDENT_AMBULATORY_CARE_PROVIDER_SITE_OTHER): Payer: Medicare Other | Admitting: Internal Medicine

## 2019-05-21 VITALS — BP 134/82 | HR 74 | Ht 70.5 in | Wt 250.0 lb

## 2019-05-21 DIAGNOSIS — I10 Essential (primary) hypertension: Secondary | ICD-10-CM | POA: Diagnosis not present

## 2019-05-21 DIAGNOSIS — N183 Chronic kidney disease, stage 3 unspecified: Secondary | ICD-10-CM

## 2019-05-21 DIAGNOSIS — E611 Iron deficiency: Secondary | ICD-10-CM

## 2019-05-21 DIAGNOSIS — Z0000 Encounter for general adult medical examination without abnormal findings: Secondary | ICD-10-CM

## 2019-05-21 DIAGNOSIS — E039 Hypothyroidism, unspecified: Secondary | ICD-10-CM | POA: Diagnosis not present

## 2019-05-21 DIAGNOSIS — E1122 Type 2 diabetes mellitus with diabetic chronic kidney disease: Secondary | ICD-10-CM

## 2019-05-21 DIAGNOSIS — E559 Vitamin D deficiency, unspecified: Secondary | ICD-10-CM

## 2019-05-21 DIAGNOSIS — E785 Hyperlipidemia, unspecified: Secondary | ICD-10-CM | POA: Diagnosis not present

## 2019-05-21 DIAGNOSIS — N182 Chronic kidney disease, stage 2 (mild): Secondary | ICD-10-CM

## 2019-05-21 DIAGNOSIS — E538 Deficiency of other specified B group vitamins: Secondary | ICD-10-CM

## 2019-05-21 NOTE — Patient Instructions (Signed)
Please continue all other medications as before, and refills have been done if requested.  Please have the pharmacy call with any other refills you may need.  Please continue your efforts at being more active, low cholesterol diet, and weight control.  You are otherwise up to date with prevention measures today.  Please keep your appointments with your specialists as you may have planned  Please return in 6 months, or sooner if needed, with Lab testing done 3-5 days before  

## 2019-05-21 NOTE — Assessment & Plan Note (Signed)
Statin intolerant, goal ldl < 100, for lower chol diet, o/w stable overall by history and exam, recent data reviewed with pt, and pt to continue medical treatment as before,  to f/u any worsening symptoms or concerns

## 2019-05-21 NOTE — Progress Notes (Signed)
Subjective:    Patient ID: Joseph Villa, male    DOB: November 25, 1940, 78 y.o.   MRN: [id number]  HPI  Here to f/u; overall doing ok,  Pt denies chest pain, increasing sob or doe, wheezing, orthopnea, PND, increased LE swelling, palpitations, dizziness or syncope.  Pt denies new neurological symptoms such as new headache, or facial or extremity weakness or numbness.  Pt denies polydipsia, polyuria, or low sugar episode.  Pt states overall good compliance with meds, mostly trying to follow appropriate diet, with wt overall stable,  but little exercise however.  Lost wt with better diet, despite the pandemic. Wt Readings from Last 3 Encounters:  05/21/19 250 lb (113.4 kg)  11/13/18 254 lb (115.2 kg)  05/13/18 259 lb 3.2 oz (117.6 kg)  Has been strictly at home since mar 2020 without visitors or even shopping (duaghter does all shopping).  Still has motorcycle and has not ridden lately but misses it.   BP Readings from Last 3 Encounters:  05/21/19 134/82  11/13/18 (!) 144/82  05/13/18 (!) 122/58  Sees renal, not recently however. BP at home < 140/90.  Has been statin intolerant. Denies hyper or hypo thyroid symptoms such as voice, skin or hair change. Past Medical History:  Diagnosis Date  . ALLERGIC ASTHMA 11/25/2007  . ALLERGIC RHINITIS 11/25/2007  . Allergy   . Arthritis, lumbar spine 04/18/2011  . BIPOLAR DISORDER UNSPECIFIED 10/06/2009  . Blood transfusion without reported diagnosis    s/p goiter surgery age 5   . COLONIC POLYPS, HX OF 10/06/2009  . Degenerative arthritis of hip 04/18/2011  . DJD (degenerative joint disease)   . DM w/o Complication Type II 40/0/8676  . GERD 10/06/2009  . Hemorrhage yrs ago   after goiter surgery, tracheostomy inserted and later removed  . Hemorrhoid   . HYPERLIPIDEMIA 10/06/2009  . HYPERTENSION, BENIGN 07/27/2009  . HYPOTHYROIDISM 10/06/2009  . NEPHROLITHIASIS, HX OF 10/06/2009  . OBSTRUCTIVE SLEEP APNEA 11/25/2007   cpap setting of 9  . Peptic stricture of  esophagus   . RBBB (right bundle branch block)   . Sleep apnea    wears c pap   Past Surgical History:  Procedure Laterality Date  . COLONOSCOPY    . GANGLION CYST EXCISION     2-3 cysts removed  . thyroid Goiter surgery    . TONSILLECTOMY    . TOTAL HIP ARTHROPLASTY  06/16/2012   Procedure: TOTAL HIP ARTHROPLASTY ANTERIOR APPROACH;  Surgeon: Mauri Pole, MD;  Location: [facility];  Service: Orthopedics;  Laterality: Left;  . UMBILICAL HERNIA REPAIR  several yrs ago  . UPPER GASTROINTESTINAL ENDOSCOPY      reports that he quit smoking about 41 years ago. His smoking use included cigarettes and pipe. He has a 22.50 pack-year smoking history. He has never used smokeless tobacco. He reports that he does not drink alcohol or use drugs. family history includes Alcohol abuse in his brother; Alcoholism in his unknown relative; Breast cancer in his father; Diabetes in his brother; Drug abuse in his daughter; Throat cancer in his mother. Allergies  Allergen Reactions  . Cefuroxime Axetil Anaphylaxis    REACTION: anaphylaxis-ceftin   . Cefprozil     REACTION: unknown  . Cephalosporins Nausea Only  . Crestor [Rosuvastatin Calcium]   . Lipitor [Atorvastatin]     myalgiaas  . Metformin And Related   . Rabeprazole Sodium Nausea Only    REACTION: nausea  . Statins    Current Outpatient Medications on  File Prior to Visit  Medication Sig Dispense Refill  . ACCU-CHEK AVIVA PLUS test strip CHECK BLOOD GLUCOSE (SUGAR) TWICE DAILY 200 each 1  . Accu-Chek FastClix Lancets MISC USE TWICE DAILY 102 each 5  . ADVAIR DISKUS 250-50 MCG/DOSE AEPB INHALE 1 PUFF AND RINSE MOUTH TWICE DAILY. MAINTENANCE INHALER 180 each 1  . aspirin 81 MG chewable tablet Chew 81 mg by mouth daily.    . Blood Glucose Monitoring Suppl (ACCU-CHEK AVIVA PLUS) W/DEVICE KIT AS DIRECTED 1 kit 0  . bromocriptine (PARLODEL) 2.5 MG tablet Take 0.5 tablets (1.25 mg total) by mouth daily. 45 tablet 3  . carbamazepine (TEGRETOL) 200 MG  tablet TAKE 2 TABLETS AT BEDTIME 180 tablet 0  . Cholecalciferol (VITAMIN D3) 2000 UNITS TABS Take 2,000 Units by mouth every morning.     . clobetasol cream (TEMOVATE) 0.93 % Apply 1 application topically 2 (two) times daily as needed. For eczema. 30 g 2  . Coenzyme Q10 (CO Q 10) 100 MG CAPS Take 200 mg by mouth daily.    Marland Kitchen desonide (DESOWEN) 0.05 % cream Apply 1 application topically 2 (two) times daily.    Marland Kitchen desoximetasone (TOPICORT) 0.25 % cream Apply 1 application topically 2 (two) times daily. 100 g 0  . doxycycline (ADOXA) 150 MG tablet Take 150 mg by mouth 2 (two) times daily.    . fenofibrate (TRICOR) 145 MG tablet Take 1 tablet (145 mg total) by mouth daily. 90 tablet 3  . fluticasone (FLONASE) 50 MCG/ACT nasal spray ONE SPRAY INTO NOSE EVERY DAY 48 g 1  . glucosamine-chondroitin 500-400 MG tablet Take 1 tablet by mouth every morning.     Marland Kitchen glucose blood (ACCU-CHEK AVIVA PLUS) test strip Used to check blood sugars twice a day Dx E11.9 200 each 0  . glucose blood test strip Use to check blood sugar 2 times per day. DX Code: E11.9 200 each 2  . hydrochlorothiazide (HYDRODIURIL) 25 MG tablet TAKE ONE TABLET EACH DAY 90 tablet 3  . hydrocortisone 2.5 % lotion As directed    . ketoconazole (NIZORAL) 2 % cream Apply 1 application topically daily. 60 g 1  . Lancets Misc. (ACCU-CHEK FASTCLIX LANCET) KIT AS DIRECTED 1 kit 0  . levothyroxine (SYNTHROID) 112 MCG tablet TAKE ONE TABLET DAILY 90 tablet 1  . Multiple Vitamins-Minerals (ICAPS AREDS FORMULA PO) Take 1 capsule by mouth daily.    . Na Sulfate-K Sulfate-Mg Sulf SOLN Take 1 kit by mouth once. 354 mL 0  . nateglinide (STARLIX) 60 MG tablet TAKE ONE-HALF TABLET 3 TIMES DAILY WITH MEALS 45 tablet 11  . olmesartan (BENICAR) 40 MG tablet TAKE ONE TABLET EACH DAY 90 tablet 3  . Omega-3 Fatty Acids (FISH OIL) 1200 MG CAPS Take 1 capsule by mouth daily.    Marland Kitchen OVER THE COUNTER MEDICATION Take 1 tablet by mouth daily. Tumeric    . pantoprazole  (PROTONIX) 40 MG tablet TAKE ONE TABLET EACH DAY 90 tablet 0  . pantoprazole (PROTONIX) 40 MG tablet TAKE ONE TABLET EACH DAY 90 tablet 3  . PROAIR HFA 108 (90 Base) MCG/ACT inhaler 2 PUFFS FOUR TIMES DAILY AS NEEDED 8.5 g 0  . PROCTOZONE-HC 2.5 % rectal cream APPLY TWICE DAILY RECTALLY 30 g 0  . sitaGLIPtin (JANUVIA) 100 MG tablet Take 0.5 tablets (50 mg total) by mouth daily. 45 tablet 3  . sulfamethoxazole-trimethoprim (BACTRIM,SEPTRA) 400-80 MG tablet Take 1 tablet by mouth 2 (two) times daily.    . tamsulosin (FLOMAX) 0.4  MG CAPS capsule TAKE ONE CAPSULE EACH DAY 90 capsule 3  . traMADol (ULTRAM) 50 MG tablet TAKE ONE TABLET EVERY EIGHT HOURS AS NEEDED 90 tablet 2   No current facility-administered medications on file prior to visit.   Review of Systems  Constitutional: Negative for other unusual diaphoresis or sweats HENT: Negative for ear discharge or swelling Eyes: Negative for other worsening visual disturbances Respiratory: Negative for stridor or other swelling  Gastrointestinal: Negative for worsening distension or other blood Genitourinary: Negative for retention or other urinary change Musculoskeletal: Negative for other MSK pain or swelling Skin: Negative for color change or other new lesions Neurological: Negative for worsening tremors and other numbness  Psychiatric/Behavioral: Negative for worsening agitation or other fatigue All otherwise neg per pt     Objective:   Physical Exam BP 134/82   Pulse 74   Ht 5' 10.5" (1.791 m)   Wt 250 lb (113.4 kg)   SpO2 96%   BMI 35.36 kg/m  VS noted,  Constitutional: Pt appears in NAD HENT: Head: NCAT.  Right Ear: External ear normal.  Left Ear: External ear normal.  Eyes: . Pupils are equal, round, and reactive to light. Conjunctivae and EOM are normal Nose: without d/c or deformity Neck: Neck supple. Gross normal ROM Cardiovascular: Normal rate and regular rhythm.   Pulmonary/Chest: Effort normal and breath sounds  without rales or wheezing.  Abd:  Soft, NT, ND, + BS, no organomegaly Neurological: Pt is alert. At baseline orientation, motor grossly intact Skin: Skin is warm. No rashes, other new lesions, no LE edema Psychiatric: Pt behavior is normal without agitation  All otherwise neg per pt Lab Results  Component Value Date   WBC 6.7 05/17/2019   HGB 12.4 (L) 05/17/2019   HCT 37.2 (L) 05/17/2019   PLT 196.0 05/17/2019   GLUCOSE 128 (H) 05/17/2019   CHOL 254 (H) 05/17/2019   TRIG (H) 05/17/2019    417.0 Triglyceride is over 400; calculations on Lipids are invalid.   HDL 35.80 (L) 05/17/2019   LDLDIRECT 172.0 05/17/2019   LDLCALC 87 12/24/2013   ALT 15 05/17/2019   AST 15 05/17/2019   NA 139 05/17/2019   K 4.7 05/17/2019   CL 105 05/17/2019   CREATININE 1.79 (H) 05/17/2019   BUN 50 (H) 05/17/2019   CO2 24 05/17/2019   TSH 4.47 05/17/2019   PSA 0.81 05/17/2019   INR 0.99 06/08/2012   HGBA1C 6.2 05/17/2019   MICROALBUR 3.0 (H) 11/09/2018      Assessment & Plan:

## 2019-05-22 ENCOUNTER — Encounter: Payer: Self-pay | Admitting: Internal Medicine

## 2019-05-22 NOTE — Assessment & Plan Note (Signed)
stable overall by history and exam, recent data reviewed with pt, and pt to continue medical treatment as before,  to f/u any worsening symptoms or concerns  

## 2019-06-11 ENCOUNTER — Other Ambulatory Visit: Payer: Self-pay | Admitting: Internal Medicine

## 2019-06-18 ENCOUNTER — Other Ambulatory Visit: Payer: Self-pay | Admitting: Internal Medicine

## 2019-06-18 NOTE — Telephone Encounter (Signed)
Please refill as per office routine med refill policy (all routine meds refilled for 3 mo or monthly per pt preference up to one year from last visit, then month to month grace period for 3 mo, then further med refills will have to be denied)  

## 2019-07-05 ENCOUNTER — Other Ambulatory Visit: Payer: Self-pay | Admitting: Internal Medicine

## 2019-07-05 NOTE — Telephone Encounter (Signed)
Please refill as per office routine med refill policy (all routine meds refilled for 3 mo or monthly per pt preference up to one year from last visit, then month to month grace period for 3 mo, then further med refills will have to be denied)  

## 2019-08-02 ENCOUNTER — Other Ambulatory Visit: Payer: Self-pay | Admitting: Internal Medicine

## 2019-08-20 ENCOUNTER — Other Ambulatory Visit: Payer: Self-pay | Admitting: Internal Medicine

## 2019-09-08 ENCOUNTER — Other Ambulatory Visit: Payer: Self-pay | Admitting: Internal Medicine

## 2019-09-08 NOTE — Telephone Encounter (Signed)
Please refill as per office routine med refill policy (all routine meds refilled for 3 mo or monthly per pt preference up to one year from last visit, then month to month grace period for 3 mo, then further med refills will have to be denied)  

## 2019-09-10 DIAGNOSIS — N183 Chronic kidney disease, stage 3 unspecified: Secondary | ICD-10-CM | POA: Diagnosis not present

## 2019-09-10 DIAGNOSIS — G4733 Obstructive sleep apnea (adult) (pediatric): Secondary | ICD-10-CM | POA: Diagnosis not present

## 2019-09-10 DIAGNOSIS — I129 Hypertensive chronic kidney disease with stage 1 through stage 4 chronic kidney disease, or unspecified chronic kidney disease: Secondary | ICD-10-CM | POA: Diagnosis not present

## 2019-09-10 DIAGNOSIS — Z87442 Personal history of urinary calculi: Secondary | ICD-10-CM | POA: Diagnosis not present

## 2019-09-10 DIAGNOSIS — E1122 Type 2 diabetes mellitus with diabetic chronic kidney disease: Secondary | ICD-10-CM | POA: Diagnosis not present

## 2019-09-23 ENCOUNTER — Other Ambulatory Visit: Payer: Self-pay | Admitting: Internal Medicine

## 2019-09-24 DIAGNOSIS — H353132 Nonexudative age-related macular degeneration, bilateral, intermediate dry stage: Secondary | ICD-10-CM | POA: Diagnosis not present

## 2019-09-24 DIAGNOSIS — H524 Presbyopia: Secondary | ICD-10-CM | POA: Diagnosis not present

## 2019-09-24 DIAGNOSIS — H2513 Age-related nuclear cataract, bilateral: Secondary | ICD-10-CM | POA: Diagnosis not present

## 2019-09-24 DIAGNOSIS — E119 Type 2 diabetes mellitus without complications: Secondary | ICD-10-CM | POA: Diagnosis not present

## 2019-09-24 LAB — HM DIABETES EYE EXAM

## 2019-09-28 ENCOUNTER — Encounter: Payer: Self-pay | Admitting: Internal Medicine

## 2019-10-06 ENCOUNTER — Other Ambulatory Visit: Payer: Self-pay | Admitting: Internal Medicine

## 2019-10-06 NOTE — Telephone Encounter (Signed)
Please refill as per office routine med refill policy (all routine meds refilled for 3 mo or monthly per pt preference up to one year from last visit, then month to month grace period for 3 mo, then further med refills will have to be denied)  

## 2019-10-11 DIAGNOSIS — L570 Actinic keratosis: Secondary | ICD-10-CM | POA: Diagnosis not present

## 2019-10-11 DIAGNOSIS — Z85828 Personal history of other malignant neoplasm of skin: Secondary | ICD-10-CM | POA: Diagnosis not present

## 2019-10-11 DIAGNOSIS — L218 Other seborrheic dermatitis: Secondary | ICD-10-CM | POA: Diagnosis not present

## 2019-10-11 DIAGNOSIS — L821 Other seborrheic keratosis: Secondary | ICD-10-CM | POA: Diagnosis not present

## 2019-10-11 DIAGNOSIS — L820 Inflamed seborrheic keratosis: Secondary | ICD-10-CM | POA: Diagnosis not present

## 2019-10-20 DIAGNOSIS — G4733 Obstructive sleep apnea (adult) (pediatric): Secondary | ICD-10-CM | POA: Diagnosis not present

## 2019-10-25 ENCOUNTER — Ambulatory Visit: Payer: Medicare Other | Admitting: Podiatry

## 2019-10-25 ENCOUNTER — Ambulatory Visit (INDEPENDENT_AMBULATORY_CARE_PROVIDER_SITE_OTHER): Payer: Medicare Other

## 2019-10-25 ENCOUNTER — Other Ambulatory Visit: Payer: Self-pay

## 2019-10-25 DIAGNOSIS — M2041 Other hammer toe(s) (acquired), right foot: Secondary | ICD-10-CM

## 2019-10-25 DIAGNOSIS — S9002XA Contusion of left ankle, initial encounter: Secondary | ICD-10-CM

## 2019-10-25 DIAGNOSIS — M25572 Pain in left ankle and joints of left foot: Secondary | ICD-10-CM

## 2019-10-25 DIAGNOSIS — T148XXA Other injury of unspecified body region, initial encounter: Secondary | ICD-10-CM

## 2019-10-25 DIAGNOSIS — M79672 Pain in left foot: Secondary | ICD-10-CM

## 2019-10-25 DIAGNOSIS — M2042 Other hammer toe(s) (acquired), left foot: Secondary | ICD-10-CM | POA: Diagnosis not present

## 2019-10-26 ENCOUNTER — Encounter: Payer: Self-pay | Admitting: Podiatry

## 2019-10-26 NOTE — Progress Notes (Signed)
Subjective:  Patient ID: Joseph Villa, male    DOB: June 24, 1940,  MRN: [id number]  Chief Complaint  Patient presents with  . Foot Pain    pt is here for left dorsal foot pain, which has been going on for about a week, pt states it could be due to him [place] "flip flops" at the beach. Pt has tried diabetic shoes but not much has helped    79 y.o. male presents with the above complaint.  Patient presents with complaint of left foot/ankle medial side pain.  Patient states been going for about a week.  Patient states he was wearing flip-flops at the beach which caused him to have different position of the foot and caused irritation.  Pain scale 9 out of 10.  Patient has tried diabetic shoes which helped tremendously.  Patient is looking for prescription for another diabetic shoes as well.  He got diabetic shoes last time or greater than a year ago and would like to get another pair of diabetic shoes.  He states that he has not had this kind of pain to the medial ankle area ever.  He denies any other acute complaints.  He denies seeing anyone else prior to see me for this.  His pain is dull achy in nature.   Review of Systems: Negative except as noted in the HPI. Denies N/V/F/Ch.  Past Medical History:  Diagnosis Date  . ALLERGIC ASTHMA 11/25/2007  . ALLERGIC RHINITIS 11/25/2007  . Allergy   . Arthritis, lumbar spine 04/18/2011  . BIPOLAR DISORDER UNSPECIFIED 10/06/2009  . Blood transfusion without reported diagnosis    s/p goiter surgery age 36   . COLONIC POLYPS, HX OF 10/06/2009  . Degenerative arthritis of hip 04/18/2011  . DJD (degenerative joint disease)   . DM w/o Complication Type II 84/10/3644  . GERD 10/06/2009  . Hemorrhage yrs ago   after goiter surgery, tracheostomy inserted and later removed  . Hemorrhoid   . HYPERLIPIDEMIA 10/06/2009  . HYPERTENSION, BENIGN 07/27/2009  . HYPOTHYROIDISM 10/06/2009  . NEPHROLITHIASIS, HX OF 10/06/2009  . OBSTRUCTIVE SLEEP APNEA 11/25/2007   cpap  setting of 9  . Peptic stricture of esophagus   . RBBB (right bundle branch block)   . Sleep apnea    wears c pap    Current Outpatient Medications:  .  ACCU-CHEK AVIVA PLUS test strip, CHECK BLOOD GLUCOSE (SUGAR) TWICE DAILY, Disp: 200 each, Rfl: 1 .  Accu-Chek FastClix Lancets MISC, USE TWICE DAILY, Disp: 102 each, Rfl: 5 .  ADVAIR DISKUS 250-50 MCG/DOSE AEPB, INHALE 1 PUFF AND RINSE MOUTH TWICE DAILY. MAINTENANCE INHALER, Disp: 180 each, Rfl: 1 .  aspirin 81 MG chewable tablet, Chew 81 mg by mouth daily., Disp: , Rfl:  .  Blood Glucose Monitoring Suppl (ACCU-CHEK AVIVA PLUS) W/DEVICE KIT, AS DIRECTED, Disp: 1 kit, Rfl: 0 .  bromocriptine (PARLODEL) 2.5 MG tablet, Take 0.5 tablets (1.25 mg total) by mouth daily., Disp: 45 tablet, Rfl: 3 .  carbamazepine (TEGRETOL) 200 MG tablet, TAKE 2 TABLETS AT BEDTIME, Disp: 180 tablet, Rfl: 1 .  Cholecalciferol (VITAMIN D3) 2000 UNITS TABS, Take 2,000 Units by mouth every morning. , Disp: , Rfl:  .  clobetasol cream (TEMOVATE) 8.03 %, Apply 1 application topically 2 (two) times daily as needed. For eczema., Disp: 30 g, Rfl: 2 .  Coenzyme Q10 (CO Q 10) 100 MG CAPS, Take 200 mg by mouth daily., Disp: , Rfl:  .  desonide (DESOWEN) 0.05 % cream, Apply 1  application topically 2 (two) times daily., Disp: , Rfl:  .  desoximetasone (TOPICORT) 0.25 % cream, Apply 1 application topically 2 (two) times daily., Disp: 100 g, Rfl: 0 .  doxycycline (ADOXA) 150 MG tablet, Take 150 mg by mouth 2 (two) times daily., Disp: , Rfl:  .  fenofibrate (TRICOR) 145 MG tablet, Take 1 tablet (145 mg total) by mouth daily., Disp: 90 tablet, Rfl: 3 .  fluticasone (FLONASE) 50 MCG/ACT nasal spray, ONE SPRAY INTO NOSE EVERY DAY, Disp: 48 g, Rfl: 1 .  glucosamine-chondroitin 500-400 MG tablet, Take 1 tablet by mouth every morning. , Disp: , Rfl:  .  glucose blood (ACCU-CHEK AVIVA PLUS) test strip, Used to check blood sugars twice a day Dx E11.9, Disp: 200 each, Rfl: 0 .  glucose blood  test strip, Use to check blood sugar 2 times per day. DX Code: E11.9, Disp: 200 each, Rfl: 2 .  hydrochlorothiazide (HYDRODIURIL) 25 MG tablet, TAKE ONE TABLET DAILY, Disp: 90 tablet, Rfl: 1 .  hydrocortisone 2.5 % lotion, As directed, Disp: , Rfl:  .  ketoconazole (NIZORAL) 2 % cream, Apply 1 application topically daily., Disp: 60 g, Rfl: 1 .  Lancets Misc. (ACCU-CHEK FASTCLIX LANCET) KIT, AS DIRECTED, Disp: 1 kit, Rfl: 0 .  levothyroxine (SYNTHROID) 112 MCG tablet, TAKE ONE TABLET EACH DAY, Disp: 90 tablet, Rfl: 1 .  Multiple Vitamins-Minerals (ICAPS AREDS FORMULA PO), Take 1 capsule by mouth daily., Disp: , Rfl:  .  Na Sulfate-K Sulfate-Mg Sulf SOLN, Take 1 kit by mouth once., Disp: 354 mL, Rfl: 0 .  nateglinide (STARLIX) 60 MG tablet, TAKE ONE-HALF TABLET 3 TIMES DAILY WITH MEALS, Disp: 135 tablet, Rfl: 1 .  olmesartan (BENICAR) 40 MG tablet, TAKE ONE TABLET DAILY, Disp: 90 tablet, Rfl: 1 .  Omega-3 Fatty Acids (FISH OIL) 1200 MG CAPS, Take 1 capsule by mouth daily., Disp: , Rfl:  .  OVER THE COUNTER MEDICATION, Take 1 tablet by mouth daily. Tumeric, Disp: , Rfl:  .  pantoprazole (PROTONIX) 40 MG tablet, TAKE ONE TABLET EACH DAY, Disp: 90 tablet, Rfl: 0 .  pantoprazole (PROTONIX) 40 MG tablet, TAKE ONE TABLET DAILY, Disp: 90 tablet, Rfl: 0 .  pantoprazole (PROTONIX) 40 MG tablet, Take 1 tablet (40 mg total) by mouth daily. Annual appt due in June must see provider for future refills, Disp: 90 tablet, Rfl: 0 .  PROAIR HFA 108 (90 Base) MCG/ACT inhaler, 2 PUFFS FOUR TIMES DAILY AS NEEDED, Disp: 8.5 g, Rfl: 0 .  PROCTOZONE-HC 2.5 % rectal cream, APPLY TWICE DAILY RECTALLY, Disp: 30 g, Rfl: 0 .  sitaGLIPtin (JANUVIA) 100 MG tablet, Take 0.5 tablets (50 mg total) by mouth daily. Annual appt due in June must see provider for future refills, Disp: 45 tablet, Rfl: 0 .  sulfamethoxazole-trimethoprim (BACTRIM,SEPTRA) 400-80 MG tablet, Take 1 tablet by mouth 2 (two) times daily., Disp: , Rfl:  .   tamsulosin (FLOMAX) 0.4 MG CAPS capsule, TAKE ONE CAPSULE EACH DAY, Disp: 90 capsule, Rfl: 3 .  traMADol (ULTRAM) 50 MG tablet, TAKE ONE TABLET EVERY EIGHT HOURS AS NEEDED, Disp: 90 tablet, Rfl: 2  Social History   Tobacco Use  Smoking Status Former Smoker  . Packs/day: 1.50  . Years: 15.00  . Pack years: 22.50  . Types: Cigarettes, Pipe  . Quit date: 06/23/1977  . Years since quitting: 42.3  Smokeless Tobacco Never Used    Allergies  Allergen Reactions  . Cefuroxime Axetil Anaphylaxis    REACTION: anaphylaxis-ceftin   .  Cefprozil     REACTION: unknown  . Cephalosporins Nausea Only  . Crestor [Rosuvastatin Calcium]   . Lipitor [Atorvastatin]     myalgiaas  . Metformin And Related   . Rabeprazole Sodium Nausea Only    REACTION: nausea  . Statins    Objective:  There were no vitals filed for this visit. There is no height or weight on file to calculate BMI. Constitutional Well developed. Well nourished.  Vascular Dorsalis pedis pulses palpable bilaterally. Posterior tibial pulses palpable bilaterally. Capillary refill normal to all digits.  No cyanosis or clubbing noted. Pedal hair growth normal.  Neurologic Normal speech. Oriented to person, place, and time. Epicritic sensation to light touch grossly present bilaterally.  Dermatologic Nails well groomed and normal in appearance. No open wounds. No skin lesions.  Orthopedic:  Pain on palpation to the posterior tibial tendon with insertion pain.  Pain with inversion and eversion of the foot active and passive.  No pain with dorsiflexion plantarflexion of the foot.  Hammertoe contractures noted bilaterally semiflexible/reducible.  No bunion deformities noted.  Mild pes planus foot architecture noted.   Radiographs: 3 views of skeletally mature adult foot and ankle: No osseous involvement or fractures noted.  There is decreasing calcaneal inclination angle increase in talar declination angle anterior break in the cyma  line.  Midfoot arthritis noted.  No elevatus noted.  Incidental finding of plantar heel spurring noted.  No fractures noted.  Assessment:   1. Acute left ankle pain   2. Foot pain, left   3. Contusion of soft tissue   4. Hammertoe, bilateral    Plan:  Patient was evaluated and treated and all questions answered.  Left ankle soft tissue contusion with posterior tibial tendinitis -I explained patient the etiology of posterior tibial tendinitis and various treatment options were extensively discussed.  Given the amount of pain that he is having I believe patient will benefit from cam boot immobilization for next 4 weeks to allow proper healing of the soft tissue.  Given that there is no underlying fractures noted at this time no surgical intervention is necessary.  If there is there is no resolve meant of pain we will consider getting an MRI.  Bilateral pes planus deformity with underlying hammertoe contractures 2 through 5 bilaterally -I explained the patient the etiology of pes planus deformity and various treatment options were extensively discussed.  I discussed with the patient the relationship of hammertoe contractures with the pes planus deformity in extensive detail.  I believe patient will benefit from diabetic shoes to help offload the pressure sensitive areas. -He will be scheduled to see Liliane Channel for diabetic shoes.  No follow-ups on file.

## 2019-10-27 ENCOUNTER — Other Ambulatory Visit: Payer: Self-pay | Admitting: Podiatry

## 2019-10-27 DIAGNOSIS — T148XXA Other injury of unspecified body region, initial encounter: Secondary | ICD-10-CM

## 2019-10-27 DIAGNOSIS — S9002XA Contusion of left ankle, initial encounter: Secondary | ICD-10-CM

## 2019-11-15 ENCOUNTER — Other Ambulatory Visit (INDEPENDENT_AMBULATORY_CARE_PROVIDER_SITE_OTHER): Payer: Medicare Other

## 2019-11-15 DIAGNOSIS — E538 Deficiency of other specified B group vitamins: Secondary | ICD-10-CM

## 2019-11-15 DIAGNOSIS — Z125 Encounter for screening for malignant neoplasm of prostate: Secondary | ICD-10-CM | POA: Diagnosis not present

## 2019-11-15 DIAGNOSIS — N182 Chronic kidney disease, stage 2 (mild): Secondary | ICD-10-CM

## 2019-11-15 DIAGNOSIS — Z0000 Encounter for general adult medical examination without abnormal findings: Secondary | ICD-10-CM

## 2019-11-15 DIAGNOSIS — E611 Iron deficiency: Secondary | ICD-10-CM

## 2019-11-15 DIAGNOSIS — E1122 Type 2 diabetes mellitus with diabetic chronic kidney disease: Secondary | ICD-10-CM

## 2019-11-15 DIAGNOSIS — E559 Vitamin D deficiency, unspecified: Secondary | ICD-10-CM | POA: Diagnosis not present

## 2019-11-15 LAB — IBC PANEL
Iron: 112 ug/dL (ref 42–165)
Saturation Ratios: 41 % (ref 20.0–50.0)
Transferrin: 195 mg/dL — ABNORMAL LOW (ref 212.0–360.0)

## 2019-11-15 LAB — BASIC METABOLIC PANEL
BUN: 41 mg/dL — ABNORMAL HIGH (ref 6–23)
CO2: 24 mEq/L (ref 19–32)
Calcium: 9.2 mg/dL (ref 8.4–10.5)
Chloride: 104 mEq/L (ref 96–112)
Creatinine, Ser: 1.66 mg/dL — ABNORMAL HIGH (ref 0.40–1.50)
GFR: 40.41 mL/min — ABNORMAL LOW (ref >60.00)
Glucose, Bld: 160 mg/dL — ABNORMAL HIGH (ref 70–99)
Potassium: 4.6 mEq/L (ref 3.5–5.1)
Sodium: 138 mEq/L (ref 135–145)

## 2019-11-15 LAB — URINALYSIS, ROUTINE W REFLEX MICROSCOPIC
Bilirubin Urine: NEGATIVE
Hgb urine dipstick: NEGATIVE
Ketones, ur: NEGATIVE
Leukocytes,Ua: NEGATIVE
Nitrite: NEGATIVE
RBC / HPF: NONE SEEN (ref 0–?)
Specific Gravity, Urine: 1.015 (ref 1.000–1.030)
Total Protein, Urine: NEGATIVE
Urine Glucose: NEGATIVE
Urobilinogen, UA: 0.2 (ref 0.0–1.0)
pH: 5.5 (ref 5.0–8.0)

## 2019-11-15 LAB — CBC WITH DIFFERENTIAL/PLATELET
Basophils Absolute: 0.1 10*3/uL (ref 0.0–0.1)
Basophils Relative: 0.9 % (ref 0.0–3.0)
Eosinophils Absolute: 0.3 10*3/uL (ref 0.0–0.7)
Eosinophils Relative: 4.1 % (ref 0.0–5.0)
HCT: 36.2 % — ABNORMAL LOW (ref 39.0–52.0)
Hemoglobin: 12.2 g/dL — ABNORMAL LOW (ref 13.0–17.0)
Lymphocytes Relative: 27.3 % (ref 12.0–46.0)
Lymphs Abs: 2.3 10*3/uL (ref 0.7–4.0)
MCHC: 33.6 g/dL (ref 30.0–36.0)
MCV: 90.4 fl (ref 78.0–100.0)
Monocytes Absolute: 0.6 10*3/uL (ref 0.1–1.0)
Monocytes Relative: 7.1 % (ref 3.0–12.0)
Neutro Abs: 5.1 10*3/uL (ref 1.4–7.7)
Neutrophils Relative %: 60.6 % (ref 43.0–77.0)
Platelets: 213 10*3/uL (ref 150.0–400.0)
RBC: 4.01 Mil/uL — ABNORMAL LOW (ref 4.22–5.81)
RDW: 13.5 % (ref 11.5–15.5)
WBC: 8.4 10*3/uL (ref 4.0–10.5)

## 2019-11-15 LAB — TSH: TSH: 4.2 u[IU]/mL (ref 0.35–4.50)

## 2019-11-15 LAB — LIPID PANEL
Cholesterol: 223 mg/dL — ABNORMAL HIGH (ref 0–200)
HDL: 38.7 mg/dL — ABNORMAL LOW (ref >39.00)
Total CHOL/HDL Ratio: 6
Triglycerides: 443 mg/dL — ABNORMAL HIGH (ref 0.0–149.0)

## 2019-11-15 LAB — HEMOGLOBIN A1C: Hgb A1c MFr Bld: 7 % — ABNORMAL HIGH (ref 4.6–6.5)

## 2019-11-15 LAB — HEPATIC FUNCTION PANEL
ALT: 18 U/L (ref 0–53)
AST: 16 U/L (ref 0–37)
Albumin: 4 g/dL (ref 3.5–5.2)
Alkaline Phosphatase: 82 U/L (ref 39–117)
Bilirubin, Direct: 0.1 mg/dL (ref 0.0–0.3)
Total Bilirubin: 0.3 mg/dL (ref 0.2–1.2)
Total Protein: 6.7 g/dL (ref 6.0–8.3)

## 2019-11-15 LAB — VITAMIN B12: Vitamin B-12: 476 pg/mL (ref 211–911)

## 2019-11-15 LAB — MICROALBUMIN / CREATININE URINE RATIO
Creatinine,U: 108.6 mg/dL
Microalb Creat Ratio: 4.8 mg/g (ref 0.0–30.0)
Microalb, Ur: 5.3 mg/dL — ABNORMAL HIGH (ref 0.0–1.9)

## 2019-11-15 LAB — VITAMIN D 25 HYDROXY (VIT D DEFICIENCY, FRACTURES): VITD: 42.51 ng/mL (ref 30.00–100.00)

## 2019-11-15 LAB — LDL CHOLESTEROL, DIRECT: Direct LDL: 139 mg/dL

## 2019-11-15 LAB — PSA: PSA: 0.83 ng/mL (ref 0.10–4.00)

## 2019-11-19 ENCOUNTER — Encounter: Payer: Self-pay | Admitting: Internal Medicine

## 2019-11-19 ENCOUNTER — Other Ambulatory Visit: Payer: Self-pay

## 2019-11-19 ENCOUNTER — Ambulatory Visit (INDEPENDENT_AMBULATORY_CARE_PROVIDER_SITE_OTHER): Payer: Medicare Other | Admitting: Internal Medicine

## 2019-11-19 VITALS — BP 128/78 | HR 70 | Temp 98.1°F | Ht 70.5 in | Wt 251.0 lb

## 2019-11-19 DIAGNOSIS — E785 Hyperlipidemia, unspecified: Secondary | ICD-10-CM | POA: Diagnosis not present

## 2019-11-19 DIAGNOSIS — N182 Chronic kidney disease, stage 2 (mild): Secondary | ICD-10-CM

## 2019-11-19 DIAGNOSIS — Z0000 Encounter for general adult medical examination without abnormal findings: Secondary | ICD-10-CM

## 2019-11-19 DIAGNOSIS — Z1159 Encounter for screening for other viral diseases: Secondary | ICD-10-CM

## 2019-11-19 DIAGNOSIS — E1122 Type 2 diabetes mellitus with diabetic chronic kidney disease: Secondary | ICD-10-CM

## 2019-11-19 NOTE — Patient Instructions (Signed)
You are given the order for the diabetic shoes  Please continue all other medications as before, and refills have been done if requested.  Please have the pharmacy call with any other refills you may need.  Please continue your efforts at being more active, low cholesterol diet, and weight control.  You are otherwise up to date with prevention measures today.  Please keep your appointments with your specialists as you may have planned  Please make an Appointment to return in 6 months, or sooner if needed, also with Lab Appointment for testing done 3-5 days before at the [facility] (so this is for TWO appointments - please see the scheduling desk as you leave)

## 2019-11-19 NOTE — Progress Notes (Signed)
Subjective:    Patient ID: Joseph Villa, male    DOB: February 04, 1941, 79 y.o.   MRN: [id number]  HPI  Here for wellness and f/u;  Overall doing ok;  Pt denies Chest pain, worsening SOB, DOE, wheezing, orthopnea, PND, worsening LE edema, palpitations, dizziness or syncope.  Pt denies neurological change such as new headache, facial or extremity weakness.  Pt denies polydipsia, polyuria, or low sugar symptoms. Pt states overall good compliance with treatment and medications, good tolerability, and has been trying to follow appropriate diet.  Pt denies worsening depressive symptoms, suicidal ideation or panic. No fever, night sweats, wt loss, loss of appetite, or other constitutional symptoms.  Pt states good ability with ADL's, has low fall risk, home safety reviewed and adequate, no other significant changes in hearing or vision, and only occasionally active with exercise. [place] the [place]. Denies urinary symptoms such as dysuria, frequency, urgency, flank pain, hematuria or n/v, fever, chills, but does have occas nocturia at 4am and cant get back to sleep.  Has been statin intolerant in past, working on low chol diet.  Needs DM shoes Past Medical History:  Diagnosis Date  . ALLERGIC ASTHMA 11/25/2007  . ALLERGIC RHINITIS 11/25/2007  . Allergy   . Arthritis, lumbar spine 04/18/2011  . BIPOLAR DISORDER UNSPECIFIED 10/06/2009  . Blood transfusion without reported diagnosis    s/p goiter surgery age 34   . COLONIC POLYPS, HX OF 10/06/2009  . Degenerative arthritis of hip 04/18/2011  . DJD (degenerative joint disease)   . DM w/o Complication Type II 08/04/9177  . GERD 10/06/2009  . Hemorrhage yrs ago   after goiter surgery, tracheostomy inserted and later removed  . Hemorrhoid   . HYPERLIPIDEMIA 10/06/2009  . HYPERTENSION, BENIGN 07/27/2009  . HYPOTHYROIDISM 10/06/2009  . NEPHROLITHIASIS, HX OF 10/06/2009  . OBSTRUCTIVE SLEEP APNEA 11/25/2007   cpap setting of 9  . Peptic stricture  of esophagus   . RBBB (right bundle branch block)   . Sleep apnea    wears c pap   Past Surgical History:  Procedure Laterality Date  . COLONOSCOPY    . GANGLION CYST EXCISION     2-3 cysts removed  . thyroid Goiter surgery    . TONSILLECTOMY    . TOTAL HIP ARTHROPLASTY  06/16/2012   Procedure: TOTAL HIP ARTHROPLASTY ANTERIOR APPROACH;  Surgeon: Mauri Pole, MD;  Location: [facility];  Service: Orthopedics;  Laterality: Left;  . UMBILICAL HERNIA REPAIR  several yrs ago  . UPPER GASTROINTESTINAL ENDOSCOPY      reports that he quit smoking about 42 years ago. His smoking use included cigarettes and pipe. He has a 22.50 pack-year smoking history. He has never used smokeless tobacco. He reports that he does not drink alcohol and does not use drugs. family history includes Alcohol abuse in his brother; Alcoholism in his unknown relative; Breast cancer in his father; Diabetes in his brother; Drug abuse in his daughter; Throat cancer in his mother. Allergies  Allergen Reactions  . Cefuroxime Axetil Anaphylaxis    REACTION: anaphylaxis-ceftin   . Cefprozil     REACTION: unknown  . Cephalosporins Nausea Only  . Crestor [Rosuvastatin Calcium]   . Lipitor [Atorvastatin]     myalgiaas  . Metformin And Related   . Rabeprazole Sodium Nausea Only    REACTION: nausea  . Statins    Current Outpatient Medications on File Prior to Visit  Medication Sig Dispense Refill  . ACCU-CHEK  AVIVA PLUS test strip CHECK BLOOD GLUCOSE (SUGAR) TWICE DAILY 200 each 1  . Accu-Chek FastClix Lancets MISC USE TWICE DAILY 102 each 5  . ADVAIR DISKUS 250-50 MCG/DOSE AEPB INHALE 1 PUFF AND RINSE MOUTH TWICE DAILY. MAINTENANCE INHALER 180 each 1  . aspirin 81 MG chewable tablet Chew 81 mg by mouth daily.    . Blood Glucose Monitoring Suppl (ACCU-CHEK AVIVA PLUS) W/DEVICE KIT AS DIRECTED 1 kit 0  . bromocriptine (PARLODEL) 2.5 MG tablet Take 0.5 tablets (1.25 mg total) by mouth daily. 45 tablet 3  . carbamazepine  (TEGRETOL) 200 MG tablet TAKE 2 TABLETS AT BEDTIME 180 tablet 1  . Cholecalciferol (VITAMIN D3) 2000 UNITS TABS Take 2,000 Units by mouth every morning.     . clobetasol cream (TEMOVATE) 7.51 % Apply 1 application topically 2 (two) times daily as needed. For eczema. 30 g 2  . Coenzyme Q10 (CO Q 10) 100 MG CAPS Take 200 mg by mouth daily.    Marland Kitchen desonide (DESOWEN) 0.05 % cream Apply 1 application topically 2 (two) times daily.    Marland Kitchen desoximetasone (TOPICORT) 0.25 % cream Apply 1 application topically 2 (two) times daily. 100 g 0  . doxycycline (ADOXA) 150 MG tablet Take 150 mg by mouth 2 (two) times daily.    . fenofibrate (TRICOR) 145 MG tablet Take 1 tablet (145 mg total) by mouth daily. 90 tablet 3  . fluticasone (FLONASE) 50 MCG/ACT nasal spray ONE SPRAY INTO NOSE EVERY DAY 48 g 1  . glucosamine-chondroitin 500-400 MG tablet Take 1 tablet by mouth every morning.     Marland Kitchen glucose blood (ACCU-CHEK AVIVA PLUS) test strip Used to check blood sugars twice a day Dx E11.9 200 each 0  . glucose blood test strip Use to check blood sugar 2 times per day. DX Code: E11.9 200 each 2  . hydrochlorothiazide (HYDRODIURIL) 25 MG tablet TAKE ONE TABLET DAILY 90 tablet 1  . hydrocortisone 2.5 % lotion As directed    . ketoconazole (NIZORAL) 2 % cream Apply 1 application topically daily. 60 g 1  . Lancets Misc. (ACCU-CHEK FASTCLIX LANCET) KIT AS DIRECTED 1 kit 0  . levothyroxine (SYNTHROID) 112 MCG tablet TAKE ONE TABLET EACH DAY 90 tablet 1  . Multiple Vitamins-Minerals (ICAPS AREDS FORMULA PO) Take 1 capsule by mouth daily.    . Na Sulfate-K Sulfate-Mg Sulf SOLN Take 1 kit by mouth once. 354 mL 0  . nateglinide (STARLIX) 60 MG tablet TAKE ONE-HALF TABLET 3 TIMES DAILY WITH MEALS 135 tablet 1  . olmesartan (BENICAR) 40 MG tablet TAKE ONE TABLET DAILY 90 tablet 1  . Omega-3 Fatty Acids (FISH OIL) 1200 MG CAPS Take 1 capsule by mouth daily.    Marland Kitchen OVER THE COUNTER MEDICATION Take 1 tablet by mouth daily. Tumeric    .  pantoprazole (PROTONIX) 40 MG tablet TAKE ONE TABLET EACH DAY 90 tablet 0  . pantoprazole (PROTONIX) 40 MG tablet TAKE ONE TABLET DAILY 90 tablet 0  . pantoprazole (PROTONIX) 40 MG tablet Take 1 tablet (40 mg total) by mouth daily. Annual appt due in June must see provider for future refills 90 tablet 0  . PROAIR HFA 108 (90 Base) MCG/ACT inhaler 2 PUFFS FOUR TIMES DAILY AS NEEDED 8.5 g 0  . PROCTOZONE-HC 2.5 % rectal cream APPLY TWICE DAILY RECTALLY 30 g 0  . sitaGLIPtin (JANUVIA) 100 MG tablet Take 0.5 tablets (50 mg total) by mouth daily. Annual appt due in June must see provider for future  refills 45 tablet 0  . sulfamethoxazole-trimethoprim (BACTRIM,SEPTRA) 400-80 MG tablet Take 1 tablet by mouth 2 (two) times daily.    . tamsulosin (FLOMAX) 0.4 MG CAPS capsule TAKE ONE CAPSULE EACH DAY 90 capsule 3  . traMADol (ULTRAM) 50 MG tablet TAKE ONE TABLET EVERY EIGHT HOURS AS NEEDED 90 tablet 2   No current facility-administered medications on file prior to visit.   Review of Systems All otherwise neg per pt    Objective:   Physical Exam BP 128/78 (BP Location: Left Arm, Patient Position: Sitting, Cuff Size: Large)   Pulse 70   Temp 98.1 F (36.7 C) (Oral)   Ht 5' 10.5" (1.791 m)   Wt 251 lb (113.9 kg)   SpO2 95%   BMI 35.51 kg/m  VS noted,  Constitutional: Pt appears in NAD HENT: Head: NCAT.  Right Ear: External ear normal.  Left Ear: External ear normal.  Eyes: . Pupils are equal, round, and reactive to light. Conjunctivae and EOM are normal Nose: without d/c or deformity Neck: Neck supple. Gross normal ROM Cardiovascular: Normal rate and regular rhythm.   Pulmonary/Chest: Effort normal and breath sounds without rales or wheezing.  Abd:  Soft, NT, ND, + BS, no organomegaly Neurological: Pt is alert. At baseline orientation, motor grossly intact Skin: Skin is warm. No rashes, other new lesions, no LE edema Psychiatric: Pt behavior is normal without agitation  All otherwise neg  per pt Lab Results  Component Value Date   WBC 8.4 11/15/2019   HGB 12.2 (L) 11/15/2019   HCT 36.2 (L) 11/15/2019   PLT 213.0 11/15/2019   GLUCOSE 160 (H) 11/15/2019   CHOL 223 (H) 11/15/2019   TRIG 443.0 (H) 11/15/2019   HDL 38.70 (L) 11/15/2019   LDLDIRECT 139.0 11/15/2019   LDLCALC 87 12/24/2013   ALT 18 11/15/2019   AST 16 11/15/2019   NA 138 11/15/2019   K 4.6 11/15/2019   CL 104 11/15/2019   CREATININE 1.66 (H) 11/15/2019   BUN 41 (H) 11/15/2019   CO2 24 11/15/2019   TSH 4.20 11/15/2019   PSA 0.83 11/15/2019   INR 0.99 06/08/2012   HGBA1C 7.0 (H) 11/15/2019   MICROALBUR 5.3 (H) 11/15/2019      Assessment & Plan:

## 2019-11-20 ENCOUNTER — Encounter: Payer: Self-pay | Admitting: Internal Medicine

## 2019-11-20 NOTE — Assessment & Plan Note (Signed)
stable overall by history and exam, recent data reviewed with pt, and pt to continue medical treatment as before,  to f/u any worsening symptoms or concerns  

## 2019-11-20 NOTE — Assessment & Plan Note (Signed)
Cont low chol diet, has been statin intolerant

## 2019-11-20 NOTE — Assessment & Plan Note (Signed)

## 2019-11-24 ENCOUNTER — Ambulatory Visit: Payer: Medicare Other | Admitting: Podiatry

## 2019-11-24 ENCOUNTER — Other Ambulatory Visit: Payer: Self-pay

## 2019-11-24 VITALS — Temp 97.0°F

## 2019-11-24 DIAGNOSIS — N182 Chronic kidney disease, stage 2 (mild): Secondary | ICD-10-CM

## 2019-11-24 DIAGNOSIS — M2042 Other hammer toe(s) (acquired), left foot: Secondary | ICD-10-CM

## 2019-11-24 DIAGNOSIS — E1122 Type 2 diabetes mellitus with diabetic chronic kidney disease: Secondary | ICD-10-CM

## 2019-11-24 DIAGNOSIS — T148XXA Other injury of unspecified body region, initial encounter: Secondary | ICD-10-CM | POA: Diagnosis not present

## 2019-11-24 DIAGNOSIS — M2041 Other hammer toe(s) (acquired), right foot: Secondary | ICD-10-CM | POA: Diagnosis not present

## 2019-11-24 DIAGNOSIS — M79672 Pain in left foot: Secondary | ICD-10-CM

## 2019-11-24 DIAGNOSIS — M25572 Pain in left ankle and joints of left foot: Secondary | ICD-10-CM

## 2019-11-24 NOTE — Progress Notes (Addendum)
Subjective:  Patient ID: Joseph Villa, male    DOB: [birth year],  MRN: [id number]  Chief Complaint  Patient presents with  . Follow-up    L ankle. Pt stated, "I was doing better and walking better until Monday (2 days ago). I turned or twisted my foot in my sleep. Sharp, occasional 4-5/10 pain".    79 y.o. male presents with the above complaint.  Patient presents with a follow-up of left ankle pain with possible medial side posterior tibial tendinitis.  Patient states his ankle is doing really good when he is in a cam boot.  He has not been able to properly transition.  I believe patient will benefit from a little bit more cam boot immobilization followed by transition into regular sneakers with Tri-Lock ankle brace.  He denies any other acute complaints.  He would like to discuss any other treatment options.  Review of Systems: Negative except as noted in the HPI. Denies N/V/F/Ch.  Past Medical History:  Diagnosis Date  . ALLERGIC ASTHMA 11/25/2007  . ALLERGIC RHINITIS 11/25/2007  . Allergy   . Arthritis, lumbar spine 04/18/2011  . BIPOLAR DISORDER UNSPECIFIED 10/06/2009  . Blood transfusion without reported diagnosis    s/p goiter surgery age 32   . COLONIC POLYPS, HX OF 10/06/2009  . Degenerative arthritis of hip 04/18/2011  . DJD (degenerative joint disease)   . DM w/o Complication Type II 54/08/6065  . GERD 10/06/2009  . Hemorrhage yrs ago   after goiter surgery, tracheostomy inserted and later removed  . Hemorrhoid   . HYPERLIPIDEMIA 10/06/2009  . HYPERTENSION, BENIGN 07/27/2009  . HYPOTHYROIDISM 10/06/2009  . NEPHROLITHIASIS, HX OF 10/06/2009  . OBSTRUCTIVE SLEEP APNEA 11/25/2007   cpap setting of 9  . Peptic stricture of esophagus   . RBBB (right bundle branch block)   . Sleep apnea    wears c pap    Current Outpatient Medications:  .  ACCU-CHEK AVIVA PLUS test strip, CHECK BLOOD GLUCOSE (SUGAR) TWICE DAILY, Disp: 200 each, Rfl: 1 .  Accu-Chek FastClix Lancets MISC, USE TWICE  DAILY, Disp: 102 each, Rfl: 5 .  ADVAIR DISKUS 250-50 MCG/DOSE AEPB, INHALE 1 PUFF AND RINSE MOUTH TWICE DAILY. MAINTENANCE INHALER, Disp: 180 each, Rfl: 1 .  aspirin 81 MG chewable tablet, Chew 81 mg by mouth daily., Disp: , Rfl:  .  Blood Glucose Monitoring Suppl (ACCU-CHEK AVIVA PLUS) W/DEVICE KIT, AS DIRECTED, Disp: 1 kit, Rfl: 0 .  bromocriptine (PARLODEL) 2.5 MG tablet, Take 0.5 tablets (1.25 mg total) by mouth daily., Disp: 45 tablet, Rfl: 3 .  carbamazepine (TEGRETOL) 200 MG tablet, TAKE 2 TABLETS AT BEDTIME, Disp: 180 tablet, Rfl: 1 .  Cholecalciferol (VITAMIN D3) 2000 UNITS TABS, Take 2,000 Units by mouth every morning. , Disp: , Rfl:  .  clobetasol cream (TEMOVATE) 7.03 %, Apply 1 application topically 2 (two) times daily as needed. For eczema., Disp: 30 g, Rfl: 2 .  Coenzyme Q10 (CO Q 10) 100 MG CAPS, Take 200 mg by mouth daily., Disp: , Rfl:  .  desonide (DESOWEN) 0.05 % cream, Apply 1 application topically 2 (two) times daily., Disp: , Rfl:  .  desoximetasone (TOPICORT) 0.25 % cream, Apply 1 application topically 2 (two) times daily., Disp: 100 g, Rfl: 0 .  doxycycline (ADOXA) 150 MG tablet, Take 150 mg by mouth 2 (two) times daily., Disp: , Rfl:  .  fenofibrate (TRICOR) 145 MG tablet, Take 1 tablet (145 mg total) by mouth daily., Disp: 90 tablet, Rfl:  3 .  fluticasone (FLONASE) 50 MCG/ACT nasal spray, ONE SPRAY INTO NOSE EVERY DAY, Disp: 48 g, Rfl: 1 .  glucosamine-chondroitin 500-400 MG tablet, Take 1 tablet by mouth every morning. , Disp: , Rfl:  .  glucose blood (ACCU-CHEK AVIVA PLUS) test strip, Used to check blood sugars twice a day Dx E11.9, Disp: 200 each, Rfl: 0 .  glucose blood test strip, Use to check blood sugar 2 times per day. DX Code: E11.9, Disp: 200 each, Rfl: 2 .  hydrochlorothiazide (HYDRODIURIL) 25 MG tablet, TAKE ONE TABLET DAILY, Disp: 90 tablet, Rfl: 1 .  hydrocortisone 2.5 % lotion, As directed, Disp: , Rfl:  .  ketoconazole (NIZORAL) 2 % cream, Apply 1  application topically daily., Disp: 60 g, Rfl: 1 .  Lancets Misc. (ACCU-CHEK FASTCLIX LANCET) KIT, AS DIRECTED, Disp: 1 kit, Rfl: 0 .  levothyroxine (SYNTHROID) 112 MCG tablet, TAKE ONE TABLET EACH DAY, Disp: 90 tablet, Rfl: 1 .  Multiple Vitamins-Minerals (ICAPS AREDS FORMULA PO), Take 1 capsule by mouth daily., Disp: , Rfl:  .  Na Sulfate-K Sulfate-Mg Sulf SOLN, Take 1 kit by mouth once., Disp: 354 mL, Rfl: 0 .  nateglinide (STARLIX) 60 MG tablet, TAKE ONE-HALF TABLET 3 TIMES DAILY WITH MEALS, Disp: 135 tablet, Rfl: 1 .  olmesartan (BENICAR) 40 MG tablet, TAKE ONE TABLET DAILY, Disp: 90 tablet, Rfl: 1 .  Omega-3 Fatty Acids (FISH OIL) 1200 MG CAPS, Take 1 capsule by mouth daily., Disp: , Rfl:  .  OVER THE COUNTER MEDICATION, Take 1 tablet by mouth daily. Tumeric, Disp: , Rfl:  .  pantoprazole (PROTONIX) 40 MG tablet, TAKE ONE TABLET EACH DAY, Disp: 90 tablet, Rfl: 0 .  pantoprazole (PROTONIX) 40 MG tablet, TAKE ONE TABLET DAILY, Disp: 90 tablet, Rfl: 0 .  pantoprazole (PROTONIX) 40 MG tablet, Take 1 tablet (40 mg total) by mouth daily. Annual appt due in June must see provider for future refills, Disp: 90 tablet, Rfl: 0 .  PROAIR HFA 108 (90 Base) MCG/ACT inhaler, 2 PUFFS FOUR TIMES DAILY AS NEEDED, Disp: 8.5 g, Rfl: 0 .  PROCTOZONE-HC 2.5 % rectal cream, APPLY TWICE DAILY RECTALLY, Disp: 30 g, Rfl: 0 .  sitaGLIPtin (JANUVIA) 100 MG tablet, Take 0.5 tablets (50 mg total) by mouth daily. Annual appt due in June must see provider for future refills, Disp: 45 tablet, Rfl: 0 .  sulfamethoxazole-trimethoprim (BACTRIM,SEPTRA) 400-80 MG tablet, Take 1 tablet by mouth 2 (two) times daily., Disp: , Rfl:  .  tamsulosin (FLOMAX) 0.4 MG CAPS capsule, TAKE ONE CAPSULE EACH DAY, Disp: 90 capsule, Rfl: 3 .  traMADol (ULTRAM) 50 MG tablet, TAKE ONE TABLET EVERY EIGHT HOURS AS NEEDED, Disp: 90 tablet, Rfl: 2  Social History   Tobacco Use  Smoking Status Former Smoker  . Packs/day: 1.50  . Years: 15.00  .  Pack years: 22.50  . Types: Cigarettes, Pipe  . Quit date: 06/23/1977  . Years since quitting: 42.4  Smokeless Tobacco Never Used    Allergies  Allergen Reactions  . Cefuroxime Axetil Anaphylaxis    REACTION: anaphylaxis-ceftin   . Cefprozil     REACTION: unknown  . Cephalosporins Nausea Only  . Crestor [Rosuvastatin Calcium]   . Lipitor [Atorvastatin]     myalgiaas  . Metformin And Related   . Rabeprazole Sodium Nausea Only    REACTION: nausea  . Statins    Objective:   Vitals:   11/24/19 1051  Temp: (!) 97 F (36.1 C)   There is  no height or weight on file to calculate BMI. Constitutional Well developed. Well nourished.  Vascular Dorsalis pedis pulses palpable bilaterally. Posterior tibial pulses palpable bilaterally. Capillary refill normal to all digits.  No cyanosis or clubbing noted. Pedal hair growth normal.  Neurologic Normal speech. Oriented to person, place, and time. Epicritic sensation to light touch grossly present bilaterally.  Dermatologic Nails well groomed and normal in appearance. No open wounds. No skin lesions.  Orthopedic:  Moderate pain on palpation to the posterior tibial tendon with insertion pain.  Pain with inversion and eversion of the foot active and passive.  No pain with dorsiflexion plantarflexion of the foot.  Hammertoe contractures noted bilaterally semiflexible/reducible.  No bunion deformities noted.  Mild pes planus foot architecture noted.   Radiographs: 3 views of skeletally mature adult foot and ankle: No osseous involvement or fractures noted.  There is decreasing calcaneal inclination angle increase in talar declination angle anterior break in the cyma line.  Midfoot arthritis noted.  No elevatus noted.  Incidental finding of plantar heel spurring noted.  No fractures noted.  Assessment:   1. Foot pain, left   2. Acute left ankle pain   3. Contusion of soft tissue   4. Hammertoe, bilateral   5. Type 2 diabetes mellitus with  stage 2 chronic kidney disease, without long-term current use of insulin ([place])    Plan:  Patient was evaluated and treated and all questions answered.  Left ankle soft tissue contusion with posterior tibial tendinitis -It appears that the left ankle soft tissue contusion is healing adequately in the boot.  I will ask him to wear a boot for another 3 more weeks.  And therefore afterwards I will plan on transitioning him with into regular sneakers with a Tri-Lock ankle brace.  Patient agrees with the plan. -Continue using the cam boot.  As it seems to be helping.  Bilateral pes planus deformity with underlying hammertoe contractures 2 through 5 bilaterally -I explained the patient the etiology of pes planus deformity and various treatment options were extensively discussed.  I discussed with the patient the relationship of hammertoe contractures with the pes planus deformity in extensive detail.  I believe patient will benefit from diabetic shoes to help offload the pressure sensitive areas. -He will be scheduled to see Liliane Channel for diabetic shoes.  No follow-ups on file.

## 2019-12-07 ENCOUNTER — Other Ambulatory Visit: Payer: Self-pay | Admitting: Internal Medicine

## 2019-12-07 NOTE — Telephone Encounter (Signed)
Please refill as per office routine med refill policy (all routine meds refilled for 3 mo or monthly per pt preference up to one year from last visit, then month to month grace period for 3 mo, then further med refills will have to be denied)  

## 2019-12-15 ENCOUNTER — Other Ambulatory Visit: Payer: Self-pay

## 2019-12-15 ENCOUNTER — Ambulatory Visit: Payer: Medicare Other | Admitting: Podiatry

## 2019-12-15 DIAGNOSIS — M25572 Pain in left ankle and joints of left foot: Secondary | ICD-10-CM

## 2019-12-15 DIAGNOSIS — T148XXA Other injury of unspecified body region, initial encounter: Secondary | ICD-10-CM | POA: Diagnosis not present

## 2019-12-15 DIAGNOSIS — M79672 Pain in left foot: Secondary | ICD-10-CM | POA: Diagnosis not present

## 2019-12-16 ENCOUNTER — Encounter: Payer: Self-pay | Admitting: Podiatry

## 2019-12-16 NOTE — Progress Notes (Signed)
Subjective:  Patient ID: Joseph Villa, male    DOB: 01/28/1941,  MRN: [id number]  Chief Complaint  Patient presents with  . Ankle Pain    Pt states no pain.    79 y.o. male presents with the above complaint.  Patient presents with a follow-up of left ankle pain with a possible posterior tibial tendinitis.  Patient states he is doing really good.  The Tri-Lock ankle brace has helped considerably.  He would like to know if he can come out of the Tri-Lock brace.  He denies any other acute complaints.  His pain has resolved..  Review of Systems: Negative except as noted in the HPI. Denies N/V/F/Ch.  Past Medical History:  Diagnosis Date  . ALLERGIC ASTHMA 11/25/2007  . ALLERGIC RHINITIS 11/25/2007  . Allergy   . Arthritis, lumbar spine 04/18/2011  . BIPOLAR DISORDER UNSPECIFIED 10/06/2009  . Blood transfusion without reported diagnosis    s/p goiter surgery age 89   . COLONIC POLYPS, HX OF 10/06/2009  . Degenerative arthritis of hip 04/18/2011  . DJD (degenerative joint disease)   . DM w/o Complication Type II 96/12/5914  . GERD 10/06/2009  . Hemorrhage yrs ago   after goiter surgery, tracheostomy inserted and later removed  . Hemorrhoid   . HYPERLIPIDEMIA 10/06/2009  . HYPERTENSION, BENIGN 07/27/2009  . HYPOTHYROIDISM 10/06/2009  . NEPHROLITHIASIS, HX OF 10/06/2009  . OBSTRUCTIVE SLEEP APNEA 11/25/2007   cpap setting of 9  . Peptic stricture of esophagus   . RBBB (right bundle branch block)   . Sleep apnea    wears c pap    Current Outpatient Medications:  .  ACCU-CHEK AVIVA PLUS test strip, CHECK BLOOD GLUCOSE (SUGAR) TWICE DAILY, Disp: 200 each, Rfl: 1 .  Accu-Chek FastClix Lancets MISC, USE TWICE DAILY, Disp: 102 each, Rfl: 5 .  ADVAIR DISKUS 250-50 MCG/DOSE AEPB, INHALE 1 PUFF AND RINSE MOUTH TWICE DAILY. MAINTENANCE INHALER, Disp: 180 each, Rfl: 1 .  aspirin 81 MG chewable tablet, Chew 81 mg by mouth daily., Disp: , Rfl:  .  Blood Glucose Monitoring Suppl (ACCU-CHEK AVIVA PLUS)  W/DEVICE KIT, AS DIRECTED, Disp: 1 kit, Rfl: 0 .  bromocriptine (PARLODEL) 2.5 MG tablet, Take 0.5 tablets (1.25 mg total) by mouth daily., Disp: 45 tablet, Rfl: 3 .  carbamazepine (TEGRETOL) 200 MG tablet, TAKE 2 TABLETS AT BEDTIME, Disp: 180 tablet, Rfl: 1 .  Cholecalciferol (VITAMIN D3) 2000 UNITS TABS, Take 2,000 Units by mouth every morning. , Disp: , Rfl:  .  clobetasol cream (TEMOVATE) 3.84 %, Apply 1 application topically 2 (two) times daily as needed. For eczema., Disp: 30 g, Rfl: 2 .  Coenzyme Q10 (CO Q 10) 100 MG CAPS, Take 200 mg by mouth daily., Disp: , Rfl:  .  desonide (DESOWEN) 0.05 % cream, Apply 1 application topically 2 (two) times daily., Disp: , Rfl:  .  desoximetasone (TOPICORT) 0.25 % cream, Apply 1 application topically 2 (two) times daily., Disp: 100 g, Rfl: 0 .  doxycycline (ADOXA) 150 MG tablet, Take 150 mg by mouth 2 (two) times daily., Disp: , Rfl:  .  fenofibrate (TRICOR) 145 MG tablet, Take 1 tablet (145 mg total) by mouth daily., Disp: 90 tablet, Rfl: 3 .  fluticasone (FLONASE) 50 MCG/ACT nasal spray, ONE SPRAY INTO NOSE EVERY DAY, Disp: 48 g, Rfl: 1 .  glucosamine-chondroitin 500-400 MG tablet, Take 1 tablet by mouth every morning. , Disp: , Rfl:  .  glucose blood (ACCU-CHEK AVIVA PLUS) test strip, Used  to check blood sugars twice a day Dx E11.9, Disp: 200 each, Rfl: 0 .  glucose blood test strip, Use to check blood sugar 2 times per day. DX Code: E11.9, Disp: 200 each, Rfl: 2 .  hydrochlorothiazide (HYDRODIURIL) 25 MG tablet, TAKE ONE TABLET DAILY, Disp: 90 tablet, Rfl: 1 .  hydrocortisone 2.5 % lotion, As directed, Disp: , Rfl:  .  ketoconazole (NIZORAL) 2 % cream, Apply 1 application topically daily., Disp: 60 g, Rfl: 1 .  Lancets Misc. (ACCU-CHEK FASTCLIX LANCET) KIT, AS DIRECTED, Disp: 1 kit, Rfl: 0 .  levothyroxine (SYNTHROID) 112 MCG tablet, TAKE ONE TABLET EACH DAY, Disp: 90 tablet, Rfl: 1 .  Multiple Vitamins-Minerals (ICAPS AREDS FORMULA PO), Take 1  capsule by mouth daily., Disp: , Rfl:  .  Na Sulfate-K Sulfate-Mg Sulf SOLN, Take 1 kit by mouth once., Disp: 354 mL, Rfl: 0 .  nateglinide (STARLIX) 60 MG tablet, TAKE ONE-HALF TABLET 3 TIMES DAILY WITH MEALS, Disp: 135 tablet, Rfl: 1 .  olmesartan (BENICAR) 40 MG tablet, TAKE ONE TABLET DAILY, Disp: 90 tablet, Rfl: 1 .  Omega-3 Fatty Acids (FISH OIL) 1200 MG CAPS, Take 1 capsule by mouth daily., Disp: , Rfl:  .  OVER THE COUNTER MEDICATION, Take 1 tablet by mouth daily. Tumeric, Disp: , Rfl:  .  pantoprazole (PROTONIX) 40 MG tablet, TAKE ONE TABLET EACH DAY, Disp: 90 tablet, Rfl: 0 .  pantoprazole (PROTONIX) 40 MG tablet, TAKE ONE TABLET DAILY, Disp: 90 tablet, Rfl: 0 .  pantoprazole (PROTONIX) 40 MG tablet, Take 1 tablet (40 mg total) by mouth daily. Annual appt due in June must see provider for future refills, Disp: 90 tablet, Rfl: 0 .  PROAIR HFA 108 (90 Base) MCG/ACT inhaler, 2 PUFFS FOUR TIMES DAILY AS NEEDED, Disp: 8.5 g, Rfl: 0 .  PROCTOZONE-HC 2.5 % rectal cream, APPLY TWICE DAILY RECTALLY, Disp: 30 g, Rfl: 0 .  sitaGLIPtin (JANUVIA) 100 MG tablet, Take 0.5 tablets (50 mg total) by mouth daily. Annual appt due in June must see provider for future refills, Disp: 45 tablet, Rfl: 0 .  sulfamethoxazole-trimethoprim (BACTRIM,SEPTRA) 400-80 MG tablet, Take 1 tablet by mouth 2 (two) times daily., Disp: , Rfl:  .  tamsulosin (FLOMAX) 0.4 MG CAPS capsule, TAKE ONE CAPSULE EACH DAY, Disp: 90 capsule, Rfl: 3 .  traMADol (ULTRAM) 50 MG tablet, TAKE ONE TABLET EVERY EIGHT HOURS AS NEEDED, Disp: 90 tablet, Rfl: 2  Social History   Tobacco Use  Smoking Status Former Smoker  . Packs/day: 1.50  . Years: 15.00  . Pack years: 22.50  . Types: Cigarettes, Pipe  . Quit date: 06/23/1977  . Years since quitting: 42.5  Smokeless Tobacco Never Used    Allergies  Allergen Reactions  . Cefuroxime Axetil Anaphylaxis    REACTION: anaphylaxis-ceftin   . Cefprozil     REACTION: unknown  . Cephalosporins  Nausea Only  . Crestor [Rosuvastatin Calcium]   . Lipitor [Atorvastatin]     myalgiaas  . Metformin And Related   . Rabeprazole Sodium Nausea Only    REACTION: nausea  . Statins    Objective:   There were no vitals filed for this visit. There is no height or weight on file to calculate BMI. Constitutional Well developed. Well nourished.  Vascular Dorsalis pedis pulses palpable bilaterally. Posterior tibial pulses palpable bilaterally. Capillary refill normal to all digits.  No cyanosis or clubbing noted. Pedal hair growth normal.  Neurologic Normal speech. Oriented to person, place, and time. Epicritic sensation  to light touch grossly present bilaterally.  Dermatologic Nails well groomed and normal in appearance. No open wounds. No skin lesions.  Orthopedic:  No pain on palpation to the posterior tibial tendon with insertion pain.  No pain with inversion and eversion of the foot active and passive.  No pain with dorsiflexion plantarflexion of the foot.  Hammertoe contractures noted bilaterally semiflexible/reducible.  No bunion deformities noted.  Mild pes planus foot architecture noted.   Radiographs: 3 views of skeletally mature adult foot and ankle: No osseous involvement or fractures noted.  There is decreasing calcaneal inclination angle increase in talar declination angle anterior break in the cyma line.  Midfoot arthritis noted.  No elevatus noted.  Incidental finding of plantar heel spurring noted.  No fractures noted.  Assessment:   1. Foot pain, left   2. Acute left ankle pain   3. Contusion of soft tissue    Plan:  Patient was evaluated and treated and all questions answered.  Left ankle soft tissue contusion with posterior tibial tendinitis -Clinically it has resolved.  Patient has been able to transition from cam boot into utilizing Tri-Lock ankle brace.  Patient stated has been helping.  I have asked him that he can come out of the Tri-Lock ankle brace into  regular sneakers without it.  If he is unable to do so he can plan to wear the Tri-Lock brace for a little bit longer.  Patient states understanding.  Bilateral pes planus deformity with underlying hammertoe contractures 2 through 5 bilaterally -I explained the patient the etiology of pes planus deformity and various treatment options were extensively discussed.  I discussed with the patient the relationship of hammertoe contractures with the pes planus deformity in extensive detail.  I believe patient will benefit from diabetic shoes to help offload the pressure sensitive areas. -He will be scheduled to see Liliane Channel for diabetic shoes.  No follow-ups on file.

## 2019-12-21 ENCOUNTER — Other Ambulatory Visit: Payer: Self-pay | Admitting: Internal Medicine

## 2019-12-21 NOTE — Telephone Encounter (Signed)
Please refill as per office routine med refill policy (all routine meds refilled for 3 mo or monthly per pt preference up to one year from last visit, then month to month grace period for 3 mo, then further med refills will have to be denied)  

## 2019-12-23 ENCOUNTER — Other Ambulatory Visit: Payer: Self-pay

## 2019-12-23 ENCOUNTER — Ambulatory Visit: Payer: Medicare Other | Admitting: Orthotics

## 2019-12-23 DIAGNOSIS — M79672 Pain in left foot: Secondary | ICD-10-CM

## 2019-12-23 DIAGNOSIS — M2042 Other hammer toe(s) (acquired), left foot: Secondary | ICD-10-CM

## 2019-12-23 DIAGNOSIS — M25572 Pain in left ankle and joints of left foot: Secondary | ICD-10-CM

## 2019-12-23 DIAGNOSIS — E1122 Type 2 diabetes mellitus with diabetic chronic kidney disease: Secondary | ICD-10-CM

## 2019-12-23 NOTE — Progress Notes (Signed)

## 2019-12-29 DIAGNOSIS — H905 Unspecified sensorineural hearing loss: Secondary | ICD-10-CM | POA: Diagnosis not present

## 2020-01-04 ENCOUNTER — Other Ambulatory Visit: Payer: Self-pay | Admitting: Internal Medicine

## 2020-01-04 NOTE — Telephone Encounter (Signed)
Please refill as per office routine med refill policy (all routine meds refilled for 3 mo or monthly per pt preference up to one year from last visit, then month to month grace period for 3 mo, then further med refills will have to be denied)  

## 2020-01-11 ENCOUNTER — Other Ambulatory Visit: Payer: Self-pay | Admitting: Internal Medicine

## 2020-01-12 NOTE — Telephone Encounter (Signed)
Please refill as per office routine med refill policy (all routine meds refilled for 3 mo or monthly per pt preference up to one year from last visit, then month to month grace period for 3 mo, then further med refills will have to be denied)  

## 2020-01-27 DIAGNOSIS — L218 Other seborrheic dermatitis: Secondary | ICD-10-CM | POA: Diagnosis not present

## 2020-01-27 DIAGNOSIS — Z85828 Personal history of other malignant neoplasm of skin: Secondary | ICD-10-CM | POA: Diagnosis not present

## 2020-01-27 DIAGNOSIS — L570 Actinic keratosis: Secondary | ICD-10-CM | POA: Diagnosis not present

## 2020-01-27 DIAGNOSIS — L821 Other seborrheic keratosis: Secondary | ICD-10-CM | POA: Diagnosis not present

## 2020-01-29 ENCOUNTER — Other Ambulatory Visit: Payer: Self-pay | Admitting: Internal Medicine

## 2020-01-30 NOTE — Telephone Encounter (Signed)
Please refill as per office routine med refill policy (all routine meds refilled for 3 mo or monthly per pt preference up to one year from last visit, then month to month grace period for 3 mo, then further med refills will have to be denied)  

## 2020-02-11 ENCOUNTER — Other Ambulatory Visit: Payer: Self-pay

## 2020-02-11 ENCOUNTER — Ambulatory Visit: Payer: Medicare Other | Admitting: Orthotics

## 2020-02-11 DIAGNOSIS — M2141 Flat foot [pes planus] (acquired), right foot: Secondary | ICD-10-CM | POA: Diagnosis not present

## 2020-02-11 DIAGNOSIS — M2041 Other hammer toe(s) (acquired), right foot: Secondary | ICD-10-CM | POA: Diagnosis not present

## 2020-02-11 DIAGNOSIS — E119 Type 2 diabetes mellitus without complications: Secondary | ICD-10-CM | POA: Diagnosis not present

## 2020-02-11 DIAGNOSIS — M2142 Flat foot [pes planus] (acquired), left foot: Secondary | ICD-10-CM | POA: Diagnosis not present

## 2020-02-11 DIAGNOSIS — M216X1 Other acquired deformities of right foot: Secondary | ICD-10-CM | POA: Diagnosis not present

## 2020-02-11 DIAGNOSIS — M216X2 Other acquired deformities of left foot: Secondary | ICD-10-CM

## 2020-02-11 DIAGNOSIS — M2042 Other hammer toe(s) (acquired), left foot: Secondary | ICD-10-CM | POA: Diagnosis not present

## 2020-02-14 ENCOUNTER — Other Ambulatory Visit: Payer: Self-pay | Admitting: Endocrinology

## 2020-02-14 DIAGNOSIS — G4733 Obstructive sleep apnea (adult) (pediatric): Secondary | ICD-10-CM | POA: Diagnosis not present

## 2020-02-14 DIAGNOSIS — E1122 Type 2 diabetes mellitus with diabetic chronic kidney disease: Secondary | ICD-10-CM

## 2020-02-14 DIAGNOSIS — N182 Chronic kidney disease, stage 2 (mild): Secondary | ICD-10-CM

## 2020-03-08 ENCOUNTER — Other Ambulatory Visit: Payer: Self-pay | Admitting: Internal Medicine

## 2020-03-08 NOTE — Telephone Encounter (Signed)
Please refill as per office routine med refill policy (all routine meds refilled for 3 mo or monthly per pt preference up to one year from last visit, then month to month grace period for 3 mo, then further med refills will have to be denied)  

## 2020-03-27 ENCOUNTER — Other Ambulatory Visit: Payer: Self-pay | Admitting: Endocrinology

## 2020-03-27 DIAGNOSIS — E1122 Type 2 diabetes mellitus with diabetic chronic kidney disease: Secondary | ICD-10-CM

## 2020-03-27 DIAGNOSIS — N182 Chronic kidney disease, stage 2 (mild): Secondary | ICD-10-CM

## 2020-03-28 ENCOUNTER — Other Ambulatory Visit: Payer: Self-pay | Admitting: Endocrinology

## 2020-03-29 ENCOUNTER — Other Ambulatory Visit: Payer: Self-pay | Admitting: Internal Medicine

## 2020-03-29 NOTE — Telephone Encounter (Signed)
Please refill as per office routine med refill policy (all routine meds refilled for 3 mo or monthly per pt preference up to one year from last visit, then month to month grace period for 3 mo, then further med refills will have to be denied)  

## 2020-03-31 DIAGNOSIS — M109 Gout, unspecified: Secondary | ICD-10-CM | POA: Diagnosis not present

## 2020-03-31 DIAGNOSIS — K219 Gastro-esophageal reflux disease without esophagitis: Secondary | ICD-10-CM | POA: Diagnosis not present

## 2020-03-31 DIAGNOSIS — E1122 Type 2 diabetes mellitus with diabetic chronic kidney disease: Secondary | ICD-10-CM | POA: Diagnosis not present

## 2020-03-31 DIAGNOSIS — N183 Chronic kidney disease, stage 3 unspecified: Secondary | ICD-10-CM | POA: Diagnosis not present

## 2020-03-31 DIAGNOSIS — I129 Hypertensive chronic kidney disease with stage 1 through stage 4 chronic kidney disease, or unspecified chronic kidney disease: Secondary | ICD-10-CM | POA: Diagnosis not present

## 2020-04-15 ENCOUNTER — Ambulatory Visit (INDEPENDENT_AMBULATORY_CARE_PROVIDER_SITE_OTHER): Payer: Medicare Other

## 2020-04-15 DIAGNOSIS — Z23 Encounter for immunization: Secondary | ICD-10-CM | POA: Diagnosis not present

## 2020-05-22 ENCOUNTER — Other Ambulatory Visit: Payer: Self-pay

## 2020-05-22 ENCOUNTER — Ambulatory Visit (INDEPENDENT_AMBULATORY_CARE_PROVIDER_SITE_OTHER): Payer: Medicare Other | Admitting: Internal Medicine

## 2020-05-22 ENCOUNTER — Encounter: Payer: Self-pay | Admitting: Internal Medicine

## 2020-05-22 ENCOUNTER — Other Ambulatory Visit (INDEPENDENT_AMBULATORY_CARE_PROVIDER_SITE_OTHER): Payer: Medicare Other

## 2020-05-22 VITALS — BP 120/72 | HR 71 | Temp 98.4°F | Ht 70.5 in | Wt 258.0 lb

## 2020-05-22 DIAGNOSIS — Z1159 Encounter for screening for other viral diseases: Secondary | ICD-10-CM | POA: Diagnosis not present

## 2020-05-22 DIAGNOSIS — N1831 Chronic kidney disease, stage 3a: Secondary | ICD-10-CM | POA: Diagnosis not present

## 2020-05-22 DIAGNOSIS — Z0000 Encounter for general adult medical examination without abnormal findings: Secondary | ICD-10-CM

## 2020-05-22 DIAGNOSIS — E785 Hyperlipidemia, unspecified: Secondary | ICD-10-CM | POA: Diagnosis not present

## 2020-05-22 DIAGNOSIS — E1122 Type 2 diabetes mellitus with diabetic chronic kidney disease: Secondary | ICD-10-CM

## 2020-05-22 DIAGNOSIS — I10 Essential (primary) hypertension: Secondary | ICD-10-CM

## 2020-05-22 DIAGNOSIS — G720 Drug-induced myopathy: Secondary | ICD-10-CM | POA: Diagnosis not present

## 2020-05-22 DIAGNOSIS — N182 Chronic kidney disease, stage 2 (mild): Secondary | ICD-10-CM

## 2020-05-22 DIAGNOSIS — T466X5A Adverse effect of antihyperlipidemic and antiarteriosclerotic drugs, initial encounter: Secondary | ICD-10-CM

## 2020-05-22 DIAGNOSIS — E559 Vitamin D deficiency, unspecified: Secondary | ICD-10-CM

## 2020-05-22 DIAGNOSIS — E538 Deficiency of other specified B group vitamins: Secondary | ICD-10-CM

## 2020-05-22 DIAGNOSIS — E039 Hypothyroidism, unspecified: Secondary | ICD-10-CM

## 2020-05-22 LAB — BASIC METABOLIC PANEL
BUN: 42 mg/dL — ABNORMAL HIGH (ref 6–23)
CO2: 22 mEq/L (ref 19–32)
Calcium: 9.1 mg/dL (ref 8.4–10.5)
Chloride: 104 mEq/L (ref 96–112)
Creatinine, Ser: 1.56 mg/dL — ABNORMAL HIGH (ref 0.40–1.50)
GFR: 42.69 mL/min — ABNORMAL LOW (ref >60.00)
Glucose, Bld: 179 mg/dL — ABNORMAL HIGH (ref 70–99)
Potassium: 4.7 mEq/L (ref 3.5–5.1)
Sodium: 136 mEq/L (ref 135–145)

## 2020-05-22 LAB — HEPATIC FUNCTION PANEL
ALT: 22 U/L (ref 0–53)
AST: 18 U/L (ref 0–37)
Albumin: 4.1 g/dL (ref 3.5–5.2)
Alkaline Phosphatase: 85 U/L (ref 39–117)
Bilirubin, Direct: 0.1 mg/dL (ref 0.0–0.3)
Total Bilirubin: 0.3 mg/dL (ref 0.2–1.2)
Total Protein: 7.3 g/dL (ref 6.0–8.3)

## 2020-05-22 LAB — LIPID PANEL
Cholesterol: 229 mg/dL — ABNORMAL HIGH (ref 0–200)
HDL: 38.9 mg/dL — ABNORMAL LOW (ref >39.00)
NonHDL: 190.46
Total CHOL/HDL Ratio: 6
Triglycerides: 383 mg/dL — ABNORMAL HIGH (ref 0.0–149.0)
VLDL: 76.6 mg/dL — ABNORMAL HIGH (ref 0.0–40.0)

## 2020-05-22 LAB — LDL CHOLESTEROL, DIRECT: Direct LDL: 147 mg/dL

## 2020-05-22 LAB — HEMOGLOBIN A1C: Hgb A1c MFr Bld: 7.2 % — ABNORMAL HIGH (ref 4.6–6.5)

## 2020-05-22 MED ORDER — DAPAGLIFLOZIN PROPANEDIOL 5 MG PO TABS: 5.0000 mg | ORAL_TABLET | Freq: Every day | ORAL | 3 refills | Status: DC

## 2020-05-22 NOTE — Progress Notes (Deleted)
   Subjective:    Patient ID: Joseph Villa, male    DOB: 1940-11-21, 79 y.o.   MRN: [id number]  HPI    Review of Systems     Objective:   Physical Exam        Assessment & Plan:

## 2020-05-22 NOTE — Patient Instructions (Signed)
Please take all new medication as prescribed - the farxiga 5 mg per day  Please continue all other medications as before, and refills have been done if requested.  Please have the pharmacy call with any other refills you may need.  Please continue your efforts at being more active, low cholesterol diet, and weight control.  Please keep your appointments with your specialists as you may have planned  Please make an Appointment to return in 6 months, or sooner if needed, also with Lab Appointment for testing done 3-5 days before at the FIRST FLOOR Lab (so this is for TWO appointments - please see the scheduling desk as you leave)  Due to the ongoing Covid 19 pandemic, our lab now requires an appointment for any labs done at our office.  If you need labs done and do not have an appointment, please call our office ahead of time to schedule before presenting to the lab for your testing.      

## 2020-05-23 LAB — HEPATITIS C ANTIBODY
Hepatitis C Ab: NONREACTIVE
SIGNAL TO CUT-OFF: 0.01 (ref <1.00)

## 2020-05-31 ENCOUNTER — Encounter: Payer: Self-pay | Admitting: Internal Medicine

## 2020-05-31 NOTE — Assessment & Plan Note (Signed)
Unable to tolerate statin, declines try again

## 2020-05-31 NOTE — Assessment & Plan Note (Signed)
BP Readings from Last 3 Encounters:  05/22/20 120/72  11/19/19 128/78  05/21/19 134/82  stable overall by history and exam, recent data reviewed with pt, and pt to continue medical treatment as before,  to f/u any worsening symptoms or concerns

## 2020-05-31 NOTE — Assessment & Plan Note (Addendum)
Lab Results  Component Value Date   HGBA1C 7.2 (H) 05/22/2020   Mild elevated, to start farxiga 5 qd in light of the ckd, o/w stable overall by history and exam, recent data reviewed with pt, and pt to continue medical treatment as before,  to f/u any worsening symptoms or concerns

## 2020-05-31 NOTE — Assessment & Plan Note (Signed)
stable overall by history and exam, recent data reviewed with pt, and pt to continue medical treatment as before,  to f/u any worsening symptoms or concerns, cont low chol diet Lab Results  Component Value Date   LDLCALC 87 12/24/2013

## 2020-05-31 NOTE — Assessment & Plan Note (Signed)
Lab Results  Component Value Date   CREATININE 1.56 (H) 05/22/2020  stable overall by history and exam, recent data reviewed with pt, and pt to continue medical treatment as before,  to f/u any worsening symptoms or concerns

## 2020-05-31 NOTE — Assessment & Plan Note (Signed)
Lab Results  Component Value Date   TSH 4.20 11/15/2019   stable overall by history and exam, recent data reviewed with pt, and pt to continue medical treatment as before,  to f/u any worsening symptoms or concerns

## 2020-05-31 NOTE — Progress Notes (Addendum)
Established Patient Office Visit  Subjective:  Patient ID: Joseph Villa, male    DOB: 23-Mar-1941  Age: 79 y.o. MRN: [id number]      Chief Complaint: (concise statement describing the symptom, problem, condition, diagnosis, physician recommended return, or other factor as reason for encounter) follow up HTN, HLD, dM, CKD, statin myopathy, hypothyoridism       HPI:  Joseph Villa is a 79 y.o. male here to f/u; overall doing ok,  Pt denies chest pain, increasing sob or doe, wheezing, orthopnea, PND, increased LE swelling, palpitations, dizziness or syncope.  Pt denies new neurological symptoms such as new headache, or facial or extremity weakness or numbness.  Pt denies polydipsia, polyuria, or symptomatic low sugars. Pt states overall good compliance with meds, mostly trying to follow appropriate diet, with wt overall stable,  but little exercise however. Has been unable to tolerate statin in past, does not want restart.  Denies hyper or hypo thyroid symptoms such as voice, skin or hair change.   .        Wt Readings from Last 3 Encounters:  05/22/20 258 lb (117 kg)  11/19/19 251 lb (113.9 kg)  05/21/19 250 lb (113.4 kg)   BP Readings from Last 3 Encounters:  05/22/20 120/72  11/19/19 128/78  05/21/19 134/82      Past Medical History:  Diagnosis Date  . ALLERGIC ASTHMA 11/25/2007  . ALLERGIC RHINITIS 11/25/2007  . Allergy   . Arthritis, lumbar spine 04/18/2011  . BIPOLAR DISORDER UNSPECIFIED 10/06/2009  . Blood transfusion without reported diagnosis    s/p goiter surgery age 41   . COLONIC POLYPS, HX OF 10/06/2009  . Degenerative arthritis of hip 04/18/2011  . DJD (degenerative joint disease)   . DM w/o Complication Type II 16/11/628  . GERD 10/06/2009  . Hemorrhage yrs ago   after goiter surgery, tracheostomy inserted and later removed  . Hemorrhoid   . HYPERLIPIDEMIA 10/06/2009  . HYPERTENSION, BENIGN 07/27/2009  . HYPOTHYROIDISM 10/06/2009  . NEPHROLITHIASIS, HX OF 10/06/2009  .  OBSTRUCTIVE SLEEP APNEA 11/25/2007   cpap setting of 9  . Peptic stricture of esophagus   . RBBB (right bundle branch block)   . Sleep apnea    wears c pap   Past Surgical History:  Procedure Laterality Date  . COLONOSCOPY    . GANGLION CYST EXCISION     2-3 cysts removed  . thyroid Goiter surgery    . TONSILLECTOMY    . TOTAL HIP ARTHROPLASTY  06/16/2012   Procedure: TOTAL HIP ARTHROPLASTY ANTERIOR APPROACH;  Surgeon: Mauri Pole, MD;  Location: [facility];  Service: Orthopedics;  Laterality: Left;  . UMBILICAL HERNIA REPAIR  several yrs ago  . UPPER GASTROINTESTINAL ENDOSCOPY      reports that he quit smoking about 42 years ago. His smoking use included cigarettes and pipe. He has a 22.50 pack-year smoking history. He has never used smokeless tobacco. He reports that he does not drink alcohol and does not use drugs. family history includes Alcohol abuse in his brother; Alcoholism in an other family member; Breast cancer in his father; Diabetes in his brother; Drug abuse in his daughter; Throat cancer in his mother. Allergies  Allergen Reactions  . Cefuroxime Axetil Anaphylaxis    REACTION: anaphylaxis-ceftin   . Cefprozil     REACTION: unknown  . Cephalosporins Nausea Only  . Crestor [Rosuvastatin Calcium]   . Lipitor [Atorvastatin]     myalgiaas  . Metformin And Related   .  Rabeprazole Sodium Nausea Only    REACTION: nausea  . Statins    Current Outpatient Medications on File Prior to Visit  Medication Sig Dispense Refill  . ACCU-CHEK AVIVA PLUS test strip CHECK BLOOD GLUCOSE (SUGAR) TWICE DAILY 200 each 1  . Accu-Chek FastClix Lancets MISC USE TWICE DAILY 102 each 5  . ADVAIR DISKUS 250-50 MCG/DOSE AEPB INHALE 1 PUFF AND RINSE MOUTH TWICE DAILY. MAINTENANCE INHALER 180 each 1  . aspirin 81 MG chewable tablet Chew 81 mg by mouth daily.    . Blood Glucose Monitoring Suppl (ACCU-CHEK AVIVA PLUS) w/Device KIT AS DIRECTED 1 kit 1  . bromocriptine (PARLODEL) 2.5 MG tablet TAKE  ONE-HALF TABLET DAILY 45 tablet 3  . carbamazepine (TEGRETOL) 200 MG tablet TAKE 2 TABLETS AT BEDTIME 180 tablet 1  . Cholecalciferol (VITAMIN D3) 2000 UNITS TABS Take 2,000 Units by mouth every morning.     . clobetasol cream (TEMOVATE) 3.84 % Apply 1 application topically 2 (two) times daily as needed. For eczema. 30 g 2  . Coenzyme Q10 (CO Q 10) 100 MG CAPS Take 200 mg by mouth daily.    Marland Kitchen desonide (DESOWEN) 0.05 % cream Apply 1 application topically 2 (two) times daily.    Marland Kitchen desoximetasone (TOPICORT) 0.25 % cream Apply 1 application topically 2 (two) times daily. 100 g 0  . fenofibrate (TRICOR) 145 MG tablet Take 1 tablet (145 mg total) by mouth daily. 90 tablet 3  . fluticasone (FLONASE) 50 MCG/ACT nasal spray ONE SPRAY INTO NOSE EVERY DAY 48 g 1  . glucosamine-chondroitin 500-400 MG tablet Take 1 tablet by mouth every morning.    Marland Kitchen glucose blood (ACCU-CHEK AVIVA PLUS) test strip Used to check blood sugars twice a day Dx E11.9 200 each 0  . glucose blood test strip Use to check blood sugar 2 times per day. DX Code: E11.9 200 each 2  . hydrochlorothiazide (HYDRODIURIL) 25 MG tablet TAKE ONE TABLET DAILY 90 tablet 1  . hydrocortisone 2.5 % lotion As directed    . JANUVIA 100 MG tablet TAKE ONE-HALF TABLET DAILY 45 tablet 0  . ketoconazole (NIZORAL) 2 % cream Apply 1 application topically daily. 60 g 1  . Lancets Misc. (ACCU-CHEK FASTCLIX LANCET) KIT AS DIRECTED 1 kit 0  . levothyroxine (SYNTHROID) 112 MCG tablet TAKE ONE TABLET EACH DAY 90 tablet 2  . Multiple Vitamins-Minerals (ICAPS AREDS FORMULA PO) Take 1 capsule by mouth daily.    . Na Sulfate-K Sulfate-Mg Sulf SOLN Take 1 kit by mouth once. 354 mL 0  . nateglinide (STARLIX) 60 MG tablet TAKE ONE-HALF TABLET 3 TIMES DAILY WITH MEALS 135 tablet 1  . olmesartan (BENICAR) 40 MG tablet TAKE ONE TABLET DAILY 90 tablet 1  . Omega-3 Fatty Acids (FISH OIL) 1200 MG CAPS Take 1 capsule by mouth daily.    Marland Kitchen OVER THE COUNTER MEDICATION Take 1  tablet by mouth daily. Tumeric    . pantoprazole (PROTONIX) 40 MG tablet Take 1 tablet (40 mg total) by mouth daily. Annual appt due in June must see provider for future refills 90 tablet 0  . PROAIR HFA 108 (90 Base) MCG/ACT inhaler 2 PUFFS FOUR TIMES DAILY AS NEEDED 8.5 g 0  . PROCTOZONE-HC 2.5 % rectal cream APPLY TWICE DAILY RECTALLY 30 g 0  . sulfamethoxazole-trimethoprim (BACTRIM,SEPTRA) 400-80 MG tablet Take 1 tablet by mouth 2 (two) times daily.    . tamsulosin (FLOMAX) 0.4 MG CAPS capsule TAKE ONE CAPSULE EACH DAY 90 capsule 3  .  traMADol (ULTRAM) 50 MG tablet TAKE ONE TABLET EVERY EIGHT HOURS AS NEEDED 90 tablet 2   No current facility-administered medications on file prior to visit.        ROS:  All others reviewed and negative.  Objective        PE:  BP 120/72 (BP Location: Left Arm, Patient Position: Sitting, Cuff Size: Large)   Pulse 71   Temp 98.4 F (36.9 C) (Oral)   Ht 5' 10.5" (1.791 m)   Wt 258 lb (117 kg)   SpO2 98%   BMI 36.50 kg/m                 Constitutional: Pt appears in NAD               HENT: Head: NCAT.                Right Ear: External ear normal.                 Left Ear: External ear normal.                Eyes: . Pupils are equal, round, and reactive to light. Conjunctivae and EOM are normal               Nose: without d/c or deformity               Neck: Neck supple. Gross normal ROM               Cardiovascular: Normal rate and regular rhythm.                 Pulmonary/Chest: Effort normal and breath sounds without rales or wheezing.                Abd:  Soft, NT, ND, + BS, no organomegaly               Neurological: Pt is alert. At baseline orientation, motor grossly intact               Skin: Skin is warm. No rashes, no other new lesions, LE edema - none               Psychiatric: Pt behavior is normal without agitation   Assessment/Plan:  Joseph Villa is a 79 y.o. White or Caucasian [1] male with  has a past medical history of  ALLERGIC ASTHMA (11/25/2007), ALLERGIC RHINITIS (11/25/2007), Allergy, Arthritis, lumbar spine (04/18/2011), BIPOLAR DISORDER UNSPECIFIED (10/06/2009), Blood transfusion without reported diagnosis, COLONIC POLYPS, HX OF (10/06/2009), Degenerative arthritis of hip (04/18/2011), DJD (degenerative joint disease), DM w/o Complication Type II (40/06/270), GERD (10/06/2009), Hemorrhage (yrs ago), Hemorrhoid, HYPERLIPIDEMIA (10/06/2009), HYPERTENSION, BENIGN (07/27/2009), HYPOTHYROIDISM (10/06/2009), NEPHROLITHIASIS, HX OF (10/06/2009), OBSTRUCTIVE SLEEP APNEA (11/25/2007), Peptic stricture of esophagus, RBBB (right bundle branch block), and Sleep apnea.   Assessment Plan  See problem oriented assessment and plan Labs reviewed for each problem: Lab Results  Component Value Date   WBC 8.4 11/15/2019   HGB 12.2 (L) 11/15/2019   HCT 36.2 (L) 11/15/2019   PLT 213.0 11/15/2019   GLUCOSE 179 (H) 05/22/2020   CHOL 229 (H) 05/22/2020   TRIG 383.0 (H) 05/22/2020   HDL 38.90 (L) 05/22/2020   LDLDIRECT 147.0 05/22/2020   LDLCALC 87 12/24/2013   ALT 22 05/22/2020   AST 18 05/22/2020   NA 136 05/22/2020   K 4.7 05/22/2020   CL 104 05/22/2020   CREATININE 1.56 (H) 05/22/2020   BUN  42 (H) 05/22/2020   CO2 22 05/22/2020   TSH 4.20 11/15/2019   PSA 0.83 11/15/2019   INR 0.99 06/08/2012   HGBA1C 7.2 (H) 05/22/2020   MICROALBUR 5.3 (H) 11/15/2019    Micro: none  Cardiac tracings I have personally interpreted today:  none  Pertinent Radiological findings (summarize): none   I spent total 36 minutes in caring for the patient for this visit:  1) by communicating with the patient  during the visit  2) by review of pertinent vital sign data, physical examination and labs as documented in the assessment and plan  3) by review of pertinent imaging - none today  4) by review of pertinent procedures - none today  5) by obtaining and reviewing separately obtained information from family/caretaker and Care Everywhere -  none today  6) by ordering medications  7) by ordering tests  8) by documenting all of this clinical information in the EHR including the management of each problem noted today in assessment and plan   There are no preventive care reminders to display for this patient.     Assessment & Plan:   Problem List Items Addressed This Visit      Medium   Statin myopathy    Unable to tolerate statin, declines try again      Hypothyroidism    Lab Results  Component Value Date   TSH 4.20 11/15/2019   stable overall by history and exam, recent data reviewed with pt, and pt to continue medical treatment as before,  to f/u any worsening symptoms or concerns       Hyperlipidemia    stable overall by history and exam, recent data reviewed with pt, and pt to continue medical treatment as before,  to f/u any worsening symptoms or concerns, cont low chol diet Lab Results  Component Value Date   LDLCALC 87 12/24/2013         Essential hypertension    BP Readings from Last 3 Encounters:  05/22/20 120/72  11/19/19 128/78  05/21/19 134/82  stable overall by history and exam, recent data reviewed with pt, and pt to continue medical treatment as before,  to f/u any worsening symptoms or concerns       Diabetes ([place]) - Primary    Lab Results  Component Value Date   HGBA1C 7.2 (H) 05/22/2020   Mild elevated, to start farxiga 5 qd in light of the ckd, o/w stable overall by history and exam, recent data reviewed with pt, and pt to continue medical treatment as before,  to f/u any worsening symptoms or concerns       Relevant Medications   dapagliflozin propanediol (FARXIGA) 5 MG TABS tablet   Other Relevant Orders   Microalbumin / creatinine urine ratio   Hemoglobin A1c   Lipid panel   Basic metabolic panel   Hepatic function panel   CBC with Differential/Platelet   TSH   Urinalysis, Routine w reflex microscopic   CKD (chronic kidney disease), stage III ([place])    Lab Results   Component Value Date   CREATININE 1.56 (H) 05/22/2020  stable overall by history and exam, recent data reviewed with pt, and pt to continue medical treatment as before,  to f/u any worsening symptoms or concerns         Unprioritized   Preventative health care   Relevant Orders   PSA    Other Visit Diagnoses    Need for hepatitis C screening test  Relevant Orders   Hepatitis C Antibody   Vitamin D deficiency       Relevant Orders   VITAMIN D 25 Hydroxy (Vit-D Deficiency, Fractures)   Vitamin B 12 deficiency       Relevant Orders   Vitamin B12      Meds ordered this encounter  Medications  . dapagliflozin propanediol (FARXIGA) 5 MG TABS tablet    Sig: Take 1 tablet (5 mg total) by mouth daily before breakfast.    Dispense:  90 tablet    Refill:  3    Follow-up: Return in about 6 months (around 11/20/2020).   Cathlean Cower, MD 05/31/2020 5:28 PM [facility]

## 2020-05-31 NOTE — Addendum Note (Signed)
Addended by: Biagio Borg on: 05/31/2020 06:02 PM   Modules accepted: Orders

## 2020-06-16 ENCOUNTER — Other Ambulatory Visit: Payer: Self-pay | Admitting: Internal Medicine

## 2020-06-16 NOTE — Telephone Encounter (Signed)
Please refill as per office routine med refill policy (all routine meds refilled for 3 mo or monthly per pt preference up to one year from last visit, then month to month grace period for 3 mo, then further med refills will have to be denied)  

## 2020-06-30 ENCOUNTER — Other Ambulatory Visit: Payer: Self-pay | Admitting: Internal Medicine

## 2020-06-30 NOTE — Telephone Encounter (Signed)
Please refill as per office routine med refill policy (all routine meds refilled for 3 mo or monthly per pt preference up to one year from last visit, then month to month grace period for 3 mo, then further med refills will have to be denied)  

## 2020-07-11 ENCOUNTER — Other Ambulatory Visit: Payer: Self-pay | Admitting: Internal Medicine

## 2020-07-11 NOTE — Telephone Encounter (Signed)
Please refill as per office routine med refill policy (all routine meds refilled for 3 mo or monthly per pt preference up to one year from last visit, then month to month grace period for 3 mo, then further med refills will have to be denied)  

## 2020-07-22 DIAGNOSIS — G4733 Obstructive sleep apnea (adult) (pediatric): Secondary | ICD-10-CM | POA: Diagnosis not present

## 2020-07-24 DIAGNOSIS — M25511 Pain in right shoulder: Secondary | ICD-10-CM | POA: Diagnosis not present

## 2020-07-24 DIAGNOSIS — M25512 Pain in left shoulder: Secondary | ICD-10-CM | POA: Diagnosis not present

## 2020-08-04 ENCOUNTER — Other Ambulatory Visit: Payer: Self-pay | Admitting: Internal Medicine

## 2020-08-04 NOTE — Telephone Encounter (Signed)
Please refill as per office routine med refill policy (all routine meds refilled for 3 mo or monthly per pt preference up to one year from last visit, then month to month grace period for 3 mo, then further med refills will have to be denied)  

## 2020-08-16 DIAGNOSIS — M19011 Primary osteoarthritis, right shoulder: Secondary | ICD-10-CM | POA: Diagnosis not present

## 2020-09-06 ENCOUNTER — Ambulatory Visit: Payer: Medicare Other | Admitting: Orthopedic Surgery

## 2020-09-07 DIAGNOSIS — M25511 Pain in right shoulder: Secondary | ICD-10-CM | POA: Diagnosis not present

## 2020-09-19 DIAGNOSIS — M19011 Primary osteoarthritis, right shoulder: Secondary | ICD-10-CM | POA: Diagnosis not present

## 2020-09-29 DIAGNOSIS — H353132 Nonexudative age-related macular degeneration, bilateral, intermediate dry stage: Secondary | ICD-10-CM | POA: Diagnosis not present

## 2020-09-29 DIAGNOSIS — H2513 Age-related nuclear cataract, bilateral: Secondary | ICD-10-CM | POA: Diagnosis not present

## 2020-09-29 DIAGNOSIS — H5203 Hypermetropia, bilateral: Secondary | ICD-10-CM | POA: Diagnosis not present

## 2020-09-29 LAB — HM DIABETES EYE EXAM

## 2020-10-03 ENCOUNTER — Other Ambulatory Visit: Payer: Self-pay | Admitting: Internal Medicine

## 2020-10-06 ENCOUNTER — Encounter: Payer: Self-pay | Admitting: Internal Medicine

## 2020-10-10 ENCOUNTER — Other Ambulatory Visit: Payer: Self-pay | Admitting: Internal Medicine

## 2020-10-10 NOTE — Telephone Encounter (Signed)
Please refill as per office routine med refill policy (all routine meds refilled for 3 mo or monthly per pt preference up to one year from last visit, then month to month grace period for 3 mo, then further med refills will have to be denied)  

## 2020-10-19 ENCOUNTER — Other Ambulatory Visit: Payer: Self-pay | Admitting: Internal Medicine

## 2020-10-23 ENCOUNTER — Other Ambulatory Visit (INDEPENDENT_AMBULATORY_CARE_PROVIDER_SITE_OTHER): Payer: Medicare Other

## 2020-10-23 DIAGNOSIS — N182 Chronic kidney disease, stage 2 (mild): Secondary | ICD-10-CM | POA: Diagnosis not present

## 2020-10-23 DIAGNOSIS — E559 Vitamin D deficiency, unspecified: Secondary | ICD-10-CM | POA: Diagnosis not present

## 2020-10-23 DIAGNOSIS — Z0000 Encounter for general adult medical examination without abnormal findings: Secondary | ICD-10-CM

## 2020-10-23 DIAGNOSIS — E1122 Type 2 diabetes mellitus with diabetic chronic kidney disease: Secondary | ICD-10-CM

## 2020-10-23 DIAGNOSIS — E538 Deficiency of other specified B group vitamins: Secondary | ICD-10-CM | POA: Diagnosis not present

## 2020-10-23 LAB — HEMOGLOBIN A1C: Hgb A1c MFr Bld: 6.9 % — ABNORMAL HIGH (ref 4.6–6.5)

## 2020-10-23 LAB — PSA: PSA: 0.84 ng/mL (ref 0.10–4.00)

## 2020-10-23 LAB — LDL CHOLESTEROL, DIRECT: Direct LDL: 169 mg/dL

## 2020-10-23 LAB — CBC WITH DIFFERENTIAL/PLATELET
Basophils Absolute: 0.1 10*3/uL (ref 0.0–0.1)
Basophils Relative: 0.9 % (ref 0.0–3.0)
Eosinophils Absolute: 0.6 10*3/uL (ref 0.0–0.7)
Eosinophils Relative: 6.8 % — ABNORMAL HIGH (ref 0.0–5.0)
HCT: 38.9 % — ABNORMAL LOW (ref 39.0–52.0)
Hemoglobin: 13.1 g/dL (ref 13.0–17.0)
Lymphocytes Relative: 22.1 % (ref 12.0–46.0)
Lymphs Abs: 1.9 10*3/uL (ref 0.7–4.0)
MCHC: 33.8 g/dL (ref 30.0–36.0)
MCV: 89.7 fl (ref 78.0–100.0)
Monocytes Absolute: 0.7 10*3/uL (ref 0.1–1.0)
Monocytes Relative: 8.8 % (ref 3.0–12.0)
Neutro Abs: 5.1 10*3/uL (ref 1.4–7.7)
Neutrophils Relative %: 61.4 % (ref 43.0–77.0)
Platelets: 229 10*3/uL (ref 150.0–400.0)
RBC: 4.34 Mil/uL (ref 4.22–5.81)
RDW: 13.8 % (ref 11.5–15.5)
WBC: 8.4 10*3/uL (ref 4.0–10.5)

## 2020-10-23 LAB — LIPID PANEL
Cholesterol: 242 mg/dL — ABNORMAL HIGH (ref 0–200)
HDL: 39.4 mg/dL (ref >39.00)
Total CHOL/HDL Ratio: 6
Triglycerides: 410 mg/dL — ABNORMAL HIGH (ref 0.0–149.0)

## 2020-10-23 LAB — URINALYSIS, ROUTINE W REFLEX MICROSCOPIC
Bilirubin Urine: NEGATIVE
Hgb urine dipstick: NEGATIVE
Leukocytes,Ua: NEGATIVE
Nitrite: NEGATIVE
RBC / HPF: NONE SEEN (ref 0–?)
Specific Gravity, Urine: 1.02 (ref 1.000–1.030)
Urine Glucose: >=1000 — AB
Urobilinogen, UA: 0.2 (ref 0.0–1.0)
pH: 5 (ref 5.0–8.0)

## 2020-10-23 LAB — HEPATIC FUNCTION PANEL
ALT: 25 U/L (ref 0–53)
AST: 17 U/L (ref 0–37)
Albumin: 4.2 g/dL (ref 3.5–5.2)
Alkaline Phosphatase: 82 U/L (ref 39–117)
Bilirubin, Direct: 0.1 mg/dL (ref 0.0–0.3)
Total Bilirubin: 0.3 mg/dL (ref 0.2–1.2)
Total Protein: 7 g/dL (ref 6.0–8.3)

## 2020-10-23 LAB — BASIC METABOLIC PANEL
BUN: 42 mg/dL — ABNORMAL HIGH (ref 6–23)
CO2: 24 mEq/L (ref 19–32)
Calcium: 9.7 mg/dL (ref 8.4–10.5)
Chloride: 102 mEq/L (ref 96–112)
Creatinine, Ser: 1.82 mg/dL — ABNORMAL HIGH (ref 0.40–1.50)
GFR: 35.38 mL/min — ABNORMAL LOW (ref >60.00)
Glucose, Bld: 158 mg/dL — ABNORMAL HIGH (ref 70–99)
Potassium: 5.1 mEq/L (ref 3.5–5.1)
Sodium: 137 mEq/L (ref 135–145)

## 2020-10-23 LAB — MICROALBUMIN / CREATININE URINE RATIO
Creatinine,U: 191 mg/dL
Microalb Creat Ratio: 7 mg/g (ref 0.0–30.0)
Microalb, Ur: 13.3 mg/dL — ABNORMAL HIGH (ref 0.0–1.9)

## 2020-10-23 LAB — TSH: TSH: 6.54 u[IU]/mL — ABNORMAL HIGH (ref 0.35–4.50)

## 2020-10-23 LAB — VITAMIN B12: Vitamin B-12: 671 pg/mL (ref 211–911)

## 2020-10-23 LAB — VITAMIN D 25 HYDROXY (VIT D DEFICIENCY, FRACTURES): VITD: 33.93 ng/mL (ref 30.00–100.00)

## 2020-10-24 ENCOUNTER — Encounter: Payer: Self-pay | Admitting: Internal Medicine

## 2020-10-31 ENCOUNTER — Telehealth: Payer: Self-pay | Admitting: Internal Medicine

## 2020-10-31 DIAGNOSIS — G453 Amaurosis fugax: Secondary | ICD-10-CM

## 2020-10-31 DIAGNOSIS — H353132 Nonexudative age-related macular degeneration, bilateral, intermediate dry stage: Secondary | ICD-10-CM | POA: Diagnosis not present

## 2020-10-31 DIAGNOSIS — H5310 Unspecified subjective visual disturbances: Secondary | ICD-10-CM | POA: Diagnosis not present

## 2020-10-31 NOTE — Telephone Encounter (Signed)
Not sure what to say except for OK for ROV unless has already been seen and treated medically since has seen his eye doctor

## 2020-10-31 NOTE — Telephone Encounter (Signed)
Patient was seen by Dr. Ellie Lunch today. Patient was has amaurosis fugax. Patient states he has problems with his cholesterol but is not on any medication. They suspect a possible stroke.   Please advise. Callback phone #: [phone]

## 2020-11-01 NOTE — Telephone Encounter (Signed)
Called to follow up with patient however phone would not ring.

## 2020-11-03 ENCOUNTER — Ambulatory Visit (INDEPENDENT_AMBULATORY_CARE_PROVIDER_SITE_OTHER): Payer: Medicare Other

## 2020-11-03 ENCOUNTER — Other Ambulatory Visit: Payer: Self-pay

## 2020-11-03 ENCOUNTER — Encounter: Payer: Self-pay | Admitting: Podiatrist

## 2020-11-03 ENCOUNTER — Ambulatory Visit: Payer: Medicare Other | Admitting: Podiatrist

## 2020-11-03 DIAGNOSIS — M25572 Pain in left ankle and joints of left foot: Secondary | ICD-10-CM

## 2020-11-03 DIAGNOSIS — S93402A Sprain of unspecified ligament of left ankle, initial encounter: Secondary | ICD-10-CM

## 2020-11-03 DIAGNOSIS — M79672 Pain in left foot: Secondary | ICD-10-CM

## 2020-11-03 NOTE — Telephone Encounter (Signed)
[facility] called again requesting a call back as soon as possible.   Best contact #: [phone] ext. [place]

## 2020-11-03 NOTE — Addendum Note (Signed)
Addended by: Biagio Borg on: 11/03/2020 12:28 PM   Modules accepted: Orders

## 2020-11-03 NOTE — Telephone Encounter (Signed)
Ok this is ordered 

## 2020-11-03 NOTE — Patient Instructions (Signed)
Ankle Sprain  An ankle sprain is a stretch or tear in one of the tough tissues (ligaments) that connect the bones in your ankle. An ankle sprain can happen when the ankle rolls outward (inversion sprain) or inward (eversion sprain). What are the causes? This condition is caused by rolling or twisting the ankle. What increases the risk? You are more likely to develop this condition if you play sports. What are the signs or symptoms? Symptoms of this condition include:  Pain in your ankle.  Swelling.  Bruising. This may happen right after you sprain your ankle or 1-2 days later.  Trouble standing or walking. How is this diagnosed? This condition is diagnosed with:  A physical exam. During the exam, your doctor will press on certain parts of your foot and ankle and try to move them in certain ways.  X-ray imaging. These may be taken to see how bad the sprain is and to check for broken bones. How is this treated? This condition may be treated with:  A brace or splint. This is used to keep the ankle from moving until it heals.  An elastic bandage. This is used to support the ankle.  Crutches.  Pain medicine.  Surgery. This may be needed if the sprain is very bad.  Physical therapy. This may help to improve movement in the ankle. Follow these instructions at home: If you have a brace or a splint:  Wear the brace or splint as told by your doctor. Remove it only as told by your doctor.  Loosen the brace or splint if your toes: ? Tingle. ? Lose feeling (become numb). ? Turn cold and blue.  Keep the brace or splint clean.  If the brace or splint is not waterproof: ? Do not let it get wet. ? Cover it with a watertight covering when you take a bath or a shower. If you have an elastic bandage (dressing):  Remove it to shower or bathe.  Try not to move your ankle much, but wiggle your toes from time to time. This helps to prevent swelling.  Adjust the dressing if it feels  too tight.  Loosen the dressing if your foot: ? Loses feeling. ? Tingles. ? Becomes cold and blue. Managing pain, stiffness, and swelling  Take over-the-counter and prescription medicines only as told by doctor.  For 2-3 days, keep your ankle raised (elevated) above the level of your heart.  If told, put ice on the injured area: ? If you have a removable brace or splint, remove it as told by your doctor. ? Put ice in a plastic bag. ? Place a towel between your skin and the bag. ? Leave the ice on for 20 minutes, 2-3 times a day.   General instructions  Rest your ankle.  Do not use your injured leg to support your body weight until your doctor says that you can. Use crutches as told by your doctor.  Do not use any products that contain nicotine or tobacco, such as cigarettes, e-cigarettes, and chewing tobacco. If you need help quitting, ask your doctor.  Keep all follow-up visits as told by your doctor. Contact a doctor if:  Your bruises or swelling are quickly getting worse.  Your pain does not get better after you take medicine. Get help right away if:  You cannot feel your toes or foot.  Your foot or toes look blue.  You have very bad pain that gets worse. Summary  An ankle sprain is a  stretch or tear in one of the tough tissues (ligaments) that connect the bones in your ankle.  This condition is caused by rolling or twisting the ankle.  Symptoms include pain, swelling, bruising, and trouble walking.  To help with pain and swelling, put ice on the injured ankle, raise your ankle above the level of your heart, and use an elastic bandage. Also, rest as told by your doctor.  Keep all follow-up visits as told by your doctor. This is important. This information is not intended to replace advice given to you by your health care provider. Make sure you discuss any questions you have with your health care provider. Document Revised: 10/14/2017 Document Reviewed:  10/14/2017 Elsevier Patient Education  2021 [employer]  

## 2020-11-03 NOTE — Progress Notes (Signed)
Ankle injury left-  Over a refrigerated box.  Boot.   Chief Complaint  Patient presents with  . Foot Problem    Ankle pain and swelling in dorsal foot and ankle bone. Reports injury sometime in 2021 while walking in the sand and just recently stepped on it wrong. Tx: elevation/ice/boot.      HPI: Patient is 80 y.o. male who presents today for the concerns as listed above.  He relates he was walking into the kitchen and stepped over a soft cooler which caused him to twist his ankle and have pain.  He relates he had a previous ankle injury when walking in the sand in 2021 where his ankle was sprained.  He had a boot from that injury and when he put it on recently stated it took almost all of the  Pain away.  The boot is twisted and he is unable to use it as intended now.   Patient Active Problem List   Diagnosis Date Noted  . Statin myopathy 05/22/2020  . Eczema 11/13/2018  . Rash 11/13/2018  . Great toe pain, right 11/01/2017  . Paronychia of toenail 10/18/2017  . Chronic low back pain 05/02/2017  . Chest pain 10/23/2016  . CKD (chronic kidney disease), stage III ([employer]) 04/19/2016  . BPH (benign prostatic hypertrophy) with urinary obstruction 10/20/2015  . Diabetes ([place]) 10/07/2015  . Impingement syndrome of left shoulder 02/10/2015  . Cough 07/09/2013  . Positional vertigo 06/30/2012  . Expected blood loss anemia 06/17/2012  . Obese 06/17/2012  . S/P left THA, AA 06/16/2012  . Preop exam for internal medicine 06/04/2012  . Peripheral neuropathy 12/21/2011  . Anxiety 12/20/2011  . Left hip pain 04/18/2011  . Degenerative arthritis of hip 04/18/2011  . Arthritis, lumbar spine 04/18/2011  . Hemorrhoids, external 04/18/2011  . Preventative health care 12/23/2010  . Hypothyroidism 10/06/2009  . Hyperlipidemia 10/06/2009  . BIPOLAR DISORDER UNSPECIFIED 10/06/2009  . Essential hypertension 10/06/2009  . GERD 10/06/2009  . COLONIC POLYPS, HX OF 10/06/2009  . NEPHROLITHIASIS, HX OF  10/06/2009  . Obstructive sleep apnea 11/25/2007  . Seasonal and perennial allergic rhinitis 11/25/2007  . Allergic-infective asthma 11/25/2007     Allergies  Allergen Reactions  . Cefuroxime Axetil Anaphylaxis    REACTION: anaphylaxis-ceftin   . Cefprozil     REACTION: unknown  . Cephalosporins Nausea Only  . Crestor [Rosuvastatin Calcium]   . Lipitor [Atorvastatin]     myalgiaas  . Metformin And Related   . Rabeprazole Sodium Nausea Only    REACTION: nausea  . Statins     Review of Systems No fevers, chills, nausea, muscle aches, no difficulty breathing, no calf pain, no chest pain or shortness of breath.   Physical Exam  GENERAL APPEARANCE: Alert, conversant. Appropriately groomed. No acute distress.   VASCULAR: Pedal pulses palpable DP and PT bilateral.  Capillary refill time is immediate to all digits,  Proximal to distal cooling it warm to warm.  Digital perfusion adequate.   NEUROLOGIC:  Light touch is intact bilateral, vibratory sensation intact bilateral  MUSCULOSKELETAL: acceptable muscle strength, tone and stability bilateral.  No gross boney pedal deformities noted. Pain with palpation on the medial ankle is noted as well as the anterior ankle.  No pain laterally noted.  Pain with dorsiflexion, plantarflexion, inversion, and eversion noted.    DERMATOLOGIC: skin is warm, supple, and dry.  No open lesions noted.  No rash, no pre ulcerative lesions. Digital nails are asymptomatic.    Xrays:  xrays of the left foot/ ankle are taken.  No fracture or dislocation is noted.  Pes planus foot type with midfoot arthritis seen.  Plantar heel spur noted.  No acute osseous abnormalities seen   Assessment     ICD-10-CM   1. Mild ankle sprain, left, initial encounter  S93.402A DG Foot Complete Left  2. Left ankle pain, unspecified chronicity  M25.572 DG Ankle 2 Views Left    CANCELED: DG Ankle Complete Left     Plan  Exam and xray findings discussed with the patient  and his wife.  He is happy to have another cam boot and feels comfortable wearing it.  An air fracture walker was dispensed for his use.  He will wear this for the next 2-4 weeks and will wean into an ankle brace as he feels comfortable.  He will be seen back if the injury fails to improve in 3-4 weeks.

## 2020-11-06 ENCOUNTER — Other Ambulatory Visit: Payer: Self-pay | Admitting: Podiatrist

## 2020-11-06 DIAGNOSIS — S93402A Sprain of unspecified ligament of left ankle, initial encounter: Secondary | ICD-10-CM

## 2020-11-07 ENCOUNTER — Other Ambulatory Visit: Payer: Self-pay | Admitting: Internal Medicine

## 2020-11-08 ENCOUNTER — Encounter (HOSPITAL_COMMUNITY): Payer: Self-pay

## 2020-11-08 ENCOUNTER — Inpatient Hospital Stay (HOSPITAL_COMMUNITY): Payer: Medicare Other

## 2020-11-08 ENCOUNTER — Telehealth: Payer: Self-pay

## 2020-11-08 ENCOUNTER — Other Ambulatory Visit: Payer: Self-pay

## 2020-11-08 ENCOUNTER — Inpatient Hospital Stay (HOSPITAL_COMMUNITY)
Admission: EM | Admit: 2020-11-08 | Discharge: 2020-11-10 | DRG: 065 | Disposition: A | Discharge status: Home / Self Care | Payer: Medicare Other | Attending: Internal Medicine | Admitting: Internal Medicine

## 2020-11-08 ENCOUNTER — Emergency Department (HOSPITAL_COMMUNITY): Payer: Medicare Other

## 2020-11-08 ENCOUNTER — Ambulatory Visit (INDEPENDENT_AMBULATORY_CARE_PROVIDER_SITE_OTHER)
Admission: RE | Admit: 2020-11-08 | Discharge: 2020-11-08 | Disposition: A | Discharge status: Home / Self Care | Payer: Medicare Other | Source: Ambulatory Visit | Attending: Internal Medicine | Admitting: Internal Medicine

## 2020-11-08 DIAGNOSIS — N183 Chronic kidney disease, stage 3 unspecified: Secondary | ICD-10-CM | POA: Diagnosis present

## 2020-11-08 DIAGNOSIS — H538 Other visual disturbances: Secondary | ICD-10-CM | POA: Diagnosis not present

## 2020-11-08 DIAGNOSIS — E1169 Type 2 diabetes mellitus with other specified complication: Secondary | ICD-10-CM | POA: Diagnosis not present

## 2020-11-08 DIAGNOSIS — I152 Hypertension secondary to endocrine disorders: Secondary | ICD-10-CM | POA: Diagnosis present

## 2020-11-08 DIAGNOSIS — G453 Amaurosis fugax: Secondary | ICD-10-CM

## 2020-11-08 DIAGNOSIS — Z7982 Long term (current) use of aspirin: Secondary | ICD-10-CM

## 2020-11-08 DIAGNOSIS — N1832 Chronic kidney disease, stage 3b: Secondary | ICD-10-CM | POA: Diagnosis present

## 2020-11-08 DIAGNOSIS — N1831 Chronic kidney disease, stage 3a: Secondary | ICD-10-CM | POA: Diagnosis not present

## 2020-11-08 DIAGNOSIS — R29818 Other symptoms and signs involving the nervous system: Secondary | ICD-10-CM | POA: Diagnosis not present

## 2020-11-08 DIAGNOSIS — I77810 Thoracic aortic ectasia: Secondary | ICD-10-CM | POA: Diagnosis not present

## 2020-11-08 DIAGNOSIS — J45909 Unspecified asthma, uncomplicated: Secondary | ICD-10-CM | POA: Diagnosis not present

## 2020-11-08 DIAGNOSIS — I63232 Cerebral infarction due to unspecified occlusion or stenosis of left carotid arteries: Secondary | ICD-10-CM | POA: Diagnosis not present

## 2020-11-08 DIAGNOSIS — Z833 Family history of diabetes mellitus: Secondary | ICD-10-CM | POA: Diagnosis not present

## 2020-11-08 DIAGNOSIS — N186 End stage renal disease: Secondary | ICD-10-CM | POA: Diagnosis not present

## 2020-11-08 DIAGNOSIS — E785 Hyperlipidemia, unspecified: Secondary | ICD-10-CM | POA: Diagnosis not present

## 2020-11-08 DIAGNOSIS — Z811 Family history of alcohol abuse and dependence: Secondary | ICD-10-CM

## 2020-11-08 DIAGNOSIS — Z808 Family history of malignant neoplasm of other organs or systems: Secondary | ICD-10-CM | POA: Diagnosis not present

## 2020-11-08 DIAGNOSIS — I6529 Occlusion and stenosis of unspecified carotid artery: Secondary | ICD-10-CM | POA: Diagnosis not present

## 2020-11-08 DIAGNOSIS — K219 Gastro-esophageal reflux disease without esophagitis: Secondary | ICD-10-CM | POA: Diagnosis present

## 2020-11-08 DIAGNOSIS — Z803 Family history of malignant neoplasm of breast: Secondary | ICD-10-CM | POA: Diagnosis not present

## 2020-11-08 DIAGNOSIS — Z96642 Presence of left artificial hip joint: Secondary | ICD-10-CM | POA: Diagnosis not present

## 2020-11-08 DIAGNOSIS — E039 Hypothyroidism, unspecified: Secondary | ICD-10-CM | POA: Diagnosis present

## 2020-11-08 DIAGNOSIS — I4510 Unspecified right bundle-branch block: Secondary | ICD-10-CM | POA: Diagnosis not present

## 2020-11-08 DIAGNOSIS — I129 Hypertensive chronic kidney disease with stage 1 through stage 4 chronic kidney disease, or unspecified chronic kidney disease: Secondary | ICD-10-CM | POA: Diagnosis not present

## 2020-11-08 DIAGNOSIS — E1122 Type 2 diabetes mellitus with diabetic chronic kidney disease: Secondary | ICD-10-CM | POA: Diagnosis present

## 2020-11-08 DIAGNOSIS — Z87891 Personal history of nicotine dependence: Secondary | ICD-10-CM

## 2020-11-08 DIAGNOSIS — N182 Chronic kidney disease, stage 2 (mild): Secondary | ICD-10-CM | POA: Diagnosis not present

## 2020-11-08 DIAGNOSIS — E1159 Type 2 diabetes mellitus with other circulatory complications: Secondary | ICD-10-CM | POA: Diagnosis not present

## 2020-11-08 DIAGNOSIS — E1165 Type 2 diabetes mellitus with hyperglycemia: Secondary | ICD-10-CM | POA: Diagnosis not present

## 2020-11-08 DIAGNOSIS — Z79899 Other long term (current) drug therapy: Secondary | ICD-10-CM | POA: Diagnosis not present

## 2020-11-08 DIAGNOSIS — G319 Degenerative disease of nervous system, unspecified: Secondary | ICD-10-CM | POA: Diagnosis not present

## 2020-11-08 DIAGNOSIS — G4733 Obstructive sleep apnea (adult) (pediatric): Secondary | ICD-10-CM | POA: Diagnosis not present

## 2020-11-08 DIAGNOSIS — Z79890 Hormone replacement therapy (postmenopausal): Secondary | ICD-10-CM

## 2020-11-08 DIAGNOSIS — I6522 Occlusion and stenosis of left carotid artery: Secondary | ICD-10-CM

## 2020-11-08 DIAGNOSIS — Z20822 Contact with and (suspected) exposure to covid-19: Secondary | ICD-10-CM | POA: Diagnosis present

## 2020-11-08 DIAGNOSIS — E119 Type 2 diabetes mellitus without complications: Secondary | ICD-10-CM

## 2020-11-08 DIAGNOSIS — I672 Cerebral atherosclerosis: Secondary | ICD-10-CM | POA: Diagnosis not present

## 2020-11-08 DIAGNOSIS — Z7984 Long term (current) use of oral hypoglycemic drugs: Secondary | ICD-10-CM

## 2020-11-08 DIAGNOSIS — R29700 NIHSS score 0: Secondary | ICD-10-CM | POA: Diagnosis present

## 2020-11-08 DIAGNOSIS — Z7902 Long term (current) use of antithrombotics/antiplatelets: Secondary | ICD-10-CM

## 2020-11-08 DIAGNOSIS — I10 Essential (primary) hypertension: Secondary | ICD-10-CM | POA: Diagnosis not present

## 2020-11-08 LAB — COMPREHENSIVE METABOLIC PANEL
ALT: 19 U/L (ref 0–44)
AST: 15 U/L (ref 15–41)
Albumin: 3.5 g/dL (ref 3.5–5.0)
Alkaline Phosphatase: 75 U/L (ref 38–126)
Anion gap: 10 (ref 5–15)
BUN: 47 mg/dL — ABNORMAL HIGH (ref 8–23)
CO2: 23 mmol/L (ref 22–32)
Calcium: 9.4 mg/dL (ref 8.9–10.3)
Chloride: 105 mmol/L (ref 98–111)
Creatinine, Ser: 1.84 mg/dL — ABNORMAL HIGH (ref 0.61–1.24)
GFR, Estimated: 37 mL/min — ABNORMAL LOW (ref >60)
Glucose, Bld: 258 mg/dL — ABNORMAL HIGH (ref 70–99)
Potassium: 5 mmol/L (ref 3.5–5.1)
Sodium: 138 mmol/L (ref 135–145)
Total Bilirubin: 0.5 mg/dL (ref 0.3–1.2)
Total Protein: 7.1 g/dL (ref 6.5–8.1)

## 2020-11-08 LAB — URINALYSIS, ROUTINE W REFLEX MICROSCOPIC
Bilirubin Urine: NEGATIVE
Glucose, UA: >=500 mg/dL — AB
Hgb urine dipstick: NEGATIVE
Ketones, ur: NEGATIVE mg/dL
Leukocytes,Ua: NEGATIVE
Nitrite: NEGATIVE
Protein, ur: NEGATIVE mg/dL
Specific Gravity, Urine: 1.018 (ref 1.005–1.030)
pH: 5 (ref 5.0–8.0)

## 2020-11-08 LAB — DIFFERENTIAL
Abs Immature Granulocytes: 0.02 10*3/uL (ref 0.00–0.07)
Basophils Absolute: 0.1 10*3/uL (ref 0.0–0.1)
Basophils Relative: 1 %
Eosinophils Absolute: 0.4 10*3/uL (ref 0.0–0.5)
Eosinophils Relative: 5 %
Immature Granulocytes: 0 %
Lymphocytes Relative: 24 %
Lymphs Abs: 1.9 10*3/uL (ref 0.7–4.0)
Monocytes Absolute: 0.7 10*3/uL (ref 0.1–1.0)
Monocytes Relative: 8 %
Neutro Abs: 4.8 10*3/uL (ref 1.7–7.7)
Neutrophils Relative %: 62 %

## 2020-11-08 LAB — CBC
HCT: 39.7 % (ref 39.0–52.0)
Hemoglobin: 12.4 g/dL — ABNORMAL LOW (ref 13.0–17.0)
MCH: 30 pg (ref 26.0–34.0)
MCHC: 31.2 g/dL (ref 30.0–36.0)
MCV: 95.9 fL (ref 80.0–100.0)
Platelets: 231 10*3/uL (ref 150–400)
RBC: 4.14 MIL/uL — ABNORMAL LOW (ref 4.22–5.81)
RDW: 12.9 % (ref 11.5–15.5)
WBC: 7.8 10*3/uL (ref 4.0–10.5)
nRBC: 0 % (ref 0.0–0.2)

## 2020-11-08 LAB — ETHANOL: Alcohol, Ethyl (B): <10 mg/dL (ref <10)

## 2020-11-08 LAB — PROTIME-INR
INR: 1 (ref 0.8–1.2)
Prothrombin Time: 13.4 seconds (ref 11.4–15.2)

## 2020-11-08 LAB — LIPID PANEL
Cholesterol: 237 mg/dL — ABNORMAL HIGH (ref 0–200)
HDL: 37 mg/dL — ABNORMAL LOW (ref >40)
LDL Cholesterol: 134 mg/dL — ABNORMAL HIGH (ref 0–99)
Total CHOL/HDL Ratio: 6.4 RATIO
Triglycerides: 328 mg/dL — ABNORMAL HIGH (ref <150)
VLDL: 66 mg/dL — ABNORMAL HIGH (ref 0–40)

## 2020-11-08 LAB — RAPID URINE DRUG SCREEN, HOSP PERFORMED
Amphetamines: NOT DETECTED
Barbiturates: NOT DETECTED
Benzodiazepines: NOT DETECTED
Cocaine: NOT DETECTED
Opiates: NOT DETECTED
Tetrahydrocannabinol: NOT DETECTED

## 2020-11-08 LAB — CBG MONITORING, ED: Glucose-Capillary: 139 mg/dL — ABNORMAL HIGH (ref 70–99)

## 2020-11-08 LAB — APTT: aPTT: 31 seconds (ref 24–36)

## 2020-11-08 MED ORDER — SENNOSIDES-DOCUSATE SODIUM 8.6-50 MG PO TABS: 1.0000 | ORAL_TABLET | Freq: Every evening | ORAL | Status: DC | PRN

## 2020-11-08 MED ORDER — ACETAMINOPHEN 325 MG PO TABS: 650.0000 mg | ORAL_TABLET | ORAL | Status: DC | PRN

## 2020-11-08 MED ORDER — PANTOPRAZOLE SODIUM 40 MG PO TBEC
40.0000 mg | DELAYED_RELEASE_TABLET | Freq: Every day | ORAL | Status: DC
  Administered 2020-11-09 – 2020-11-10 (×2): 40 mg via ORAL
  Filled 2020-11-08 (×2): qty 1

## 2020-11-08 MED ORDER — CARBAMAZEPINE 200 MG PO TABS
400.0000 mg | ORAL_TABLET | Freq: Every day | ORAL | Status: DC
  Administered 2020-11-08 – 2020-11-09 (×2): 400 mg via ORAL
  Filled 2020-11-08 (×3): qty 2

## 2020-11-08 MED ORDER — ACETAMINOPHEN 160 MG/5ML PO SOLN: 650.0000 mg | ORAL | Status: DC | PRN

## 2020-11-08 MED ORDER — EZETIMIBE 10 MG PO TABS
10.0000 mg | ORAL_TABLET | Freq: Every day | ORAL | Status: DC
  Administered 2020-11-09 – 2020-11-10 (×2): 10 mg via ORAL
  Filled 2020-11-08 (×4): qty 1

## 2020-11-08 MED ORDER — CLOPIDOGREL BISULFATE 75 MG PO TABS
75.0000 mg | ORAL_TABLET | Freq: Every day | ORAL | Status: DC
  Administered 2020-11-09 – 2020-11-10 (×2): 75 mg via ORAL
  Filled 2020-11-08 (×2): qty 1

## 2020-11-08 MED ORDER — ASPIRIN 81 MG PO CHEW
81.0000 mg | CHEWABLE_TABLET | Freq: Every day | ORAL | Status: DC
  Administered 2020-11-09 – 2020-11-10 (×2): 81 mg via ORAL
  Filled 2020-11-08 (×2): qty 1

## 2020-11-08 MED ORDER — ACETAMINOPHEN 650 MG RE SUPP: 650.0000 mg | RECTAL | Status: DC | PRN

## 2020-11-08 MED ORDER — STROKE: EARLY STAGES OF RECOVERY BOOK
Freq: Once | Status: AC
  Filled 2020-11-08: qty 1

## 2020-11-08 MED ORDER — ASPIRIN 325 MG PO TABS
ORAL_TABLET | ORAL | Status: AC
  Filled 2020-11-08: qty 1

## 2020-11-08 MED ORDER — TAMSULOSIN HCL 0.4 MG PO CAPS
0.4000 mg | ORAL_CAPSULE | Freq: Every day | ORAL | Status: DC
  Administered 2020-11-09 – 2020-11-10 (×2): 0.4 mg via ORAL
  Filled 2020-11-08 (×2): qty 1

## 2020-11-08 MED ORDER — HEPARIN SODIUM (PORCINE) 5000 UNIT/ML IJ SOLN
5000.0000 [IU] | Freq: Three times a day (TID) | INTRAMUSCULAR | Status: DC
  Administered 2020-11-08 – 2020-11-10 (×5): 5000 [IU] via SUBCUTANEOUS
  Filled 2020-11-08 (×6): qty 1

## 2020-11-08 MED ORDER — ASPIRIN 325 MG PO TABS
325.0000 mg | ORAL_TABLET | Freq: Once | ORAL | Status: AC
  Administered 2020-11-08: 325 mg via ORAL

## 2020-11-08 MED ORDER — INSULIN ASPART 100 UNIT/ML IJ SOLN
0.0000 [IU] | Freq: Three times a day (TID) | INTRAMUSCULAR | Status: DC
  Administered 2020-11-09 (×2): 3 [IU] via SUBCUTANEOUS
  Administered 2020-11-09 – 2020-11-10 (×2): 2 [IU] via SUBCUTANEOUS

## 2020-11-08 MED ORDER — LEVOTHYROXINE SODIUM 112 MCG PO TABS
112.0000 ug | ORAL_TABLET | Freq: Every day | ORAL | Status: DC
  Administered 2020-11-09 – 2020-11-10 (×2): 112 ug via ORAL
  Filled 2020-11-08 (×2): qty 1

## 2020-11-08 MED ORDER — CLOPIDOGREL BISULFATE 300 MG PO TABS
300.0000 mg | ORAL_TABLET | Freq: Once | ORAL | Status: AC
  Administered 2020-11-08: 300 mg via ORAL
  Filled 2020-11-08: qty 1

## 2020-11-08 MED ORDER — ALBUTEROL SULFATE (2.5 MG/3ML) 0.083% IN NEBU: 3.0000 mL | INHALATION_SOLUTION | Freq: Four times a day (QID) | RESPIRATORY_TRACT | Status: DC | PRN

## 2020-11-08 MED ORDER — INSULIN ASPART 100 UNIT/ML IJ SOLN: 0.0000 [IU] | Freq: Every day | INTRAMUSCULAR | Status: DC

## 2020-11-08 NOTE — Telephone Encounter (Signed)
Left ICA is severe stenosis 80-99%.  Per PCP, based on recent amaurosis fugax, recommendation from opthamology & above findings, MD advises pt to go to ED.  Sharee Pimple at [facility] made aware of order & states she will let pt know.

## 2020-11-08 NOTE — ED Notes (Signed)
Received verbal report from Burke C at this time

## 2020-11-08 NOTE — H&P (Signed)
History and Physical    Joseph Villa DOB: 08/02/1940 DOA: 11/08/2020  PCP: Biagio Borg, MD  Patient coming from: Home  I have personally briefly reviewed patient's old medical records in [place]  Chief Complaint: Transient vision change, carotid artery stenosis  HPI: Joseph Villa is a 80 y.o. male with medical history significant for T2DM, HTN, HLD, CKD stage IIIb, hypothyroidism, RBBB, and OSA on CPAP who presents to the ED for evaluation of transient left vision change and left-sided carotid artery stenosis.  Patient reported transient blurriness to the medial half of the left visual field on 10/31/20 which occurred after he walked up 4 stairs and turned to the left into the house.  This lasted about 10-15 minutes before resolving on its own and he has not experienced recurrent symptoms since then.  He saw his ophthalmologist, Dr. Ellie Lunch, who was concerned about left-sided carotid artery blockage as the cause of his symptoms.  Carotid Dopplers were ordered and completed earlier today (6/8).  Results were significant for left ICA stenosis 80-99%.  Right ICA stenosis 40-59% also noted.  Patient was advised to present to the ED for further evaluation.  Patient denies any similar episodes in the past.  He reports 1 episode of sharp central sternal chest pain occurring while at rest 1 month ago which resolved on its own after a few seconds.  He has not had any further chest pain since then.  He denies any shortness of breath, cough, nausea, vomiting, abdominal pain, dysuria, focal weakness, numbness/tingling.  He is not currently taking any aspirin or other antiplatelets.  He reports that history of intolerance to statin which caused myalgias.  ED Course:  Initial vitals show BP 141/73, pulse 71, RR 16, temp 98.2 F, SPO2 97% on room air.  Labs show WBC 7.8, hemoglobin 12.4, platelets 231,000, sodium 138, potassium 5.0, bicarb 23, BUN 47, creatinine 1.84, serum  glucose 258, LFTs within normal limits, serum ethanol undetectable, urinalysis negative for UTI.  Neurology were consulted and recommended admission for TIA/stroke work-up.  CT head without contrast was negative for acute finding.  Mild age-related volume loss and mild small vessel changes of the hemispheric white matter noted.  MRI brain without contrast, MRA head and neck without contrast were ordered and pending.  Patient was given aspirin 325 mg to be followed by 81 mg daily.  The hospitalist service was consulted to admit for further evaluation and management.  Review of Systems: All systems reviewed and are negative except as documented in history of present illness above.   Past Medical History:  Diagnosis Date  . ALLERGIC ASTHMA 11/25/2007  . ALLERGIC RHINITIS 11/25/2007  . Allergy   . Arthritis, lumbar spine 04/18/2011  . BIPOLAR DISORDER UNSPECIFIED 10/06/2009  . Blood transfusion without reported diagnosis    s/p goiter surgery age 7   . COLONIC POLYPS, HX OF 10/06/2009  . Degenerative arthritis of hip 04/18/2011  . DJD (degenerative joint disease)   . DM w/o Complication Type II 03/04/1116  . GERD 10/06/2009  . Hemorrhage yrs ago   after goiter surgery, tracheostomy inserted and later removed  . Hemorrhoid   . HYPERLIPIDEMIA 10/06/2009  . HYPERTENSION, BENIGN 07/27/2009  . HYPOTHYROIDISM 10/06/2009  . NEPHROLITHIASIS, HX OF 10/06/2009  . OBSTRUCTIVE SLEEP APNEA 11/25/2007   cpap setting of 9  . Peptic stricture of esophagus   . RBBB (right bundle branch block)   . Sleep apnea    wears c pap  Past Surgical History:  Procedure Laterality Date  . COLONOSCOPY    . GANGLION CYST EXCISION     2-3 cysts removed  . thyroid Goiter surgery    . TONSILLECTOMY    . TOTAL HIP ARTHROPLASTY  06/16/2012   Procedure: TOTAL HIP ARTHROPLASTY ANTERIOR APPROACH;  Surgeon: Mauri Pole, MD;  Location: [facility];  Service: Orthopedics;  Laterality: Left;  . UMBILICAL HERNIA REPAIR  several  yrs ago  . UPPER GASTROINTESTINAL ENDOSCOPY      Social History:  reports that he quit smoking about 43 years ago. His smoking use included cigarettes and pipe. He has a 22.50 pack-year smoking history. He has never used smokeless tobacco. He reports that he does not drink alcohol and does not use drugs.  Allergies  Allergen Reactions  . Cefuroxime Axetil Anaphylaxis    REACTION: anaphylaxis-ceftin   . Cefprozil     REACTION: unknown  . Cephalosporins Nausea Only  . Crestor [Rosuvastatin Calcium]   . Lipitor [Atorvastatin]     myalgiaas  . Metformin And Related   . Rabeprazole Sodium Nausea Only    REACTION: nausea  . Statins     Family History  Problem Relation Age of Onset  . Throat cancer Mother   . Alcohol abuse Brother   . Diabetes Brother   . Drug abuse Daughter   . Alcoholism Other        uncle  . Breast cancer Father   . Colon cancer Neg Hx   . Colon polyps Neg Hx   . Esophageal cancer Neg Hx   . Rectal cancer Neg Hx   . Stomach cancer Neg Hx      Prior to Admission medications   Medication Sig Start Date End Date Taking? Authorizing Provider  ACCU-CHEK AVIVA PLUS test strip CHECK BLOOD GLUCOSE (SUGAR) TWICE DAILY 03/27/20   Renato Shin, MD  Accu-Chek FastClix Lancets MISC USE TWICE DAILY 03/27/20   Renato Shin, MD  ADVAIR DISKUS 250-50 MCG/DOSE AEPB INHALE 1 PUFF AND RINSE MOUTH TWICE DAILY. MAINTENANCE INHALER 03/27/15   Biagio Borg, MD  aspirin 81 MG chewable tablet Chew 81 mg by mouth daily.    [provider]  Blood Glucose Monitoring Suppl (ACCU-CHEK AVIVA PLUS) w/Device KIT AS DIRECTED 03/28/20   Renato Shin, MD  bromocriptine (PARLODEL) 2.5 MG tablet TAKE ONE-HALF TABLET DAILY 02/14/20   Renato Shin, MD  carbamazepine (TEGRETOL) 200 MG tablet TAKE 2 TABLETS AT BEDTIME 11/07/20   Biagio Borg, MD  Cholecalciferol (VITAMIN D3) 2000 UNITS TABS Take 2,000 Units by mouth every morning.     [provider]  clobetasol cream  (TEMOVATE) 1.61 % Apply 1 application topically 2 (two) times daily as needed. For eczema. 05/08/18   Biagio Borg, MD  Coenzyme Q10 (CO Q 10) 100 MG CAPS Take 200 mg by mouth daily.    [provider]  dapagliflozin propanediol (FARXIGA) 5 MG TABS tablet Take 1 tablet (5 mg total) by mouth daily before breakfast. 05/22/20   Biagio Borg, MD  desonide (DESOWEN) 0.05 % cream Apply 1 application topically 2 (two) times daily.    [provider]  desoximetasone (TOPICORT) 0.25 % cream Apply 1 application topically 2 (two) times daily. 11/13/18   Biagio Borg, MD  fenofibrate (TRICOR) 145 MG tablet Take 1 tablet (145 mg total) by mouth daily. 05/08/18   Biagio Borg, MD  fluticasone (FLONASE) 50 MCG/ACT nasal spray ONE SPRAY INTO NOSE EVERY DAY 01/12/18  Biagio Borg, MD  glucosamine-chondroitin 500-400 MG tablet Take 1 tablet by mouth every morning.    [provider]  glucose blood (ACCU-CHEK AVIVA PLUS) test strip Used to check blood sugars twice a day Dx E11.9 07/24/16   Biagio Borg, MD  glucose blood test strip Use to check blood sugar 2 times per day. DX Code: E11.9 07/12/15   Renato Shin, MD  hydrochlorothiazide (HYDRODIURIL) 25 MG tablet TAKE ONE TABLET DAILY 06/20/20   Biagio Borg, MD  hydrocortisone 2.5 % lotion As directed 10/25/14   [provider]  JANUVIA 100 MG tablet TAKE ONE-HALF TABLET DAILY 10/11/20   Biagio Borg, MD  ketoconazole (NIZORAL) 2 % cream Apply 1 application topically daily. 11/13/18   Biagio Borg, MD  Lancets Misc. (ACCU-CHEK FASTCLIX LANCET) KIT AS DIRECTED 03/05/16   Biagio Borg, MD  levothyroxine (SYNTHROID) 112 MCG tablet TAKE ONE TABLET EACH DAY 03/08/20   Biagio Borg, MD  Multiple Vitamins-Minerals (ICAPS AREDS FORMULA PO) Take 1 capsule by mouth daily.    [provider]  Na Sulfate-K Sulfate-Mg Sulf SOLN Take 1 kit by mouth once. 06/01/15   Irene Shipper, MD  nateglinide (STARLIX) 60 MG tablet TAKE ONE-HALF  TABLET 3 TIMES DAILY WITH MEALS 08/04/20   Biagio Borg, MD  olmesartan [facility]) 40 MG tablet TAKE ONE TABLET DAILY 06/20/20   Biagio Borg, MD  Omega-3 Fatty Acids (FISH OIL) 1200 MG CAPS Take 1 capsule by mouth daily.    [provider]  OVER THE COUNTER MEDICATION Take 1 tablet by mouth daily. Tumeric    [provider]  pantoprazole (PROTONIX) 40 MG tablet TAKE ONE TABLET DAILY 10/03/20   Biagio Borg, MD  PROAIR HFA [phone] Base) MCG/ACT inhaler 2 PUFFS FOUR TIMES DAILY AS NEEDED 05/25/18   Biagio Borg, MD  PROCTOSOL HC 2.5 % rectal cream APPLY TWICE DAILY RECTALLY 10/19/20   Biagio Borg, MD  sulfamethoxazole-trimethoprim (BACTRIM,SEPTRA) 400-80 MG tablet Take 1 tablet by mouth 2 (two) times daily.    [provider]  tamsulosin (FLOMAX) 0.4 MG CAPS capsule TAKE ONE CAPSULE EACH DAY 06/30/20   Biagio Borg, MD  traMADol Veatrice Bourbon) 50 MG tablet TAKE ONE TABLET EVERY EIGHT HOURS AS NEEDED 11/25/17   Biagio Borg, MD    Physical Exam: Vitals:   11/08/20 1745 11/08/20 1800 11/08/20 1815 11/08/20 1830  BP: (!) 160/70 (!) 157/72 (!) 154/74 (!) 152/71  Pulse: 71 69 71 72  Resp: (!) '23 17 17 ' (!) 21  Temp:      TempSrc:      SpO2: 97% 96% 96% 99%   Constitutional: Resting supine in bed, NAD, calm, comfortable Eyes: PERRL, EOMI, lids and conjunctivae normal ENMT: Mucous membranes are moist. Posterior pharynx clear of any exudate or lesions.Normal dentition.  Neck: normal, supple, no masses. Respiratory: clear to auscultation bilaterally, no wheezing, no crackles. Normal respiratory effort. No accessory muscle use.  Cardiovascular: Regular rate and rhythm, no murmurs / rubs / gallops. No extremity edema. 2+ pedal pulses. Abdomen: no tenderness, no masses palpated. No hepatosplenomegaly. Bowel sounds positive.  Musculoskeletal: no clubbing / cyanosis. No joint deformity upper and lower extremities. Good ROM, no contractures. Normal muscle tone.  Skin: no rashes,  lesions, ulcers. No induration Neurologic: CN 2-12 grossly intact. Sensation intact. Strength 5/5 in all 4.  Psychiatric: Normal judgment and insight. Alert and oriented x 3. Normal mood.   Labs on  Admission: I have personally reviewed following labs and imaging studies  CBC: Recent Labs  Lab 11/08/20 1617  WBC 7.8  NEUTROABS 4.8  HGB 12.4*  HCT 39.7  MCV 95.9  PLT 025   Basic Metabolic Panel: Recent Labs  Lab 11/08/20 1617  NA 138  K 5.0  CL 105  CO2 23  GLUCOSE 258*  BUN 47*  CREATININE 1.84*  CALCIUM 9.4   GFR: CrCl cannot be calculated (Unknown ideal weight.). Liver Function Tests: Recent Labs  Lab 11/08/20 1617  AST 15  ALT 19  ALKPHOS 75  BILITOT 0.5  PROT 7.1  ALBUMIN 3.5   No results for input(s): LIPASE, AMYLASE in the last 168 hours. No results for input(s): AMMONIA in the last 168 hours. Coagulation Profile: Recent Labs  Lab 11/08/20 1617  INR 1.0   Cardiac Enzymes: No results for input(s): CKTOTAL, CKMB, CKMBINDEX, TROPONINI in the last 168 hours. BNP (last 3 results) No results for input(s): PROBNP in the last 8760 hours. HbA1C: No results for input(s): HGBA1C in the last 72 hours. CBG: No results for input(s): GLUCAP in the last 168 hours. Lipid Profile: No results for input(s): CHOL, HDL, LDLCALC, TRIG, CHOLHDL, LDLDIRECT in the last 72 hours. Thyroid Function Tests: No results for input(s): TSH, T4TOTAL, FREET4, T3FREE, THYROIDAB in the last 72 hours. Anemia Panel: No results for input(s): VITAMINB12, FOLATE, FERRITIN, TIBC, IRON, RETICCTPCT in the last 72 hours. Urine analysis:    Component Value Date/Time   COLORURINE YELLOW 11/08/2020 1617   APPEARANCEUR CLEAR 11/08/2020 1617   LABSPEC 1.018 11/08/2020 1617   PHURINE 5.0 11/08/2020 1617   GLUCOSEU >=500 (A) 11/08/2020 1617   GLUCOSEU >=1000 (A) 10/23/2020 0937   HGBUR NEGATIVE 11/08/2020 1617   BILIRUBINUR NEGATIVE 11/08/2020 1617   KETONESUR NEGATIVE 11/08/2020 1617    PROTEINUR NEGATIVE 11/08/2020 1617   UROBILINOGEN 0.2 10/23/2020 0937   NITRITE NEGATIVE 11/08/2020 1617   LEUKOCYTESUR NEGATIVE 11/08/2020 1617    Radiological Exams on Admission: CT HEAD WO CONTRAST  Result Date: 11/08/2020 CLINICAL DATA:  Neurological deficit.  Acute stroke suspected. EXAM: CT HEAD WITHOUT CONTRAST TECHNIQUE: Contiguous axial images were obtained from the base of the skull through the vertex without intravenous contrast. COMPARISON:  None. FINDINGS: Brain: Age related volume loss. No focal abnormality affects the brainstem or cerebellum. Cerebral hemispheres show mild chronic small-vessel changes of the white matter. No cortical or large vessel territory infarction. No mass lesion, hemorrhage, hydrocephalus or extra-axial collection. Vascular: There is atherosclerotic calcification of the major vessels at the base of the brain. Skull: Negative Sinuses/Orbits: Clear/normal Other: None IMPRESSION: No acute CT finding. Mild age related volume loss and mild small vessel change of the hemispheric white matter. Electronically Signed   By: Nelson Chimes M.D.   On: 11/08/2020 18:50   VAS US CAROTID  Result Date: 11/08/2020 Carotid Arterial Duplex Study Patient Name:  Joseph Villa  Date of Exam:   11/08/2020 Medical Rec #: [id number]         Accession #:    [id number] Date of Birth: [birth year]         Patient Gender: M Patient Age:   79Y Exam Location:  [facility] Procedure:      VAS US CAROTID Referring Phys: [place] --------------------------------------------------------------------------------  Indications:       Carotid artery disease. Risk Factors:      Hypertension, hyperlipidemia, Diabetes. Other Factors:     Amaurosis fugax- visual disturbance of left  eye peripheral                    field lasting 15 min. Comparison Study:  none Performing Technologist: June Leap RDMS, RVT  Examination Guidelines: A complete evaluation includes B-mode imaging, spectral  Doppler, color Doppler, and power Doppler as needed of all accessible portions of each vessel. Bilateral testing is considered an integral part of a complete examination. Limited examinations for reoccurring indications may be performed as noted.  Right Carotid Findings: +----------+-------+-------+--------+------------------------+-----------------+           PSV    EDV    StenosisPlaque Description      Comments                    cm/s   cm/s                                                     +----------+-------+-------+--------+------------------------+-----------------+ CCA Prox  85     7                                                        +----------+-------+-------+--------+------------------------+-----------------+ CCA Distal64     12                                                       +----------+-------+-------+--------+------------------------+-----------------+ ICA Prox  210    56     40-59%  heterogenous and        upper end of                                      irregular               range             +----------+-------+-------+--------+------------------------+-----------------+ ICA Mid   212    37                                                       +----------+-------+-------+--------+------------------------+-----------------+ ICA Distal205    36                                                       +----------+-------+-------+--------+------------------------+-----------------+ ECA       117    15             heterogenous                              +----------+-------+-------+--------+------------------------+-----------------+ +----------+--------+-------+----------------+-------------------+           PSV  cm/sEDV cmsDescribe        Arm Pressure (mmHG) +----------+--------+-------+----------------+-------------------+ [id number]            Multiphasic, WNL                     +----------+--------+-------+----------------+-------------------+ +---------+--------+--+--------+--+---------+ VertebralPSV cm/s57EDV cm/s22Antegrade +---------+--------+--+--------+--+---------+  Left Carotid Findings: +----------+--------+--------+--------+--------------------------+---------+           PSV cm/sEDV cm/sStenosisPlaque Description        Comments  +----------+--------+--------+--------+--------------------------+---------+ CCA Prox  120     27                                                  +----------+--------+--------+--------+--------------------------+---------+ CCA Distal66      17                                                  +----------+--------+--------+--------+--------------------------+---------+ ICA Prox  76      14              heterogenous and calcific Shadowing +----------+--------+--------+--------+--------------------------+---------+ ICA Mid   358     158     80-99%  heterogenous and irregular          +----------+--------+--------+--------+--------------------------+---------+ ICA Distal140     40                                                  +----------+--------+--------+--------+--------------------------+---------+ ECA       123     16              heterogenous                        +----------+--------+--------+--------+--------------------------+---------+ +----------+--------+--------+----------------+-------------------+           PSV cm/sEDV cm/sDescribe        Arm Pressure (mmHG) +----------+--------+--------+----------------+-------------------+ Subclavian160             Multiphasic, WNL                    +----------+--------+--------+----------------+-------------------+ +---------+--------+--+--------+--+---------+ VertebralPSV cm/s70EDV cm/s12Antegrade +---------+--------+--+--------+--+---------+    Findings reported to [place] at 1:25pm. Summary: Right Carotid: Velocities in the  right ICA are consistent with a 40-59%                stenosis. Left Carotid: Velocities in the left ICA are consistent with a 80-99% stenosis.  *See table(s) above for measurements and observations.  Electronically signed by Deitra Mayo MD on 11/08/2020 at 1:29:33 PM.    Final     EKG: Personally reviewed. Normal sinus rhythm, RBBB.  Similar to prior.  Assessment/Plan Principal Problem:   Amaurosis fugax of left eye Active Problems:   Hypothyroidism   Obstructive sleep apnea   Hypertension associated with diabetes ([place])   Type 2 diabetes mellitus (HCC)   CKD (chronic kidney disease), stage III (HCC)   Hyperlipidemia associated with type 2 diabetes mellitus ([place])   Joseph Villa is a 80 y.o. male with medical history significant for T2DM, HTN, HLD, CKD stage IIIb, hypothyroidism, RBBB, and OSA on  CPAP who is admitted for TIA/stroke work-up for amaurosis fugax of left eye with left ICA stenosis.  Acute ischemic infarcts of the left cerebral hemisphere and amaurosis fugax of left eye due to left ICA stenosis: Patient with transient left vision change on 5/31.  Carotid Dopplers 6/8 showing 80-99% left ICA stenosis and 40-59% right ICA stenosis.  Neurology recommending full stroke work-up. -MRI brain shows 3 subcentimeter acute ischemic infarcts involving the cortical and subcortical aspect of the left cerebral hemisphere -MRA head negative for intracranial large vessel occlusion.  Intracranial atherosclerotic disease with associated severe stenosis involving the left V4 segment, left STA, and right A1 segment noted. -MRA neck shows eccentric atheromatous plaque about the proximal ICAs with stenosis up to 75% on the left and 65% on the right -Obtain echocardiogram -Given aspirin 325 mg once and Plavix 300 mg once -Aspirin 81 mg daily and Plavix 75 mg daily beginning tomorrow -Started on Zetia due to prior statin intolerance -PT/OT/SLP eval -Monitor on telemetry, continue  neurochecks -Allow permissive hypertension up to 220/110 for 24 hours -We will need to consult neuro IR in a.m. for potential intervention  Type 2 diabetes: Holding home Portal, Januvia, nateglinide, bromocriptine.  Place on sliding scale insulin and adjust as needed.  Hypertension: Holding home antihypertensives to allow for permissive hypertension.  CKD stage IIIb: Chronic and stable.  Continue to monitor.  Hypothyroidism: TSH 6.54 on 10/23/2020.  Continue Synthroid.  Hyperlipidemia: Total cholesterol 237, triglycerides 328, LDL 134, HDL 37.  Started on Zetia due to reported prior statin intolerance (myalgias).  Asthma: Stable, continue albuterol as needed.  Mood disorder: History of bipolar disorder per previous PCP notes. -Continue Tegretol  OSA: Continue CPAP nightly.  DVT prophylaxis: Subcutaneous heparin Code Status: Full code, confirmed with patient Family Communication: Discussed with patient spouse at bedside Disposition Plan: From home, dispo pending further work-up and management of acute CVA and carotid artery stenosis Consults called: Neurology Level of care: Telemetry Medical Admission status:  Status is: Inpatient  Remains inpatient appropriate because:Ongoing diagnostic testing needed not appropriate for outpatient work up   Dispo: The patient is from: Home              Anticipated d/c is to: Home              Patient currently is not medically stable to d/c.   Difficult to place patient No  Zada Finders MD [facility]  If 7PM-7AM, please contact night-coverage [email or web address]  11/08/2020, 6:56 PM

## 2020-11-08 NOTE — ED Triage Notes (Signed)
Patient sent from MD office for carotid blockage on the left after doppler study today. Patient had some visual changes on Tuesday and had test today. Patient with no complaints

## 2020-11-08 NOTE — ED Notes (Signed)
EDP at [facility] 

## 2020-11-08 NOTE — Progress Notes (Signed)
Carotid duplex has been completed. Preliminary results can be found under CV proc through chart review.  Critical results called to Dr. Gwynn Burly office. Spoke with [place]. Patient instructed to go to ED for evaluation based on symptomatic disease. Patient had episode of Amaurosis Fugax.     June Leap, BS, RDMS, RVT

## 2020-11-08 NOTE — ED Notes (Signed)
Remains in MRI at this time 

## 2020-11-08 NOTE — ED Notes (Signed)
Not present at this time.  

## 2020-11-08 NOTE — ED Notes (Signed)
Wife at [facility]. Pt to MRI at this time. No changes. Alert, NAD, calm, interactive, speech clear.

## 2020-11-08 NOTE — ED Provider Notes (Addendum)
[facility] EMERGENCY DEPARTMENT Provider Note   CSN: [id number] Arrival date & time: 11/08/20  1523     History No chief complaint on file.   Joseph Villa is a 80 y.o. male with a past medical history below who presents for evaluation of stenosis of his left ICA after carotid US today. Patient notes on 5/31 while he was taking out the trash and while walking back into the house noticed blurred vision in the periphery of his left eye. Symptoms lastedd for about 10 minutes before resolving spontaneously. Subsequently went to see his ophthalmologist Dr. Ellie Lunch with concern for possible stroke. He reports he had his eyes dealated and no abnormalites were found during the visit. Had carotid US  today and was noted to have 80-99% of the left ICA and sent to the ED. Patient otherwise is feeling well. Has not had further visual symptoms. Denies any fevers, chills, chest pain, n/v/d, jaw pain, pain with eye movements, weakness, numbness, tingling, headache, or syncope. Not on any blood thinners or antiplatelet agents.    Past Medical History:  Diagnosis Date  . ALLERGIC ASTHMA 11/25/2007  . ALLERGIC RHINITIS 11/25/2007  . Allergy   . Arthritis, lumbar spine 04/18/2011  . BIPOLAR DISORDER UNSPECIFIED 10/06/2009  . Blood transfusion without reported diagnosis    s/p goiter surgery age 69   . COLONIC POLYPS, HX OF 10/06/2009  . Degenerative arthritis of hip 04/18/2011  . DJD (degenerative joint disease)   . DM w/o Complication Type II 94/06/7406  . GERD 10/06/2009  . Hemorrhage yrs ago   after goiter surgery, tracheostomy inserted and later removed  . Hemorrhoid   . HYPERLIPIDEMIA 10/06/2009  . HYPERTENSION, BENIGN 07/27/2009  . HYPOTHYROIDISM 10/06/2009  . NEPHROLITHIASIS, HX OF 10/06/2009  . OBSTRUCTIVE SLEEP APNEA 11/25/2007   cpap setting of 9  . Peptic stricture of esophagus   . RBBB (right bundle branch block)   . Sleep apnea    wears c pap    Patient Active Problem List    Diagnosis Date Noted  . Statin myopathy 05/22/2020  . Eczema 11/13/2018  . Rash 11/13/2018  . Great toe pain, right 11/01/2017  . Paronychia of toenail 10/18/2017  . Chronic low back pain 05/02/2017  . Chest pain 10/23/2016  . CKD (chronic kidney disease), stage III ([place]) 04/19/2016  . BPH (benign prostatic hypertrophy) with urinary obstruction 10/20/2015  . Diabetes ([place]) 10/07/2015  . Impingement syndrome of left shoulder 02/10/2015  . Cough 07/09/2013  . Positional vertigo 06/30/2012  . Expected blood loss anemia 06/17/2012  . Obese 06/17/2012  . S/P left THA, AA 06/16/2012  . Preop exam for internal medicine 06/04/2012  . Peripheral neuropathy 12/21/2011  . Anxiety 12/20/2011  . Left hip pain 04/18/2011  . Degenerative arthritis of hip 04/18/2011  . Arthritis, lumbar spine 04/18/2011  . Hemorrhoids, external 04/18/2011  . Preventative health care 12/23/2010  . Hypothyroidism 10/06/2009  . Hyperlipidemia 10/06/2009  . BIPOLAR DISORDER UNSPECIFIED 10/06/2009  . Essential hypertension 10/06/2009  . GERD 10/06/2009  . COLONIC POLYPS, HX OF 10/06/2009  . NEPHROLITHIASIS, HX OF 10/06/2009  . Obstructive sleep apnea 11/25/2007  . Seasonal and perennial allergic rhinitis 11/25/2007  . Allergic-infective asthma 11/25/2007    Past Surgical History:  Procedure Laterality Date  . COLONOSCOPY    . GANGLION CYST EXCISION     2-3 cysts removed  . thyroid Goiter surgery    . TONSILLECTOMY    . TOTAL HIP ARTHROPLASTY  06/16/2012  Procedure: TOTAL HIP ARTHROPLASTY ANTERIOR APPROACH;  Surgeon: Mauri Pole, MD;  Location: [facility];  Service: Orthopedics;  Laterality: Left;  . UMBILICAL HERNIA REPAIR  several yrs ago  . UPPER GASTROINTESTINAL ENDOSCOPY        Family History  Problem Relation Age of Onset  . Throat cancer Mother   . Alcohol abuse Brother   . Diabetes Brother   . Drug abuse Daughter   . Alcoholism Other        uncle  . Breast cancer Father   . Colon cancer  Neg Hx   . Colon polyps Neg Hx   . Esophageal cancer Neg Hx   . Rectal cancer Neg Hx   . Stomach cancer Neg Hx     Social History   Tobacco Use  . Smoking status: Former Smoker    Packs/day: 1.50    Years: 15.00    Pack years: 22.50    Types: Cigarettes, Pipe    Quit date: 06/23/1977    Years since quitting: 43.4  . Smokeless tobacco: Never Used  Substance Use Topics  . Alcohol use: No    Alcohol/week: 0.0 standard drinks  . Drug use: No    Home Medications Prior to Admission medications   Medication Sig Start Date End Date Taking? Authorizing Provider  ACCU-CHEK AVIVA PLUS test strip CHECK BLOOD GLUCOSE (SUGAR) TWICE DAILY 03/27/20   Renato Shin, MD  Accu-Chek FastClix Lancets MISC USE TWICE DAILY 03/27/20   Renato Shin, MD  ADVAIR DISKUS 250-50 MCG/DOSE AEPB INHALE 1 PUFF AND RINSE MOUTH TWICE DAILY. MAINTENANCE INHALER 03/27/15   Biagio Borg, MD  aspirin 81 MG chewable tablet Chew 81 mg by mouth daily.    [provider]  Blood Glucose Monitoring Suppl (ACCU-CHEK AVIVA PLUS) w/Device KIT AS DIRECTED 03/28/20   Renato Shin, MD  bromocriptine (PARLODEL) 2.5 MG tablet TAKE ONE-HALF TABLET DAILY 02/14/20   Renato Shin, MD  carbamazepine (TEGRETOL) 200 MG tablet TAKE 2 TABLETS AT BEDTIME 11/07/20   Biagio Borg, MD  Cholecalciferol (VITAMIN D3) 2000 UNITS TABS Take 2,000 Units by mouth every morning.     [provider]  clobetasol cream (TEMOVATE) 3.21 % Apply 1 application topically 2 (two) times daily as needed. For eczema. 05/08/18   Biagio Borg, MD  Coenzyme Q10 (CO Q 10) 100 MG CAPS Take 200 mg by mouth daily.    [provider]  dapagliflozin propanediol (FARXIGA) 5 MG TABS tablet Take 1 tablet (5 mg total) by mouth daily before breakfast. 05/22/20   Biagio Borg, MD  desonide (DESOWEN) 0.05 % cream Apply 1 application topically 2 (two) times daily.    [provider]  desoximetasone (TOPICORT) 0.25 % cream Apply 1 application  topically 2 (two) times daily. 11/13/18   Biagio Borg, MD  fenofibrate (TRICOR) 145 MG tablet Take 1 tablet (145 mg total) by mouth daily. 05/08/18   Biagio Borg, MD  fluticasone [facility]) 50 MCG/ACT nasal spray ONE SPRAY INTO NOSE EVERY DAY 01/12/18   Biagio Borg, MD  glucosamine-chondroitin 500-400 MG tablet Take 1 tablet by mouth every morning.    [provider]  glucose blood (ACCU-CHEK AVIVA PLUS) test strip Used to check blood sugars twice a day Dx E11.9 07/24/16   Biagio Borg, MD  glucose blood test strip Use to check blood sugar 2 times per day. DX Code: E11.9 07/12/15   Renato Shin, MD  hydrochlorothiazide (HYDRODIURIL) 25 MG tablet TAKE  ONE TABLET DAILY 06/20/20   Biagio Borg, MD  hydrocortisone 2.5 % lotion As directed 10/25/14   [provider]  JANUVIA 100 MG tablet TAKE ONE-HALF TABLET DAILY 10/11/20   Biagio Borg, MD  ketoconazole (NIZORAL) 2 % cream Apply 1 application topically daily. 11/13/18   Biagio Borg, MD  Lancets Misc. (ACCU-CHEK FASTCLIX LANCET) KIT AS DIRECTED 03/05/16   Biagio Borg, MD  levothyroxine (SYNTHROID) 112 MCG tablet TAKE ONE TABLET EACH DAY 03/08/20   Biagio Borg, MD  Multiple Vitamins-Minerals (ICAPS AREDS FORMULA PO) Take 1 capsule by mouth daily.    [provider]  Na Sulfate-K Sulfate-Mg Sulf SOLN Take 1 kit by mouth once. 06/01/15   Irene Shipper, MD  nateglinide (STARLIX) 60 MG tablet TAKE ONE-HALF TABLET 3 TIMES DAILY WITH MEALS 08/04/20   Biagio Borg, MD  olmesartan [facility] 40 MG tablet TAKE ONE TABLET DAILY 06/20/20   Biagio Borg, MD  Omega-3 Fatty Acids (FISH OIL) 1200 MG CAPS Take 1 capsule by mouth daily.    [provider]  OVER THE COUNTER MEDICATION Take 1 tablet by mouth daily. Tumeric    [provider]  pantoprazole (PROTONIX) 40 MG tablet TAKE ONE TABLET DAILY 10/03/20   Biagio Borg, MD  PROAIR HFA 108 [phone] Base) MCG/ACT inhaler 2 PUFFS FOUR TIMES DAILY AS NEEDED 05/25/18   Biagio Borg, MD  PROCTOSOL HC 2.5 % rectal cream APPLY TWICE DAILY RECTALLY 10/19/20   Biagio Borg, MD  sulfamethoxazole-trimethoprim (BACTRIM,SEPTRA) 400-80 MG tablet Take 1 tablet by mouth 2 (two) times daily.    [provider]  tamsulosin (FLOMAX) 0.4 MG CAPS capsule TAKE ONE CAPSULE EACH DAY 06/30/20   Biagio Borg, MD  traMADol Veatrice [place]bon) 50 MG tablet TAKE ONE TABLET EVERY EIGHT HOURS AS NEEDED 11/25/17   Biagio Borg, MD    Allergies    Cefuroxime axetil, Cefprozil, Cephalosporins, Crestor [rosuvastatin calcium], Lipitor [atorvastatin], Metformin and related, Rabeprazole sodium, and Statins  Review of Systems   Review of Systems  Constitutional: Negative for chills and fever.  HENT: Negative for congestion and sinus pain.   Eyes: Positive for visual disturbance. Negative for photophobia, pain, discharge and redness.  Respiratory: Negative for cough and shortness of breath.   Cardiovascular: Negative for chest pain and palpitations.  Gastrointestinal: Negative for abdominal pain, nausea and vomiting.  Endocrine: Negative for polydipsia and polyphagia.  Genitourinary: Negative for dysuria and hematuria.  Musculoskeletal: Negative for back pain, joint swelling and neck pain.  Skin: Negative for rash and wound.  Neurological: Negative for dizziness, syncope, weakness, numbness and headaches.  Psychiatric/Behavioral: Negative for behavioral problems and confusion.    Physical Exam Updated Vital Signs BP (!) 153/76   Pulse 66   Temp 98.2 F (36.8 C) (Oral)   Resp 19   SpO2 96%   Physical Exam Constitutional:      General: He is not in acute distress.    Appearance: Normal appearance.  HENT:     Head: Normocephalic and atraumatic.     Right Ear: External ear normal.     Left Ear: External ear normal.     Nose: Nose normal. No congestion.     Mouth/Throat:     Mouth: Mucous membranes are moist.     Pharynx: Oropharynx is clear.  Eyes:     General: Vision grossly intact.  Gaze aligned appropriately. No visual field deficit.  Right eye: No discharge.        Left eye: No discharge.     Extraocular Movements: Extraocular movements intact.     Conjunctiva/sclera: Conjunctivae normal.     Pupils: Pupils are equal, round, and reactive to light.  Cardiovascular:     Rate and Rhythm: Normal rate and regular rhythm.     Pulses: Normal pulses.     Heart sounds: No murmur heard. No friction rub. No gallop.   Pulmonary:     Effort: Pulmonary effort is normal.     Breath sounds: Normal breath sounds.  Abdominal:     General: Abdomen is flat. Bowel sounds are normal. There is no distension.     Palpations: Abdomen is soft.  Musculoskeletal:     Cervical back: Normal range of motion and neck supple.     Right lower leg: No edema.     Left lower leg: No edema.  Skin:    General: Skin is warm and dry.  Neurological:     General: No focal deficit present.     Mental Status: He is alert and oriented to person, place, and time.     Cranial Nerves: Cranial nerves are intact. No dysarthria or facial asymmetry.     Sensory: Sensation is intact.     Motor: Motor function is intact. No weakness.     Coordination: Finger-Nose-Finger Test normal.     Deep Tendon Reflexes:     Reflex Scores:      Patellar reflexes are 2+ on the right side and 2+ on the left side.    Comments: Left ankle in boot with limited ROM secondary to ankle sprain  Psychiatric:        Mood and Affect: Mood normal.        Behavior: Behavior normal.    ED Results / Procedures / Treatments   Labs (all labs ordered are listed, but only abnormal results are displayed) Labs Reviewed  CBC - Abnormal; Notable for the following components:      Result Value   RBC 4.14 (*)    Hemoglobin 12.4 (*)    All other components within normal limits  COMPREHENSIVE METABOLIC PANEL - Abnormal; Notable for the following components:   Glucose, Bld 258 (*)    BUN 47 (*)    Creatinine, Ser 1.84 (*)    GFR,  Estimated 37 (*)    All other components within normal limits  ETHANOL  PROTIME-INR  APTT  DIFFERENTIAL  RAPID URINE DRUG SCREEN, HOSP PERFORMED  URINALYSIS, ROUTINE W REFLEX MICROSCOPIC    EKG EKG Interpretation  Date/Time:  Wednesday November 08 2020 16:15:45 EDT Ventricular Rate:  76 PR Interval:  200 QRS Duration: 144 QT Interval:  402 QTC Calculation: 452 R Axis:   44 Text Interpretation: Normal sinus rhythm Right bundle branch block Abnormal ECG No significant change since last tracing Confirmed by Theotis Burrow [phone]) on 11/08/2020 5:48:40 PM   Radiology VAS US CAROTID  Result Date: 11/08/2020 Carotid Arterial Duplex Study Patient Name:  Joseph Villa  Date of Exam:   11/08/2020 Medical Rec #: [id number]         Accession #:    [id number] Date of Birth: 05/23/1941         Patient Gender: M Patient Age:   79Y Exam Location:  [facility] Procedure:      VAS US CAROTID Referring Phys: [place] --------------------------------------------------------------------------------  Indications:       Carotid  artery disease. Risk Factors:      Hypertension, hyperlipidemia, Diabetes. Other Factors:     Amaurosis fugax- visual disturbance of left eye peripheral                    field lasting 15 min. Comparison Study:  none Performing Technologist: June Leap RDMS, RVT  Examination Guidelines: A complete evaluation includes B-mode imaging, spectral Doppler, color Doppler, and power Doppler as needed of all accessible portions of each vessel. Bilateral testing is considered an integral part of a complete examination. Limited examinations for reoccurring indications may be performed as noted.  Right Carotid Findings: +----------+-------+-------+--------+------------------------+-----------------+           PSV    EDV    StenosisPlaque Description      Comments                    cm/s   cm/s                                                      +----------+-------+-------+--------+------------------------+-----------------+ CCA Prox  85     7                                                        +----------+-------+-------+--------+------------------------+-----------------+ CCA Distal64     12                                                       +----------+-------+-------+--------+------------------------+-----------------+ ICA Prox  210    56     40-59%  heterogenous and        upper end of                                      irregular               range             +----------+-------+-------+--------+------------------------+-----------------+ ICA Mid   212    37                                                       +----------+-------+-------+--------+------------------------+-----------------+ ICA Distal205    36                                                       +----------+-------+-------+--------+------------------------+-----------------+ ECA       117    15             heterogenous                              +----------+-------+-------+--------+------------------------+-----------------+ +----------+--------+-------+----------------+-------------------+  PSV cm/sEDV cmsDescribe        Arm Pressure (mmHG) +----------+--------+-------+----------------+-------------------+ [id number]            Multiphasic, WNL                    +----------+--------+-------+----------------+-------------------+ +---------+--------+--+--------+--+---------+ VertebralPSV cm/s57EDV cm/s22Antegrade +---------+--------+--+--------+--+---------+  Left Carotid Findings: +----------+--------+--------+--------+--------------------------+---------+           PSV cm/sEDV cm/sStenosisPlaque Description        Comments  +----------+--------+--------+--------+--------------------------+---------+ CCA Prox  120     27                                                   +----------+--------+--------+--------+--------------------------+---------+ CCA Distal66      17                                                  +----------+--------+--------+--------+--------------------------+---------+ ICA Prox  76      14              heterogenous and calcific Shadowing +----------+--------+--------+--------+--------------------------+---------+ ICA Mid   358     158     80-99%  heterogenous and irregular          +----------+--------+--------+--------+--------------------------+---------+ ICA Distal140     40                                                  +----------+--------+--------+--------+--------------------------+---------+ ECA       123     16              heterogenous                        +----------+--------+--------+--------+--------------------------+---------+ +----------+--------+--------+----------------+-------------------+           PSV cm/sEDV cm/sDescribe        Arm Pressure (mmHG) +----------+--------+--------+----------------+-------------------+ Subclavian160             Multiphasic, WNL                    +----------+--------+--------+----------------+-------------------+ +---------+--------+--+--------+--+---------+ VertebralPSV cm/s70EDV cm/s12Antegrade +---------+--------+--+--------+--+---------+    Findings reported to [place] at 1:25pm. Summary: Right Carotid: Velocities in the right ICA are consistent with a 40-59%                stenosis. Left Carotid: Velocities in the left ICA are consistent with a 80-99% stenosis.  *See table(s) above for measurements and observations.  Electronically signed by Deitra Mayo MD on 11/08/2020 at 1:29:33 PM.    Final      Medications Ordered in ED Medications - No data to display  ED Course  I have reviewed the triage vital signs and the nursing notes.  Pertinent labs & imaging results that were available during my care of the patient were reviewed by me and  considered in my medical decision making (see chart for details).    MDM Rules/Calculators/A&P                         Mr. Stemmer is a 80  year old male with history of hypertension, hylerpildema, diabetes, and CKD stage III presenting for transient blurred vision of there periphery of his left eye. Outpatient Carotid US with 80-99% stenosis of the left ICA. No symptoms today in ED today. HD stable on exam. No visual changes, normal strength and senastion, no CN deficits. Cr. Of 1.8 today appears at baseline. CT head negative. EKG in [facility] with RBBB unchanged from study on 10/23/2016.  Discussed with neurology Dt. Quinn Axe who recommended stroke evaluation with MRI, MR angio head and neck, and TTE. Started on ASA 325 mg and then 81 mg daily. No on any antiplatelet or anticoagulation therapies prior. Plan to admit to [facility] for further stroke workup.   Final Clinical Impression(s) / ED Diagnoses Final diagnoses:  None    Rx / DC Orders ED Discharge Orders    None       Iona Beard, MD 11/08/20 1846    Iona Beard, MD 11/08/20 1907    Rex Kras, Wenda Overland, MD 11/08/20 2021

## 2020-11-08 NOTE — ED Notes (Signed)
Provider at bedside

## 2020-11-08 NOTE — Consult Note (Signed)
NEUROLOGY CONSULTATION NOTE   Date of service: November 08, 2020 Patient Name: Joseph Villa MRN:  [id number] DOB:  [birth year] Reason for consult: "concern for amourosis fugax" Requesting Provider: Lenore Cordia, MD _ _ _   _ __   _ __ _ _  __ __   _ __   __ _  History of Present Illness  GARCIA DALZELL is a 80 y.o. male with PMH significant for allergies, DM2, DJD, GERD, HLD, HTN, hypothyroidism, OSA who presents for evaluation of left ICA stenosis.  He had a brief episode of transient partial blurred vision in the left eye on 5/31 that lasted 10-15 mins, was seen by ophthalmology same day with no abnormalities on dilated eye exam. He had carotid US today and was found to have 80-99% stenosis of the left ICA. He was sent to ED for further workup.  Does not smoke, no EtOH use, no family hx of strokes, no prior personal hx of strokes.  He tried statins in the past twice but had to stop due to joint pain and leg cramping.  MRS: 0 NIHSS: 0 TPA/Thrombectomy: not offered, no symptoms.    ROS   Constitutional Denies weight loss, fever and chills.   HEENT Denies changes in vision and hearing.   Respiratory Denies SOB and cough.   CV Denies palpitations and CP   GI Denies abdominal pain, nausea, vomiting and diarrhea.   GU Denies dysuria and urinary frequency.   MSK Denies myalgia and joint pain.   Skin Denies rash and pruritus.   Neurological Denies headache and syncope.  Psychiatric Denies recent changes in mood. Denies anxiety and depression.    Past History   Past Medical History:  Diagnosis Date  . ALLERGIC ASTHMA 11/25/2007  . ALLERGIC RHINITIS 11/25/2007  . Allergy   . Arthritis, lumbar spine 04/18/2011  . BIPOLAR DISORDER UNSPECIFIED 10/06/2009  . Blood transfusion without reported diagnosis    s/p goiter surgery age 67   . COLONIC POLYPS, HX OF 10/06/2009  . Degenerative arthritis of hip 04/18/2011  . DJD (degenerative joint disease)   . DM w/o Complication Type II  78/07/9560  . GERD 10/06/2009  . Hemorrhage yrs ago   after goiter surgery, tracheostomy inserted and later removed  . Hemorrhoid   . HYPERLIPIDEMIA 10/06/2009  . HYPERTENSION, BENIGN 07/27/2009  . HYPOTHYROIDISM 10/06/2009  . NEPHROLITHIASIS, HX OF 10/06/2009  . OBSTRUCTIVE SLEEP APNEA 11/25/2007   cpap setting of 9  . Peptic stricture of esophagus   . RBBB (right bundle branch block)   . Sleep apnea    wears c pap   Past Surgical History:  Procedure Laterality Date  . COLONOSCOPY    . GANGLION CYST EXCISION     2-3 cysts removed  . thyroid Goiter surgery    . TONSILLECTOMY    . TOTAL HIP ARTHROPLASTY  06/16/2012   Procedure: TOTAL HIP ARTHROPLASTY ANTERIOR APPROACH;  Surgeon: Mauri Pole, MD;  Location: [facility];  Service: Orthopedics;  Laterality: Left;  . UMBILICAL HERNIA REPAIR  several yrs ago  . UPPER GASTROINTESTINAL ENDOSCOPY     Family History  Problem Relation Age of Onset  . Throat cancer Mother   . Alcohol abuse Brother   . Diabetes Brother   . Drug abuse Daughter   . Alcoholism Other        uncle  . Breast cancer Father   . Colon cancer Neg Hx   . Colon polyps Neg Hx   .  Esophageal cancer Neg Hx   . Rectal cancer Neg Hx   . Stomach cancer Neg Hx    Social History   Socioeconomic History  . Marital status: Married    Spouse name: Not on file  . Number of children: 3  . Years of education: Not on file  . Highest education level: Not on file  Occupational History  . Occupation: retired  Tobacco Use  . Smoking status: Former Smoker    Packs/day: 1.50    Years: 15.00    Pack years: 22.50    Types: Cigarettes, Pipe    Quit date: 06/23/1977    Years since quitting: 43.4  . Smokeless tobacco: Never Used  Substance and Sexual Activity  . Alcohol use: No    Alcohol/week: 0.0 standard drinks  . Drug use: No  . Sexual activity: Not on file  Other Topics Concern  . Not on file  Social History Narrative  . Not on file   Social Determinants of Health    Financial Resource Strain: Not on file  Food Insecurity: Not on file  Transportation Needs: Not on file  Physical Activity: Not on file  Stress: Not on file  Social Connections: Not on file   Allergies  Allergen Reactions  . Cefuroxime Axetil Anaphylaxis    REACTION: anaphylaxis-ceftin   . Cefprozil     REACTION: unknown  . Cephalosporins Nausea Only  . Crestor [Rosuvastatin Calcium]   . Lipitor [Atorvastatin]     myalgiaas  . Metformin And Related   . Rabeprazole Sodium Nausea Only    REACTION: nausea  . Statins     Medications  (Not in a hospital admission)    Vitals   Vitals:   11/08/20 1745 11/08/20 1800 11/08/20 1815 11/08/20 1830  BP: (!) 160/70 (!) 157/72 (!) 154/74 (!) 152/71  Pulse: 71 69 71 72  Resp: (!) 23 17 17  (!) 21  Temp:      TempSrc:      SpO2: 97% 96% 96% 99%     There is no height or weight on file to calculate BMI.  Physical Exam   General: Laying comfortably in bed; in no acute distress.  HENT: Normal oropharynx and mucosa. Normal external appearance of ears and nose. Neck: Supple, no pain or tenderness  CV: No JVD. No peripheral edema.  Pulmonary: Symmetric Chest rise. Normal respiratory effort.  Abdomen: Soft to touch, non-tender.  Ext: No cyanosis, edema, or deformity  Skin: No rash. Normal palpation of skin.   Musculoskeletal: Normal digits and nails by inspection. No clubbing.   Neurologic Examination  Mental status/Cognition: Alert, oriented to self, place, month and year, good attention. Speech/language: Fluent, comprehension intact, object naming intact, repetition intact.  Cranial nerves:   CN II Pupils equal and reactive to light, no VF deficits    CN III,IV,VI EOM intact, no gaze preference or deviation, no nystagmus    CN V normal sensation in V1, V2, and V3 segments bilaterally    CN VII no asymmetry, no nasolabial fold flattening    CN VIII normal hearing to speech    CN IX & X normal palatal elevation, no uvular  deviation   CN XI 5/5 head turn and 5/5 shoulder shrug bilaterally    CN XII midline tongue protrusion    Motor:  Muscle bulk: normal, tone normal, pronator drift none tremor none Mvmt Root Nerve  Muscle Right Left Comments  SA C5/6 Ax Deltoid 5 5   EF C5/6 Mc  Biceps 5 5   EE C6/7/8 Rad Triceps 5 5   WF C6/7 Med FCR     WE C7/8 PIN ECU     F Ab C8/T1 U ADM/FDI 5 5   HF L1/2/3 Fem Illopsoas 5 5   KE L2/3/4 Fem Quad 5 5   DF L4/5 D Peron Tib Ant 5 5   PF S1/2 Tibial Grc/Sol 5 5    Reflexes:  Right Left Comments  Pectoralis      Biceps (C5/6) 2 2   Brachioradialis (C5/6) 2 2    Triceps (C6/7) 2 2    Patellar (L3/4) 2 2    Achilles (S1)      Hoffman      Plantar     Jaw jerk    Sensation:  Light touch intact   Pin prick    Temperature    Vibration   Proprioception    Coordination/Complex Motor:  - Finger to Nose intact - Heel to shin intact - Rapid alternating movement intact - Gait: Did not assess.  Labs   CBC:  Recent Labs  Lab 11/08/20 1617  WBC 7.8  NEUTROABS 4.8  HGB 12.4*  HCT 39.7  MCV 95.9  PLT 242    Basic Metabolic Panel:  Lab Results  Component Value Date   NA 138 11/08/2020   K 5.0 11/08/2020   CO2 23 11/08/2020   GLUCOSE 258 (H) 11/08/2020   BUN 47 (H) 11/08/2020   CREATININE 1.84 (H) 11/08/2020   CALCIUM 9.4 11/08/2020   GFRNONAA 37 (L) 11/08/2020   GFRAA 69 (L) 06/17/2012   Lipid Panel:  Lab Results  Component Value Date   LDLCALC 87 12/24/2013   HgbA1c:  Lab Results  Component Value Date   HGBA1C 6.9 (H) 10/23/2020   Urine Drug Screen: No results found for: LABOPIA, COCAINSCRNUR, LABBENZ, AMPHETMU, THCU, LABBARB  Alcohol Level     Component Value Date/Time   ETH <10 11/08/2020 1617    CT Head without contrast: CTH was negative for a large hypodensity concerning for a large territory infarct or hyperdensity concerning for an ICH  MRI HEAD IMPRESSION: 1. Three subcentimeter acute ischemic infarcts involving  the cortical and subcortical aspect of the left cerebral hemisphere as above, likely embolic in nature. No associated hemorrhage or mass effect. 2. Underlying mild age-related cerebral atrophy with chronic small vessel ischemic disease.  MRA HEAD IMPRESSION: 1. Negative intracranial MRA for large vessel occlusion. 2. Intracranial atherosclerotic disease with associated severe stenoses involving the left V4 segment, left SCA, and right A1 segment. Additional mild to moderate stenoses involving the mid left M1 and P2 segments.  MRA NECK IMPRESSION: 1. Eccentric atheromatous plaque about the proximal ICAs with associated stenoses of up to 75% on the left and 65% on the right. 2. Wide patency of both vertebral arteries within the neck. Right vertebral artery dominant.  Impression   WOODROW DRAB is a 80 y.o. male with PMH significant for allergies, DM2, DJD, GERD, HLD, HTN, hypothyroidism, OSA who presents for evaluation of left ICA stenosis found during evaluation for transient L eye partial bluured vision. The description of the blurred vision episode is concerning for amourosis fugax, he also has 3 subcentimeter strokes in the left cerebral hemisphere. Likely 2/2 symptomatic L ICA stenosis.  Primary Diagnosis:  Cerebral infarction due to occlusion or stenosis of left carotid artery.   Secondary Diagnosis: Essential (primary) hypertension and Type 2 diabetes mellitus w/o complications  Recommendations  Plan:   -  Frequent Neuro checks per stroke unit protocol - TTE pending. - Recommend obtaining Lipid panel with LDL - Will start Zetia due to history of joint pain and leg cramps when trialed on statin twice in the past. - Recommend HbA1c - Antithrombotic - Aspirin 325mg  once and plavix 300mg  once followed by aspirin 81mg  daily and plavix 75mg  daily. - Recommend DVT ppx - SBP goal - permissive hypertension first 24 h < 220/110. Held home meds.  - Recommend Telemetry  monitoring for arrythmia - Recommend bedside swallow screen prior to PO intake. - Stroke education booklet - Recommend PT/OT/SLP consult - Will need to consult Neuro IR in AM to evaluate for potential intervention.  ______________________________________________________________________   Thank you for the opportunity to take part in the care of this patient. If you have any further questions, please contact the neurology consultation attending.  Signed,  [place] Pager Number [phone] _ _ _   _ __   _ __ _ _  __ __   _ __   __ _

## 2020-11-08 NOTE — ED Provider Notes (Signed)
Emergency Medicine Provider Triage Evaluation Note  Joseph Villa , a 80 y.o. male  was evaluated in triage.  Pt complains of vision changes to the left eye that occurred. He had a carotid doppler and was noted to have severe stenosis of the left ica so was sent here for further eval. Denies numbness, weakness  Review of Systems  Positive: Vision changes to the left eye Negative: Numbness, weakness  Physical Exam  BP (!) 170/71 (BP Location: Left Arm)   Pulse 80   Temp 98.2 F (36.8 C) (Oral)   Resp 18   SpO2 93%  Gen:   Awake, no distress   Resp:  Normal effort  MSK:   Moves extremities without difficulty  Other:  Cranial nerves II-XII intact, 5/5 strength to the bue/ble  Medical Decision Making  Medically screening exam initiated at 4:12 PM.  Appropriate orders placed.  [name] was informed that the remainder of the evaluation will be completed by another provider, this initial triage assessment does not replace that evaluation, and the importance of remaining in the ED until their evaluation is complete.     Rodney Booze, PA-C 11/08/20 1618    Lajean Saver, MD 11/10/20 1610

## 2020-11-08 NOTE — ED Notes (Signed)
Pt returned from MRI. Provider at bedside

## 2020-11-09 ENCOUNTER — Inpatient Hospital Stay (HOSPITAL_COMMUNITY): Payer: Medicare Other

## 2020-11-09 DIAGNOSIS — E785 Hyperlipidemia, unspecified: Secondary | ICD-10-CM

## 2020-11-09 DIAGNOSIS — G453 Amaurosis fugax: Secondary | ICD-10-CM

## 2020-11-09 DIAGNOSIS — E1122 Type 2 diabetes mellitus with diabetic chronic kidney disease: Secondary | ICD-10-CM

## 2020-11-09 DIAGNOSIS — N182 Chronic kidney disease, stage 2 (mild): Secondary | ICD-10-CM

## 2020-11-09 DIAGNOSIS — N1831 Chronic kidney disease, stage 3a: Secondary | ICD-10-CM

## 2020-11-09 DIAGNOSIS — I6522 Occlusion and stenosis of left carotid artery: Secondary | ICD-10-CM

## 2020-11-09 DIAGNOSIS — E1159 Type 2 diabetes mellitus with other circulatory complications: Secondary | ICD-10-CM

## 2020-11-09 DIAGNOSIS — N183 Chronic kidney disease, stage 3 unspecified: Secondary | ICD-10-CM

## 2020-11-09 DIAGNOSIS — E039 Hypothyroidism, unspecified: Secondary | ICD-10-CM

## 2020-11-09 DIAGNOSIS — I152 Hypertension secondary to endocrine disorders: Secondary | ICD-10-CM

## 2020-11-09 LAB — SARS CORONAVIRUS 2 (TAT 6-24 HRS): SARS Coronavirus 2: NEGATIVE

## 2020-11-09 LAB — ECHOCARDIOGRAM COMPLETE BUBBLE STUDY
Area-P 1/2: 3.48 cm2
S' Lateral: 2.8 cm

## 2020-11-09 LAB — GLUCOSE, CAPILLARY
Glucose-Capillary: 137 mg/dL — ABNORMAL HIGH (ref 70–99)
Glucose-Capillary: 174 mg/dL — ABNORMAL HIGH (ref 70–99)
Glucose-Capillary: 197 mg/dL — ABNORMAL HIGH (ref 70–99)

## 2020-11-09 LAB — HEMOGLOBIN A1C
Hgb A1c MFr Bld: 7.1 % — ABNORMAL HIGH (ref 4.8–5.6)
Mean Plasma Glucose: 157 mg/dL

## 2020-11-09 LAB — MRSA PCR SCREENING: MRSA by PCR: NEGATIVE

## 2020-11-09 MED ORDER — SODIUM CHLORIDE 0.9 % IV SOLN: INTRAVENOUS | Status: DC

## 2020-11-09 NOTE — Plan of Care (Signed)
  Problem: Education: Goal: Knowledge of General Education information will improve Description: Including pain rating scale, medication(s)/side effects and non-pharmacologic comfort measures Outcome: Progressing   Problem: Health Behavior/Discharge Planning: Goal: Ability to manage health-related needs will improve Outcome: Progressing   Problem: Clinical Measurements: Goal: Ability to maintain clinical measurements within normal limits will improve Outcome: Progressing Goal: Will remain free from infection Outcome: Progressing Goal: Diagnostic test results will improve Outcome: Progressing Goal: Respiratory complications will improve Outcome: Progressing Goal: Cardiovascular complication will be avoided Outcome: Progressing   Problem: Activity: Goal: Risk for activity intolerance will decrease Outcome: Progressing   Problem: Nutrition: Goal: Adequate nutrition will be maintained Outcome: Progressing   Problem: Coping: Goal: Level of anxiety will decrease Outcome: Progressing   Problem: Elimination: Goal: Will not experience complications related to bowel motility Outcome: Progressing Goal: Will not experience complications related to urinary retention Outcome: Progressing   Problem: Pain Managment: Goal: General experience of comfort will improve Outcome: Progressing   Problem: Safety: Goal: Ability to remain free from injury will improve Outcome: Progressing   Problem: Skin Integrity: Goal: Risk for impaired skin integrity will decrease Outcome: Progressing   Problem: Education: Goal: Knowledge of disease or condition will improve Outcome: Progressing Goal: Knowledge of secondary prevention will improve Outcome: Progressing Goal: Knowledge of patient specific risk factors addressed and post discharge goals established will improve Outcome: Progressing Goal: Individualized Educational Video(s) Outcome: Progressing   Problem: Coping: Goal: Will verbalize  positive feelings about self Outcome: Progressing Goal: Will identify appropriate support needs Outcome: Progressing   Problem: Health Behavior/Discharge Planning: Goal: Ability to manage health-related needs will improve Outcome: Progressing   Problem: Self-Care: Goal: Ability to participate in self-care as condition permits will improve Outcome: Progressing Goal: Verbalization of feelings and concerns over difficulty with self-care will improve Outcome: Progressing Goal: Ability to communicate needs accurately will improve Outcome: Progressing   Problem: Ischemic Stroke/TIA Tissue Perfusion: Goal: Complications of ischemic stroke/TIA will be minimized Outcome: Progressing

## 2020-11-09 NOTE — Progress Notes (Signed)
  Echocardiogram 2D Echocardiogram has been performed.  Fidel Levy 11/09/2020, 11:15 AM

## 2020-11-09 NOTE — Evaluation (Signed)
Occupational Therapy Evaluation Patient Details Name: Joseph Villa MRN: [id number] DOB: [birth year] Today's Date: 11/09/2020    History of Present Illness 80 y/o male presented to ED on 6/8 after carotid duplex with critical results of severe stenosis of L ICA and advised to go to ED by PCP. Patient with vision changes to L eye. CT head negative. MRI found 3 subcentimeter acute ischemic infarcts invovling cortical and subcortical aspect of L cerebral hemisphere. PMH: type 2 DM, GERD, HLD, HTN, hypothyroidism, OSA.   Clinical Impression   PTA, pt was independent and lived with his wife who assisted with donning socks. Currently, pt is within functional limits for balance, strength, and cognition. Pt completing ADLs with mod I for increased time. Pt within functional limits for visual pursuits, visual fields, and convergence.  Pt and family educated on BE FAST acronym for recognizing stroke-like symptoms. Recommend discharge home. No further OT services required at this time. Re-consult if change in status or return of visual deficits.    Follow Up Recommendations  No OT follow up;Supervision - Intermittent    Equipment Recommendations  None recommended by OT    Recommendations for Other Services       Precautions / Restrictions Precautions Precautions: Fall Precaution Comments: vision changes, L ankle sprain (has boot but does not wear it unless he wants to walk fast) Restrictions Weight Bearing Restrictions: No      Mobility Bed Mobility               General bed mobility comments: Sitting in chair on arrival.    Transfers Overall transfer level: Modified independent Equipment used: None             General transfer comment: Pt ambulating to and from bathroom with mod I    Balance Overall balance assessment: No apparent balance deficits (not formally assessed)                                         ADL either performed or assessed with  clinical judgement   ADL Overall ADL's : Modified independent                                       General ADL Comments: Pt completing toilet transfer, toilet hygiene, and hand washing with mod I for increased time. Pt ambulating slowly due to L ankle sprain. Pt reports wife dons socks for him. Daughter donned socks today. Pt educated to see neurologist before return to driving.     Vision Baseline Vision/History: No visual deficits Patient Visual Report: Peripheral vision impairment Vision Assessment?: Yes Eye Alignment: Within Functional Limits Ocular Range of Motion: Within Functional Limits Alignment/Gaze Preference: Within Defined Limits Tracking/Visual Pursuits: Able to track stimulus in all quads without difficulty Convergence: Within functional limits Visual Fields: No apparent deficits Additional Comments: Pt reports his vision feels back to normal. Pt is L eye dominant.     Perception Perception Perception Tested?: No Comments: No apparent perceptual difficulties   Praxis Praxis Praxis tested?: Not tested Praxis-Other Comments: No apparent difficulty with motor planning.    Pertinent Vitals/Pain Pain Assessment: Faces Faces Pain Scale: No hurt Pain Intervention(s): Monitored during session     Hand Dominance Right   Extremity/Trunk Assessment Upper Extremity Assessment Upper Extremity Assessment: Overall WFL for tasks  assessed   Lower Extremity Assessment Lower Extremity Assessment: Defer to PT evaluation       Communication Communication Communication: HOH   Cognition Arousal/Alertness: Awake/alert Behavior During Therapy: WFL for tasks assessed/performed Overall Cognitive Status: Within Functional Limits for tasks assessed                                 General Comments: Pt conversational throughout with ability to recall HPI and aware of deficits.   General Comments  Pt wife and daughter present throughout session.  Pt, wife, and daughter educated on BE FAST acronym to recognize signs of a stroke.    Exercises     Shoulder Instructions      Home Living Family/patient expects to be discharged to:: Private residence Living Arrangements: Spouse/significant other Available Help at Discharge: Family;Available 24 hours/day Type of Home: House Home Access: Stairs to enter [employer] of Steps: 4 Entrance Stairs-Rails: Right;Left Home Layout: One level     Bathroom Shower/Tub: Occupational psychologist: Standard     Home Equipment: None          Prior Functioning/Environment Level of Independence: Independent        Comments: Pt reports that his wife typically assists with donning socks, but otherwise independent.        OT Problem List: Decreased strength;Impaired vision/perception      OT Treatment/Interventions:      OT Goals(Current goals can be found in the care plan section) Acute Rehab OT Goals Patient Stated Goal: to go home OT Goal Formulation: With patient/family  OT Frequency:     Barriers to D/C:            Co-evaluation              AM-PAC OT "6 Clicks" Daily Activity     Outcome Measure Help from another person eating meals?: None Help from another person taking care of personal grooming?: None Help from another person toileting, which includes using toliet, bedpan, or urinal?: None Help from another person bathing (including washing, rinsing, drying)?: None Help from another person to put on and taking off regular upper body clothing?: None Help from another person to put on and taking off regular lower body clothing?: None 6 Click Score: 24   End of Session Equipment Utilized During Treatment: Gait belt Nurse Communication: Mobility status  Activity Tolerance: Patient tolerated treatment well Patient left: in chair;with call bell/phone within reach;with chair alarm set  OT Visit Diagnosis: Other symptoms and signs involving the  nervous system (R29.898);Unsteadiness on feet (R26.81)                Time: 3557-3220 OT Time Calculation (min): 22 min Charges:  OT General Charges $OT Visit: 1 Visit OT Evaluation $OT Eval Low Complexity: 1 Low  Shanda Howells, OTDS   Shanda Howells 11/09/2020, 1:23 PM

## 2020-11-09 NOTE — Evaluation (Signed)
Physical Therapy Evaluation Patient Details Name: Joseph Villa MRN: [id number] DOB: [birth year] Today's Date: 11/09/2020   History of Present Illness  80 y/o male presented to ED on 6/8 after carotid duplex with critical results of severe stenosis of L ICA and advised to go to ED by PCP. Patient with vision changes to L eye. CT head negative. MRI found 3 subcentimeter acute ischemic infarcts invovling cortical and subcortical aspect of L cerebral hemisphere. PMH: type 2 DM, GERD, HLD, HTN, hypothyroidism, OSA.  Clinical Impression  PTA, patient lives with wife and reports independence with mobility. Patient with recent L ankle sprain and wears boot as he feels he needs it. Patient currently functioning at modI level for mobility with no AD. Patient self selected slowed gait speed due to L ankle sprain and requested not to don boot. Patient with no apparent balance deficits noted. No further skilled PT needs required acutely. No PT follow up recommended at this time.     Follow Up Recommendations No PT follow up    Equipment Recommendations  None recommended by PT    Recommendations for Other Services       Precautions / Restrictions Precautions Precautions: Fall Precaution Comments: vision changes, L ankle sprain (has boot but does not wear it unless he wants to walk fast) Restrictions Weight Bearing Restrictions: No      Mobility  Bed Mobility               General bed mobility comments: sitting EOB on arrival with RN present    Transfers Overall transfer level: Modified independent Equipment used: None                Ambulation/Gait Ambulation/Gait assistance: Modified independent (Device/Increase time) Gait Distance (Feet): 200 Feet Assistive device: None Gait Pattern/deviations: Step-through pattern;Decreased stance time - left Gait velocity: decreased   General Gait Details: slow steady gait due to L ankle sprain and not wanting to wear boot for  ambulation  Stairs Stairs: Yes Stairs assistance: Modified independent (Device/Increase time) Stair Management: Two rails;Step to pattern;Forwards Number of Stairs: 2    Wheelchair Mobility    Modified Rankin (Stroke Patients Only) Modified Rankin (Stroke Patients Only) Pre-Morbid Rankin Score: No symptoms Modified Rankin: Slight disability     Balance Overall balance assessment: No apparent balance deficits (not formally assessed)                                           Pertinent Vitals/Pain Pain Assessment: No/denies pain    Home Living Family/patient expects to be discharged to:: Private residence Living Arrangements: Spouse/significant other Available Help at Discharge: Family;Available 24 hours/day Type of Home: House Home Access: Stairs to enter Entrance Stairs-Rails: Psychiatric nurse of Steps: 4 Home Layout: One level Home Equipment: None      Prior Function Level of Independence: Independent         Comments: enjoys riding motorcycles     Journalist, newspaper        Extremity/Trunk Assessment   Upper Extremity Assessment Upper Extremity Assessment: Defer to OT evaluation    Lower Extremity Assessment Lower Extremity Assessment: Generalized weakness (L ankle sprain)       Communication   Communication: HOH  Cognition Arousal/Alertness: Awake/alert Behavior During Therapy: WFL for tasks assessed/performed Overall Cognitive Status: Within Functional Limits for tasks assessed  General Comments      Exercises     Assessment/Plan    PT Assessment Patent does not need any further PT services  PT Problem List         PT Treatment Interventions      PT Goals (Current goals can be found in the Care Plan section)  Acute Rehab PT Goals Patient Stated Goal: to go home PT Goal Formulation: All assessment and education complete, DC therapy     Frequency     Barriers to discharge        Co-evaluation               AM-PAC PT "6 Clicks" Mobility  Outcome Measure Help needed turning from your back to your side while in a flat bed without using bedrails?: None Help needed moving from lying on your back to sitting on the side of a flat bed without using bedrails?: None Help needed moving to and from a bed to a chair (including a wheelchair)?: None Help needed standing up from a chair using your arms (e.g., wheelchair or bedside chair)?: None Help needed to walk in hospital room?: None Help needed climbing 3-5 steps with a railing? : None 6 Click Score: 24    End of Session   Activity Tolerance: Patient tolerated treatment well Patient left: in chair;with call bell/phone within reach;with chair alarm set Nurse Communication: Mobility status PT Visit Diagnosis: Muscle weakness (generalized) (M62.81)    Time: 9323-5573 PT Time Calculation (min) (ACUTE ONLY): 27 min   Charges:   PT Evaluation $PT Eval Low Complexity: 1 Low PT Treatments $Therapeutic Activity: 8-22 mins        Joseph Villa A. Gilford Rile PT, DPT Acute Rehabilitation Services Pager [phone] Office [phone]   Joseph Villa 11/09/2020, 8:44 AM

## 2020-11-09 NOTE — Progress Notes (Signed)
PROGRESS NOTE  Joseph Villa MCN:[id number] DOB: [birth year] DOA: 11/08/2020 PCP: Biagio Borg, MD   LOS: 1 day   Brief narrative:  Joseph Villa is a 80 y.o. male with medical history significant for diabetes mellitus type 2, hypertension, hyperlipidemia, chronic kidney disease stage III,  hypothyroidism, RBBB, and OSA on CPAP who presented to the hospital with transient left vision changes and blurriness of the medial half of the left visual field on 10/31/20 which occurred after he walked up 4 stairs and turned to the left into the house.  This lasted about 10-15 minutes before resolving on its own and he has not experienced recurrent symptoms since then.  He saw his ophthalmologist, Dr. Ellie Lunch, who was concerned about left-sided carotid artery blockage as the cause of his symptoms. Carotid Dopplers were ordered and completed earlier today (6/8).  Results were significant for left ICA stenosis 80-99%.  Right ICA stenosis 40-59% also noted.  Patient was advised to present to the ED for further evaluation. Neurology was consulted who recommended admission for TIA stroke work-up.   Assessment/Plan:  Principal Problem:   Amaurosis fugax of left eye Active Problems:   Hypothyroidism   Obstructive sleep apnea   Hypertension associated with diabetes ([place])   Type 2 diabetes mellitus (HCC)   CKD (chronic kidney disease), stage III (HCC)   Hyperlipidemia associated with type 2 diabetes mellitus ([place])   Acute ischemic infarcts of the left cerebral hemisphere and amaurosis fugax of left eye due to left ICA stenosis: No current symptoms.  Significant left ICA stenosis.  Vascular surgery has been consulted.  Awaiting input.  Continue aspirin Plavix.  On Zetia due to prior statin intolerance.  PT OT speech therapy.   Type 2 diabetes: Farxiga, Januvia, nateglinide, bromocriptine on hold.  Continue sliding scale insulin, Accu-Cheks, diabetic diet.     Hypertension: On hold for permissive  hypertension.   CKD stage IIIb: Chronic and stable.  Monitor BMP closely.Hypothyroidism: TSH 6.54 on 10/23/2020.  Continue Synthroid.   Hyperlipidemia: On Zetia due to statin intolerance.   Asthma: Stable, continue albuterol as needed.   Mood disorder: On Tegretol   OSA: Continue CPAP nightly.  DVT prophylaxis: heparin injection 5,000 Units Start: 11/08/20 2200   Code Status: Full code  Family Communication: Spoke with the patient's family at bedside  Status is: Inpatient  Remains inpatient appropriate because:Ongoing diagnostic testing needed not appropriate for outpatient work up, IV treatments appropriate due to intensity of illness or inability to take PO, Inpatient level of care appropriate due to severity of illness, and vascular surgery consultation.  Dispo: The patient is from: Home              Anticipated d/c is to: Home              Patient currently is not medically stable to d/c.   Difficult to place patient No     Consultants: Neurology Vascular surgery  Procedures: None  Anti-infectives:  None  Anti-infectives (From admission, onward)    None        Subjective: Today, patient was seen and examined at bedside.  Denies any dizziness, lightheadedness, blurry vision  Objective: Vitals:   11/09/20 0747 11/09/20 1208  BP: (!) 159/76 (!) 156/69  Pulse: 65 63  Resp: 18 18  Temp: 98.1 F (36.7 C) 97.9 F (36.6 C)  SpO2: 96% 98%    Intake/Output Summary (Last 24 hours) at 11/09/2020 1447 Last data filed at 11/09/2020 0536 Gross per  24 hour  Intake --  Output 210 ml  Net -210 ml   Filed Weights   11/08/20 2007  Weight: 113.4 kg   Body mass index is 35.87 kg/m.   Physical Exam: GENERAL: Patient is alert awake and oriented. Not in obvious distress.  Obese, hard of hearing HENT: No scleral pallor or icterus. Pupils equally reactive to light. Oral mucosa is moist NECK: is supple, no gross swelling noted. CHEST: Clear to auscultation.  No crackles or wheezes.  Diminished breath sounds bilaterally. CVS: S1 and S2 heard, no murmur. Regular rate and rhythm.  ABDOMEN: Soft, non-tender, bowel sounds are present. EXTREMITIES: No edema. CNS: Cranial nerves are intact. No focal motor deficits. SKIN: warm and dry without rashes.  Data Review: I have personally reviewed the following laboratory data and studies,  CBC: Recent Labs  Lab 11/08/20 1617  WBC 7.8  NEUTROABS 4.8  HGB 12.4*  HCT 39.7  MCV 95.9  PLT 161   Basic Metabolic Panel: Recent Labs  Lab 11/08/20 1617  NA 138  K 5.0  CL 105  CO2 23  GLUCOSE 258*  BUN 47*  CREATININE 1.84*  CALCIUM 9.4   Liver Function Tests: Recent Labs  Lab 11/08/20 1617  AST 15  ALT 19  ALKPHOS 75  BILITOT 0.5  PROT 7.1  ALBUMIN 3.5   No results for input(s): LIPASE, AMYLASE in the last 168 hours. No results for input(s): AMMONIA in the last 168 hours. Cardiac Enzymes: No results for input(s): CKTOTAL, CKMB, CKMBINDEX, TROPONINI in the last 168 hours. BNP (last 3 results) No results for input(s): BNP in the last 8760 hours.  ProBNP (last 3 results) No results for input(s): PROBNP in the last 8760 hours.  CBG: Recent Labs  Lab 11/08/20 2200 11/09/20 0607 11/09/20 1231  GLUCAP 139* 137* 174*   Recent Results (from the past 240 hour(s))  SARS CORONAVIRUS 2 (TAT 6-24 HRS) Nasopharyngeal Nasopharyngeal Swab     Status: None   Collection Time: 11/08/20 11:21 PM   Specimen: Nasopharyngeal Swab  Result Value Ref Range Status   SARS Coronavirus 2 NEGATIVE NEGATIVE Final    Comment: (NOTE) SARS-CoV-2 target nucleic acids are NOT DETECTED.  The SARS-CoV-2 RNA is generally detectable in upper and lower respiratory specimens during the acute phase of infection. Negative results do not preclude SARS-CoV-2 infection, do not rule out co-infections with other pathogens, and should not be used as the sole basis for treatment or other patient management  decisions. Negative results must be combined with clinical observations, patient history, and epidemiological information. The expected result is Negative.  Fact Sheet for Patients: SugarRoll.be  Fact Sheet for Healthcare Providers: https://www.woods-mathews.com/  This test is not yet approved or cleared by the [place] FDA and  has been authorized for detection and/or diagnosis of SARS-CoV-2 by FDA under an Emergency Use Authorization (EUA). This EUA will remain  in effect (meaning this test can be used) for the duration of the COVID-19 declaration under Se ction 564(b)(1) of the Act, 21 U.S.C. section 360bbb-3(b)(1), unless the authorization is terminated or revoked sooner.  Performed at [facility], [place] [address] [place], [place] [address]   MRSA PCR Screening     Status: None   Collection Time: 11/09/20  7:36 AM   Specimen: Nasopharyngeal  Result Value Ref Range Status   MRSA by PCR NEGATIVE NEGATIVE Final    Comment:        The GeneXpert MRSA Assay (FDA approved for NASAL  specimens only), is one component of a comprehensive MRSA colonization surveillance program. It is not intended to diagnose MRSA infection nor to guide or monitor treatment for MRSA infections. Performed at [facility], [place] [address], [place], [place] [address]      Studies: CT HEAD WO CONTRAST  Result Date: 11/08/2020 CLINICAL DATA:  Neurological deficit.  Acute stroke suspected. EXAM: CT HEAD WITHOUT CONTRAST TECHNIQUE: Contiguous axial images were obtained from the base of the skull through the vertex without intravenous contrast. COMPARISON:  None. FINDINGS: Brain: Age related volume loss. No focal abnormality affects the brainstem or cerebellum. Cerebral hemispheres show mild chronic small-vessel changes of the white matter. No cortical or large vessel territory infarction. No mass lesion, hemorrhage, hydrocephalus or extra-axial  collection. Vascular: There is atherosclerotic calcification of the major vessels at the base of the brain. Skull: Negative Sinuses/Orbits: Clear/normal Other: None IMPRESSION: No acute CT finding. Mild age related volume loss and mild small vessel change of the hemispheric white matter. Electronically Signed   By: Nelson Chimes M.D.   On: 11/08/2020 18:50   MR ANGIO HEAD WO CONTRAST  Result Date: 11/08/2020 CLINICAL DATA:  Initial evaluation for neuro deficit, stroke suspected. EXAM: MRI HEAD WITHOUT CONTRAST MRA HEAD WITHOUT CONTRAST MRA NECK WITHOUT CONTRAST TECHNIQUE: Multiplanar, multiecho pulse sequences of the brain and surrounding structures were obtained without intravenous contrast. Angiographic images of the Circle of Willis were obtained using MRA technique without intravenous contrast. Angiographic images of the neck were obtained using MRA technique without intravenous contrast. Carotid stenosis measurements (when applicable) are obtained utilizing NASCET criteria, using the distal internal carotid diameter as the denominator. COMPARISON:  Prior head CT from earlier the same day. FINDINGS: MRI HEAD FINDINGS Brain: Mild age-related cerebral atrophy. Patchy T2/FLAIR hyperintensity within the periventricular white matter most consistent with chronic small vessel ischemic disease, also mild for age. There are 3 subcentimeter foci of restricted diffusion involving the cortical and subcortical aspect of the left cerebral hemisphere, involving the left frontal and parietal lobes (series 2, image 36) as well as the left occipital lobe (series 2, image 23). Findings are likely embolic. No associated hemorrhage or mass effect. No other evidence for acute or subacute ischemia. Gray-white matter differentiation otherwise maintained. No other areas of remote cortical infarction. No other evidence for acute or chronic intracranial hemorrhage. No mass lesion, midline shift or mass effect. No hydrocephalus or  extra-axial fluid collection. Pituitary gland suprasellar region normal. Midline structures intact. Vascular: Major intracranial vascular flow voids are maintained. Skull and upper cervical spine: Craniocervical junction normal. Bone marrow signal intensity within normal limits. No scalp soft tissue abnormality. Sinuses/Orbits: Globes and orbital soft tissues within normal limits. Paranasal sinuses are clear. No mastoid effusion. Inner ear structures within normal limits. Other: None. MRA HEAD FINDINGS ANTERIOR CIRCULATION: Visualized distal cervical segments of the internal carotid arteries are patent with antegrade flow. Petrous, cavernous, and supraclinoid segments patent without stenosis or other abnormality. Left A1 widely patent. Focal severe proximal right A1 stenosis (series 252, image 9). Normal anterior communicating artery complex. ACAs well perfused and patent distally. Right M1 widely patent. Short-segment mild mid left M1 stenosis (series 252, image 9). Normal MCA bifurcations. Distal MCA branches well perfused and symmetric. POSTERIOR CIRCULATION: Dominant right V4 segment widely patent to the vertebrobasilar junction. Focal severe left V4 stenosis seen beyond the takeoff of the left PICA (series 255, image 1). Both PICA origins patent and normal. Basilar mildly irregular but is patent to its  distal aspect without stenosis. Right superior cerebellar artery widely patent. Severe stenosis at the origin of the left superior cerebellar artery (series 255, image 1). Both PCAs primarily supplied via the basilar. Right PCA widely patent to its distal aspect. Mild-to-moderate stenosis at the mid left P2 segment (series 255, image 9). No intracranial aneurysm. MRA NECK FINDINGS AORTIC ARCH: Examination somewhat technically limited by lack of IV contrast. Aortic arch and origin of the great vessels incompletely assessed on this exam. RIGHT CAROTID SYSTEM: Visualized right CCA patent to the bifurcation without  stenosis. Eccentric atheromatous plaque at the proximal right ICA with associated stenosis of up to 65% by NASCET criteria (series 3, image 78). Right ICA patent distally without stenosis, evidence for dissection, or occlusion. LEFT CAROTID SYSTEM: Visualized left CCA patent to the bifurcation without stenosis. Atheromatous plaque about the proximal left ICA with associated stenosis of up to approximately 75% by NASCET criteria (series 3, image 62). Left ICA patent distally without stenosis, dissection or occlusion. VERTEBRAL ARTERIES: Both vertebral arteries arise from the subclavian arteries. Right vertebral artery dominant. Vertebral arteries patent within the neck without significant stenosis, evidence for dissection or occlusion. IMPRESSION: MRI HEAD IMPRESSION: 1. Three subcentimeter acute ischemic infarcts involving the cortical and subcortical aspect of the left cerebral hemisphere as above, likely embolic in nature. No associated hemorrhage or mass effect. 2. Underlying mild age-related cerebral atrophy with chronic small vessel ischemic disease. MRA HEAD IMPRESSION: 1. Negative intracranial MRA for large vessel occlusion. 2. Intracranial atherosclerotic disease with associated severe stenoses involving the left V4 segment, left SCA, and right A1 segment. Additional mild to moderate stenoses involving the mid left M1 and P2 segments. MRA NECK IMPRESSION: 1. Eccentric atheromatous plaque about the proximal ICAs with associated stenoses of up to 75% on the left and 65% on the right. 2. Wide patency of both vertebral arteries within the neck. Right vertebral artery dominant. Electronically Signed   By: Jeannine Boga M.D.   On: 11/08/2020 20:14   MR ANGIO NECK WO CONTRAST  Result Date: 11/08/2020 CLINICAL DATA:  Initial evaluation for neuro deficit, stroke suspected. EXAM: MRI HEAD WITHOUT CONTRAST MRA HEAD WITHOUT CONTRAST MRA NECK WITHOUT CONTRAST TECHNIQUE: Multiplanar, multiecho pulse sequences of  the brain and surrounding structures were obtained without intravenous contrast. Angiographic images of the Circle of Willis were obtained using MRA technique without intravenous contrast. Angiographic images of the neck were obtained using MRA technique without intravenous contrast. Carotid stenosis measurements (when applicable) are obtained utilizing NASCET criteria, using the distal internal carotid diameter as the denominator. COMPARISON:  Prior head CT from earlier the same day. FINDINGS: MRI HEAD FINDINGS Brain: Mild age-related cerebral atrophy. Patchy T2/FLAIR hyperintensity within the periventricular white matter most consistent with chronic small vessel ischemic disease, also mild for age. There are 3 subcentimeter foci of restricted diffusion involving the cortical and subcortical aspect of the left cerebral hemisphere, involving the left frontal and parietal lobes (series 2, image 36) as well as the left occipital lobe (series 2, image 23). Findings are likely embolic. No associated hemorrhage or mass effect. No other evidence for acute or subacute ischemia. Gray-white matter differentiation otherwise maintained. No other areas of remote cortical infarction. No other evidence for acute or chronic intracranial hemorrhage. No mass lesion, midline shift or mass effect. No hydrocephalus or extra-axial fluid collection. Pituitary gland suprasellar region normal. Midline structures intact. Vascular: Major intracranial vascular flow voids are maintained. Skull and upper cervical spine: Craniocervical junction normal. Bone marrow signal intensity  within normal limits. No scalp soft tissue abnormality. Sinuses/Orbits: Globes and orbital soft tissues within normal limits. Paranasal sinuses are clear. No mastoid effusion. Inner ear structures within normal limits. Other: None. MRA HEAD FINDINGS ANTERIOR CIRCULATION: Visualized distal cervical segments of the internal carotid arteries are patent with antegrade  flow. Petrous, cavernous, and supraclinoid segments patent without stenosis or other abnormality. Left A1 widely patent. Focal severe proximal right A1 stenosis (series 252, image 9). Normal anterior communicating artery complex. ACAs well perfused and patent distally. Right M1 widely patent. Short-segment mild mid left M1 stenosis (series 252, image 9). Normal MCA bifurcations. Distal MCA branches well perfused and symmetric. POSTERIOR CIRCULATION: Dominant right V4 segment widely patent to the vertebrobasilar junction. Focal severe left V4 stenosis seen beyond the takeoff of the left PICA (series 255, image 1). Both PICA origins patent and normal. Basilar mildly irregular but is patent to its distal aspect without stenosis. Right superior cerebellar artery widely patent. Severe stenosis at the origin of the left superior cerebellar artery (series 255, image 1). Both PCAs primarily supplied via the basilar. Right PCA widely patent to its distal aspect. Mild-to-moderate stenosis at the mid left P2 segment (series 255, image 9). No intracranial aneurysm. MRA NECK FINDINGS AORTIC ARCH: Examination somewhat technically limited by lack of IV contrast. Aortic arch and origin of the great vessels incompletely assessed on this exam. RIGHT CAROTID SYSTEM: Visualized right CCA patent to the bifurcation without stenosis. Eccentric atheromatous plaque at the proximal right ICA with associated stenosis of up to 65% by NASCET criteria (series 3, image 78). Right ICA patent distally without stenosis, evidence for dissection, or occlusion. LEFT CAROTID SYSTEM: Visualized left CCA patent to the bifurcation without stenosis. Atheromatous plaque about the proximal left ICA with associated stenosis of up to approximately 75% by NASCET criteria (series 3, image 62). Left ICA patent distally without stenosis, dissection or occlusion. VERTEBRAL ARTERIES: Both vertebral arteries arise from the subclavian arteries. Right vertebral artery  dominant. Vertebral arteries patent within the neck without significant stenosis, evidence for dissection or occlusion. IMPRESSION: MRI HEAD IMPRESSION: 1. Three subcentimeter acute ischemic infarcts involving the cortical and subcortical aspect of the left cerebral hemisphere as above, likely embolic in nature. No associated hemorrhage or mass effect. 2. Underlying mild age-related cerebral atrophy with chronic small vessel ischemic disease. MRA HEAD IMPRESSION: 1. Negative intracranial MRA for large vessel occlusion. 2. Intracranial atherosclerotic disease with associated severe stenoses involving the left V4 segment, left SCA, and right A1 segment. Additional mild to moderate stenoses involving the mid left M1 and P2 segments. MRA NECK IMPRESSION: 1. Eccentric atheromatous plaque about the proximal ICAs with associated stenoses of up to 75% on the left and 65% on the right. 2. Wide patency of both vertebral arteries within the neck. Right vertebral artery dominant. Electronically Signed   By: Jeannine Boga M.D.   On: 11/08/2020 20:14   MR Brain Wo Contrast (neuro protocol)  Result Date: 11/08/2020 CLINICAL DATA:  Initial evaluation for neuro deficit, stroke suspected. EXAM: MRI HEAD WITHOUT CONTRAST MRA HEAD WITHOUT CONTRAST MRA NECK WITHOUT CONTRAST TECHNIQUE: Multiplanar, multiecho pulse sequences of the brain and surrounding structures were obtained without intravenous contrast. Angiographic images of the Circle of Willis were obtained using MRA technique without intravenous contrast. Angiographic images of the neck were obtained using MRA technique without intravenous contrast. Carotid stenosis measurements (when applicable) are obtained utilizing NASCET criteria, using the distal internal carotid diameter as the denominator. COMPARISON:  Prior head CT from  earlier the same day. FINDINGS: MRI HEAD FINDINGS Brain: Mild age-related cerebral atrophy. Patchy T2/FLAIR hyperintensity within the  periventricular white matter most consistent with chronic small vessel ischemic disease, also mild for age. There are 3 subcentimeter foci of restricted diffusion involving the cortical and subcortical aspect of the left cerebral hemisphere, involving the left frontal and parietal lobes (series 2, image 36) as well as the left occipital lobe (series 2, image 23). Findings are likely embolic. No associated hemorrhage or mass effect. No other evidence for acute or subacute ischemia. Gray-white matter differentiation otherwise maintained. No other areas of remote cortical infarction. No other evidence for acute or chronic intracranial hemorrhage. No mass lesion, midline shift or mass effect. No hydrocephalus or extra-axial fluid collection. Pituitary gland suprasellar region normal. Midline structures intact. Vascular: Major intracranial vascular flow voids are maintained. Skull and upper cervical spine: Craniocervical junction normal. Bone marrow signal intensity within normal limits. No scalp soft tissue abnormality. Sinuses/Orbits: Globes and orbital soft tissues within normal limits. Paranasal sinuses are clear. No mastoid effusion. Inner ear structures within normal limits. Other: None. MRA HEAD FINDINGS ANTERIOR CIRCULATION: Visualized distal cervical segments of the internal carotid arteries are patent with antegrade flow. Petrous, cavernous, and supraclinoid segments patent without stenosis or other abnormality. Left A1 widely patent. Focal severe proximal right A1 stenosis (series 252, image 9). Normal anterior communicating artery complex. ACAs well perfused and patent distally. Right M1 widely patent. Short-segment mild mid left M1 stenosis (series 252, image 9). Normal MCA bifurcations. Distal MCA branches well perfused and symmetric. POSTERIOR CIRCULATION: Dominant right V4 segment widely patent to the vertebrobasilar junction. Focal severe left V4 stenosis seen beyond the takeoff of the left PICA (series  255, image 1). Both PICA origins patent and normal. Basilar mildly irregular but is patent to its distal aspect without stenosis. Right superior cerebellar artery widely patent. Severe stenosis at the origin of the left superior cerebellar artery (series 255, image 1). Both PCAs primarily supplied via the basilar. Right PCA widely patent to its distal aspect. Mild-to-moderate stenosis at the mid left P2 segment (series 255, image 9). No intracranial aneurysm. MRA NECK FINDINGS AORTIC ARCH: Examination somewhat technically limited by lack of IV contrast. Aortic arch and origin of the great vessels incompletely assessed on this exam. RIGHT CAROTID SYSTEM: Visualized right CCA patent to the bifurcation without stenosis. Eccentric atheromatous plaque at the proximal right ICA with associated stenosis of up to 65% by NASCET criteria (series 3, image 78). Right ICA patent distally without stenosis, evidence for dissection, or occlusion. LEFT CAROTID SYSTEM: Visualized left CCA patent to the bifurcation without stenosis. Atheromatous plaque about the proximal left ICA with associated stenosis of up to approximately 75% by NASCET criteria (series 3, image 62). Left ICA patent distally without stenosis, dissection or occlusion. VERTEBRAL ARTERIES: Both vertebral arteries arise from the subclavian arteries. Right vertebral artery dominant. Vertebral arteries patent within the neck without significant stenosis, evidence for dissection or occlusion. IMPRESSION: MRI HEAD IMPRESSION: 1. Three subcentimeter acute ischemic infarcts involving the cortical and subcortical aspect of the left cerebral hemisphere as above, likely embolic in nature. No associated hemorrhage or mass effect. 2. Underlying mild age-related cerebral atrophy with chronic small vessel ischemic disease. MRA HEAD IMPRESSION: 1. Negative intracranial MRA for large vessel occlusion. 2. Intracranial atherosclerotic disease with associated severe stenoses involving  the left V4 segment, left SCA, and right A1 segment. Additional mild to moderate stenoses involving the mid left M1 and P2 segments. MRA NECK  IMPRESSION: 1. Eccentric atheromatous plaque about the proximal ICAs with associated stenoses of up to 75% on the left and 65% on the right. 2. Wide patency of both vertebral arteries within the neck. Right vertebral artery dominant. Electronically Signed   By: Jeannine Boga M.D.   On: 11/08/2020 20:14   VAS US CAROTID  Result Date: 11/08/2020 Carotid Arterial Duplex Study Patient Name:  DARIOUS REHMAN  Date of Exam:   11/08/2020 Medical Rec #: [id number]         Accession #:    [id number] Date of Birth: July 16, 1940         Patient Gender: M Patient Age:   79Y Exam Location:  [facility] Procedure:      VAS US CAROTID Referring Phys: E[place] --------------------------------------------------------------------------------  Indications:       Carotid artery disease. Risk Factors:      Hypertension, hyperlipidemia, Diabetes. Other Factors:     Amaurosis fugax- visual disturbance of left eye peripheral                    field lasting 15 min. Comparison Study:  none Performing Technologist: June Leap RDMS, RVT  Examination Guidelines: A complete evaluation includes B-mode imaging, spectral Doppler, color Doppler, and power Doppler as needed of all accessible portions of each vessel. Bilateral testing is considered an integral part of a complete examination. Limited examinations for reoccurring indications may be performed as noted.  Right Carotid Findings: +----------+-------+-------+--------+------------------------+-----------------+           PSV    EDV    StenosisPlaque Description      Comments                    cm/s   cm/s                                                     +----------+-------+-------+--------+------------------------+-----------------+ CCA Prox  85     7                                                         +----------+-------+-------+--------+------------------------+-----------------+ CCA Distal64     12                                                       +----------+-------+-------+--------+------------------------+-----------------+ ICA Prox  210    56     40-59%  heterogenous and        upper end of                                      irregular               range             +----------+-------+-------+--------+------------------------+-----------------+ ICA Mid   212    37                                                       +----------+-------+-------+--------+------------------------+-----------------+  ICA Distal205    36                                                       +----------+-------+-------+--------+------------------------+-----------------+ ECA       117    15             heterogenous                              +----------+-------+-------+--------+------------------------+-----------------+ +----------+--------+-------+----------------+-------------------+           PSV cm/sEDV cmsDescribe        Arm Pressure (mmHG) +----------+--------+-------+----------------+-------------------+ [id number]            Multiphasic, WNL                    +----------+--------+-------+----------------+-------------------+ +---------+--------+--+--------+--+---------+ VertebralPSV cm/s57EDV cm/s22Antegrade +---------+--------+--+--------+--+---------+  Left Carotid Findings: +----------+--------+--------+--------+--------------------------+---------+           PSV cm/sEDV cm/sStenosisPlaque Description        Comments  +----------+--------+--------+--------+--------------------------+---------+ CCA Prox  120     27                                                  +----------+--------+--------+--------+--------------------------+---------+ CCA Distal66      17                                                   +----------+--------+--------+--------+--------------------------+---------+ ICA Prox  76      14              heterogenous and calcific Shadowing +----------+--------+--------+--------+--------------------------+---------+ ICA Mid   358     158     80-99%  heterogenous and irregular          +----------+--------+--------+--------+--------------------------+---------+ ICA Distal140     40                                                  +----------+--------+--------+--------+--------------------------+---------+ ECA       123     16              heterogenous                        +----------+--------+--------+--------+--------------------------+---------+ +----------+--------+--------+----------------+-------------------+           PSV cm/sEDV cm/sDescribe        Arm Pressure (mmHG) +----------+--------+--------+----------------+-------------------+ Subclavian160             Multiphasic, WNL                    +----------+--------+--------+----------------+-------------------+ +---------+--------+--+--------+--+---------+ VertebralPSV cm/s70EDV cm/s12Antegrade +---------+--------+--+--------+--+---------+    Findings reported to [place] at 1:25pm. Summary: Right Carotid: Velocities in the right ICA are consistent with a 40-59%                stenosis. Left Carotid: Velocities in the  left ICA are consistent with a 80-99% stenosis.  *See table(s) above for measurements and observations.  Electronically signed by Deitra Mayo MD on 11/08/2020 at 1:29:33 PM.    Final       Flora Lipps, MD  [facility] 11/09/2020  If 7PM-7AM, please contact night-coverage

## 2020-11-09 NOTE — Consult Note (Addendum)
Hospital Consult    Reason for Consult:  Left ICA stenosis  Requesting Physician: Ruta Hinds NP MRN #:  [id number]  History of Present Illness: This is a 80 y.o. male with past medical history significant for Diabetes mellitus, HTN, HLD, hypothyroidism, and CKD stage III who presented to ED at Ophthalmologist recommendation. Patient explains that last Tuesday 5/31 he was taking out the trash and upon returning to his porch he lost peripheral vision in part of his left eye and says he saw just a black dot. After going inside and after approximately 5-10 minutes this resolved. He went to his ophthalmologist office the same day for evaluation and the patient reports  that they did not find any abnormalities on exam but his Ophthalmologist was concerned about possible stroke. Carotid ultrasound was ordered and after results patient was directed to go to ED.  He has never experienced anything like this before. He has had no recurrence since that episode last Tuesday. He reports no slurred speech, facial drooping, weakness or numbness of his upper or lower extremities.   He has history of prior neck surgery for removal of a goiter many years ago. He had post operative hemorrhage requiring a second surgery. He and his wife do not recall any history of radiation. No other neck surgeries. He is not a smoker. He is intolerant to statins due to myalgias and does not take Aspirin or other antiplatelets.    Past Medical History:  Diagnosis Date   ALLERGIC ASTHMA 11/25/2007   ALLERGIC RHINITIS 11/25/2007   Allergy    Arthritis, lumbar spine 04/18/2011   BIPOLAR DISORDER UNSPECIFIED 10/06/2009   Blood transfusion without reported diagnosis    s/p goiter surgery age 71    COLONIC POLYPS, HX OF 10/06/2009   Degenerative arthritis of hip 04/18/2011   DJD (degenerative joint disease)    DM w/o Complication Type II 69/11/7891   GERD 10/06/2009   Hemorrhage yrs ago   after goiter surgery, tracheostomy inserted  and later removed   Hemorrhoid    HYPERLIPIDEMIA 10/06/2009   HYPERTENSION, BENIGN 07/27/2009   HYPOTHYROIDISM 10/06/2009   NEPHROLITHIASIS, HX OF 10/06/2009   OBSTRUCTIVE SLEEP APNEA 11/25/2007   cpap setting of 9   Peptic stricture of esophagus    RBBB (right bundle branch block)    Sleep apnea    wears c pap    Past Surgical History:  Procedure Laterality Date   COLONOSCOPY     GANGLION CYST EXCISION     2-3 cysts removed   thyroid Goiter surgery     TONSILLECTOMY     TOTAL HIP ARTHROPLASTY  06/16/2012   Procedure: TOTAL HIP ARTHROPLASTY ANTERIOR APPROACH;  Surgeon: Mauri Pole, MD;  Location: [facility];  Service: Orthopedics;  Laterality: Left;   UMBILICAL HERNIA REPAIR  several yrs ago   UPPER GASTROINTESTINAL ENDOSCOPY      Allergies  Allergen Reactions   Cefuroxime Axetil Anaphylaxis    REACTION: anaphylaxis-ceftin    Cefprozil     REACTION: unknown   Cephalosporins Nausea Only   Crestor [Rosuvastatin Calcium]    Lipitor [Atorvastatin]     myalgiaas   Metformin And Related    Rabeprazole Sodium Nausea Only    REACTION: nausea   Statins     Prior to Admission medications   Medication Sig Start Date End Date Taking? Authorizing Provider  bromocriptine (PARLODEL) 2.5 MG tablet TAKE ONE-HALF TABLET DAILY Patient taking differently: Take 2.5 mg by mouth daily. 02/14/20  Yes Loanne Drilling,  [place] Labrina Lines, MD  carbamazepine (TEGRETOL) 200 MG tablet TAKE 2 TABLETS AT BEDTIME Patient taking differently: Take 400 mg by mouth at bedtime. 11/07/20  Yes Biagio Borg, MD  Cholecalciferol (VITAMIN D3) 2000 UNITS TABS Take 2,000 Units by mouth every morning.    Yes [provider]  dapagliflozin propanediol (FARXIGA) 5 MG TABS tablet Take 1 tablet (5 mg total) by mouth daily before breakfast. 05/22/20  Yes Biagio Borg, MD  desoximetasone (TOPICORT) 0.25 % cream Apply 1 application topically 2 (two) times daily. 11/13/18  Yes Biagio Borg, MD  fluticasone (FLONASE) 50 MCG/ACT nasal spray  ONE SPRAY INTO NOSE EVERY DAY Patient taking differently: Place 1 spray into both nostrils daily. 01/12/18  Yes Biagio Borg, MD  glucose blood (ACCU-CHEK AVIVA PLUS) test strip Used to check blood sugars twice a day Dx E11.9 07/24/16  Yes Biagio Borg, MD  glucose blood test strip Use to check blood sugar 2 times per day. DX Code: E11.9 07/12/15  Yes Renato Shin, MD  hydrochlorothiazide (HYDRODIURIL) 25 MG tablet TAKE ONE TABLET DAILY Patient taking differently: Take 25 mg by mouth daily. 06/20/20  Yes Biagio Borg, MD  JANUVIA 100 MG tablet TAKE ONE-HALF TABLET DAILY Patient taking differently: Take 50 mg by mouth daily. 10/11/20  Yes Biagio Borg, MD  Lancets Misc. (ACCU-CHEK FASTCLIX LANCET) KIT AS DIRECTED 03/05/16  Yes Biagio Borg, MD  levothyroxine (SYNTHROID) 112 MCG tablet TAKE ONE TABLET EACH DAY Patient taking differently: Take 112 mcg by mouth daily before breakfast. 03/08/20  Yes Biagio Borg, MD  Multiple Vitamins-Minerals (ICAPS AREDS FORMULA PO) Take 1 capsule by mouth daily.   Yes [provider]  nateglinide (STARLIX) 60 MG tablet TAKE ONE-HALF TABLET 3 TIMES DAILY WITH MEALS Patient taking differently: Take 30 mg by mouth 2 (two) times daily. 08/04/20  Yes Biagio Borg, MD  olmesartan (BENICAR) 40 MG tablet TAKE ONE TABLET DAILY Patient taking differently: Take 40 mg by mouth daily. 06/20/20  Yes Biagio Borg, MD  Omega-3 Fatty Acids (FISH OIL) 1200 MG CAPS Take 1 capsule by mouth daily.   Yes [provider]  OVER THE COUNTER MEDICATION Take 1 tablet by mouth daily. Tumeric   Yes [provider]  pantoprazole (PROTONIX) 40 MG tablet TAKE ONE TABLET DAILY Patient taking differently: Take 40 mg by mouth daily. 10/03/20  Yes Biagio Borg, MD  PROAIR HFA 108 [phone] Base) MCG/ACT inhaler 2 PUFFS FOUR TIMES DAILY AS NEEDED Patient taking differently: Inhale 1 puff into the lungs every 6 (six) hours as needed. 05/25/18  Yes Biagio Borg, MD  tamsulosin  (FLOMAX) 0.4 MG CAPS capsule TAKE ONE CAPSULE EACH DAY Patient taking differently: Take 0.4 mg by mouth daily. 06/30/20  Yes Biagio Borg, MD  desonide (DESOWEN) 0.05 % cream Apply 1 application topically 2 (two) times daily. Patient not taking: No sig reported    [provider]  PROCTOSOL HC 2.5 % rectal cream APPLY TWICE DAILY RECTALLY Patient not taking: No sig reported 10/19/20   Biagio Borg, MD    Social History   Socioeconomic History   Marital status: Married    Spouse name: Not on file   Number of children: 3   Years of education: Not on file   Highest education level: Not on file  Occupational History   Occupation: retired  Tobacco Use   Smoking status: Former    Packs/day: 1.50    Years:  15.00    Pack years: 22.50    Types: Cigarettes, Pipe    Quit date: 06/23/1977    Years since quitting: 43.4   Smokeless tobacco: Never  Substance and Sexual Activity   Alcohol use: No    Alcohol/week: 0.0 standard drinks   Drug use: No   Sexual activity: Not on file  Other Topics Concern   Not on file  Social History Narrative   Not on file   Social Determinants of Health   Financial Resource Strain: Not on file  Food Insecurity: Not on file  Transportation Needs: Not on file  Physical Activity: Not on file  Stress: Not on file  Social Connections: Not on file  Intimate Partner Violence: Not on file     Family History  Problem Relation Age of Onset   Throat cancer Mother    Alcohol abuse Brother    Diabetes Brother    Drug abuse Daughter    Alcoholism Other        uncle   Breast cancer Father    Colon cancer Neg Hx    Colon polyps Neg Hx    Esophageal cancer Neg Hx    Rectal cancer Neg Hx    Stomach cancer Neg Hx     ROS: Otherwise negative unless mentioned in HPI  Physical Examination  Vitals:   11/09/20 0624 11/09/20 0747  BP: (!) 157/67 (!) 159/76  Pulse: 60 65  Resp: 16 18  Temp: 98.2 F (36.8 C) 98.1 F (36.7 C)  SpO2: 95% 96%    Body mass index is 35.87 kg/m.  General:  WDWN in NAD Gait: Normal HENT: WNL, normocephalic Pulmonary: normal non-labored breathing Cardiac: regular,  without carotid bruits Abdomen: obese, soft, NT/ND Vascular Exam/Pulses: 2+ radial pulses, 2+ DP pulses bilaterally. Moving all extremities without deficits Musculoskeletal: no muscle wasting or atrophy  Neurologic: A&O X 3;  CN intact. No weakness or paresthesias detected. speech is clear and coherent Psychiatric:  The pt has Normal affect.   CBC    Component Value Date/Time   WBC 7.8 11/08/2020 1617   RBC 4.14 (L) 11/08/2020 1617   HGB 12.4 (L) 11/08/2020 1617   HCT 39.7 11/08/2020 1617   PLT 231 11/08/2020 1617   MCV 95.9 11/08/2020 1617   MCH 30.0 11/08/2020 1617   MCHC 31.2 11/08/2020 1617   RDW 12.9 11/08/2020 1617   LYMPHSABS 1.9 11/08/2020 1617   MONOABS 0.7 11/08/2020 1617   EOSABS 0.4 11/08/2020 1617   BASOSABS 0.1 11/08/2020 1617    BMET    Component Value Date/Time   NA 138 11/08/2020 1617   K 5.0 11/08/2020 1617   CL 105 11/08/2020 1617   CO2 23 11/08/2020 1617   GLUCOSE 258 (H) 11/08/2020 1617   BUN 47 (H) 11/08/2020 1617   CREATININE 1.84 (H) 11/08/2020 1617   CALCIUM 9.4 11/08/2020 1617   GFRNONAA 37 (L) 11/08/2020 1617   GFRAA 69 (L) 06/17/2012 0428    COAGS: Lab Results  Component Value Date   INR 1.0 11/08/2020   INR 0.99 06/08/2012     Non-Invasive Vascular Imaging:      Right Carotid Findings:  +----------+-------+-------+--------+------------------------+-------------  ----+            PSV    EDV    StenosisPlaque Description      Comments                      cm/s   cm/s                                                       +----------+-------+-------+--------+------------------------+-------------  ----+  CCA Prox  85     7                                                           +----------+-------+-------+--------+------------------------+-------------  ----+  CCA Distal64     12                                                         +----------+-------+-------+--------+------------------------+-------------  ----+  ICA Prox  210    56     40-59%  heterogenous and        upper end of                                        irregular               range                +----------+-------+-------+--------+------------------------+-------------  ----+  ICA Mid   212    37                                                         +----------+-------+-------+--------+------------------------+-------------  ----+  ICA Distal205    36                                                         +----------+-------+-------+--------+------------------------+-------------  ----+  ECA       117    15             heterogenous                                +----------+-------+-------+--------+------------------------+-------------  ----+   +----------+--------+-------+----------------+-------------------+            PSV cm/sEDV cmsDescribe        Arm Pressure (mmHG)  +----------+--------+-------+----------------+-------------------+  [id number]            Multiphasic, WNL                     +----------+--------+-------+----------------+-------------------+   +---------+--------+--+--------+--+---------+  VertebralPSV cm/s57EDV cm/s22Antegrade  +---------+--------+--+--------+--+---------+       Left Carotid Findings:  +----------+--------+--------+--------+--------------------------+---------  +            PSV cm/sEDV cm/sStenosisPlaque Description        Comments    +----------+--------+--------+--------+--------------------------+---------  +  CCA Prox  120     27                                                     +----------+--------+--------+--------+--------------------------+---------  +  CCA Distal66      17                                                    +----------+--------+--------+--------+--------------------------+---------  +  ICA Prox  76      14              heterogenous and calcific  Shadowing  +----------+--------+--------+--------+--------------------------+---------  +  ICA Mid   358     158     80-99%  heterogenous and irregular            +----------+--------+--------+--------+--------------------------+---------  +  ICA Distal140     40                                                    +----------+--------+--------+--------+--------------------------+---------  +  ECA       123     16              heterogenous                          +----------+--------+--------+--------+--------------------------+---------  +   +----------+--------+--------+----------------+-------------------+            PSV cm/sEDV cm/sDescribe        Arm Pressure (mmHG)  +----------+--------+--------+----------------+-------------------+  Subclavian160             Multiphasic, WNL                     +----------+--------+--------+----------------+-------------------+   +---------+--------+--+--------+--+---------+  VertebralPSV cm/s70EDV cm/s12Antegrade  +---------+--------+--+--------+--+---------+     IMPRESSION: MRI HEAD IMPRESSION:   1. Three subcentimeter acute ischemic infarcts involving the cortical and subcortical aspect of the left cerebral hemisphere as above, likely embolic in nature. No associated hemorrhage or mass effect. 2. Underlying mild age-related cerebral atrophy with chronic small vessel ischemic disease.   MRA HEAD IMPRESSION:   1. Negative intracranial MRA for large vessel occlusion. 2. Intracranial atherosclerotic disease with associated severe stenoses involving the left V4 segment, left SCA, and  right A1 segment. Additional mild to moderate stenoses involving the mid left M1 and P2 segments.   MRA NECK IMPRESSION:   1. Eccentric atheromatous plaque about the proximal ICAs with associated stenoses of up to 75% on the left and 65% on the right. 2. Wide patency of both vertebral arteries within the neck. Right vertebral artery dominant.   Statin:  No. Beta Blocker:  No. Aspirin:  Yes.   ACEI:  No. ARB:  No. CCB use:  No Other antiplatelets/anticoagulants:  Yes.   Plavix   ASSESSMENT/PLAN: This is a 80 y.o. male who presents following amaurosis fugax in left eye. He was found to have 80-99% Left ICA stenosis on carotid duplex and MRA showing >75% left ICA stenosis with acute sub centimeter ischemic infarcts of the cortical and subcortical left cerebral hemisphere. His acute infarcts are likely embolic from the high grade lesion in the left ICA. The lesion appears very high on MRA. With his CKD stage III he did not undergo CTA for further evaluation. Recommendation would be to hydrate overnight and obtain CTA for further surgical planning. Discussed with patient, his wife and  daughter that he will need L TCAR vs CEA to prevent future stroke/ TIA - He is intolerant to statins. Zetia has been started  - He has been started on Aspirin and Plavix - Continue adequate blood pressure control  - Okay to eat from vascular standpoint - On call vascular surgeon, Dr. Carlis Abbott, will evaluate patient later today and provide further details regarding surgical planning and whether his intervention will be this admission or if he will be okay for discharge and schedule as outpatient    Karoline Caldwell PA-C [facility] [phone] 11/09/2020  11:23 AM  I have seen and evaluated the patient. I agree with the PA note as documented above.  80 year old male that vascular surgery has been consulted for carotid intervention on the left.  He had an episode of amaurosis in the left eye on  May 31.  Subsequently he was referred for carotid evaluation and had a duplex showing a greater than 80% left ICA stenosis and then sent to the ED.  MRI has confirmed left brain event with 3 subcentimeter acute ischemic infarcts in the left hemisphere.  I reviewed his MRA and appears to show a high-grade stenosis however this lesion appears fairly high in the mid ICA.  This was also correlated on ultrasound that showed his elevated velocity in mid ICA not the bifurcation.  I certainly think he needs a carotid intervention but my concern is given the distal extent of the lesion he may be better served with carotid stenting using a TCAR.  I understand he has an underlying CKD but I discussed with him and his wife about potential IV hydration tonight and a CTA of his neck tomorrow morning.  I will reach out to the hospitalist tonight.  Agree with dual antiplatelet therapy.  I discussed goal of surgical intervention is stroke risk reduction.  I think we can have a better plan after review of the CTA.  It is difficult to evaluate for TCAR and size carotid stent based on the MRA as I discussed with them tonight.     Marty Heck, MD [facility] [place] Office: [phone]

## 2020-11-09 NOTE — Progress Notes (Addendum)
STROKE TEAM PROGRESS NOTE    Interval History   No acute events overnight. Wife and daughter are at bedside; updated about clinical course, need for stroke work up and educated about carotid stenosis. Patient continues to complete stroke work up.    Pertinent Lab Work and Imaging    11/08/20 CT Head WO IV Contrast No acute CT finding. Mild age related volume loss and mild small vessel change of the hemispheric white matter.  11/08/20 MR Angio Head and Neck  MRA HEAD FINDINGS   ANTERIOR CIRCULATION: Visualized distal cervical segments of the internal carotid arteries are patent with antegrade flow. Petrous, cavernous, and supraclinoid segments patent without stenosis or other abnormality. Left A1 widely patent. Focal severe proximal right A1 stenosis (series 252, image 9). Normal anterior communicating artery complex. ACAs well perfused and patent distally.   Right M1 widely patent. Short-segment mild mid left M1 stenosis (series 252, image 9). Normal MCA bifurcations. Distal MCA branches well perfused and symmetric.   POSTERIOR CIRCULATION:  Dominant right V4 segment widely patent to the vertebrobasilar junction. Focal severe left V4 stenosis seen beyond the takeoff of the left PICA (series 255, image 1). Both PICA origins patent and normal. Basilar mildly irregular but is patent to its distal aspect without stenosis. Right superior cerebellar artery widely patent. Severe stenosis at the origin of the left superior cerebellar artery (series 255, image 1). Both PCAs primarily supplied via the basilar. Right PCA widely patent to its distal aspect. Mild-to-moderate stenosis at the mid left P2 segment (series 255, image 9).   No intracranial aneurysm.   MRA NECK FINDINGS AORTIC ARCH: Examination somewhat technically limited by lack of IV Contrast. Aortic arch and origin of the great vessels incompletely assessed on this exam.   RIGHT CAROTID SYSTEM: Visualized right CCA patent  to the bifurcation without stenosis. Eccentric atheromatous plaque at the proximal right ICA with associated stenosis of up to 65% by NASCET criteria (series 3, image 78). Right ICA patent distally without stenosis, evidence for dissection, or occlusion.   LEFT CAROTID SYSTEM: Visualized left CCA patent to the bifurcation without stenosis. Atheromatous plaque about the proximal left ICA with associated stenosis of up to approximately 75% by NASCET criteria (series 3, image 62). Left ICA patent distally without stenosis, dissection or occlusion.   VERTEBRAL ARTERIES: Both vertebral arteries arise from the subclavian arteries. Right vertebral artery dominant. Vertebral arteries patent within the neck without significant stenosis, evidence for dissection or occlusion.  11/08/20 MRI Brain WO IV Contrast There are 3 subcentimeter foci of restricted diffusion involving the cortical and subcortical aspect of the left cerebral hemisphere, involving the left frontal and parietal lobes (series 2, image 36) as well as the left occipital lobe (series 2, image 23). Findings are likely embolic. No associated hemorrhage or mass effect. No other evidence for acute or subacute ischemia. Gray-white matter differentiation otherwise maintained. No other areas of remote cortical infarction. No other evidence for acute or chronic intracranial hemorrhage.  11/08/20 VAS US Carotid  Right Carotid: Velocities in the right ICA are consistent with a 40-59% stenosis.  Left Carotid: Velocities in the left ICA are consistent with a 80-99% stenosis.   11/09/20 Echocardiogram Complete  Pending     Physical Examination   Constitutional: Calm, appropriate for condition  Cardiovascular: Normal RR Respiratory: No increased WOB   Mental status: AAOX4, following commands  Speech: Fluent with repetition and naming intact  Cranial nerves: EOMI, VFF, Face symmetric, Tongue midline   Motor: Normal  bulk and tone. No  drift.   Dlt Bic Tri FgS Grp HF  KnF KnE PIF DoF  R 5 5 5 5 5 5 5 5 5 5   L 5 5 5 5 5 5 5 5 5 5   Sensory: Intact to light touch throughout  Coordination: FNF + HTS intact  Reflexes: Deferred  Gait: Deferred   NIHSS: 0  Assessment and Plan   [name] is a 80 y.o. male w/pmh of allergies, DM2, DJD, GERD, HLD, HTN, hypothyroidism, OSA who presents with transient blurred vision of the left eye that resolved in 10-15 m. Further work up was done outpatient with CUS that showed severe LICA stenosis thus he was instructed to come to the hospital for further work up.   #Punctate L Frontal Parietal Occipital Stroke  #Amaurosis Fugax  Patient presented with the symptoms described above. At this time, he is completing stroke work up. MRI Brain showed small L Frontal Parietal Occipital Stroke. Echo results pending. MRA Head and Neck pertinent for stenosis of both proximal ICAs-75% on the left and 65 % on the right. VAS US Carotid;showed right carotid w/ 40 to 59 % stenosis, left carotid w/80 to 99 % stenosis. Stroke labs w/ LDL 134, A1C 7.1.Small strokes + amaurosis fugax are both in the setting of severe L carotid stenosis. Consulted vascular surgery for intervention on 11/09/20. He was placed on DAPT for secondary stroke prevention along with Zetia. Zetia was chosen vs statin given myalgias when attempting statins in the past.  - Continue Aspirin 81 mg/Plavix 75 mg + Zetia 10 mg for secondary stroke prevention - Vascular surgery consulted for intervention follow up intervention recommendations  - At discharge please place ambulatory referral to neurology for stroke follow up   #Hypertension He has a history of HTN and takes HCTZ 25 QD + Olmesartan 40 mg QD at home. Currently blood pressure is trending in the 150 to 170 range. Recommend permissive hypertension 48 hours post stroke and from there, gradually reduce the blood pressure, avoiding any acute drops especially given his severe LICA  stenosis.  - Resume home medications at half doses when initiating   #Hyperlipidemia From a stroke prevention stand point, the LDL goal is < 70. His LDL is 134, started zetia this admission given statin intolerance.  - Continue Zetia 10 mg QD   #Diabetes type II  Hemoglobin A1C this admission noted to be 7.1. Goal is <7, he is very close to goal. Recommend to continue the regimen he was on prior to admission at discharge and treat with SSI while inpatient.  - Treat hyperglycemia with SSI  Hospital day # 1  Ruta Hinds, NP  [facility] Nurse Practitioner Patient seen and discussed with attending physician Dr. Leonie Man  I have personally obtained history,examined this patient, reviewed notes, independently viewed imaging studies, participated in medical decision making and plan of care.ROS completed by me personally and pertinent positives fully documented  I have made any additions or clarifications directly to the above note. Agree with note above.  Patient presented with transient left face and hand paresthesias and weakness which has resolved and MRI scan shows no acute stroke.  There is likely a TIA due to small vessel disease.  Recommend aspirin and Plavix for 3 weeks followed by Plavix alone.  Aggressive risk factor modification.  Patient has statin intolerance and may need to have consideration for the PCSK9 inhibitor injections like Repatha or Praluent as an outpatient.  Follow-up as  an outpatient stroke clinic in 6 to 8 weeks.  Discussed with patient and Dr. Grandville Silos and answered questions.  Greater than 50% time during this 35-minute visit was spent on counseling and coordination of care about TIA and stroke prevention and answering questions.  Antony Contras, MD Medical Director [place] Pager: [phone] 11/09/2020 3:53 PM    To contact Stroke Continuity provider, please refer to [email or web address] After hours, contact General Neurology

## 2020-11-10 ENCOUNTER — Other Ambulatory Visit: Payer: Self-pay

## 2020-11-10 DIAGNOSIS — I6522 Occlusion and stenosis of left carotid artery: Secondary | ICD-10-CM

## 2020-11-10 DIAGNOSIS — N186 End stage renal disease: Secondary | ICD-10-CM

## 2020-11-10 LAB — BASIC METABOLIC PANEL
Anion gap: 9 (ref 5–15)
BUN: 39 mg/dL — ABNORMAL HIGH (ref 8–23)
CO2: 20 mmol/L — ABNORMAL LOW (ref 22–32)
Calcium: 8.7 mg/dL — ABNORMAL LOW (ref 8.9–10.3)
Chloride: 109 mmol/L (ref 98–111)
Creatinine, Ser: 1.59 mg/dL — ABNORMAL HIGH (ref 0.61–1.24)
GFR, Estimated: 44 mL/min — ABNORMAL LOW (ref >60)
Glucose, Bld: 141 mg/dL — ABNORMAL HIGH (ref 70–99)
Potassium: 4.8 mmol/L (ref 3.5–5.1)
Sodium: 138 mmol/L (ref 135–145)

## 2020-11-10 LAB — GLUCOSE, CAPILLARY
Glucose-Capillary: 146 mg/dL — ABNORMAL HIGH (ref 70–99)
Glucose-Capillary: 165 mg/dL — ABNORMAL HIGH (ref 70–99)

## 2020-11-10 LAB — CBC
HCT: 35.3 % — ABNORMAL LOW (ref 39.0–52.0)
Hemoglobin: 11.2 g/dL — ABNORMAL LOW (ref 13.0–17.0)
MCH: 30 pg (ref 26.0–34.0)
MCHC: 31.7 g/dL (ref 30.0–36.0)
MCV: 94.6 fL (ref 80.0–100.0)
Platelets: 212 10*3/uL (ref 150–400)
RBC: 3.73 MIL/uL — ABNORMAL LOW (ref 4.22–5.81)
RDW: 12.8 % (ref 11.5–15.5)
WBC: 7.5 10*3/uL (ref 4.0–10.5)
nRBC: 0 % (ref 0.0–0.2)

## 2020-11-10 LAB — MAGNESIUM: Magnesium: 1.9 mg/dL (ref 1.7–2.4)

## 2020-11-10 MED ORDER — ASPIRIN 81 MG PO CHEW: 81.0000 mg | CHEWABLE_TABLET | Freq: Every day | ORAL | 3 refills | Status: AC

## 2020-11-10 MED ORDER — EZETIMIBE 10 MG PO TABS: 10.0000 mg | ORAL_TABLET | Freq: Every day | ORAL | 2 refills | Status: DC

## 2020-11-10 MED ORDER — CLOPIDOGREL BISULFATE 75 MG PO TABS: 75.0000 mg | ORAL_TABLET | Freq: Every day | ORAL | 3 refills | Status: DC

## 2020-11-10 NOTE — Progress Notes (Signed)
NT wheeled pt off unit

## 2020-11-10 NOTE — Progress Notes (Signed)
Patient refused cpap last night due to intolerance to mask. RT aware, He sats 96-98% on RA asleep

## 2020-11-10 NOTE — Progress Notes (Signed)
[facility]  Subjective  -no additional neurologic events overnight.  Very concerned about CTA neck given contrast exposure for his underlying CKD.   Objective (!) 153/70 67 98.8 F (37.1 C) (Oral) 14 94%  Intake/Output Summary (Last 24 hours) at 11/10/2020 1249 Last data filed at 11/10/2020 0800 Gross per 24 hour  Intake 560 ml  Output 100 ml  Net 460 ml    Grossly neurologically intact  Laboratory Lab Results: Recent Labs    11/08/20 1617 11/10/20 0800  WBC 7.8 7.5  HGB 12.4* 11.2*  HCT 39.7 35.3*  PLT 231 212   BMET Recent Labs    11/08/20 1617 11/10/20 0800  NA 138 138  K 5.0 4.8  CL 105 109  CO2 23 20*  GLUCOSE 258* 141*  BUN 47* 39*  CREATININE 1.84* 1.59*  CALCIUM 9.4 8.7*    COAG Lab Results  Component Value Date   INR 1.0 11/08/2020   INR 0.99 06/08/2012   No results found for: PTT  Assessment/Planning:  80 year old male that vascular was consulted yesterday for symptomatic high-grade left carotid stenosis with left brain event.  I previously reviewed his MRA and it shows a high-grade stenosis in the mid ICA which is very distal and I think he would be better served with a left TCAR.  I had ordered a CTA neck today with IV hydration overnight but he is very concerned about contrast nephropathy with his underlying CKD which is reasonable.  I will use his MRA to size his carotid stent and then will do an Intra-Op angiogram as discussed with him today.  He can be discharged from my standpoint and will cancel CTA neck.  He is scheduled for on 11/20/20 for left TCAR with me in the OR.  He will need dual antiplatelet therapy with 81 mg aspirin and Plavix prescription at discharge.  He is statin intolerant.  Marty Heck 11/10/2020 12:49 PM --

## 2020-11-10 NOTE — Progress Notes (Signed)
SLP Cancellation Note  Patient Details Name: Joseph Villa MRN: [id number] DOB: 1941/01/13   Cancelled treatment:       Reason Eval/Treat Not Completed: Other (comment) (Pt has already been discharged from the facility.)  Tondra Reierson I. Hardin Negus, [place], [place] Office number [phone] Pager [place] 11/10/2020, 4:28 PM

## 2020-11-10 NOTE — Care Management Important Message (Signed)
Important Message  Patient Details  Name: Joseph Villa MRN: [id number] Date of Birth: 25-Nov-1940   Medicare Important Message Given:  Yes     [name] 11/10/2020, 11:25 AM

## 2020-11-10 NOTE — Care Management Important Message (Signed)
Important Message  Patient Details  Name: Joseph Villa MRN: [id number] Date of Birth: 18-Dec-1940   Medicare Important Message Given:  Yes     [name] 11/10/2020, 11:32 AM

## 2020-11-10 NOTE — Progress Notes (Addendum)
Patient was removed from CPAP not long after it was applied. Patient stated he could not tolerate the nasal mask due to feeling like it was going to suffocate him. Patient usually wears nasal pillows at home with his CPAP. Patient stated he would sleep without CPAP tonight but would call he need decided he needed it.

## 2020-11-10 NOTE — TOC Transition Note (Signed)
Transition of Care [facility]) - CM/SW Discharge Note   Patient Details  Name: Joseph Villa MRN: [id number] Date of Birth: 09-21-1940  [facility] CM/SW Contact:  Pollie Friar, RN Phone Number: 11/10/2020, 1:19 PM   Clinical Narrative:    Patient is discharging home with self care. No f/u per PT/OT and no DME needs.  Pt has transportation home.   Final next level of care: Home/Self Care Barriers to Discharge: No Barriers Identified   Patient Goals and CMS Choice        Discharge Placement                       Discharge Plan and Services                                     Social Determinants of Health (SDOH) Interventions     Readmission Risk Interventions No flowsheet data found.

## 2020-11-10 NOTE — Discharge Summary (Signed)
Physician Discharge Summary  Joseph Villa SFK:[id number] DOB: April 29, 1941 DOA: 11/08/2020  PCP: Biagio Borg, MD  Admit date: 11/08/2020 Discharge date: 11/10/2020  Admitted From: Home  Discharge disposition: Home   Recommendations for Outpatient Follow-Up:   Follow up with your primary care provider in one week.  Check CBC, BMP, magnesium in the next visit Follow-up with vascular surgery for surgical intervention as scheduled by the vascular surgery service.   Discharge Diagnosis:   Principal Problem:   Amaurosis fugax of left eye Active Problems:   Hypothyroidism   Obstructive sleep apnea   Hypertension associated with diabetes ([place])   Type 2 diabetes mellitus (HCC)   CKD (chronic kidney disease), stage III ([place])   Hyperlipidemia associated with type 2 diabetes mellitus ([place]) Significant left internal carotid artery stenosis  Discharge Condition: Improved.  Diet recommendation: Low sodium, heart healthy.  Carbohydrate-modified.    Wound care: None.  Code status: Full.   History of Present Illness:   Joseph Villa is a 80 y.o. male with medical history significant for diabetes mellitus type 2, hypertension, hyperlipidemia, chronic kidney disease stage III,  hypothyroidism, RBBB, and OSA on CPAP who presented to the hospital with transient left vision changes and blurriness of the medial half of the left visual field on 10/31/20 which occurred after he walked up 4 stairs and turned to the left into the house.  This lasted about 10-15 minutes before resolving on its own and he has not experienced recurrent symptoms since then.  He saw his ophthalmologist, Dr. Ellie Lunch, who was concerned about left-sided carotid artery blockage as the cause of his symptoms. Carotid Dopplers were ordered and completed earlier today (6/8).  Results were significant for left ICA stenosis 80-99%.  Right ICA stenosis 40-59% also noted.  Patient was advised to present to the ED for further  evaluation. Neurology was consulted who recommended admission for TIA stroke work-up.    Hospital Course:   Following conditions were addressed during hospitalization as listed below,  Acute ischemic infarcts of the left cerebral hemisphere and amaurosis fugax of left eye due to left ICA stenosis: No current symptoms.  Significant left ICA stenosis.  Vascular surgery was consulted.  Vascular surgery requested CT of the neck but due to CKD the patient and the family did not wish to pursue on it.  Continue aspirin Plavix.  On Zetia due to prior statin intolerance.  At this time, vascular surgery has recommended outpatient follow-up with on 11/20/2020 for possible carotid intervention.  Spoke with Dr. Carlis Abbott vascular surgery prior to discharge.   Type 2 diabetes mellitus: Resume Farxiga, Januvia, nateglinide, bromocriptine      Essential hypertension: Patient will be resumed on home medication including olmesartan, hydrochlorothiazide   CKD stage IIIb: Chronic and stable.    Hypothyroidism: TSH 6.54 on 10/23/2020.  Continue Synthroid.   Hyperlipidemia: On Zetia due to statin intolerance.   Asthma: Stable, continue albuterol as needed.   Mood disorder: On Tegretol   OSA: Continue CPAP nightly.   Disposition.  At this time, patient is stable for disposition home with outpatient vascular surgery and PCP follow-up.  Medical Consultants:   Neurology Vascular surgery  Procedures:    None Subjective:   Today, patient was seen and examined at bedside.  Patient denies any dizziness lightheadedness blurred vision.  Seen by vascular surgery.  Discharge Exam:   Vitals:   11/10/20 0727 11/10/20 1127  BP: (!) 149/69 (!) 153/70  Pulse: 63 67  Resp: 14  14  Temp: 98.2 F (36.8 C) 98.8 F (37.1 C)  SpO2: 94% 94%   Vitals:   11/10/20 0018 11/10/20 0526 11/10/20 0727 11/10/20 1127  BP: (!) 154/67 (!) 157/70 (!) 149/69 (!) 153/70  Pulse: 65 66 63 67  Resp: '16 16 14 14  ' Temp:  98.2 F (36.8 C) 98.4 F (36.9 C) 98.2 F (36.8 C) 98.8 F (37.1 C)  TempSrc: Oral Oral Oral Oral  SpO2: 97% 96% 94% 94%  Weight:      Height:        General: Alert awake, not in obvious distress, obese, hard of hearing HENT: pupils equally reacting to light,  No scleral pallor or icterus noted. Oral mucosa is moist.  Chest:  Clear breath sounds.  Diminished breath sounds bilaterally. No crackles or wheezes.  CVS: S1 &S2 heard. No murmur.  Regular rate and rhythm. Abdomen: Soft, nontender, nondistended.  Bowel sounds are heard.   Extremities: No cyanosis, clubbing or edema.  Peripheral pulses are palpable. Psych: Alert, awake and oriented, normal mood CNS:  No cranial nerve deficits.  Power equal in all extremities.   Skin: Warm and dry.  No rashes noted.  The results of significant diagnostics from this hospitalization (including imaging, microbiology, ancillary and laboratory) are listed below for reference.     Diagnostic Studies:   CT HEAD WO CONTRAST  Result Date: 11/08/2020 CLINICAL DATA:  Neurological deficit.  Acute stroke suspected. EXAM: CT HEAD WITHOUT CONTRAST TECHNIQUE: Contiguous axial images were obtained from the base of the skull through the vertex without intravenous contrast. COMPARISON:  None. FINDINGS: Brain: Age related volume loss. No focal abnormality affects the brainstem or cerebellum. Cerebral hemispheres show mild chronic small-vessel changes of the white matter. No cortical or large vessel territory infarction. No mass lesion, hemorrhage, hydrocephalus or extra-axial collection. Vascular: There is atherosclerotic calcification of the major vessels at the base of the brain. Skull: Negative Sinuses/Orbits: Clear/normal Other: None IMPRESSION: No acute CT finding. Mild age related volume loss and mild small vessel change of the hemispheric white matter. Electronically Signed   By: Nelson Chimes M.D.   On: 11/08/2020 18:50   MR ANGIO HEAD WO CONTRAST  Result  Date: 11/08/2020 CLINICAL DATA:  Initial evaluation for neuro deficit, stroke suspected. EXAM: MRI HEAD WITHOUT CONTRAST MRA HEAD WITHOUT CONTRAST MRA NECK WITHOUT CONTRAST TECHNIQUE: Multiplanar, multiecho pulse sequences of the brain and surrounding structures were obtained without intravenous contrast. Angiographic images of the Circle of Willis were obtained using MRA technique without intravenous contrast. Angiographic images of the neck were obtained using MRA technique without intravenous contrast. Carotid stenosis measurements (when applicable) are obtained utilizing NASCET criteria, using the distal internal carotid diameter as the denominator. COMPARISON:  Prior head CT from earlier the same day. FINDINGS: MRI HEAD FINDINGS Brain: Mild age-related cerebral atrophy. Patchy T2/FLAIR hyperintensity within the periventricular white matter most consistent with chronic small vessel ischemic disease, also mild for age. There are 3 subcentimeter foci of restricted diffusion involving the cortical and subcortical aspect of the left cerebral hemisphere, involving the left frontal and parietal lobes (series 2, image 36) as well as the left occipital lobe (series 2, image 23). Findings are likely embolic. No associated hemorrhage or mass effect. No other evidence for acute or subacute ischemia. Gray-white matter differentiation otherwise maintained. No other areas of remote cortical infarction. No other evidence for acute or chronic intracranial hemorrhage. No mass lesion, midline shift or mass effect. No hydrocephalus or extra-axial fluid collection. Pituitary  gland suprasellar region normal. Midline structures intact. Vascular: Major intracranial vascular flow voids are maintained. Skull and upper cervical spine: Craniocervical junction normal. Bone marrow signal intensity within normal limits. No scalp soft tissue abnormality. Sinuses/Orbits: Globes and orbital soft tissues within normal limits. Paranasal sinuses  are clear. No mastoid effusion. Inner ear structures within normal limits. Other: None. MRA HEAD FINDINGS ANTERIOR CIRCULATION: Visualized distal cervical segments of the internal carotid arteries are patent with antegrade flow. Petrous, cavernous, and supraclinoid segments patent without stenosis or other abnormality. Left A1 widely patent. Focal severe proximal right A1 stenosis (series 252, image 9). Normal anterior communicating artery complex. ACAs well perfused and patent distally. Right M1 widely patent. Short-segment mild mid left M1 stenosis (series 252, image 9). Normal MCA bifurcations. Distal MCA branches well perfused and symmetric. POSTERIOR CIRCULATION: Dominant right V4 segment widely patent to the vertebrobasilar junction. Focal severe left V4 stenosis seen beyond the takeoff of the left PICA (series 255, image 1). Both PICA origins patent and normal. Basilar mildly irregular but is patent to its distal aspect without stenosis. Right superior cerebellar artery widely patent. Severe stenosis at the origin of the left superior cerebellar artery (series 255, image 1). Both PCAs primarily supplied via the basilar. Right PCA widely patent to its distal aspect. Mild-to-moderate stenosis at the mid left P2 segment (series 255, image 9). No intracranial aneurysm. MRA NECK FINDINGS AORTIC ARCH: Examination somewhat technically limited by lack of IV contrast. Aortic arch and origin of the great vessels incompletely assessed on this exam. RIGHT CAROTID SYSTEM: Visualized right CCA patent to the bifurcation without stenosis. Eccentric atheromatous plaque at the proximal right ICA with associated stenosis of up to 65% by NASCET criteria (series 3, image 78). Right ICA patent distally without stenosis, evidence for dissection, or occlusion. LEFT CAROTID SYSTEM: Visualized left CCA patent to the bifurcation without stenosis. Atheromatous plaque about the proximal left ICA with associated stenosis of up to  approximately 75% by NASCET criteria (series 3, image 62). Left ICA patent distally without stenosis, dissection or occlusion. VERTEBRAL ARTERIES: Both vertebral arteries arise from the subclavian arteries. Right vertebral artery dominant. Vertebral arteries patent within the neck without significant stenosis, evidence for dissection or occlusion. IMPRESSION: MRI HEAD IMPRESSION: 1. Three subcentimeter acute ischemic infarcts involving the cortical and subcortical aspect of the left cerebral hemisphere as above, likely embolic in nature. No associated hemorrhage or mass effect. 2. Underlying mild age-related cerebral atrophy with chronic small vessel ischemic disease. MRA HEAD IMPRESSION: 1. Negative intracranial MRA for large vessel occlusion. 2. Intracranial atherosclerotic disease with associated severe stenoses involving the left V4 segment, left SCA, and right A1 segment. Additional mild to moderate stenoses involving the mid left M1 and P2 segments. MRA NECK IMPRESSION: 1. Eccentric atheromatous plaque about the proximal ICAs with associated stenoses of up to 75% on the left and 65% on the right. 2. Wide patency of both vertebral arteries within the neck. Right vertebral artery dominant. Electronically Signed   By: Jeannine Boga M.D.   On: 11/08/2020 20:14   MR ANGIO NECK WO CONTRAST  Result Date: 11/08/2020 CLINICAL DATA:  Initial evaluation for neuro deficit, stroke suspected. EXAM: MRI HEAD WITHOUT CONTRAST MRA HEAD WITHOUT CONTRAST MRA NECK WITHOUT CONTRAST TECHNIQUE: Multiplanar, multiecho pulse sequences of the brain and surrounding structures were obtained without intravenous contrast. Angiographic images of the Circle of Willis were obtained using MRA technique without intravenous contrast. Angiographic images of the neck were obtained using MRA technique without intravenous  contrast. Carotid stenosis measurements (when applicable) are obtained utilizing NASCET criteria, using the distal  internal carotid diameter as the denominator. COMPARISON:  Prior head CT from earlier the same day. FINDINGS: MRI HEAD FINDINGS Brain: Mild age-related cerebral atrophy. Patchy T2/FLAIR hyperintensity within the periventricular white matter most consistent with chronic small vessel ischemic disease, also mild for age. There are 3 subcentimeter foci of restricted diffusion involving the cortical and subcortical aspect of the left cerebral hemisphere, involving the left frontal and parietal lobes (series 2, image 36) as well as the left occipital lobe (series 2, image 23). Findings are likely embolic. No associated hemorrhage or mass effect. No other evidence for acute or subacute ischemia. Gray-white matter differentiation otherwise maintained. No other areas of remote cortical infarction. No other evidence for acute or chronic intracranial hemorrhage. No mass lesion, midline shift or mass effect. No hydrocephalus or extra-axial fluid collection. Pituitary gland suprasellar region normal. Midline structures intact. Vascular: Major intracranial vascular flow voids are maintained. Skull and upper cervical spine: Craniocervical junction normal. Bone marrow signal intensity within normal limits. No scalp soft tissue abnormality. Sinuses/Orbits: Globes and orbital soft tissues within normal limits. Paranasal sinuses are clear. No mastoid effusion. Inner ear structures within normal limits. Other: None. MRA HEAD FINDINGS ANTERIOR CIRCULATION: Visualized distal cervical segments of the internal carotid arteries are patent with antegrade flow. Petrous, cavernous, and supraclinoid segments patent without stenosis or other abnormality. Left A1 widely patent. Focal severe proximal right A1 stenosis (series 252, image 9). Normal anterior communicating artery complex. ACAs well perfused and patent distally. Right M1 widely patent. Short-segment mild mid left M1 stenosis (series 252, image 9). Normal MCA bifurcations. Distal MCA  branches well perfused and symmetric. POSTERIOR CIRCULATION: Dominant right V4 segment widely patent to the vertebrobasilar junction. Focal severe left V4 stenosis seen beyond the takeoff of the left PICA (series 255, image 1). Both PICA origins patent and normal. Basilar mildly irregular but is patent to its distal aspect without stenosis. Right superior cerebellar artery widely patent. Severe stenosis at the origin of the left superior cerebellar artery (series 255, image 1). Both PCAs primarily supplied via the basilar. Right PCA widely patent to its distal aspect. Mild-to-moderate stenosis at the mid left P2 segment (series 255, image 9). No intracranial aneurysm. MRA NECK FINDINGS AORTIC ARCH: Examination somewhat technically limited by lack of IV contrast. Aortic arch and origin of the great vessels incompletely assessed on this exam. RIGHT CAROTID SYSTEM: Visualized right CCA patent to the bifurcation without stenosis. Eccentric atheromatous plaque at the proximal right ICA with associated stenosis of up to 65% by NASCET criteria (series 3, image 78). Right ICA patent distally without stenosis, evidence for dissection, or occlusion. LEFT CAROTID SYSTEM: Visualized left CCA patent to the bifurcation without stenosis. Atheromatous plaque about the proximal left ICA with associated stenosis of up to approximately 75% by NASCET criteria (series 3, image 62). Left ICA patent distally without stenosis, dissection or occlusion. VERTEBRAL ARTERIES: Both vertebral arteries arise from the subclavian arteries. Right vertebral artery dominant. Vertebral arteries patent within the neck without significant stenosis, evidence for dissection or occlusion. IMPRESSION: MRI HEAD IMPRESSION: 1. Three subcentimeter acute ischemic infarcts involving the cortical and subcortical aspect of the left cerebral hemisphere as above, likely embolic in nature. No associated hemorrhage or mass effect. 2. Underlying mild age-related cerebral  atrophy with chronic small vessel ischemic disease. MRA HEAD IMPRESSION: 1. Negative intracranial MRA for large vessel occlusion. 2. Intracranial atherosclerotic disease with associated severe stenoses  involving the left V4 segment, left SCA, and right A1 segment. Additional mild to moderate stenoses involving the mid left M1 and P2 segments. MRA NECK IMPRESSION: 1. Eccentric atheromatous plaque about the proximal ICAs with associated stenoses of up to 75% on the left and 65% on the right. 2. Wide patency of both vertebral arteries within the neck. Right vertebral artery dominant. Electronically Signed   By: Jeannine Boga M.D.   On: 11/08/2020 20:14   MR Brain Wo Contrast (neuro protocol)  Result Date: 11/08/2020 CLINICAL DATA:  Initial evaluation for neuro deficit, stroke suspected. EXAM: MRI HEAD WITHOUT CONTRAST MRA HEAD WITHOUT CONTRAST MRA NECK WITHOUT CONTRAST TECHNIQUE: Multiplanar, multiecho pulse sequences of the brain and surrounding structures were obtained without intravenous contrast. Angiographic images of the Circle of Willis were obtained using MRA technique without intravenous contrast. Angiographic images of the neck were obtained using MRA technique without intravenous contrast. Carotid stenosis measurements (when applicable) are obtained utilizing NASCET criteria, using the distal internal carotid diameter as the denominator. COMPARISON:  Prior head CT from earlier the same day. FINDINGS: MRI HEAD FINDINGS Brain: Mild age-related cerebral atrophy. Patchy T2/FLAIR hyperintensity within the periventricular white matter most consistent with chronic small vessel ischemic disease, also mild for age. There are 3 subcentimeter foci of restricted diffusion involving the cortical and subcortical aspect of the left cerebral hemisphere, involving the left frontal and parietal lobes (series 2, image 36) as well as the left occipital lobe (series 2, image 23). Findings are likely embolic. No  associated hemorrhage or mass effect. No other evidence for acute or subacute ischemia. Gray-white matter differentiation otherwise maintained. No other areas of remote cortical infarction. No other evidence for acute or chronic intracranial hemorrhage. No mass lesion, midline shift or mass effect. No hydrocephalus or extra-axial fluid collection. Pituitary gland suprasellar region normal. Midline structures intact. Vascular: Major intracranial vascular flow voids are maintained. Skull and upper cervical spine: Craniocervical junction normal. Bone marrow signal intensity within normal limits. No scalp soft tissue abnormality. Sinuses/Orbits: Globes and orbital soft tissues within normal limits. Paranasal sinuses are clear. No mastoid effusion. Inner ear structures within normal limits. Other: None. MRA HEAD FINDINGS ANTERIOR CIRCULATION: Visualized distal cervical segments of the internal carotid arteries are patent with antegrade flow. Petrous, cavernous, and supraclinoid segments patent without stenosis or other abnormality. Left A1 widely patent. Focal severe proximal right A1 stenosis (series 252, image 9). Normal anterior communicating artery complex. ACAs well perfused and patent distally. Right M1 widely patent. Short-segment mild mid left M1 stenosis (series 252, image 9). Normal MCA bifurcations. Distal MCA branches well perfused and symmetric. POSTERIOR CIRCULATION: Dominant right V4 segment widely patent to the vertebrobasilar junction. Focal severe left V4 stenosis seen beyond the takeoff of the left PICA (series 255, image 1). Both PICA origins patent and normal. Basilar mildly irregular but is patent to its distal aspect without stenosis. Right superior cerebellar artery widely patent. Severe stenosis at the origin of the left superior cerebellar artery (series 255, image 1). Both PCAs primarily supplied via the basilar. Right PCA widely patent to its distal aspect. Mild-to-moderate stenosis at the mid  left P2 segment (series 255, image 9). No intracranial aneurysm. MRA NECK FINDINGS AORTIC ARCH: Examination somewhat technically limited by lack of IV contrast. Aortic arch and origin of the great vessels incompletely assessed on this exam. RIGHT CAROTID SYSTEM: Visualized right CCA patent to the bifurcation without stenosis. Eccentric atheromatous plaque at the proximal right ICA with associated stenosis of up  to 65% by NASCET criteria (series 3, image 78). Right ICA patent distally without stenosis, evidence for dissection, or occlusion. LEFT CAROTID SYSTEM: Visualized left CCA patent to the bifurcation without stenosis. Atheromatous plaque about the proximal left ICA with associated stenosis of up to approximately 75% by NASCET criteria (series 3, image 62). Left ICA patent distally without stenosis, dissection or occlusion. VERTEBRAL ARTERIES: Both vertebral arteries arise from the subclavian arteries. Right vertebral artery dominant. Vertebral arteries patent within the neck without significant stenosis, evidence for dissection or occlusion. IMPRESSION: MRI HEAD IMPRESSION: 1. Three subcentimeter acute ischemic infarcts involving the cortical and subcortical aspect of the left cerebral hemisphere as above, likely embolic in nature. No associated hemorrhage or mass effect. 2. Underlying mild age-related cerebral atrophy with chronic small vessel ischemic disease. MRA HEAD IMPRESSION: 1. Negative intracranial MRA for large vessel occlusion. 2. Intracranial atherosclerotic disease with associated severe stenoses involving the left V4 segment, left SCA, and right A1 segment. Additional mild to moderate stenoses involving the mid left M1 and P2 segments. MRA NECK IMPRESSION: 1. Eccentric atheromatous plaque about the proximal ICAs with associated stenoses of up to 75% on the left and 65% on the right. 2. Wide patency of both vertebral arteries within the neck. Right vertebral artery dominant. Electronically Signed    By: Jeannine Boga M.D.   On: 11/08/2020 20:14   ECHOCARDIOGRAM COMPLETE BUBBLE STUDY  Result Date: 11/09/2020    ECHOCARDIOGRAM REPORT   Patient Name:   Joseph Villa Date of Exam: 11/09/2020 Medical Rec #:  [id number]        Height:       70.0 in Accession #:    [id number]       Weight:       250.0 lb Date of Birth:  1941/02/11        BSA:          2.295 m Patient Age:    80 years         BP:           165/73 mmHg Patient Gender: M                HR:           65 bpm. Exam Location:  Inpatient Procedure: 2D Echo, Cardiac Doppler, Color Doppler and [place] Contrast Bubble            Study Indications:    Stroke  History:        Patient has no prior history of Echocardiogram examinations.                 Arrythmias:RBBB; Risk Factors:Hypertension, Dyslipidemia and                 Diabetes.  Sonographer:    Bernadene Person RDCS Referring Phys: [id number] [place]  1. Left ventricular ejection fraction, by estimation, is 60 to 65%. The left ventricle has normal function. The left ventricle has no regional wall motion abnormalities. Left ventricular diastolic parameters are consistent with Grade II diastolic dysfunction (pseudonormalization).  2. Right ventricular systolic function is normal. The right ventricular size is normal. Tricuspid regurgitation signal is inadequate for assessing PA pressure.  3. The mitral valve is normal in structure. No evidence of mitral valve regurgitation. No evidence of mitral stenosis.  4. The aortic valve is tricuspid. Aortic valve regurgitation is not visualized. Mild aortic valve sclerosis is present, with no evidence of aortic valve stenosis.  5. Aortic  dilatation noted. There is mild dilatation of the aortic root, measuring 42 mm. There is mild dilatation of the ascending aorta, measuring 44 mm.  6. The inferior vena cava is normal in size with greater than 50% respiratory variability, suggesting right atrial pressure of 3 mmHg.  7. Negative bubble  study. FINDINGS  Left Ventricle: Left ventricular ejection fraction, by estimation, is 60 to 65%. The left ventricle has normal function. The left ventricle has no regional wall motion abnormalities. The left ventricular internal cavity size was normal in size. There is  no left ventricular hypertrophy. Left ventricular diastolic parameters are consistent with Grade II diastolic dysfunction (pseudonormalization). Right Ventricle: The right ventricular size is normal. No increase in right ventricular wall thickness. Right ventricular systolic function is normal. Tricuspid regurgitation signal is inadequate for assessing PA pressure. Left Atrium: Left atrial size was normal in size. Right Atrium: Right atrial size was normal in size. Pericardium: Trivial pericardial effusion is present. Mitral Valve: The mitral valve is normal in structure. No evidence of mitral valve regurgitation. No evidence of mitral valve stenosis. Tricuspid Valve: The tricuspid valve is normal in structure. Tricuspid valve regurgitation is not demonstrated. Aortic Valve: The aortic valve is tricuspid. Aortic valve regurgitation is not visualized. Mild aortic valve sclerosis is present, with no evidence of aortic valve stenosis. Pulmonic Valve: The pulmonic valve was normal in structure. Pulmonic valve regurgitation is not visualized. Aorta: Aortic dilatation noted. There is mild dilatation of the aortic root, measuring 42 mm. There is mild dilatation of the ascending aorta, measuring 44 mm. Venous: The inferior vena cava is normal in size with greater than 50% respiratory variability, suggesting right atrial pressure of 3 mmHg. IAS/Shunts: Negative bubble study. Agitated saline contrast was given intravenously to evaluate for intracardiac shunting.  LEFT VENTRICLE PLAX 2D LVIDd:         4.80 cm  Diastology LVIDs:         2.80 cm  LV e' medial:    5.10 cm/s LV PW:         1.00 cm  LV E/e' medial:  13.0 LV IVS:        1.00 cm  LV e' lateral:    5.93 cm/s LVOT diam:     2.30 cm  LV E/e' lateral: 11.2 LV SV:         70 LV SV Index:   31 LVOT Area:     4.15 cm  RIGHT VENTRICLE RV S prime:     11.90 cm/s TAPSE (M-mode): 2.0 cm LEFT ATRIUM             Index       RIGHT ATRIUM           Index LA diam:        3.10 cm 1.35 cm/m  RA Area:     16.40 cm LA Vol (A2C):   49.4 ml 21.53 ml/m RA Volume:   37.80 ml  16.47 ml/m LA Vol (A4C):   52.6 ml 22.92 ml/m LA Biplane Vol: 54.1 ml 23.57 ml/m  AORTIC VALVE LVOT Vmax:   86.70 cm/s LVOT Vmean:  52.600 cm/s LVOT VTI:    0.169 m  AORTA Ao Root diam: 4.20 cm Ao Asc diam:  4.40 cm MITRAL VALVE MV Area (PHT): 3.48 cm    SHUNTS MV Decel Time: 218 msec    Systemic VTI:  0.17 m MV E velocity: 66.40 cm/s  Systemic Diam: 2.30 cm MV A velocity: 51.40 cm/s MV E/A  ratio:  1.29 Loralie Champagne MD Electronically signed by Loralie Champagne MD Signature Date/Time: 11/09/2020/3:30:48 PM    Final    VAS US CAROTID  Result Date: 11/08/2020 Carotid Arterial Duplex Study Patient Name:  Joseph Villa  Date of Exam:   11/08/2020 Medical Rec #: [id number]         Accession #:    [id number] Date of Birth: 16-Mar-1941         Patient Gender: M Patient Age:   79Y Exam Location:  [facility] Procedure:      VAS US CAROTID Referring Phys: Godfrey --------------------------------------------------------------------------------  Indications:       Carotid artery disease. Risk Factors:      Hypertension, hyperlipidemia, Diabetes. Other Factors:     Amaurosis fugax- visual disturbance of left eye peripheral                    field lasting 15 min. Comparison Study:  none Performing Technologist: June Leap RDMS, RVT  Examination Guidelines: A complete evaluation includes B-mode imaging, spectral Doppler, color Doppler, and power Doppler as needed of all accessible portions of each vessel. Bilateral testing is considered an integral part of a complete examination. Limited examinations for reoccurring indications may be  performed as noted.  Right Carotid Findings: +----------+-------+-------+--------+------------------------+-----------------+           PSV    EDV    StenosisPlaque Description      Comments                    cm/s   cm/s                                                     +----------+-------+-------+--------+------------------------+-----------------+ CCA Prox  85     7                                                        +----------+-------+-------+--------+------------------------+-----------------+ CCA Distal64     12                                                       +----------+-------+-------+--------+------------------------+-----------------+ ICA Prox  210    56     40-59%  heterogenous and        upper end of                                      irregular               range             +----------+-------+-------+--------+------------------------+-----------------+ ICA Mid   212    37                                                       +----------+-------+-------+--------+------------------------+-----------------+  ICA Distal205    36                                                       +----------+-------+-------+--------+------------------------+-----------------+ ECA       117    15             heterogenous                              +----------+-------+-------+--------+------------------------+-----------------+ +----------+--------+-------+----------------+-------------------+           PSV cm/sEDV cmsDescribe        Arm Pressure (mmHG) +----------+--------+-------+----------------+-------------------+ [id number]            Multiphasic, WNL                    +----------+--------+-------+----------------+-------------------+ +---------+--------+--+--------+--+---------+ VertebralPSV cm/s57EDV cm/s22Antegrade +---------+--------+--+--------+--+---------+  Left Carotid Findings:  +----------+--------+--------+--------+--------------------------+---------+           PSV cm/sEDV cm/sStenosisPlaque Description        Comments  +----------+--------+--------+--------+--------------------------+---------+ CCA Prox  120     27                                                  +----------+--------+--------+--------+--------------------------+---------+ CCA Distal66      17                                                  +----------+--------+--------+--------+--------------------------+---------+ ICA Prox  76      14              heterogenous and calcific Shadowing +----------+--------+--------+--------+--------------------------+---------+ ICA Mid   358     158     80-99%  heterogenous and irregular          +----------+--------+--------+--------+--------------------------+---------+ ICA Distal140     40                                                  +----------+--------+--------+--------+--------------------------+---------+ ECA       123     16              heterogenous                        +----------+--------+--------+--------+--------------------------+---------+ +----------+--------+--------+----------------+-------------------+           PSV cm/sEDV cm/sDescribe        Arm Pressure (mmHG) +----------+--------+--------+----------------+-------------------+ Subclavian160             Multiphasic, WNL                    +----------+--------+--------+----------------+-------------------+ +---------+--------+--+--------+--+---------+ VertebralPSV cm/s70EDV cm/s12Antegrade +---------+--------+--+--------+--+---------+    Findings reported to [facility] at 1:25pm. Summary: Right Carotid: Velocities in the right ICA are consistent with a 40-59%                stenosis. Left Carotid: Velocities in the  left ICA are consistent with a 80-99% stenosis.  *See table(s) above for measurements and observations.  Electronically signed by  Deitra Mayo MD on 11/08/2020 at 1:29:33 PM.    Final      Labs:   Basic Metabolic Panel: Recent Labs  Lab 11/08/20 1617 11/10/20 0800  NA 138 138  K 5.0 4.8  CL 105 109  CO2 23 20*  GLUCOSE 258* 141*  BUN 47* 39*  CREATININE 1.84* 1.59*  CALCIUM 9.4 8.7*  MG  --  1.9   GFR Estimated Creatinine Clearance: 49.1 mL/min (A) (by C-G formula based on SCr of 1.59 mg/dL (H)). Liver Function Tests: Recent Labs  Lab 11/08/20 1617  AST 15  ALT 19  ALKPHOS 75  BILITOT 0.5  PROT 7.1  ALBUMIN 3.5   No results for input(s): LIPASE, AMYLASE in the last 168 hours. No results for input(s): AMMONIA in the last 168 hours. Coagulation profile Recent Labs  Lab 11/08/20 1617  INR 1.0    CBC: Recent Labs  Lab 11/08/20 1617 11/10/20 0800  WBC 7.8 7.5  NEUTROABS 4.8  --   HGB 12.4* 11.2*  HCT 39.7 35.3*  MCV 95.9 94.6  PLT 231 212   Cardiac Enzymes: No results for input(s): CKTOTAL, CKMB, CKMBINDEX, TROPONINI in the last 168 hours. BNP: Invalid input(s): POCBNP CBG: Recent Labs  Lab 11/09/20 0607 11/09/20 1231 11/09/20 1649 11/10/20 0914 11/10/20 1159  GLUCAP 137* 174* 197* 146* 165*   D-Dimer No results for input(s): DDIMER in the last 72 hours. Hgb A1c Recent Labs    11/08/20 1816  HGBA1C 7.1*   Lipid Profile Recent Labs    11/08/20 1816  CHOL 237*  HDL 37*  LDLCALC 134*  TRIG 328*  CHOLHDL 6.4   Thyroid function studies No results for input(s): TSH, T4TOTAL, T3FREE, THYROIDAB in the last 72 hours.  Invalid input(s): FREET3 Anemia work up No results for input(s): VITAMINB12, FOLATE, FERRITIN, TIBC, IRON, RETICCTPCT in the last 72 hours. Microbiology Recent Results (from the past 240 hour(s))  SARS CORONAVIRUS 2 (TAT 6-24 HRS) Nasopharyngeal Nasopharyngeal Swab     Status: None   Collection Time: 11/08/20 11:21 PM   Specimen: Nasopharyngeal Swab  Result Value Ref Range Status   SARS Coronavirus 2 NEGATIVE NEGATIVE Final    Comment:  (NOTE) SARS-CoV-2 target nucleic acids are NOT DETECTED.  The SARS-CoV-2 RNA is generally detectable in upper and lower respiratory specimens during the acute phase of infection. Negative results do not preclude SARS-CoV-2 infection, do not rule out co-infections with other pathogens, and should not be used as the sole basis for treatment or other patient management decisions. Negative results must be combined with clinical observations, patient history, and epidemiological information. The expected result is Negative.  Fact Sheet for Patients: SugarRoll.be  Fact Sheet for Healthcare Providers: https://www.woods-mathews.com/  This test is not yet approved or cleared by the [place] FDA and  has been authorized for detection and/or diagnosis of SARS-CoV-2 by FDA under an Emergency Use Authorization (EUA). This EUA will remain  in effect (meaning this test can be used) for the duration of the COVID-19 declaration under Se ction 564(b)(1) of the Act, 21 U.S.C. section 360bbb-3(b)(1), unless the authorization is terminated or revoked sooner.  Performed at [facility], [place] [address] [place], [place] [address]   MRSA PCR Screening     Status: None   Collection Time: 11/09/20  7:36 AM   Specimen: Nasopharyngeal  Result Value Ref Range  Status   MRSA by PCR NEGATIVE NEGATIVE Final    Comment:        The GeneXpert MRSA Assay (FDA approved for NASAL specimens only), is one component of a comprehensive MRSA colonization surveillance program. It is not intended to diagnose MRSA infection nor to guide or monitor treatment for MRSA infections. Performed at [facility], [place] [address], [place], [place] [address]      Discharge Instructions:   Discharge Instructions     Diet - low sodium heart healthy   Complete by: As directed    Discharge instructions   Complete by: As directed    Follow-up with your primary care  physician as scheduled.  Follow-up with vascular surgery for surgical intervention on 11/20/2020.  You have been prescribed low-dose aspirin and Plavix which you will need to continue every day.  Continue rest of  home medications.   Increase activity slowly   Complete by: As directed       Allergies as of 11/10/2020       Reactions   Cefuroxime Axetil Anaphylaxis   REACTION: anaphylaxis-ceftin   Cefprozil    REACTION: unknown   Cephalosporins Nausea Only   Crestor [rosuvastatin Calcium]    Lipitor [atorvastatin]    myalgiaas   Metformin And Related    Rabeprazole Sodium Nausea Only   REACTION: nausea   Statins         Medication List     STOP taking these medications    desonide 0.05 % cream Commonly known as: DESOWEN   Proctosol HC 2.5 % rectal cream Generic drug: hydrocortisone       TAKE these medications    Accu-Chek FastClix Lancet Kit AS DIRECTED   aspirin 81 MG chewable tablet Chew 1 tablet (81 mg total) by mouth daily. Start taking on: November 11, 2020   bromocriptine 2.5 MG tablet Commonly known as: PARLODEL TAKE ONE-HALF TABLET DAILY What changed: how much to take   carbamazepine 200 MG tablet Commonly known as: TEGRETOL TAKE 2 TABLETS AT BEDTIME   clopidogrel 75 MG tablet Commonly known as: PLAVIX Take 1 tablet (75 mg total) by mouth daily. Start taking on: November 11, 2020   dapagliflozin propanediol 5 MG Tabs tablet Commonly known as: Farxiga Take 1 tablet (5 mg total) by mouth daily before breakfast.   desoximetasone 0.25 % cream Commonly known as: TOPICORT Apply 1 application topically 2 (two) times daily.   ezetimibe 10 MG tablet Commonly known as: ZETIA Take 1 tablet (10 mg total) by mouth daily. Start taking on: November 11, 2020   Fish Oil 1200 MG Caps Take 1 capsule by mouth daily.   fluticasone 50 MCG/ACT nasal spray Commonly known as: FLONASE ONE SPRAY INTO NOSE EVERY DAY What changed: See the new instructions.   glucose  blood test strip Use to check blood sugar 2 times per day. DX Code: E11.9   glucose blood test strip Commonly known as: Accu-Chek Aviva Plus Used to check blood sugars twice a day Dx E11.9   hydrochlorothiazide 25 MG tablet Commonly known as: HYDRODIURIL TAKE ONE TABLET DAILY   ICAPS AREDS FORMULA PO Take 1 capsule by mouth daily.   Januvia 100 MG tablet Generic drug: sitaGLIPtin TAKE ONE-HALF TABLET DAILY What changed: how much to take   levothyroxine 112 MCG tablet Commonly known as: SYNTHROID TAKE ONE TABLET EACH DAY What changed: See the new instructions.   nateglinide 60 MG tablet Commonly known as: STARLIX TAKE ONE-HALF TABLET 3  TIMES DAILY WITH MEALS What changed: See the new instructions.   olmesartan 40 MG tablet Commonly known as: BENICAR TAKE ONE TABLET DAILY   OVER THE COUNTER MEDICATION Take 1 tablet by mouth daily. Tumeric   pantoprazole 40 MG tablet Commonly known as: PROTONIX TAKE ONE TABLET DAILY   ProAir HFA 108 (90 Base) MCG/ACT inhaler Generic drug: albuterol 2 PUFFS FOUR TIMES DAILY AS NEEDED What changed: See the new instructions.   tamsulosin 0.4 MG Caps capsule Commonly known as: FLOMAX TAKE ONE CAPSULE EACH DAY What changed: See the new instructions.   Vitamin D3 50 MCG (2000 UT) Tabs Take 2,000 Units by mouth every morning.          Time coordinating discharge: 39 minutes  Signed:  Kaoir Loree  [facility] 11/10/2020, 1:05 PM

## 2020-11-11 ENCOUNTER — Other Ambulatory Visit: Payer: Self-pay | Admitting: Vascular Surgery

## 2020-11-11 MED ORDER — CLOPIDOGREL BISULFATE 75 MG PO TABS: 75.0000 mg | ORAL_TABLET | Freq: Every day | ORAL | 11 refills | Status: DC

## 2020-11-11 MED ORDER — EZETIMIBE 10 MG PO TABS: 10.0000 mg | ORAL_TABLET | Freq: Every day | ORAL | 11 refills | Status: DC

## 2020-11-13 ENCOUNTER — Telehealth: Payer: Self-pay | Admitting: Internal Medicine

## 2020-11-13 ENCOUNTER — Telehealth: Payer: Self-pay

## 2020-11-13 NOTE — Telephone Encounter (Signed)
Transition Care Management Follow-up Telephone Call Date of discharge and from where: 11/10/2020 from [facility] How have you been since you were released from the hospital? Doing pretty well Any questions or concerns? No  Items Reviewed: Did the pt receive and understand the discharge instructions provided? Yes  Medications obtained and verified? Yes  Other? No  Any new allergies since your discharge? No  Dietary orders reviewed? Yes; low sodium heart healthy diet Do you have support at home? Yes wife and daughter Investment banker, corporate)  [employer] and Equipment/Supplies: Were home health services ordered? no If so, what is the name of the agency? N/a  Has the agency set up a time to come to the patient's home? not applicable Were any new equipment or medical supplies ordered?  No What is the name of the medical supply agency? N/a Were you able to get the supplies/equipment? not applicable Do you have any questions related to the use of the equipment or supplies? No  Functional Questionnaire: (I = Independent and D = Dependent) ADLs: I  Bathing/Dressing- I  Meal Prep- I  Eating- I  Maintaining continence- I  Transferring/Ambulation- I  Managing Meds- I  Follow up appointments reviewed:  PCP Hospital f/u appt confirmed? Yes  Scheduled to see Cathlean Cower, MD on 11/24/2020 @ 10:00 am. [facility] f/u appt confirmed? Yes  Scheduled to see Monica Martinez, MD on 11/20/2020 for a surgical procedure. Are transportation arrangements needed? No  If their condition worsens, is the pt aware to call PCP or go to the Emergency Dept.? Yes Was the patient provided with contact information for the PCP's office or ED? Yes Was to pt encouraged to call back with questions or concerns? Yes

## 2020-11-13 NOTE — Telephone Encounter (Signed)
[facility] FYI- 6.10.22:  ---Caller states husband was discharged from hospital today. Was hospitalized for blocked carotid artery. Now has fever 102.0 oral, chills, and joint pain in right knee. Took a home COVID test and was negative. No surgical procedure during hospitalization.  Advised to go to ED now

## 2020-11-15 NOTE — Progress Notes (Signed)
Surgical Instructions    Your procedure is scheduled on Monday June 20th.  Report to [facility] Main Entrance "A" at 8 A.M., then check in with the Admitting office.  Call this number if you have problems the morning of surgery:  [phone]   If you have any questions prior to your surgery date call [phone]: Open Monday-Friday 8am-4pm    Remember:  Do not eat or drink anything after midnight the night before your surgery    Take these medicines the morning of surgery with A SIP OF WATER  aspirin 81 MG chewable tablet bromocriptine (PARLODEL) 2.5 MG tablet fluticasone (FLONASE) 50 MCG/ACT nasal spray levothyroxine (SYNTHROID) 112 MCG tablet pantoprazole (PROTONIX) 40 MG tablet tamsulosin (FLOMAX) 0.4 MG CAPS capsule  Stop Plavix 5 days prior to surgery, per [facility].  As of today, STOP taking any Aleve, Naproxen, Ibuprofen, Motrin, Advil, Goody's, BC's, all herbal medications, fish oil, and all vitamins.  WHAT DO I DO ABOUT MY DIABETES MEDICATION?   Do not take diabetic medicine Wilder Glade) the morning of surgery or the day before surgery. Do not take diabetic medicine (Januvia or Starlix) the morning of surgery.   HOW TO MANAGE YOUR DIABETES BEFORE AND AFTER SURGERY  Why is it important to control my blood sugar before and after surgery? Improving blood sugar levels before and after surgery helps healing and can limit problems. A way of improving blood sugar control is eating a healthy diet by:  Eating less sugar and carbohydrates  Increasing activity/exercise  Talking with your doctor about reaching your blood sugar goals High blood sugars (greater than 180 mg/dL) can raise your risk of infections and slow your recovery, so you will need to focus on controlling your diabetes during the weeks before surgery. Make sure that the doctor who takes care of your diabetes knows about your planned surgery including the date and location.  How do I manage my blood  sugar before surgery? Check your blood sugar at least 4 times a day, starting 2 days before surgery, to make sure that the level is not too high or low.  Check your blood sugar the morning of your surgery when you wake up and every 2 hours until you get to the Short Stay unit.  If your blood sugar is less than 70 mg/dL, you will need to treat for low blood sugar: Do not take insulin. Treat a low blood sugar (less than 70 mg/dL) with  cup of clear juice (cranberry or apple), 4 glucose tablets, OR glucose gel. Recheck blood sugar in 15 minutes after treatment (to make sure it is greater than 70 mg/dL). If your blood sugar is not greater than 70 mg/dL on recheck, call [phone] for further instructions. Report your blood sugar to the short stay nurse when you get to Short Stay.  If you are admitted to the hospital after surgery: Your blood sugar will be checked by the staff and you will probably be given insulin after surgery (instead of oral diabetes medicines) to make sure you have good blood sugar levels. The goal for blood sugar control after surgery is 80-180 mg/dL.           Do not wear jewelry  Do not wear lotions, powders, colognes, or deodorant. Do not shave 48 hours prior to surgery.  Men may shave face and neck. Do not bring valuables to the hospital. DO Not wear nail polish, gel polish, artificial nails, or any other type of covering on natural  nails including finger and toenails. If patients have artificial nails, gel coating, etc. that need to be removed by a nail salon please have this removed prior to surgery or surgery may need to be canceled/delayed if the surgeon/ anesthesia feels like the patient is unable to be adequately monitored.             [facility] is not responsible for any belongings or valuables.  Do NOT Smoke (Tobacco/Vaping) or drink Alcohol 24 hours prior to your procedure If you use a CPAP at night, you may bring all equipment for your overnight stay.    Contacts, glasses, dentures or bridgework may not be worn into surgery, please bring cases for these belongings   For patients admitted to the hospital, discharge time will be determined by your treatment team.   Patients discharged the day of surgery will not be allowed to drive home, and someone needs to stay with them for 24 hours.  ONLY 1 SUPPORT PERSON MAY BE PRESENT WHILE YOU ARE IN SURGERY. IF YOU ARE TO BE ADMITTED ONCE YOU ARE IN YOUR ROOM YOU WILL BE ALLOWED TWO (2) VISITORS.  Minor children may have two parents present. Special consideration for safety and communication needs will be reviewed on a case by case basis.  Special instructions:    Oral Hygiene is also important to reduce your risk of infection.  Remember - BRUSH YOUR TEETH THE MORNING OF SURGERY WITH YOUR REGULAR TOOTHPASTE   [facility]- Preparing For Surgery  Before surgery, you can play an important role. Because skin is not sterile, your skin needs to be as free of germs as possible. You can reduce the number of germs on your skin by washing with CHG (chlorahexidine gluconate) Soap before surgery.  CHG is an antiseptic cleaner which kills germs and bonds with the skin to continue killing germs even after washing.     Please do not use if you have an allergy to CHG or antibacterial soaps. If your skin becomes reddened/irritated stop using the CHG.  Do not shave (including legs and underarms) for at least 48 hours prior to first CHG shower. It is OK to shave your face.  Please follow these instructions carefully.     Shower the NIGHT BEFORE SURGERY and the MORNING OF SURGERY with CHG Soap.   If you chose to wash your hair, wash your hair first as usual with your normal shampoo. After you shampoo, rinse your hair and body thoroughly to remove the shampoo.  Then [employer] and genitals (private parts) with your normal soap and rinse thoroughly to remove soap.  After that Use CHG Soap as you would any other liquid  soap. You can apply CHG directly to the skin and wash gently with a scrungie or a clean washcloth.   Apply the CHG Soap to your body ONLY FROM THE NECK DOWN.  Do not use on open wounds or open sores. Avoid contact with your eyes, ears, mouth and genitals (private parts). Wash Face and genitals (private parts)  with your normal soap.   Wash thoroughly, paying special attention to the area where your surgery will be performed.  Thoroughly rinse your body with warm water from the neck down.  DO NOT shower/wash with your normal soap after using and rinsing off the CHG Soap.  Pat yourself dry with a CLEAN TOWEL.  Wear CLEAN PAJAMAS to bed the night before surgery  Place CLEAN SHEETS on your bed the night before your surgery  DO NOT SLEEP WITH PETS.   Day of Surgery:  Take a shower with CHG soap. Wear Clean/Comfortable clothing the morning of surgery Do not apply any deodorants/lotions.   Remember to brush your teeth WITH YOUR REGULAR TOOTHPASTE.   Please read over the following fact sheets that you were given.

## 2020-11-16 ENCOUNTER — Encounter (HOSPITAL_COMMUNITY)
Admission: RE | Admit: 2020-11-16 | Discharge: 2020-11-16 | Disposition: A | Discharge status: Home / Self Care | Payer: Medicare Other | Source: Ambulatory Visit | Attending: Vascular Surgery | Admitting: Vascular Surgery

## 2020-11-16 ENCOUNTER — Other Ambulatory Visit: Payer: Self-pay

## 2020-11-16 ENCOUNTER — Encounter (HOSPITAL_COMMUNITY): Payer: Self-pay

## 2020-11-16 DIAGNOSIS — Z20822 Contact with and (suspected) exposure to covid-19: Secondary | ICD-10-CM | POA: Insufficient documentation

## 2020-11-16 DIAGNOSIS — Z01812 Encounter for preprocedural laboratory examination: Secondary | ICD-10-CM | POA: Diagnosis not present

## 2020-11-16 HISTORY — DX: Personal history of urinary calculi: Z87.442

## 2020-11-16 HISTORY — DX: Cerebral infarction, unspecified: I63.9

## 2020-11-16 LAB — BLOOD GAS, ARTERIAL
Acid-base deficit: 4.7 mmol/L — ABNORMAL HIGH (ref 0.0–2.0)
Bicarbonate: 19.9 mmol/L — ABNORMAL LOW (ref 20.0–28.0)
Drawn by: 602861
FIO2: 21
O2 Saturation: 96.8 %
Patient temperature: 37
pCO2 arterial: 36.5 mmHg (ref 32.0–48.0)
pH, Arterial: 7.355 (ref 7.350–7.450)
pO2, Arterial: 94.9 mmHg (ref 83.0–108.0)

## 2020-11-16 LAB — COMPREHENSIVE METABOLIC PANEL
ALT: 32 U/L (ref 0–44)
AST: 20 U/L (ref 15–41)
Albumin: 3.3 g/dL — ABNORMAL LOW (ref 3.5–5.0)
Alkaline Phosphatase: 71 U/L (ref 38–126)
Anion gap: 12 (ref 5–15)
BUN: 43 mg/dL — ABNORMAL HIGH (ref 8–23)
CO2: 20 mmol/L — ABNORMAL LOW (ref 22–32)
Calcium: 9.4 mg/dL (ref 8.9–10.3)
Chloride: 105 mmol/L (ref 98–111)
Creatinine, Ser: 1.77 mg/dL — ABNORMAL HIGH (ref 0.61–1.24)
GFR, Estimated: 39 mL/min — ABNORMAL LOW (ref >60)
Glucose, Bld: 171 mg/dL — ABNORMAL HIGH (ref 70–99)
Potassium: 4.5 mmol/L (ref 3.5–5.1)
Sodium: 137 mmol/L (ref 135–145)
Total Bilirubin: 0.5 mg/dL (ref 0.3–1.2)
Total Protein: 7.1 g/dL (ref 6.5–8.1)

## 2020-11-16 LAB — URINALYSIS, ROUTINE W REFLEX MICROSCOPIC
Bacteria, UA: NONE SEEN
Bilirubin Urine: NEGATIVE
Glucose, UA: >=500 mg/dL — AB
Hgb urine dipstick: NEGATIVE
Ketones, ur: NEGATIVE mg/dL
Leukocytes,Ua: NEGATIVE
Nitrite: NEGATIVE
Protein, ur: NEGATIVE mg/dL
Specific Gravity, Urine: 1.014 (ref 1.005–1.030)
pH: 5 (ref 5.0–8.0)

## 2020-11-16 LAB — CBC
HCT: 36.8 % — ABNORMAL LOW (ref 39.0–52.0)
Hemoglobin: 11.8 g/dL — ABNORMAL LOW (ref 13.0–17.0)
MCH: 29.9 pg (ref 26.0–34.0)
MCHC: 32.1 g/dL (ref 30.0–36.0)
MCV: 93.4 fL (ref 80.0–100.0)
Platelets: 320 10*3/uL (ref 150–400)
RBC: 3.94 MIL/uL — ABNORMAL LOW (ref 4.22–5.81)
RDW: 12.7 % (ref 11.5–15.5)
WBC: 8.7 10*3/uL (ref 4.0–10.5)
nRBC: 0 % (ref 0.0–0.2)

## 2020-11-16 LAB — GLUCOSE, CAPILLARY: Glucose-Capillary: 129 mg/dL — ABNORMAL HIGH (ref 70–99)

## 2020-11-16 LAB — SURGICAL PCR SCREEN
MRSA, PCR: NEGATIVE
Staphylococcus aureus: NEGATIVE

## 2020-11-16 LAB — SARS CORONAVIRUS 2 (TAT 6-24 HRS): SARS Coronavirus 2: NEGATIVE

## 2020-11-16 LAB — APTT: aPTT: 30 seconds (ref 24–36)

## 2020-11-16 LAB — PROTIME-INR
INR: 1 (ref 0.8–1.2)
Prothrombin Time: 13.4 seconds (ref 11.4–15.2)

## 2020-11-16 NOTE — Progress Notes (Signed)
PCP - Dr. Cathlean Cower Cardiologist - Denies  Chest x-ray - Not indicated EKG - 11/08/20 Stress Test - 02/07/2017 ECHO - 11/09/2020 Cardiac Cath - Denies  Sleep Study - Yes has OSA CPAP - Nightly  DM - Type II CBG at PAT appt 129 had breakfast this am Fasting Blood Sugar - 140-148 Checks Blood Sugar daily  Blood Thinner Instructions:Plavix will stop today Aspirin Instructions: Take through day of surgery  COVID TEST- 11/16/20   Anesthesia review: no  Patient denies shortness of breath, fever, cough and chest pain at PAT appointment   All instructions explained to the patient, with a verbal understanding of the material. Patient agrees to go over the instructions while at home for a better understanding. Patient also instructed to wear a mask in public after being tested for COVID-19. The opportunity to ask questions was provided.

## 2020-11-17 ENCOUNTER — Other Ambulatory Visit (HOSPITAL_COMMUNITY): Payer: Medicare Other

## 2020-11-20 ENCOUNTER — Inpatient Hospital Stay (HOSPITAL_COMMUNITY): Payer: Medicare Other | Admitting: Anesthesiology

## 2020-11-20 ENCOUNTER — Encounter (HOSPITAL_COMMUNITY)
Admission: RE | Disposition: A | Discharge status: Home / Self Care | Payer: Self-pay | Source: Home / Self Care | Attending: Vascular Surgery

## 2020-11-20 ENCOUNTER — Encounter (HOSPITAL_COMMUNITY): Payer: Self-pay | Admitting: Vascular Surgery

## 2020-11-20 ENCOUNTER — Inpatient Hospital Stay (HOSPITAL_COMMUNITY)
Admission: RE | Admit: 2020-11-20 | Discharge: 2020-11-21 | DRG: 034 | Disposition: A | Discharge status: Home / Self Care | Payer: Medicare Other | Attending: Vascular Surgery | Admitting: Vascular Surgery

## 2020-11-20 ENCOUNTER — Inpatient Hospital Stay (HOSPITAL_COMMUNITY): Payer: Medicare Other

## 2020-11-20 ENCOUNTER — Other Ambulatory Visit: Payer: Self-pay

## 2020-11-20 DIAGNOSIS — E1122 Type 2 diabetes mellitus with diabetic chronic kidney disease: Secondary | ICD-10-CM | POA: Diagnosis present

## 2020-11-20 DIAGNOSIS — Z79899 Other long term (current) drug therapy: Secondary | ICD-10-CM | POA: Diagnosis not present

## 2020-11-20 DIAGNOSIS — Z811 Family history of alcohol abuse and dependence: Secondary | ICD-10-CM | POA: Diagnosis not present

## 2020-11-20 DIAGNOSIS — F17290 Nicotine dependence, other tobacco product, uncomplicated: Secondary | ICD-10-CM | POA: Diagnosis not present

## 2020-11-20 DIAGNOSIS — Z803 Family history of malignant neoplasm of breast: Secondary | ICD-10-CM | POA: Diagnosis not present

## 2020-11-20 DIAGNOSIS — I63232 Cerebral infarction due to unspecified occlusion or stenosis of left carotid arteries: Secondary | ICD-10-CM | POA: Diagnosis not present

## 2020-11-20 DIAGNOSIS — G4733 Obstructive sleep apnea (adult) (pediatric): Secondary | ICD-10-CM | POA: Diagnosis not present

## 2020-11-20 DIAGNOSIS — Z888 Allergy status to other drugs, medicaments and biological substances status: Secondary | ICD-10-CM

## 2020-11-20 DIAGNOSIS — N183 Chronic kidney disease, stage 3 unspecified: Secondary | ICD-10-CM | POA: Diagnosis not present

## 2020-11-20 DIAGNOSIS — Z96642 Presence of left artificial hip joint: Secondary | ICD-10-CM | POA: Diagnosis present

## 2020-11-20 DIAGNOSIS — G453 Amaurosis fugax: Secondary | ICD-10-CM | POA: Diagnosis present

## 2020-11-20 DIAGNOSIS — E785 Hyperlipidemia, unspecified: Secondary | ICD-10-CM | POA: Diagnosis not present

## 2020-11-20 DIAGNOSIS — Z808 Family history of malignant neoplasm of other organs or systems: Secondary | ICD-10-CM | POA: Diagnosis not present

## 2020-11-20 DIAGNOSIS — Z833 Family history of diabetes mellitus: Secondary | ICD-10-CM

## 2020-11-20 DIAGNOSIS — I672 Cerebral atherosclerosis: Secondary | ICD-10-CM | POA: Diagnosis not present

## 2020-11-20 DIAGNOSIS — Z8673 Personal history of transient ischemic attack (TIA), and cerebral infarction without residual deficits: Secondary | ICD-10-CM

## 2020-11-20 DIAGNOSIS — Z881 Allergy status to other antibiotic agents status: Secondary | ICD-10-CM | POA: Diagnosis not present

## 2020-11-20 DIAGNOSIS — H547 Unspecified visual loss: Secondary | ICD-10-CM | POA: Diagnosis not present

## 2020-11-20 DIAGNOSIS — I6522 Occlusion and stenosis of left carotid artery: Secondary | ICD-10-CM | POA: Diagnosis not present

## 2020-11-20 DIAGNOSIS — I639 Cerebral infarction, unspecified: Secondary | ICD-10-CM | POA: Diagnosis not present

## 2020-11-20 DIAGNOSIS — Z79890 Hormone replacement therapy (postmenopausal): Secondary | ICD-10-CM | POA: Diagnosis not present

## 2020-11-20 DIAGNOSIS — I4510 Unspecified right bundle-branch block: Secondary | ICD-10-CM | POA: Diagnosis not present

## 2020-11-20 DIAGNOSIS — M791 Myalgia, unspecified site: Secondary | ICD-10-CM | POA: Diagnosis present

## 2020-11-20 DIAGNOSIS — I129 Hypertensive chronic kidney disease with stage 1 through stage 4 chronic kidney disease, or unspecified chronic kidney disease: Secondary | ICD-10-CM | POA: Diagnosis present

## 2020-11-20 DIAGNOSIS — G720 Drug-induced myopathy: Secondary | ICD-10-CM

## 2020-11-20 DIAGNOSIS — E039 Hypothyroidism, unspecified: Secondary | ICD-10-CM | POA: Diagnosis not present

## 2020-11-20 DIAGNOSIS — Z7984 Long term (current) use of oral hypoglycemic drugs: Secondary | ICD-10-CM

## 2020-11-20 DIAGNOSIS — Z96649 Presence of unspecified artificial hip joint: Secondary | ICD-10-CM

## 2020-11-20 DIAGNOSIS — Z9989 Dependence on other enabling machines and devices: Secondary | ICD-10-CM | POA: Diagnosis not present

## 2020-11-20 HISTORY — PX: TRANSCAROTID ARTERY REVASCULARIZATIONÂ: SHX6778

## 2020-11-20 LAB — GLUCOSE, CAPILLARY
Glucose-Capillary: 181 mg/dL — ABNORMAL HIGH (ref 70–99)
Glucose-Capillary: 163 mg/dL — ABNORMAL HIGH (ref 70–99)
Glucose-Capillary: 165 mg/dL — ABNORMAL HIGH (ref 70–99)

## 2020-11-20 LAB — POCT ACTIVATED CLOTTING TIME: Activated Clotting Time: 283 seconds

## 2020-11-20 LAB — PLATELET MAPPING ADP AND AA
Activator F: >30 mm — ABNORMAL HIGH (ref 2–19)
Adenosine 5 Diphosphate (MA): 50.8 mm (ref 45–69)
Arachidonic Acid (MA): 43.5 mm — ABNORMAL LOW (ref 51–71)
Kaolin with Heparinase: >71 mm — ABNORMAL HIGH (ref 53–68)

## 2020-11-20 SURGERY — TRANSCAROTID ARTERY REVASCULARIZATION (TCAR)
Anesthesia: General | Laterality: Left

## 2020-11-20 MED ORDER — EPHEDRINE SULFATE-NACL 50-0.9 MG/10ML-% IV SOSY
PREFILLED_SYRINGE | INTRAVENOUS | Status: DC | PRN
  Administered 2020-11-20 (×2): 15 mg via INTRAVENOUS
  Administered 2020-11-20: 10 mg via INTRAVENOUS

## 2020-11-20 MED ORDER — HYDROCHLOROTHIAZIDE 25 MG PO TABS
25.0000 mg | ORAL_TABLET | Freq: Every day | ORAL | Status: DC
  Administered 2020-11-21: 25 mg via ORAL
  Filled 2020-11-20: qty 1

## 2020-11-20 MED ORDER — PROPOFOL 10 MG/ML IV BOLUS
INTRAVENOUS | Status: DC | PRN
  Administered 2020-11-20: 30 mg via INTRAVENOUS
  Administered 2020-11-20: 150 mg via INTRAVENOUS

## 2020-11-20 MED ORDER — CHLORHEXIDINE GLUCONATE CLOTH 2 % EX PADS: 6.0000 | MEDICATED_PAD | Freq: Once | CUTANEOUS | Status: DC

## 2020-11-20 MED ORDER — PANTOPRAZOLE SODIUM 40 MG PO TBEC
40.0000 mg | DELAYED_RELEASE_TABLET | Freq: Every day | ORAL | Status: DC
  Administered 2020-11-20 – 2020-11-21 (×2): 40 mg via ORAL
  Filled 2020-11-20 (×2): qty 1

## 2020-11-20 MED ORDER — GUAIFENESIN-DM 100-10 MG/5ML PO SYRP: 15.0000 mL | ORAL_SOLUTION | ORAL | Status: DC | PRN

## 2020-11-20 MED ORDER — GLYCOPYRROLATE PF 0.2 MG/ML IJ SOSY
PREFILLED_SYRINGE | INTRAMUSCULAR | Status: AC
  Filled 2020-11-20: qty 1

## 2020-11-20 MED ORDER — ACETAMINOPHEN 500 MG PO TABS
ORAL_TABLET | ORAL | Status: AC
  Filled 2020-11-20: qty 2

## 2020-11-20 MED ORDER — PROPOFOL 10 MG/ML IV BOLUS
INTRAVENOUS | Status: AC
  Filled 2020-11-20: qty 20

## 2020-11-20 MED ORDER — CLOPIDOGREL BISULFATE 75 MG PO TABS
ORAL_TABLET | ORAL | Status: AC
  Filled 2020-11-20: qty 1

## 2020-11-20 MED ORDER — DEXAMETHASONE SODIUM PHOSPHATE 10 MG/ML IJ SOLN
INTRAMUSCULAR | Status: DC | PRN
  Administered 2020-11-20: 10 mg via INTRAVENOUS

## 2020-11-20 MED ORDER — 0.9 % SODIUM CHLORIDE (POUR BTL) OPTIME
TOPICAL | Status: DC | PRN
  Administered 2020-11-20: 1000 mL

## 2020-11-20 MED ORDER — INSULIN ASPART 100 UNIT/ML IJ SOLN
0.0000 [IU] | Freq: Three times a day (TID) | INTRAMUSCULAR | Status: DC
  Administered 2020-11-21: 2 [IU] via SUBCUTANEOUS

## 2020-11-20 MED ORDER — POLYETHYLENE GLYCOL 3350 17 G PO PACK: 17.0000 g | PACK | Freq: Every day | ORAL | Status: DC | PRN

## 2020-11-20 MED ORDER — MORPHINE SULFATE (PF) 2 MG/ML IV SOLN: 2.0000 mg | INTRAVENOUS | Status: DC | PRN

## 2020-11-20 MED ORDER — PHENOL 1.4 % MT LIQD: 1.0000 | OROMUCOSAL | Status: DC | PRN

## 2020-11-20 MED ORDER — ACETAMINOPHEN 650 MG RE SUPP: 325.0000 mg | RECTAL | Status: DC | PRN

## 2020-11-20 MED ORDER — SODIUM CHLORIDE 0.9 % IV SOLN: INTRAVENOUS | Status: DC

## 2020-11-20 MED ORDER — HEMOSTATIC AGENTS (NO CHARGE) OPTIME
TOPICAL | Status: DC | PRN
  Administered 2020-11-20: 1 via TOPICAL

## 2020-11-20 MED ORDER — IODIXANOL 320 MG/ML IV SOLN
INTRAVENOUS | Status: DC | PRN
  Administered 2020-11-20: 30 mL

## 2020-11-20 MED ORDER — SODIUM CHLORIDE 0.9 % IV SOLN: 500.0000 mL | Freq: Once | INTRAVENOUS | Status: DC | PRN

## 2020-11-20 MED ORDER — SUGAMMADEX SODIUM 200 MG/2ML IV SOLN
INTRAVENOUS | Status: DC | PRN
  Administered 2020-11-20: 300 mg via INTRAVENOUS

## 2020-11-20 MED ORDER — PHENYLEPHRINE HCL-NACL 10-0.9 MG/250ML-% IV SOLN
INTRAVENOUS | Status: DC | PRN
  Administered 2020-11-20: 50 ug/min via INTRAVENOUS

## 2020-11-20 MED ORDER — SODIUM CHLORIDE 0.9 % IV SOLN
INTRAVENOUS | Status: AC
  Filled 2020-11-20: qty 1.2

## 2020-11-20 MED ORDER — LINAGLIPTIN 5 MG PO TABS
5.0000 mg | ORAL_TABLET | Freq: Every day | ORAL | Status: DC
  Administered 2020-11-21: 5 mg via ORAL
  Filled 2020-11-20: qty 1

## 2020-11-20 MED ORDER — ROCURONIUM BROMIDE 10 MG/ML (PF) SYRINGE
PREFILLED_SYRINGE | INTRAVENOUS | Status: DC | PRN
  Administered 2020-11-20: 20 mg via INTRAVENOUS
  Administered 2020-11-20 (×2): 50 mg via INTRAVENOUS

## 2020-11-20 MED ORDER — HYDRALAZINE HCL 20 MG/ML IJ SOLN: 5.0000 mg | INTRAMUSCULAR | Status: DC | PRN

## 2020-11-20 MED ORDER — BROMOCRIPTINE MESYLATE 2.5 MG PO TABS
1.2500 mg | ORAL_TABLET | Freq: Every day | ORAL | Status: DC
  Administered 2020-11-21: 1.25 mg via ORAL
  Filled 2020-11-20: qty 1

## 2020-11-20 MED ORDER — TAMSULOSIN HCL 0.4 MG PO CAPS
0.4000 mg | ORAL_CAPSULE | Freq: Every day | ORAL | Status: DC
  Administered 2020-11-21: 0.4 mg via ORAL
  Filled 2020-11-20: qty 1

## 2020-11-20 MED ORDER — PHENYLEPHRINE 40 MCG/ML (10ML) SYRINGE FOR IV PUSH (FOR BLOOD PRESSURE SUPPORT)
PREFILLED_SYRINGE | INTRAVENOUS | Status: DC | PRN
  Administered 2020-11-20: 200 ug via INTRAVENOUS
  Administered 2020-11-20: 120 ug via INTRAVENOUS
  Administered 2020-11-20: 200 ug via INTRAVENOUS

## 2020-11-20 MED ORDER — METOPROLOL TARTRATE 5 MG/5ML IV SOLN: 2.0000 mg | INTRAVENOUS | Status: DC | PRN

## 2020-11-20 MED ORDER — CARBAMAZEPINE 200 MG PO TABS
400.0000 mg | ORAL_TABLET | Freq: Every day | ORAL | Status: DC
  Administered 2020-11-20: 400 mg via ORAL
  Filled 2020-11-20: qty 2

## 2020-11-20 MED ORDER — ACETAMINOPHEN 500 MG PO TABS
1000.0000 mg | ORAL_TABLET | Freq: Once | ORAL | Status: AC
  Administered 2020-11-20: 1000 mg via ORAL

## 2020-11-20 MED ORDER — IRBESARTAN 300 MG PO TABS
300.0000 mg | ORAL_TABLET | Freq: Every day | ORAL | Status: DC
  Administered 2020-11-20 – 2020-11-21 (×2): 300 mg via ORAL
  Filled 2020-11-20 (×2): qty 1

## 2020-11-20 MED ORDER — FENTANYL CITRATE (PF) 100 MCG/2ML IJ SOLN: 25.0000 ug | INTRAMUSCULAR | Status: DC | PRN

## 2020-11-20 MED ORDER — SODIUM CHLORIDE 0.9 % IV SOLN
INTRAVENOUS | Status: DC | PRN
  Administered 2020-11-20: 500 mL

## 2020-11-20 MED ORDER — SUGAMMADEX SODIUM 500 MG/5ML IV SOLN
INTRAVENOUS | Status: AC
  Filled 2020-11-20: qty 5

## 2020-11-20 MED ORDER — CHLORHEXIDINE GLUCONATE CLOTH 2 % EX PADS
6.0000 | MEDICATED_PAD | Freq: Once | CUTANEOUS | Status: DC
  Administered 2020-11-20: 6 via TOPICAL

## 2020-11-20 MED ORDER — ORAL CARE MOUTH RINSE: 15.0000 mL | Freq: Once | OROMUCOSAL | Status: AC

## 2020-11-20 MED ORDER — CLOPIDOGREL BISULFATE 75 MG PO TABS
75.0000 mg | ORAL_TABLET | Freq: Every day | ORAL | Status: DC
  Administered 2020-11-21: 75 mg via ORAL
  Filled 2020-11-20: qty 1

## 2020-11-20 MED ORDER — CLOPIDOGREL BISULFATE 75 MG PO TABS
75.0000 mg | ORAL_TABLET | Freq: Once | ORAL | Status: AC
  Administered 2020-11-20: 75 mg via ORAL

## 2020-11-20 MED ORDER — FENTANYL CITRATE (PF) 250 MCG/5ML IJ SOLN
INTRAMUSCULAR | Status: DC | PRN
  Administered 2020-11-20: 200 ug via INTRAVENOUS
  Administered 2020-11-20: 50 ug via INTRAVENOUS

## 2020-11-20 MED ORDER — FENTANYL CITRATE (PF) 250 MCG/5ML IJ SOLN
INTRAMUSCULAR | Status: AC
  Filled 2020-11-20: qty 5

## 2020-11-20 MED ORDER — DAPAGLIFLOZIN PROPANEDIOL 5 MG PO TABS
5.0000 mg | ORAL_TABLET | Freq: Every day | ORAL | Status: DC
  Administered 2020-11-21: 5 mg via ORAL
  Filled 2020-11-20: qty 1

## 2020-11-20 MED ORDER — CHLORHEXIDINE GLUCONATE 0.12 % MT SOLN
OROMUCOSAL | Status: AC
  Filled 2020-11-20: qty 15

## 2020-11-20 MED ORDER — OXYCODONE-ACETAMINOPHEN 5-325 MG PO TABS: 1.0000 | ORAL_TABLET | ORAL | Status: DC | PRN

## 2020-11-20 MED ORDER — LABETALOL HCL 5 MG/ML IV SOLN
INTRAVENOUS | Status: DC | PRN
  Administered 2020-11-20: 10 mg via INTRAVENOUS
  Administered 2020-11-20: 5 mg via INTRAVENOUS

## 2020-11-20 MED ORDER — ALBUMIN HUMAN 5 % IV SOLN: INTRAVENOUS | Status: DC | PRN

## 2020-11-20 MED ORDER — DOCUSATE SODIUM 100 MG PO CAPS
100.0000 mg | ORAL_CAPSULE | Freq: Every day | ORAL | Status: DC
  Administered 2020-11-21: 100 mg via ORAL
  Filled 2020-11-20: qty 1

## 2020-11-20 MED ORDER — LIDOCAINE 2% (20 MG/ML) 5 ML SYRINGE
INTRAMUSCULAR | Status: DC | PRN
  Administered 2020-11-20: 20 mg via INTRAVENOUS

## 2020-11-20 MED ORDER — ACETAMINOPHEN 325 MG PO TABS: 325.0000 mg | ORAL_TABLET | ORAL | Status: DC | PRN

## 2020-11-20 MED ORDER — VANCOMYCIN HCL 1500 MG/300ML IV SOLN
1500.0000 mg | INTRAVENOUS | Status: AC
  Administered 2020-11-20: 1500 mg via INTRAVENOUS
  Filled 2020-11-20: qty 300

## 2020-11-20 MED ORDER — EPHEDRINE 5 MG/ML INJ
INTRAVENOUS | Status: AC
  Filled 2020-11-20: qty 10

## 2020-11-20 MED ORDER — HEPARIN SODIUM (PORCINE) 1000 UNIT/ML IJ SOLN
INTRAMUSCULAR | Status: AC
  Filled 2020-11-20: qty 1

## 2020-11-20 MED ORDER — LABETALOL HCL 5 MG/ML IV SOLN
10.0000 mg | INTRAVENOUS | Status: DC | PRN
Start: 2020-11-20 — End: 2020-11-21

## 2020-11-20 MED ORDER — BISACODYL 5 MG PO TBEC: 5.0000 mg | DELAYED_RELEASE_TABLET | Freq: Every day | ORAL | Status: DC | PRN

## 2020-11-20 MED ORDER — ONDANSETRON HCL 4 MG/2ML IJ SOLN
INTRAMUSCULAR | Status: DC | PRN
  Administered 2020-11-20: 4 mg via INTRAVENOUS

## 2020-11-20 MED ORDER — PHENYLEPHRINE 40 MCG/ML (10ML) SYRINGE FOR IV PUSH (FOR BLOOD PRESSURE SUPPORT)
PREFILLED_SYRINGE | INTRAVENOUS | Status: AC
  Filled 2020-11-20: qty 10

## 2020-11-20 MED ORDER — LACTATED RINGERS IV SOLN: INTRAVENOUS | Status: DC | PRN

## 2020-11-20 MED ORDER — ALUM & MAG HYDROXIDE-SIMETH 200-200-20 MG/5ML PO SUSP: 15.0000 mL | ORAL | Status: DC | PRN

## 2020-11-20 MED ORDER — EZETIMIBE 10 MG PO TABS
10.0000 mg | ORAL_TABLET | Freq: Every day | ORAL | Status: DC
  Administered 2020-11-21: 10 mg via ORAL
  Filled 2020-11-20: qty 1

## 2020-11-20 MED ORDER — ONDANSETRON HCL 4 MG/2ML IJ SOLN
INTRAMUSCULAR | Status: AC
  Filled 2020-11-20: qty 2

## 2020-11-20 MED ORDER — ONDANSETRON HCL 4 MG/2ML IJ SOLN: 4.0000 mg | Freq: Four times a day (QID) | INTRAMUSCULAR | Status: DC | PRN

## 2020-11-20 MED ORDER — ASPIRIN 81 MG PO CHEW
81.0000 mg | CHEWABLE_TABLET | Freq: Every day | ORAL | Status: DC
  Administered 2020-11-21: 81 mg via ORAL
  Filled 2020-11-20: qty 1

## 2020-11-20 MED ORDER — PROTAMINE SULFATE 10 MG/ML IV SOLN
INTRAVENOUS | Status: AC
  Filled 2020-11-20: qty 5

## 2020-11-20 MED ORDER — DEXAMETHASONE SODIUM PHOSPHATE 10 MG/ML IJ SOLN
INTRAMUSCULAR | Status: AC
  Filled 2020-11-20: qty 1

## 2020-11-20 MED ORDER — PROTAMINE SULFATE 10 MG/ML IV SOLN
INTRAVENOUS | Status: DC | PRN
  Administered 2020-11-20: 30 mg via INTRAVENOUS
  Administered 2020-11-20: 20 mg via INTRAVENOUS

## 2020-11-20 MED ORDER — LEVOTHYROXINE SODIUM 112 MCG PO TABS
112.0000 ug | ORAL_TABLET | Freq: Every day | ORAL | Status: DC
  Administered 2020-11-21: 112 ug via ORAL
  Filled 2020-11-20: qty 1

## 2020-11-20 MED ORDER — NATEGLINIDE 60 MG PO TABS
30.0000 mg | ORAL_TABLET | Freq: Three times a day (TID) | ORAL | Status: DC
  Administered 2020-11-21: 30 mg via ORAL
  Filled 2020-11-20 (×2): qty 0.5

## 2020-11-20 MED ORDER — ROCURONIUM BROMIDE 10 MG/ML (PF) SYRINGE
PREFILLED_SYRINGE | INTRAVENOUS | Status: AC
  Filled 2020-11-20: qty 10

## 2020-11-20 MED ORDER — CHLORHEXIDINE GLUCONATE 0.12 % MT SOLN
15.0000 mL | Freq: Once | OROMUCOSAL | Status: AC
  Administered 2020-11-20: 15 mL via OROMUCOSAL

## 2020-11-20 MED ORDER — POTASSIUM CHLORIDE CRYS ER 20 MEQ PO TBCR: 20.0000 meq | EXTENDED_RELEASE_TABLET | Freq: Every day | ORAL | Status: DC | PRN

## 2020-11-20 MED ORDER — GLYCOPYRROLATE 0.2 MG/ML IJ SOLN
INTRAMUSCULAR | Status: DC | PRN
  Administered 2020-11-20 (×2): .1 mg via INTRAVENOUS

## 2020-11-20 MED ORDER — MAGNESIUM SULFATE 2 GM/50ML IV SOLN: 2.0000 g | Freq: Every day | INTRAVENOUS | Status: DC | PRN

## 2020-11-20 SURGICAL SUPPLY — 57 items
BAG BANDED W/RUBBER/TAPE 36X54 (MISCELLANEOUS) ×2 IMPLANT
BALLN STERLING RX 5X30X80 (BALLOONS) ×2
BALLN STERLING RX 6X30X80 (BALLOONS) ×2
BALLOON STERLING RX 5X30X80 (BALLOONS) ×1 IMPLANT
BALLOON STERLING RX 6X30X80 (BALLOONS) ×1 IMPLANT
CANISTER SUCT 3000ML PPV (MISCELLANEOUS) ×2 IMPLANT
CATH ROBINSON RED A/P 18FR (CATHETERS) IMPLANT
CLIP VESOCCLUDE MED 6/CT (CLIP) ×2 IMPLANT
CLIP VESOCCLUDE SM WIDE 6/CT (CLIP) ×2 IMPLANT
COVER DOME SNAP 22 D (MISCELLANEOUS) ×2 IMPLANT
COVER PROBE W GEL 5X96 (DRAPES) ×2 IMPLANT
COVER WAND RF STERILE (DRAPES) IMPLANT
DERMABOND ADVANCED (GAUZE/BANDAGES/DRESSINGS) ×2
DERMABOND ADVANCED .7 DNX12 (GAUZE/BANDAGES/DRESSINGS) ×2 IMPLANT
DRAPE FEMORAL ANGIO 80X135IN (DRAPES) ×2 IMPLANT
ELECT REM PT RETURN 9FT ADLT (ELECTROSURGICAL) ×2
ELECTRODE REM PT RTRN 9FT ADLT (ELECTROSURGICAL) ×1 IMPLANT
GLOVE BIO SURGEON STRL SZ7.5 (GLOVE) ×2 IMPLANT
GOWN STRL REUS W/ TWL LRG LVL3 (GOWN DISPOSABLE) ×2 IMPLANT
GOWN STRL REUS W/ TWL XL LVL3 (GOWN DISPOSABLE) ×1 IMPLANT
GOWN STRL REUS W/TWL LRG LVL3 (GOWN DISPOSABLE) ×4
GOWN STRL REUS W/TWL XL LVL3 (GOWN DISPOSABLE) ×2
HEMOSTAT SNOW SURGICEL 2X4 (HEMOSTASIS) ×2 IMPLANT
INTRODUCER KIT GALT 7CM (INTRODUCER) ×2
KIT BASIN OR (CUSTOM PROCEDURE TRAY) ×2 IMPLANT
KIT ENCORE 26 ADVANTAGE (KITS) ×2 IMPLANT
KIT INTRODUCER GALT 7 (INTRODUCER) ×1 IMPLANT
KIT MICROPUNCTURE NIT STIFF (SHEATH) ×2 IMPLANT
KIT TURNOVER KIT B (KITS) ×2 IMPLANT
NEEDLE HYPO 25GX1X1/2 BEV (NEEDLE) IMPLANT
PACK CAROTID (CUSTOM PROCEDURE TRAY) ×2 IMPLANT
POSITIONER HEAD DONUT 9IN (MISCELLANEOUS) ×2 IMPLANT
PROTECTION STATION PRESSURIZED (MISCELLANEOUS) ×2
SET MICROPUNCTURE 5F STIFF (MISCELLANEOUS) ×2 IMPLANT
SHEATH PINNACLE 5FR (SHEATH) IMPLANT
SHUNT CAROTID BYPASS 10 (VASCULAR PRODUCTS) IMPLANT
SHUNT CAROTID BYPASS 12FRX15.5 (VASCULAR PRODUCTS) IMPLANT
STATION PROTECTION PRESSURIZED (MISCELLANEOUS) ×1 IMPLANT
STENT TRANSCAROTID SYSTEM 9X40 (Permanent Stent) ×2 IMPLANT
SUT MNCRL AB 4-0 PS2 18 (SUTURE) ×2 IMPLANT
SUT PROLENE 5 0 C 1 24 (SUTURE) ×2 IMPLANT
SUT PROLENE 6 0 BV (SUTURE) IMPLANT
SUT PROLENE 7 0 BV 1 (SUTURE) IMPLANT
SUT SILK 2 0 PERMA HAND 18 BK (SUTURE) IMPLANT
SUT SILK 2 0 SH CR/8 (SUTURE) ×2 IMPLANT
SUT SILK 3 0 (SUTURE)
SUT SILK 3-0 18XBRD TIE 12 (SUTURE) IMPLANT
SUT VIC AB 3-0 SH 27 (SUTURE) ×2
SUT VIC AB 3-0 SH 27X BRD (SUTURE) ×1 IMPLANT
SYR 10ML LL (SYRINGE) ×6 IMPLANT
SYR 20ML LL LF (SYRINGE) ×2 IMPLANT
SYR CONTROL 10ML LL (SYRINGE) IMPLANT
SYSTEM TRANSCAROTID NEUROPRTCT (MISCELLANEOUS) ×1 IMPLANT
TOWEL GREEN STERILE (TOWEL DISPOSABLE) ×2 IMPLANT
TRANSCAROTID NEUROPROTECT SYS (MISCELLANEOUS) ×2
WATER STERILE IRR 1000ML POUR (IV SOLUTION) ×2 IMPLANT
WIRE BENTSON .035X145CM (WIRE) ×2 IMPLANT

## 2020-11-20 NOTE — Anesthesia Postprocedure Evaluation (Signed)
Anesthesia Post Note  Patient: DAEGON DEISS  Procedure(s) Performed: LEFT TRANSCAROTID ARTERY REVASCULARIZATION (Left)     Patient location during evaluation: PACU Anesthesia Type: General Level of consciousness: awake and alert, patient cooperative and oriented Pain management: pain level controlled Vital Signs Assessment: post-procedure vital signs reviewed and stable Respiratory status: spontaneous breathing, nonlabored ventilation and respiratory function stable Cardiovascular status: blood pressure returned to baseline and stable Postop Assessment: no apparent nausea or vomiting Anesthetic complications: no   No notable events documented.  Last Vitals:  Vitals:   11/20/20 1215 11/20/20 1230  BP: 123/63 124/64  Pulse: 70 70  Resp: 17 (!) 21  Temp: 36.4 C   SpO2: 95% 93%    Last Pain:  Vitals:   11/20/20 1215  TempSrc:   PainSc: 0-No pain                 Lemuel Boodram,E. Nisaiah Bechtol

## 2020-11-20 NOTE — Progress Notes (Signed)
  Day of Surgery Note    Subjective:  Seen in PACU. No pain. Dry mouth and nose.   Vitals:   11/20/20 1445 11/20/20 1500  BP: 135/69 138/66  Pulse: 72 69  Resp: 15 14  Temp:    SpO2: 93% 93%   General appearance: Awake, alert in no apparent distress Neurologic: Alert and oriented x4, tongue midline, face symmetric, grip strength 5/5 bilaterally Cardiac: Heart rate and rhythm are regular Respirations: Nonlabored Incision: Well approximated without bleeding or hematoma.  Subcutaneous tissues soft to palpation Groin puncture site: Soft without hematoma or bleeding   Assessment/Plan:  This is a 80 y.o. male who is s/p transcatheter placement of intravascular stent in the left cervical carotid artery including angioplasty with distal embolic protection (left TCAR) due to symptomatic high-grade left internal carotid artery stenosis (>80%) with left brain stroke exhibited by amaurosis fugax left eye.   He reports no residual vision problems prior to surgery.  -Awaiting transfer to [facility], PA-C 11/20/2020 3:55 PM [phone]

## 2020-11-20 NOTE — Anesthesia Preprocedure Evaluation (Addendum)
Anesthesia Evaluation  Patient identified by MRN, date of birth, ID band Patient awake    Reviewed: Allergy & Precautions, NPO status , Patient's Chart, lab work & pertinent test results  History of Anesthesia Complications Negative for: history of anesthetic complications  Airway Mallampati: IV  TM Distance: >3 FB Neck ROM: Full    Dental  (+) Dental Advisory Given   Pulmonary sleep apnea and Continuous Positive Airway Pressure Ventilation , former smoker,    breath sounds clear to auscultation       Cardiovascular hypertension, Pt. on medications (-) angina Rhythm:Regular Rate:Normal  11/09/2020 ECHO: EF 60-65%, normal LVF,, normal wall motion, Grade 2 DD, no significant valvular abnormalities   Neuro/Psych Anxiety Bipolar Disorder CVA, No Residual Symptoms    GI/Hepatic Neg liver ROS, GERD  Medicated and Controlled,  Endo/Other  diabetes (glu 181), Oral Hypoglycemic AgentsHypothyroidism Morbid obesity  Renal/GU Renal InsufficiencyRenal disease (creat 1.77)     Musculoskeletal  (+) Arthritis , Osteoarthritis,    Abdominal (+) + obese,   Peds  Hematology plavix   Anesthesia Other Findings   Reproductive/Obstetrics                            Anesthesia Physical Anesthesia Plan  ASA: 3  Anesthesia Plan: General   Post-op Pain Management:    Induction: Intravenous  PONV Risk Score and Plan: 2 and Ondansetron and Dexamethasone  Airway Management Planned: Oral ETT and Video Laryngoscope Planned  Additional Equipment: Arterial line  Intra-op Plan:   Post-operative Plan: Extubation in OR  Informed Consent: I have reviewed the patients History and Physical, chart, labs and discussed the procedure including the risks, benefits and alternatives for the proposed anesthesia with the patient or authorized representative who has indicated his/her understanding and acceptance.     Dental  advisory given  Plan Discussed with: CRNA and Surgeon  Anesthesia Plan Comments:        Anesthesia Quick Evaluation

## 2020-11-20 NOTE — Op Note (Signed)
Date: November 20, 2020   Preoperative diagnosis: Symptomatic high-grade left internal carotid artery stenosis (>80%) with left brain stroke   Postoperative diagnosis: Same   Procedure: 1.  Ultrasound-guided access of right common femoral vein with placement of venous sheath for flow reversal and distal embolic protection 2.  Transcatheter placement of intravascular stent in the left cervical carotid artery including angioplasty with distal embolic protection (left TCAR)   Surgeon: Dr. Marty Heck, MD   Assistant: Risa Grill, PA   Indications: Patient is a 80 year old male that was recently seen in consultation with high grade left ICA stenosis and left brain stroke.  Ultimately his lesion was in the mid ICA and this was felt to be high risk surgical lesion given distal extent of involvement.  We recommended left TCAR with carotid stent placement.  He presents today after risk benefits discussed.     Findings: Right femoral sheath was placed first.  Then transverse incision was made above the left clavicle with cutdown on the left common carotid artery and ultimately placement of arterial sheath with flow reversal and distal embolic protection.  Left internal carotid lesion appeared greater than 80% stenosis in the mid ICA.  Lesion was predilated with a 5 mm x 30 mm angioplasty balloon and then stented with a 9 mm x 40 mm Enroute stent.  There was initially >30% residual stenosis in the stent and we did post-dilate the stent with a 6 mm x 30 mm angioplasty balloon with no residual stenosis.  Total flow reversal time was 13 minutes.  An assistant was needed for exposure and to expedite the case.   Anesthesia: General   Details: Patient was taken to the operating room after informed consent was obtained.  Placed on the operative table in supine position.  General endotracheal anesthesia was induced.  Ultimately a bump was placed under his neck and his head was turned to the right.  I did  mark the carotid artery just above the clavicle with ultrasound.  The left neck and both groins were prepped and draped usual sterile fashion.  He got preoperative antibiotics.  Timeout was performed.  Initially evaluated the right common femoral vein that was patent and image was saved.  This was accessed under ultrasound guidance with micro access needle, placed a microwire, and then a micro sheath.  Then used a Bentson wire and placed the TCAR venous sheath for distal embolic protection and flow reversal.  I then turned my attention to the left neck where a transverse incision was made one fingerbreath above the left clavicle and opened the platysma with Bovie cautery.  I then did a muscle-sparing technique between the heads of the sternocleidomastoid and identified the internal jugular vein that was mobilized lateral and the vagus nerve identified and preserved.  The left common carotid artery was identified and mobilized just above the clavicle.  Once we got enough of this mobilized, I then placed a vessel loop for proximal control and also an umbilical tape.  5-0 Prolene was used to place a U stitch on the anterior wall of the left common carotid artery.  Patient was given 100 units/kg IV heparin.  ACT was checked to maintain greater than 250.  I then accessed the common carotid in the pursestring with a micro access needle and placed a microwire and then a micro sheath.  We got carotid angiogram to identify the carotid bifurcation.  I then placed a J-wire and exchanged for the arterial sheath in the  left common carotid artery.  This was secured with multiple 3-0 silk sutures.  We then hooked up the filter system and this was connected to the venous sheath in the right common femoral vein.  An additional carotid angiogram was obtained to ensure no dissection and to identify the carotid lesion in the mid internal carotid artery.  That point in time we performed a TCAR timeout and ensured that his blood pressure  and heart rate were adequate.  We then clamped the proximal common carotid artery below the arterial sheath and went on active flow reversal.  The lesion was crossed with an 014 wire and then angioplastied with a 5 mm x 30 mm balloon.  We then primarily stented with a 9 mm x 40 mm Enroute stent.  We allowed active flow reversal for an additional 2 minutes.  Carotid angiogram showed greater than 30% stenosis and we elected to post-dilate the stent with a 6 mm x 30 mm angioplasty balloon.  Final carotid angiogram showed widely patent stent.  That point in time the filter was disconnected and passed off the field.  I removed the arterial sheath in the common carotid and the pursestring was tied down with good hemostasis.  The umbilical tape and vessel loop were removed.  Protamine was given for reversal after listening to the left carotid with Doppler and had good flow.  I then removed the venous sheath in the right groin and manual pressure was held for 5 minutes with good hemostasis.  The left neck was irrigated out Surgicel snow was used for hemostasis and I closed the platysma with 3-0 Vicryl and the skin with 4-0 Monocryl and Dermabond.  Awakened from anesthesia with no deficits.   Complication: None   Condition: Stable   Marty Heck, MD [facility] Office: [place]

## 2020-11-20 NOTE — Transfer of Care (Signed)
Immediate Anesthesia Transfer of Care Note  Patient: Joseph Villa  Procedure(s) Performed: LEFT TRANSCAROTID ARTERY REVASCULARIZATION (Left)  Patient Location: PACU  Anesthesia Type:General  Level of Consciousness: drowsy and patient cooperative  Airway & Oxygen Therapy: Patient Spontanous Breathing and Patient connected to face mask oxygen  Post-op Assessment: Report given to RN and Post -op Vital signs reviewed and stable  Post vital signs: Reviewed and stable  Last Vitals:  Vitals Value Taken Time  BP 123/63 11/20/20 1216  Temp    Pulse 72 11/20/20 1217  Resp 18 11/20/20 1217  SpO2 94 % 11/20/20 1217  Vitals shown include unvalidated device data.  Last Pain:  Vitals:   11/20/20 0853  TempSrc:   PainSc: 0-No pain         Complications: No notable events documented.

## 2020-11-20 NOTE — Progress Notes (Signed)
No specific orders in regards to blood thinners (aspirin and plavix). Dr. Carlis Abbott called and informed me to make sure patient takes 75mg  plavix and 81mg  aspirin today before surgery. Patient has already taken aspirin this morning. Plavix ordered put in.

## 2020-11-20 NOTE — H&P (Signed)
History and Physical Interval Note:  11/20/2020 9:56 AM  Joseph Villa  has presented today for surgery, with the diagnosis of Left Carotid Artery Occlusion.  The various methods of treatment have been discussed with the patient and family. After consideration of risks, benefits and other options for treatment, the patient has consented to  Procedure(s): LEFT TRANSCAROTID ARTERY REVASCULARIZATION (Left) as a surgical intervention.  The patient's history has been reviewed, patient examined, no change in status, stable for surgery.  I have reviewed the patient's chart and labs.  Questions were answered to the patient's satisfaction.    Left TCAR  [place]       Reason for Consult:  Left ICA stenosis  Requesting Physician: Ruta Hinds NP MRN #:  [id number]   History of Present Illness: This is a 80 y.o. male with past medical history significant for Diabetes mellitus, HTN, HLD, hypothyroidism, and CKD stage III who presented to ED at Ophthalmologist recommendation. Patient explains that last Tuesday 5/31 he was taking out the trash and upon returning to his porch he lost peripheral vision in part of his left eye and says he saw just a black dot. After going inside and after approximately 5-10 minutes this resolved. He went to his ophthalmologist office the same day for evaluation and the patient reports  that they did not find any abnormalities on exam but his Ophthalmologist was concerned about possible stroke. Carotid ultrasound was ordered and after results patient was directed to go to ED.   He has never experienced anything like this before. He has had no recurrence since that episode last Tuesday. He reports no slurred speech, facial drooping, weakness or numbness of his upper or lower extremities.   He has history of prior neck surgery for removal of a goiter many years ago. He had post operative hemorrhage requiring a second surgery. He and his wife do  not recall any history of radiation. No other neck surgeries. He is not a smoker. He is intolerant to statins due to myalgias and does not take Aspirin or other antiplatelets.         Past Medical History:  Diagnosis Date   ALLERGIC ASTHMA 11/25/2007   ALLERGIC RHINITIS 11/25/2007   Allergy     Arthritis, lumbar spine 04/18/2011   BIPOLAR DISORDER UNSPECIFIED 10/06/2009   Blood transfusion without reported diagnosis      s/p goiter surgery age 75   COLONIC POLYPS, HX OF 10/06/2009   Degenerative arthritis of hip 04/18/2011   DJD (degenerative joint disease)     DM w/o Complication Type II 08/08/486   GERD 10/06/2009   Hemorrhage yrs ago    after goiter surgery, tracheostomy inserted and later removed   Hemorrhoid     HYPERLIPIDEMIA 10/06/2009   HYPERTENSION, BENIGN 07/27/2009   HYPOTHYROIDISM 10/06/2009   NEPHROLITHIASIS, HX OF 10/06/2009   OBSTRUCTIVE SLEEP APNEA 11/25/2007    cpap setting of 9   Peptic stricture of esophagus     RBBB (right bundle branch block)     Sleep apnea      wears c pap           Past Surgical History:  Procedure Laterality Date   COLONOSCOPY       GANGLION CYST EXCISION        2-3 cysts removed   thyroid Goiter surgery       TONSILLECTOMY       TOTAL HIP ARTHROPLASTY   06/16/2012  Procedure: TOTAL HIP ARTHROPLASTY ANTERIOR APPROACH;  Surgeon: Mauri Pole, MD;  Location: [facility];  Service: Orthopedics;  Laterality: Left;   UMBILICAL HERNIA REPAIR   several yrs ago   UPPER GASTROINTESTINAL ENDOSCOPY               Allergies  Allergen Reactions   Cefuroxime Axetil Anaphylaxis      REACTION: anaphylaxis-ceftin     Cefprozil        REACTION: unknown   Cephalosporins Nausea Only   Crestor [Rosuvastatin Calcium]     Lipitor [Atorvastatin]        myalgiaas   Metformin And Related     Rabeprazole Sodium Nausea Only      REACTION: nausea   Statins               Prior to Admission medications  Medication Sig Start Date End Date Taking? Authorizing  Provider  bromocriptine (PARLODEL) 2.5 MG tablet TAKE ONE-HALF TABLET DAILY Patient taking differently: Take 2.5 mg by mouth daily. 02/14/20   Yes Renato Shin, MD  carbamazepine (TEGRETOL) 200 MG tablet TAKE 2 TABLETS AT BEDTIME Patient taking differently: Take 400 mg by mouth at bedtime. 11/07/20   Yes Biagio Borg, MD  Cholecalciferol (VITAMIN D3) 2000 UNITS TABS Take 2,000 Units by mouth every morning.     Yes [provider]  dapagliflozin propanediol (FARXIGA) 5 MG TABS tablet Take 1 tablet (5 mg total) by mouth daily before breakfast. 05/22/20   Yes Biagio Borg, MD  desoximetasone (TOPICORT) 0.25 % cream Apply 1 application topically 2 (two) times daily. 11/13/18   Yes Biagio Borg, MD  fluticasone (FLONASE) 50 MCG/ACT nasal spray ONE SPRAY INTO NOSE EVERY DAY Patient taking differently: Place 1 spray into both nostrils daily. 01/12/18   Yes Biagio Borg, MD  glucose blood (ACCU-CHEK AVIVA PLUS) test strip Used to check blood sugars twice a day Dx E11.9 07/24/16   Yes Biagio Borg, MD  glucose blood test strip Use to check blood sugar 2 times per day. DX Code: E11.9 07/12/15   Yes Renato Shin, MD  hydrochlorothiazide (HYDRODIURIL) 25 MG tablet TAKE ONE TABLET DAILY Patient taking differently: Take 25 mg by mouth daily. 06/20/20   Yes Biagio Borg, MD  JANUVIA 100 MG tablet TAKE ONE-HALF TABLET DAILY Patient taking differently: Take 50 mg by mouth daily. 10/11/20   Yes Biagio Borg, MD  Lancets Misc. (ACCU-CHEK FASTCLIX LANCET) KIT AS DIRECTED 03/05/16   Yes Biagio Borg, MD  levothyroxine (SYNTHROID) 112 MCG tablet TAKE ONE TABLET EACH DAY Patient taking differently: Take 112 mcg by mouth daily before breakfast. 03/08/20   Yes Biagio Borg, MD  Multiple Vitamins-Minerals (ICAPS AREDS FORMULA PO) Take 1 capsule by mouth daily.     Yes [provider]  nateglinide (STARLIX) 60 MG tablet TAKE ONE-HALF TABLET 3 TIMES DAILY WITH MEALS Patient taking differently: Take 30 mg  by mouth 2 (two) times daily. 08/04/20   Yes Biagio Borg, MD  olmesartan (BENICAR) 40 MG tablet TAKE ONE TABLET DAILY Patient taking differently: Take 40 mg by mouth daily. 06/20/20   Yes Biagio Borg, MD  Omega-3 Fatty Acids (FISH OIL) 1200 MG CAPS Take 1 capsule by mouth daily.     Yes [provider]  OVER THE COUNTER MEDICATION Take 1 tablet by mouth daily. Tumeric     Yes [provider]  pantoprazole (PROTONIX) 40 MG tablet TAKE ONE TABLET DAILY  Patient taking differently: Take 40 mg by mouth daily. 10/03/20   Yes Biagio Borg, MD  PROAIR HFA [phone] Base) MCG/ACT inhaler 2 PUFFS FOUR TIMES DAILY AS NEEDED Patient taking differently: Inhale 1 puff into the lungs every 6 (six) hours as needed. 05/25/18   Yes Biagio Borg, MD  tamsulosin (FLOMAX) 0.4 MG CAPS capsule TAKE ONE CAPSULE EACH DAY Patient taking differently: Take 0.4 mg by mouth daily. 06/30/20   Yes Biagio Borg, MD  desonide (DESOWEN) 0.05 % cream Apply 1 application topically 2 (two) times daily. Patient not taking: No sig reported       [provider]  PROCTOSOL HC 2.5 % rectal cream APPLY TWICE DAILY RECTALLY Patient not taking: No sig reported 10/19/20     Biagio Borg, MD      Social History         Socioeconomic History   Marital status: Married      Spouse name: Not on file   Number of children: 3   Years of education: Not on file   Highest education level: Not on file  Occupational History   Occupation: retired  Tobacco Use   Smoking status: Former      Packs/day: 1.50      Years: 15.00      Pack years: 22.50      Types: Cigarettes, Pipe      Quit date: 06/23/1977      Years since quitting: 43.4   Smokeless tobacco: Never  Substance and Sexual Activity   Alcohol use: No      Alcohol/week: 0.0 standard drinks   Drug use: No   Sexual activity: Not on file  Other Topics Concern   Not on file  Social History Narrative   Not on file    Social Determinants of Health     Financial Resource Strain: Not on file  Food Insecurity: Not on file  Transportation Needs: Not on file  Physical Activity: Not on file  Stress: Not on file  Social Connections: Not on file  Intimate Partner Violence: Not on file             Family History  Problem Relation Age of Onset   Throat cancer Mother     Alcohol abuse Brother     Diabetes Brother     Drug abuse Daughter     Alcoholism Other          uncle   Breast cancer Father     Colon cancer Neg Hx     Colon polyps Neg Hx     Esophageal cancer Neg Hx     Rectal cancer Neg Hx     Stomach cancer Neg Hx        ROS: Otherwise negative unless mentioned in HPI   Physical Examination       Vitals:    11/09/20 0624 11/09/20 0747  BP: (!) 157/67 (!) 159/76  Pulse: 60 65  Resp: 16 18  Temp: 98.2 F (36.8 C) 98.1 F (36.7 C)  SpO2: 95% 96%    Body mass index is 35.87 kg/m.   General:  WDWN in NAD Gait: Normal HENT: WNL, normocephalic Pulmonary: normal non-labored breathing Cardiac: regular,  without carotid bruits Abdomen: obese, soft, NT/ND Vascular Exam/Pulses: 2+ radial pulses, 2+ DP pulses bilaterally. Moving all extremities without deficits Musculoskeletal: no muscle wasting or atrophy       Neurologic: A&O X 3;  CN intact. No weakness or paresthesias  detected. speech is clear and coherent Psychiatric:  The pt has Normal affect.     CBC Labs (Brief)          Component Value Date/Time    WBC 7.8 11/08/2020 1617    RBC 4.14 (L) 11/08/2020 1617    HGB 12.4 (L) 11/08/2020 1617    HCT 39.7 11/08/2020 1617    PLT 231 11/08/2020 1617    MCV 95.9 11/08/2020 1617    MCH 30.0 11/08/2020 1617    MCHC 31.2 11/08/2020 1617    RDW 12.9 11/08/2020 1617    LYMPHSABS 1.9 11/08/2020 1617    MONOABS 0.7 11/08/2020 1617    EOSABS 0.4 11/08/2020 1617    BASOSABS 0.1 11/08/2020 1617        BMET Labs (Brief)          Component Value Date/Time    NA 138 11/08/2020 1617    K 5.0 11/08/2020 1617     CL 105 11/08/2020 1617    CO2 23 11/08/2020 1617    GLUCOSE 258 (H) 11/08/2020 1617    BUN 47 (H) 11/08/2020 1617    CREATININE 1.84 (H) 11/08/2020 1617    CALCIUM 9.4 11/08/2020 1617    GFRNONAA 37 (L) 11/08/2020 1617    GFRAA 69 (L) 06/17/2012 0428        COAGS: Recent Labs       Lab Results  Component Value Date    INR 1.0 11/08/2020    INR 0.99 06/08/2012          Non-Invasive Vascular Imaging:      Right Carotid Findings:  +----------+-------+-------+--------+------------------------+-------------  ----+            PSV    EDV    StenosisPlaque Description      Comments                      cm/s   cm/s                                                       +----------+-------+-------+--------+------------------------+-------------  ----+  CCA Prox  85     7                                                          +----------+-------+-------+--------+------------------------+-------------  ----+  CCA Distal64     12                                                         +----------+-------+-------+--------+------------------------+-------------  ----+  ICA Prox  210    56     40-59%  heterogenous and        upper end of                                        irregular  range                +----------+-------+-------+--------+------------------------+-------------  ----+  ICA Mid   212    37                                                         +----------+-------+-------+--------+------------------------+-------------  ----+  ICA Distal205    36                                                         +----------+-------+-------+--------+------------------------+-------------  ----+  ECA       117    15             heterogenous                                +----------+-------+-------+--------+------------------------+-------------  ----+    +----------+--------+-------+----------------+-------------------+            PSV cm/sEDV cmsDescribe        Arm Pressure (mmHG)  +----------+--------+-------+----------------+-------------------+  [id number]            Multiphasic, WNL                     +----------+--------+-------+----------------+-------------------+   +---------+--------+--+--------+--+---------+  VertebralPSV cm/s57EDV cm/s22Antegrade  +---------+--------+--+--------+--+---------+       Left Carotid Findings:  +----------+--------+--------+--------+--------------------------+---------  +            PSV cm/sEDV cm/sStenosisPlaque Description        Comments    +----------+--------+--------+--------+--------------------------+---------  +  CCA Prox  120     27                                                    +----------+--------+--------+--------+--------------------------+---------  +  CCA Distal66      17                                                    +----------+--------+--------+--------+--------------------------+---------  +  ICA Prox  76      14              heterogenous and calcific  Shadowing  +----------+--------+--------+--------+--------------------------+---------  +  ICA Mid   358     158     80-99%  heterogenous and irregular            +----------+--------+--------+--------+--------------------------+---------  +  ICA Distal140     40                                                    +----------+--------+--------+--------+--------------------------+---------  +  ECA       123  16              heterogenous                          +----------+--------+--------+--------+--------------------------+---------  +   +----------+--------+--------+----------------+-------------------+            PSV cm/sEDV cm/sDescribe        Arm Pressure (mmHG)   +----------+--------+--------+----------------+-------------------+  Subclavian160             Multiphasic, WNL                     +----------+--------+--------+----------------+-------------------+   +---------+--------+--+--------+--+---------+  VertebralPSV cm/s70EDV cm/s12Antegrade  +---------+--------+--+--------+--+---------+       IMPRESSION: MRI HEAD IMPRESSION:   1. Three subcentimeter acute ischemic infarcts involving the cortical and subcortical aspect of the left cerebral hemisphere as above, likely embolic in nature. No associated hemorrhage or mass effect. 2. Underlying mild age-related cerebral atrophy with chronic small vessel ischemic disease.   MRA HEAD IMPRESSION:   1. Negative intracranial MRA for large vessel occlusion. 2. Intracranial atherosclerotic disease with associated severe stenoses involving the left V4 segment, left SCA, and right A1 segment. Additional mild to moderate stenoses involving the mid left M1 and P2 segments.   MRA NECK IMPRESSION:   1. Eccentric atheromatous plaque about the proximal ICAs with associated stenoses of up to 75% on the left and 65% on the right. 2. Wide patency of both vertebral arteries within the neck. Right vertebral artery dominant.     Statin:  No. Beta Blocker:  No. Aspirin:  Yes.   ACEI:  No. ARB:  No. CCB use:  No Other antiplatelets/anticoagulants:  Yes.   Plavix     ASSESSMENT/PLAN: This is a 80 y.o. male who presents following amaurosis fugax in left eye. He was found to have 80-99% Left ICA stenosis on carotid duplex and MRA showing >75% left ICA stenosis with acute sub centimeter ischemic infarcts of the cortical and subcortical left cerebral hemisphere. His acute infarcts are likely embolic from the high grade lesion in the left ICA. The lesion appears very high on MRA. With his CKD stage III he did not undergo CTA for further evaluation. Recommendation would be to hydrate overnight  and obtain CTA for further surgical planning. Discussed with patient, his wife and daughter that he will need L TCAR vs CEA to prevent future stroke/ TIA - He is intolerant to statins. Zetia has been started - He has been started on Aspirin and Plavix - Continue adequate blood pressure control - Okay to eat from vascular standpoint - On call vascular surgeon, Dr. Carlis Abbott, will evaluate patient later today and provide further details regarding surgical planning and whether his intervention will be this admission or if he will be okay for discharge and schedule as outpatient      Karoline Caldwell PA-C [facility] [phone] 11/09/2020  11:23 AM   I have seen and evaluated the patient. I agree with the PA note as documented above.  80 year old male that vascular surgery has been consulted for carotid intervention on the left.  He had an episode of amaurosis in the left eye on May 31.  Subsequently he was referred for carotid evaluation and had a duplex showing a greater than 80% left ICA stenosis and then sent to the ED.  MRI has confirmed left brain event with 3 subcentimeter acute ischemic infarcts in the left hemisphere.  I reviewed his MRA  and appears to show a high-grade stenosis however this lesion appears fairly high in the mid ICA.  This was also correlated on ultrasound that showed his elevated velocity in mid ICA not the bifurcation.  I certainly think he needs a carotid intervention but my concern is given the distal extent of the lesion he may be better served with carotid stenting using a TCAR.  I understand he has an underlying CKD but I discussed with him and his wife about potential IV hydration tonight and a CTA of his neck tomorrow morning.  I will reach out to the hospitalist tonight.  Agree with dual antiplatelet therapy.  I discussed goal of surgical intervention is stroke risk reduction.  I think we can have a better plan after review of the CTA.  It is difficult to  evaluate for TCAR and size carotid stent based on the MRA as I discussed with them tonight.        Marty Heck, MD [facility] [place] Office: [phone]

## 2020-11-20 NOTE — Anesthesia Procedure Notes (Signed)
Procedure Name: Intubation Date/Time: 11/20/2020 10:29 AM Performed by: Lance Coon, CRNA Pre-anesthesia Checklist: Patient identified, Emergency Drugs available, Suction available, Patient being monitored and Timeout performed Patient Re-evaluated:Patient Re-evaluated prior to induction Oxygen Delivery Method: Circle system utilized Preoxygenation: Pre-oxygenation with 100% oxygen Induction Type: IV induction Ventilation: Mask ventilation without difficulty Laryngoscope Size: Glidescope and 4 Grade View: Grade I Tube type: Oral Tube size: 7.5 mm Number of attempts: 1 Airway Equipment and Method: Stylet and Video-laryngoscopy Placement Confirmation: ETT inserted through vocal cords under direct vision, positive ETCO2 and breath sounds checked- equal and bilateral Secured at: 22 cm Tube secured with: Tape Dental Injury: Teeth and Oropharynx as per pre-operative assessment

## 2020-11-21 ENCOUNTER — Encounter (HOSPITAL_COMMUNITY): Payer: Self-pay | Admitting: Vascular Surgery

## 2020-11-21 LAB — GLUCOSE, CAPILLARY: Glucose-Capillary: 140 mg/dL — ABNORMAL HIGH (ref 70–99)

## 2020-11-21 LAB — BASIC METABOLIC PANEL
Anion gap: 14 (ref 5–15)
BUN: 44 mg/dL — ABNORMAL HIGH (ref 8–23)
CO2: 20 mmol/L — ABNORMAL LOW (ref 22–32)
Calcium: 9.4 mg/dL (ref 8.9–10.3)
Chloride: 105 mmol/L (ref 98–111)
Creatinine, Ser: 1.67 mg/dL — ABNORMAL HIGH (ref 0.61–1.24)
GFR, Estimated: 42 mL/min — ABNORMAL LOW (ref >60)
Glucose, Bld: 151 mg/dL — ABNORMAL HIGH (ref 70–99)
Potassium: 4.6 mmol/L (ref 3.5–5.1)
Sodium: 139 mmol/L (ref 135–145)

## 2020-11-21 LAB — LIPID PANEL
Cholesterol: 201 mg/dL — ABNORMAL HIGH (ref 0–200)
HDL: 37 mg/dL — ABNORMAL LOW (ref >40)
LDL Cholesterol: 118 mg/dL — ABNORMAL HIGH (ref 0–99)
Total CHOL/HDL Ratio: 5.4 RATIO
Triglycerides: 230 mg/dL — ABNORMAL HIGH (ref <150)
VLDL: 46 mg/dL — ABNORMAL HIGH (ref 0–40)

## 2020-11-21 LAB — CBC
HCT: 34.6 % — ABNORMAL LOW (ref 39.0–52.0)
Hemoglobin: 11.2 g/dL — ABNORMAL LOW (ref 13.0–17.0)
MCH: 30.1 pg (ref 26.0–34.0)
MCHC: 32.4 g/dL (ref 30.0–36.0)
MCV: 93 fL (ref 80.0–100.0)
Platelets: 330 10*3/uL (ref 150–400)
RBC: 3.72 MIL/uL — ABNORMAL LOW (ref 4.22–5.81)
RDW: 12.7 % (ref 11.5–15.5)
WBC: 10.2 10*3/uL (ref 4.0–10.5)
nRBC: 0 % (ref 0.0–0.2)

## 2020-11-21 MED ORDER — OXYCODONE-ACETAMINOPHEN 5-325 MG PO TABS: 1.0000 | ORAL_TABLET | Freq: Four times a day (QID) | ORAL | 0 refills | Status: DC | PRN

## 2020-11-21 NOTE — Progress Notes (Addendum)
  Progress Note    11/21/2020 7:42 AM 1 Day Post-Op  Subjective:  no complaints   Vitals:   11/21/20 0200 11/21/20 0417  BP: 138/60 137/64  Pulse: 71 84  Resp: 16 20  Temp:  97.7 F (36.5 C)  SpO2: 95% 96%   Physical Exam: Cardiac:  regular Lungs:  non labored Incisions:  left neck incision is c/d/I, no swelling or hematoma Extremities: right femoral access site is clean, dry and intact without swelling or hematoma. moving all extremities without deficits Neurologic: alert and oriented. CN intact. Smile symmetric. Tongue midline. Speech coherent  CBC    Component Value Date/Time   WBC 10.2 11/21/2020 0428   RBC 3.72 (L) 11/21/2020 0428   HGB 11.2 (L) 11/21/2020 0428   HCT 34.6 (L) 11/21/2020 0428   PLT 330 11/21/2020 0428   MCV 93.0 11/21/2020 0428   MCH 30.1 11/21/2020 0428   MCHC 32.4 11/21/2020 0428   RDW 12.7 11/21/2020 0428   LYMPHSABS 1.9 11/08/2020 1617   MONOABS 0.7 11/08/2020 1617   EOSABS 0.4 11/08/2020 1617   BASOSABS 0.1 11/08/2020 1617    BMET    Component Value Date/Time   NA 139 11/21/2020 0428   K 4.6 11/21/2020 0428   CL 105 11/21/2020 0428   CO2 20 (L) 11/21/2020 0428   GLUCOSE 151 (H) 11/21/2020 0428   BUN 44 (H) 11/21/2020 0428   CREATININE 1.67 (H) 11/21/2020 0428   CALCIUM 9.4 11/21/2020 0428   GFRNONAA 42 (L) 11/21/2020 0428   GFRAA 69 (L) 06/17/2012 0428    INR    Component Value Date/Time   INR 1.0 11/16/2020 1200     Intake/Output Summary (Last 24 hours) at 11/21/2020 0742 Last data filed at 11/21/2020 0413 Gross per 24 hour  Intake 1700.23 ml  Output 1950 ml  Net -249.77 ml     Assessment/Plan:  80 y.o. male is s/p  L TCAR 1 Day Post-Op   - Doing well post op. Incision is clean, dry and intact without swelling or hematoma - right femoral vein access site soft, no swelling or hematoma - neurologically intact - VSS and Hemodynamically stable - Stable for discharge home today - Will d/c on Aspirin and Plavix,  statin intolerant on Zetia - Follow up in 1 month with carotid duplex  Karoline Caldwell, PA-C [facility] [phone] 11/21/2020 7:42 AM  I have seen and evaluated the patient. I agree with the PA note as documented above.  Postop day 1 status post left TCAR for symptomatic greater than 80% stenosis.  No acute events overnight and remains neurologically intact.  We will plan discharge home this morning with follow-up in 1 month with carotid duplex in the office.  We will need to continue aspirin Plavix at discharge.  He has statin intolerance and is on Zetia.  Discussed concerning signs and symptoms that he contact our office.  Overall looks good.  Marty Heck, MD [facility] [employer] Office: [phone]

## 2020-11-21 NOTE — Progress Notes (Signed)
Pt being D/C, VSS, telebox removed and returned, patient education provided, IV removed, Pt has not pooped since surgery per PA ok to D/C.   Chrisandra Carota, RN

## 2020-11-21 NOTE — Progress Notes (Signed)
PHARMACIST LIPID MONITORING   Joseph Villa is a 80 y.o. male admitted on 11/20/2020 with PVD.  Pharmacy has been consulted to optimize lipid-lowering therapy with the indication of secondary prevention for clinical ASCVD.  Recent Labs:  Lipid Panel (last 6 months):   Lab Results  Component Value Date   CHOL 201 (H) 11/21/2020   TRIG 230 (H) 11/21/2020   HDL 37 (L) 11/21/2020   CHOLHDL 5.4 11/21/2020   VLDL 46 (H) 11/21/2020   LDLCALC 118 (H) 11/21/2020   LDLDIRECT 169.0 10/23/2020    Hepatic function panel (last 6 months):   Lab Results  Component Value Date   AST 20 11/16/2020   ALT 32 11/16/2020   ALKPHOS 71 11/16/2020   BILITOT 0.5 11/16/2020   BILIDIR 0.1 10/23/2020    SCr (since admission):   Serum creatinine: 1.67 mg/dL (H) 11/21/20 0428 Estimated creatinine clearance: 47.6 mL/min (A)  Current therapy and lipid therapy tolerance Current lipid-lowering therapy: Zetia Previous lipid-lowering therapies (if applicable): Crestor, Lipitor Documented or reported allergies or intolerances to lipid-lowering therapies (if applicable): Severe myalgia  Assessment:   Patient prefers no changes in lipid-lowering therapy at this time due to adverse effects with statins.  Plan:    1.Statin intensity (high intensity recommended for all patients regardless of the LDL):  Statin intolerance noted. No statin changes due to serious side effects (ex. Myalgias with at least 2 different statins).  2.Add ezetimibe (if any one of the following):   Not indicated at this time.  3.Refer to lipid clinic:   Yes  4.Follow-up with:  Lipid clinic referral.  5.Follow-up labs after discharge:  Changes in lipid therapy were made. Check a lipid panel in 8-12 weeks then annually.    Onnie Boer, PharmD, BCIDP, AAHIVP, CPP Infectious Disease Pharmacist 11/21/2020 8:42 AM

## 2020-11-21 NOTE — Discharge Instructions (Signed)
   [facility] [place]  Discharge Instructions   Carotid Surgery  Please refer to the following instructions for your post-procedure care. Your surgeon or physician assistant will discuss any changes with you.  Activity  You are encouraged to walk as much as you can. You can slowly return to normal activities but must avoid strenuous activity and heavy lifting until your doctor tell you it's okay. Avoid activities such as vacuuming or swinging a golf club. You can drive after one week if you are comfortable and you are no longer taking prescription pain medications. It is normal to feel tired for serval weeks after your surgery. It is also normal to have difficulty with sleep habits, eating, and bowel movements after surgery. These will go away with time.  Bathing/Showering  Shower daily after you go home. Do not soak in a bathtub, hot tub, or swim until the incision heals completely.  Incision Care  Shower every day. Clean your incision with mild soap and water. Pat the area dry with a clean towel. You do not need a bandage unless otherwise instructed. Do not apply any ointments or creams to your incision. You may have skin glue on your incision. Do not peel it off. It will come off on its own in about one week. Your incision may feel thickened and raised for several weeks after your surgery. This is normal and the skin will soften over time.   For Men Only: It's okay to shave around the incision but do not shave the incision itself for 2 weeks. It is common to have numbness under your chin that could last for several months.  Diet  Resume your normal diet. There are no special food restrictions following this procedure. A low fat/low cholesterol diet is recommended for all patients with vascular disease. In order to heal from your surgery, it is CRITICAL to get adequate nutrition. Your body requires vitamins, minerals, and protein. Vegetables are the best source of  vitamins and minerals. Vegetables also provide the perfect balance of protein. Processed food has little nutritional value, so try to avoid this.  Medications  Resume taking all of your medications unless your doctor or physician assistant tells you not to. If your incision is causing pain, you may take over-the- counter pain relievers such as acetaminophen (Tylenol). If you were prescribed a stronger pain medication, please be aware these medications can cause nausea and constipation. Prevent nausea by taking the medication with a snack or meal. Avoid constipation by drinking plenty of fluids and eating foods with a high amount of fiber, such as fruits, vegetables, and grains.   Do not take Tylenol if you are taking prescription pain medications.  Follow Up  Our office will schedule a follow up appointment 2-3 weeks following discharge.  Please call us immediately for any of the following conditions  . Increased pain, redness, drainage (pus) from your incision site. . Fever of 101 degrees or higher. . If you should develop stroke (slurred speech, difficulty swallowing, weakness on one side of your body, loss of vision) you should call 911 and go to the nearest emergency room. .  Reduce your risk of vascular disease:  . Stop smoking. If you would like help call [facility] at 1-800-QUIT-NOW (1-800-784-8669) or [place] at [phone]. . Manage your cholesterol . Maintain a desired weight . Control your diabetes . Keep your blood pressure down .  If you have any questions, please call the office at [phone]. 

## 2020-11-21 NOTE — Progress Notes (Signed)
Pt transferred from PACU to 4E05. Pt is alert ans oriented x 4. No neuro deficits. His is hemodynamically stable, afebrile,  no distress at arrival. Denied pain. Incision on left neck with skin glue is dry and clean, no hematoma.   CCMD called with 2nd person verified. Call bell in reached. Room oriented. CHG bath given. We will continue to monitor.  Kennyth Lose, RN

## 2020-11-21 NOTE — Discharge Summary (Signed)
Carotid Discharge Summary     Joseph Villa 09-28-1940 80 y.o. male  [id number]  Admission Date: 11/20/2020  Discharge Date: 11/21/20   Physician: Marty Heck, MD  Admission Diagnosis: Left carotid artery stenosis [facility] Hospital Course:  The patient was admitted to the hospital and taken to the operating room on 11/20/2020 and underwent left transcarotid artery revascularization (TCAR). The pt tolerated the procedure well and was transported to the PACU in good condition.   By POD 1, the pt neuro status remained intact. Left neck incision clean dry and intact without hematoma. He remained hemodynamically stable.   The remainder of the hospital course consisted of increasing mobilization and increasing intake of solids without difficulty.  He remained stable for discharge home. He will continue on Aspirin and Plavix and Zetia. He has follow up arranged in 1 month with Carotid artery Duplex.    Recent Labs    11/21/20 0428  NA 139  K 4.6  CL 105  CO2 20*  GLUCOSE 151*  BUN 44*  CALCIUM 9.4   Recent Labs    11/21/20 0428  WBC 10.2  HGB 11.2*  HCT 34.6*  PLT 330   No results for input(s): INR in the last 72 hours.   Discharge Instructions     Call MD for:  difficulty breathing, headache or visual disturbances   Complete by: As directed    Call MD for:  redness, tenderness, or signs of infection (pain, swelling, redness, odor or green/yellow discharge around incision site)   Complete by: As directed    Call MD for:  severe uncontrolled pain   Complete by: As directed    Call MD for:  temperature >100.4   Complete by: As directed    Diet - low sodium heart healthy   Complete by: As directed    Discharge patient   Complete by: As directed    Discharge disposition: 01-Home or Self Care   Discharge patient date: 11/21/2020   Discharge wound care:   Complete by: As directed    Clean incision daily with mild soap and water, pat dry    Driving Restrictions   Complete by: As directed    No driving while taking pain medication   Increase activity slowly   Complete by: As directed    Lifting restrictions   Complete by: As directed    No heavy lifting, pushing, pulling > 10 lbs       Discharge Diagnosis:  Left carotid artery stenosis [I65.22]  Secondary Diagnosis: Patient Active Problem List   Diagnosis Date Noted   Left carotid artery stenosis 11/20/2020   Amaurosis fugax of left eye 11/08/2020   Hyperlipidemia associated with type 2 diabetes mellitus ([place]) 11/08/2020   Statin myopathy 05/22/2020   Eczema 11/13/2018   Rash 11/13/2018   Great toe pain, right 11/01/2017   Paronychia of toenail 10/18/2017   Chronic low back pain 05/02/2017   Chest pain 10/23/2016   CKD (chronic kidney disease), stage III ([place]) 04/19/2016   BPH (benign prostatic hypertrophy) with urinary obstruction 10/20/2015   Type 2 diabetes mellitus ([place]) 10/07/2015   Impingement syndrome of left shoulder 02/10/2015   Cough 07/09/2013   Positional vertigo 06/30/2012   Expected blood loss anemia 06/17/2012   Obese 06/17/2012   S/P left THA, AA 06/16/2012   Preop exam for internal medicine 06/04/2012   Peripheral neuropathy 12/21/2011   Anxiety 12/20/2011   Left hip pain 04/18/2011   Degenerative arthritis of hip  04/18/2011   Arthritis, lumbar spine 04/18/2011   Hemorrhoids, external 04/18/2011   Preventative health care 12/23/2010   Hypothyroidism 10/06/2009   Hyperlipidemia 10/06/2009   BIPOLAR DISORDER UNSPECIFIED 10/06/2009   Hypertension associated with diabetes ([place]) 10/06/2009   GERD 10/06/2009   COLONIC POLYPS, HX OF 10/06/2009   NEPHROLITHIASIS, HX OF 10/06/2009   Obstructive sleep apnea 11/25/2007   Seasonal and perennial allergic rhinitis 11/25/2007   Allergic-infective asthma 11/25/2007   Past Medical History:  Diagnosis Date   ALLERGIC ASTHMA 11/25/2007   ALLERGIC RHINITIS 11/25/2007   Allergy    Arthritis,  lumbar spine 04/18/2011   BIPOLAR DISORDER UNSPECIFIED 10/06/2009   Blood transfusion without reported diagnosis    s/p goiter surgery age 69    COLONIC POLYPS, HX OF 10/06/2009   Degenerative arthritis of hip 04/18/2011   DJD (degenerative joint disease)    DM w/o Complication Type II 15/17/6160   GERD 10/06/2009   Hemorrhage yrs ago   after goiter surgery, tracheostomy inserted and later removed   Hemorrhoid    History of kidney stones    HYPERLIPIDEMIA 10/06/2009   HYPERTENSION, BENIGN 07/27/2009   HYPOTHYROIDISM 10/06/2009   NEPHROLITHIASIS, HX OF 10/06/2009   OBSTRUCTIVE SLEEP APNEA 11/25/2007   cpap setting of 9   Peptic stricture of esophagus    RBBB (right bundle branch block)    Sleep apnea    wears c pap   Stroke [facility])     Allergies as of 11/21/2020       Reactions   Cefuroxime Axetil Anaphylaxis   REACTION: anaphylaxis-ceftin   Cefprozil    REACTION: unknown   Cephalosporins Nausea Only   Crestor [rosuvastatin Calcium]    Muscle aches    Lipitor [atorvastatin]    myalgiaas   Metformin And Related Diarrhea   Rabeprazole Sodium Nausea Only   REACTION: nausea        Medication List     TAKE these medications    Accu-Chek FastClix Lancet Kit AS DIRECTED   aspirin 81 MG chewable tablet Chew 1 tablet (81 mg total) by mouth daily.   clopidogrel 75 MG tablet Commonly known as: PLAVIX Take 1 tablet (75 mg total) by mouth daily. What changed: Another medication with the same name was removed. Continue taking this medication, and follow the directions you see here.   dapagliflozin propanediol 5 MG Tabs tablet Commonly known as: Farxiga Take 1 tablet (5 mg total) by mouth daily before breakfast.   ezetimibe 10 MG tablet Commonly known as: ZETIA Take 1 tablet (10 mg total) by mouth daily. What changed: Another medication with the same name was removed. Continue taking this medication, and follow the directions you see here.   Fish Oil 1200 MG  Caps Take 1 capsule by mouth daily.   glucose blood test strip Use to check blood sugar 2 times per day. DX Code: E11.9   glucose blood test strip Commonly known as: Accu-Chek Aviva Plus Used to check blood sugars twice a day Dx E11.9   ICAPS AREDS FORMULA PO Take 2 capsules by mouth daily.   oxyCODONE-acetaminophen 5-325 MG tablet Commonly known as: Percocet Take 1 tablet by mouth every 6 (six) hours as needed for severe pain.   TURMERIC CURCUMIN PO Take 500 mg by mouth daily.   vitamin B-12 1000 MCG tablet Commonly known as: CYANOCOBALAMIN Take 1,000 mcg by mouth daily.   Vitamin D3 50 MCG (2000 UT) Tabs Take 2,000 Units by mouth every morning.  ASK your doctor about these medications    bromocriptine 2.5 MG tablet Commonly known as: PARLODEL TAKE ONE-HALF TABLET DAILY   carbamazepine 200 MG tablet Commonly known as: TEGRETOL TAKE 2 TABLETS AT BEDTIME   desoximetasone 0.25 % cream Commonly known as: TOPICORT Apply 1 application topically 2 (two) times daily.   fluticasone 50 MCG/ACT nasal spray Commonly known as: FLONASE ONE SPRAY INTO NOSE EVERY DAY   hydrochlorothiazide 25 MG tablet Commonly known as: HYDRODIURIL TAKE ONE TABLET DAILY   Januvia 100 MG tablet Generic drug: sitaGLIPtin TAKE ONE-HALF TABLET DAILY   levothyroxine 112 MCG tablet Commonly known as: SYNTHROID TAKE ONE TABLET EACH DAY   nateglinide 60 MG tablet Commonly known as: STARLIX TAKE ONE-HALF TABLET 3 TIMES DAILY WITH MEALS   olmesartan 40 MG tablet Commonly known as: BENICAR TAKE ONE TABLET DAILY   pantoprazole 40 MG tablet Commonly known as: PROTONIX TAKE ONE TABLET DAILY   tamsulosin 0.4 MG Caps capsule Commonly known as: FLOMAX TAKE ONE CAPSULE EACH DAY               Discharge Care Instructions  (From admission, onward)           Start     Ordered   11/21/20 0000  Discharge wound care:       Comments: Clean incision daily with mild soap and  water, pat dry   11/21/20 0754             Discharge Instructions:   [facility] Discharge Instructions Carotid Endarterectomy (CEA)  Please refer to the following instructions for your post-procedure care. Your surgeon or physician assistant will discuss any changes with you.  Activity  You are encouraged to walk as much as you can. You can slowly return to normal activities but must avoid strenuous activity and heavy lifting until your doctor tell you it's OK. Avoid activities such as vacuuming or swinging a golf club. You can drive after one week if you are comfortable and you are no longer taking prescription pain medications. It is normal to feel tired for serval weeks after your surgery. It is also normal to have difficulty with sleep habits, eating, and bowel movements after surgery. These will go away with time.  Bathing/Showering  You may shower after you come home. Do not soak in a bathtub, hot tub, or swim until the incision heals completely.  Incision Care  Shower every day. Clean your incision with mild soap and water. Pat the area dry with a clean towel. You do not need a bandage unless otherwise instructed. Do not apply any ointments or creams to your incision. You may have skin glue on your incision. Do not peel it off. It will come off on its own in about one week. Your incision may feel thickened and raised for several weeks after your surgery. This is normal and the skin will soften over time. For Men Only: It's OK to shave around the incision but do not shave the incision itself for 2 weeks. It is common to have numbness under your chin that could last for several months.  Diet  Resume your normal diet. There are no special food restrictions following this procedure. A low fat/low cholesterol diet is recommended for all patients with vascular disease. In order to heal from your surgery, it is CRITICAL to get adequate nutrition. Your  body requires vitamins, minerals, and protein. Vegetables are the best source of vitamins and minerals. Vegetables also  provide the perfect balance of protein. Processed food has little nutritional value, so try to avoid this.  Medications  Resume taking all of your medications unless your doctor or physician assistant tells you not to.  If your incision is causing pain, you may take over-the- counter pain relievers such as acetaminophen (Tylenol). If you were prescribed a stronger pain medication, please be aware these medications can cause nausea and constipation.  Prevent nausea by taking the medication with a snack or meal. Avoid constipation by drinking plenty of fluids and eating foods with a high amount of fiber, such as fruits, vegetables, and grains. Do not take Tylenol if you are taking prescription pain medications.  Follow Up  Our office will schedule a follow up appointment 2-3 weeks following discharge.  Please call us immediately for any of the following conditions  Increased pain, redness, drainage (pus) from your incision site. Fever of 101 degrees or higher. If you should develop stroke (slurred speech, difficulty swallowing, weakness on one side of your body, loss of vision) you should call 911 and go to the nearest emergency room.  Reduce your risk of vascular disease:  Stop smoking. If you would like help call [facility] at 1-800-QUIT-NOW (305)729-0092) or [place] at [phone]. Manage your cholesterol Maintain a desired weight Control your diabetes Keep your blood pressure down  If you have any questions, please call the office at [phone].  Prescriptions given: Hydroc- Acetaminophen #15 No Refill  Disposition: Home  Patient's condition: is Good  Follow up: 1. Dr. Carlis Abbott in 2 weeks.   Intisar Claudio PA-C [facility] [phone]   --- For [facility] Registry use ---   Modified Rankin score at D/C (0-6): 0  IV medication needed  for:  1. Hypertension: No 2. Hypotension: No  Post-op Complications: No  1. Post-op CVA or TIA: No  If yes: Event classification (right eye, left eye, right cortical, left cortical, verterobasilar, other): n/a  If yes: Timing of event (intra-op, <6 hrs post-op, >=6 hrs post-op, unknown): n/a  2. CN injury: No  If yes: CN not injuried   3. Myocardial infarction: No  If yes: Dx by (EKG or clinical, Troponin): n/a  4.  CHF: No  5.  Dysrhythmia (new): No  6. Wound infection: No  7. Reperfusion symptoms: No  8. Return to OR: No  If yes: return to OR for (bleeding, neurologic, other CEA incision, other): n/a  Discharge medications: Statin use:  No ASA use:  Yes   Beta blocker use:  Yes ACE-Inhibitor use:  No  ARB use:  Yes CCB use: No P2Y12 Antagonist use: Yes, Valu.Nieves ] Plavix, '[ ]'  Plasugrel, '[ ]'  Ticlopinine, '[ ]'  Ticagrelor, '[ ]'  Other, '[ ]'  No for medical reason, '[ ]'  Non-compliant, '[ ]'  Not-indicated Anti-coagulant use:  No, '[ ]'  Warfarin, '[ ]'  Rivaroxaban, '[ ]'  Dabigatran,

## 2020-11-22 ENCOUNTER — Encounter: Payer: Self-pay | Admitting: Internal Medicine

## 2020-11-24 ENCOUNTER — Ambulatory Visit: Payer: Medicare Other | Admitting: Internal Medicine

## 2020-11-28 ENCOUNTER — Ambulatory Visit (INDEPENDENT_AMBULATORY_CARE_PROVIDER_SITE_OTHER): Payer: Medicare Other | Admitting: Internal Medicine

## 2020-11-28 ENCOUNTER — Encounter: Payer: Self-pay | Admitting: Internal Medicine

## 2020-11-28 ENCOUNTER — Other Ambulatory Visit: Payer: Self-pay

## 2020-11-28 VITALS — BP 112/66 | HR 78 | Temp 98.3°F | Ht 71.0 in | Wt 244.0 lb

## 2020-11-28 DIAGNOSIS — Z0001 Encounter for general adult medical examination with abnormal findings: Secondary | ICD-10-CM

## 2020-11-28 DIAGNOSIS — E1169 Type 2 diabetes mellitus with other specified complication: Secondary | ICD-10-CM | POA: Diagnosis not present

## 2020-11-28 DIAGNOSIS — T466X5A Adverse effect of antihyperlipidemic and antiarteriosclerotic drugs, initial encounter: Secondary | ICD-10-CM | POA: Diagnosis not present

## 2020-11-28 DIAGNOSIS — G453 Amaurosis fugax: Secondary | ICD-10-CM | POA: Diagnosis not present

## 2020-11-28 DIAGNOSIS — N1831 Chronic kidney disease, stage 3a: Secondary | ICD-10-CM | POA: Diagnosis not present

## 2020-11-28 DIAGNOSIS — E1122 Type 2 diabetes mellitus with diabetic chronic kidney disease: Secondary | ICD-10-CM | POA: Diagnosis not present

## 2020-11-28 DIAGNOSIS — I6522 Occlusion and stenosis of left carotid artery: Secondary | ICD-10-CM | POA: Diagnosis not present

## 2020-11-28 DIAGNOSIS — G720 Drug-induced myopathy: Secondary | ICD-10-CM

## 2020-11-28 DIAGNOSIS — N182 Chronic kidney disease, stage 2 (mild): Secondary | ICD-10-CM | POA: Diagnosis not present

## 2020-11-28 DIAGNOSIS — E785 Hyperlipidemia, unspecified: Secondary | ICD-10-CM | POA: Diagnosis not present

## 2020-11-28 DIAGNOSIS — Z0000 Encounter for general adult medical examination without abnormal findings: Secondary | ICD-10-CM

## 2020-11-28 NOTE — Progress Notes (Signed)
Patient ID: Joseph Villa, male   DOB: 07/18/1940, 80 y.o.   MRN: [id number]         Chief Complaint:: wellness exam and Follow-up  Recent amaurosis fugax, w/p left CEA, bilateral eye conjunctivitis, hx of cva and hld       HPI:  Joseph Villa is a 80 y.o. male here for wellness exam; declines covid booster, shingrix, due for colonoscopy later this yr also tdap, flu shot o/w up to date with preventive referrals and immunizations                        Also Overall lost about 10 lbs with recent left caratid stent; Pt denies chest pain, increased sob or doe, wheezing, orthopnea, PND, increased LE swelling, palpitations, dizziness or syncope.   Pt denies polydipsia, polyuria, or new focal neuro s/s    Pt denies fever, wt loss, night sweats, loss of appetite, or other constitutional symptoms  Tying to follow lower chol diet, tolerating zetia, but statin intolerant and has lipid clinic f/u aug 2022.  No other new complaints, eye infection has cleared.   Wt Readings from Last 3 Encounters:  11/28/20 244 lb (110.7 kg)  11/20/20 252 lb (114.3 kg)  11/16/20 252 lb 12.8 oz (114.7 kg)   BP Readings from Last 3 Encounters:  11/28/20 112/66  11/21/20 (!) 173/67  11/16/20 125/68   Immunization History  Administered Date(s) Administered   Fluad Quad(high Dose 65+) 02/19/2019, 04/15/2020   H1N1 05/09/2008   Influenza Split 03/21/2011, 03/13/2012   Influenza Whole 03/03/2009, 03/08/2010   Influenza, High Dose Seasonal PF 03/17/2015, 03/12/2016, 03/19/2017, 03/02/2018   Influenza,inj,Quad PF,6+ Mos 04/05/2013, 02/15/2014   PFIZER(Purple Top)SARS-COV-2 Vaccination 09/02/2019, 10/02/2019, 05/18/2020   Pneumococcal Conjugate-13 10/13/2013   Pneumococcal Polysaccharide-23 06/04/2007, 10/31/2017   Td 06/04/2007   Tdap 03/21/2011   There are no preventive care reminders to display for this patient.     Past Medical History:  Diagnosis Date   ALLERGIC ASTHMA 11/25/2007   ALLERGIC RHINITIS  11/25/2007   Allergy    Arthritis, lumbar spine 04/18/2011   BIPOLAR DISORDER UNSPECIFIED 10/06/2009   Blood transfusion without reported diagnosis    s/p goiter surgery age 40    COLONIC POLYPS, HX OF 10/06/2009   Degenerative arthritis of hip 04/18/2011   DJD (degenerative joint disease)    DM w/o Complication Type II 81/82/9937   GERD 10/06/2009   Hemorrhage yrs ago   after goiter surgery, tracheostomy inserted and later removed   Hemorrhoid    History of kidney stones    HYPERLIPIDEMIA 10/06/2009   HYPERTENSION, BENIGN 07/27/2009   HYPOTHYROIDISM 10/06/2009   NEPHROLITHIASIS, HX OF 10/06/2009   OBSTRUCTIVE SLEEP APNEA 11/25/2007   cpap setting of 9   Peptic stricture of esophagus    RBBB (right bundle branch block)    Sleep apnea    wears c pap   Stroke [facility])    Past Surgical History:  Procedure Laterality Date   COLONOSCOPY     GANGLION CYST EXCISION     2-3 cysts removed   HERNIA REPAIR     JOINT REPLACEMENT     thyroid Goiter surgery     TONSILLECTOMY     TONSILLECTOMY     TOTAL HIP ARTHROPLASTY  06/16/2012   Procedure: TOTAL HIP ARTHROPLASTY ANTERIOR APPROACH;  Surgeon: Mauri Pole, MD;  Location: [facility];  Service: Orthopedics;  Laterality: Left;   TRANSCAROTID ARTERY REVASCULARIZATION  Left 11/20/2020  Procedure: LEFT TRANSCAROTID ARTERY REVASCULARIZATION;  Surgeon: Marty Heck, MD;  Location: [place];  Service: Vascular;  Laterality: Left;   UMBILICAL HERNIA REPAIR  several yrs ago   UPPER GASTROINTESTINAL ENDOSCOPY      reports that he quit smoking about 43 years ago. His smoking use included cigarettes and pipe. He has a 22.50 pack-year smoking history. He has never used smokeless tobacco. He reports that he does not drink alcohol and does not use drugs. family history includes Alcohol abuse in his brother; Alcoholism in an other family member; Breast cancer in his father; Diabetes in his brother; Drug abuse in his daughter; Throat cancer in his  mother. Allergies  Allergen Reactions   Cefuroxime Axetil Anaphylaxis    REACTION: anaphylaxis-ceftin    Cefprozil     REACTION: unknown   Cephalosporins Nausea Only   Crestor [Rosuvastatin Calcium]     Muscle aches    Lipitor [Atorvastatin]     myalgiaas   Metformin And Related Diarrhea   Rabeprazole Sodium Nausea Only    REACTION: nausea   Current Outpatient Medications on File Prior to Visit  Medication Sig Dispense Refill   aspirin 81 MG chewable tablet Chew 1 tablet (81 mg total) by mouth daily. 90 tablet 3   bromocriptine (PARLODEL) 2.5 MG tablet TAKE ONE-HALF TABLET DAILY (Patient taking differently: Take 1.25 mg by mouth daily.) 45 tablet 3   carbamazepine (TEGRETOL) 200 MG tablet TAKE 2 TABLETS AT BEDTIME (Patient taking differently: Take 400 mg by mouth at bedtime.) 180 tablet 1   Cholecalciferol (VITAMIN D3) 2000 UNITS TABS Take 2,000 Units by mouth every morning.      clopidogrel (PLAVIX) 75 MG tablet Take 1 tablet (75 mg total) by mouth daily. 30 tablet 3   dapagliflozin propanediol (FARXIGA) 5 MG TABS tablet Take 1 tablet (5 mg total) by mouth daily before breakfast. 90 tablet 3   desoximetasone (TOPICORT) 0.25 % cream Apply 1 application topically 2 (two) times daily. (Patient taking differently: Apply 1 application topically 2 (two) times daily as needed (psoriasis).) 100 g 0   ezetimibe (ZETIA) 10 MG tablet Take 1 tablet (10 mg total) by mouth daily. 30 tablet 2   fluticasone (FLONASE) 50 MCG/ACT nasal spray ONE SPRAY INTO NOSE EVERY DAY (Patient taking differently: Place 2 sprays into both nostrils daily.) 48 g 1   glucose blood (ACCU-CHEK AVIVA PLUS) test strip Used to check blood sugars twice a day Dx E11.9 200 each 0   glucose blood test strip Use to check blood sugar 2 times per day. DX Code: E11.9 200 each 2   hydrochlorothiazide (HYDRODIURIL) 25 MG tablet TAKE ONE TABLET DAILY (Patient taking differently: Take 25 mg by mouth daily. TAKE ONE TABLET DAILY) 90  tablet 1   JANUVIA 100 MG tablet TAKE ONE-HALF TABLET DAILY (Patient taking differently: Take 50 mg by mouth daily.) 45 tablet 0   Lancets Misc. (ACCU-CHEK FASTCLIX LANCET) KIT AS DIRECTED 1 kit 0   levothyroxine (SYNTHROID) 112 MCG tablet TAKE ONE TABLET EACH DAY (Patient taking differently: Take 112 mcg by mouth daily before breakfast.) 90 tablet 2   Multiple Vitamins-Minerals (ICAPS AREDS FORMULA PO) Take 2 capsules by mouth daily.     nateglinide (STARLIX) 60 MG tablet TAKE ONE-HALF TABLET 3 TIMES DAILY WITH MEALS (Patient taking differently: Take 30 mg by mouth 3 (three) times daily with meals.) 135 tablet 1   olmesartan (BENICAR) 40 MG tablet TAKE ONE TABLET DAILY (Patient taking differently: Take 40 mg  by mouth daily. TAKE ONE TABLET DAILY) 90 tablet 1   Omega-3 Fatty Acids (FISH OIL) 1200 MG CAPS Take 1 capsule by mouth daily.     oxyCODONE-acetaminophen (PERCOCET) 5-325 MG tablet Take 1 tablet by mouth every 6 (six) hours as needed for severe pain. 15 tablet 0   pantoprazole (PROTONIX) 40 MG tablet TAKE ONE TABLET DAILY (Patient taking differently: Take 40 mg by mouth daily.) 90 tablet 0   tamsulosin (FLOMAX) 0.4 MG CAPS capsule TAKE ONE CAPSULE EACH DAY (Patient taking differently: Take 0.4 mg by mouth daily. TAKE ONE CAPSULE EACH DAY) 90 capsule 3   TURMERIC CURCUMIN PO Take 500 mg by mouth daily.     vitamin B-12 (CYANOCOBALAMIN) 1000 MCG tablet Take 1,000 mcg by mouth daily.     No current facility-administered medications on file prior to visit.        ROS:  All others reviewed and negative.  Objective        PE:  BP 112/66 (BP Location: Left Arm, Patient Position: Sitting, Cuff Size: Large)   Pulse 78   Temp 98.3 F (36.8 C) (Oral)   Ht '5\' 11"'  (1.803 m)   Wt 244 lb (110.7 kg)   SpO2 96%   BMI 34.03 kg/m                 Constitutional: Pt appears in NAD               HENT: Head: NCAT.                Right Ear: External ear normal.                 Left Ear: External ear  normal.                Eyes: . Pupils are equal, round, and reactive to light. Conjunctivae and EOM are normal               Nose: without d/c or deformity               Neck: Neck supple. Gross normal ROM               Cardiovascular: Normal rate and regular rhythm.                 Pulmonary/Chest: Effort normal and breath sounds without rales or wheezing.                Abd:  Soft, NT, ND, + BS, no organomegaly               Neurological: Pt is alert. At baseline orientation, motor grossly intact               Skin: Skin is warm. No rashes, no other new lesions, LE edema - none               Psychiatric: Pt behavior is normal without agitation   Micro: none  Cardiac tracings I have personally interpreted today:  none  Pertinent Radiological findings (summarize): none   Lab Results  Component Value Date   WBC 10.2 11/21/2020   HGB 11.2 (L) 11/21/2020   HCT 34.6 (L) 11/21/2020   PLT 330 11/21/2020   GLUCOSE 151 (H) 11/21/2020   CHOL 201 (H) 11/21/2020   TRIG 230 (H) 11/21/2020   HDL 37 (L) 11/21/2020   LDLDIRECT 169.0 10/23/2020   LDLCALC 118 (H) 11/21/2020   ALT 32 11/16/2020  AST 20 11/16/2020   NA 139 11/21/2020   K 4.6 11/21/2020   CL 105 11/21/2020   CREATININE 1.67 (H) 11/21/2020   BUN 44 (H) 11/21/2020   CO2 20 (L) 11/21/2020   TSH 6.54 (H) 10/23/2020   PSA 0.84 10/23/2020   INR 1.0 11/16/2020   HGBA1C 7.1 (H) 11/08/2020   MICROALBUR 13.3 (H) 10/23/2020   Assessment/Plan:  Joseph Villa is a 80 y.o. White or Caucasian [1] male with  has a past medical history of ALLERGIC ASTHMA (11/25/2007), ALLERGIC RHINITIS (11/25/2007), Allergy, Arthritis, lumbar spine (04/18/2011), BIPOLAR DISORDER UNSPECIFIED (10/06/2009), Blood transfusion without reported diagnosis, COLONIC POLYPS, HX OF (10/06/2009), Degenerative arthritis of hip (04/18/2011), DJD (degenerative joint disease), DM w/o Complication Type II (32/91/9166), GERD (10/06/2009), Hemorrhage (yrs ago), Hemorrhoid,  History of kidney stones, HYPERLIPIDEMIA (10/06/2009), HYPERTENSION, BENIGN (07/27/2009), HYPOTHYROIDISM (10/06/2009), NEPHROLITHIASIS, HX OF (10/06/2009), OBSTRUCTIVE SLEEP APNEA (11/25/2007), Peptic stricture of esophagus, RBBB (right bundle branch block), Sleep apnea, and Stroke ([place]).  Encounter for well adult exam with abnormal findings Age and sex appropriate education and counseling updated with regular exercise and diet Referrals for preventative services - declines colonoscopy for now Immunizations addressed - decliens covid booster and shingrix Smoking counseling  - none needed Evidence for depression or other mood disorder - none significant Most recent labs reviewed. I have personally reviewed and have noted: 1) the patient's medical and social history 2) The patient's current medications and supplements 3) The patient's height, weight, and BMI have been recorded in the chart   CKD (chronic kidney disease), stage III ([place]) Lab Results  Component Value Date   CREATININE 1.67 (H) 11/21/2020   Stable overall, cont to avoid nephrotoxins   Statin myopathy Cont to avoid statin, has f/u lipid clinic aug 2022  Type 2 diabetes mellitus ([place]) Lab Results  Component Value Date   HGBA1C 7.1 (H) 11/08/2020   Mild uncontrolled with goal < 7.0, pt to continue current medical treatment farxiga, starlix as declines change today   Amaurosis fugax of left eye [place], cont current tx - plavix  Hyperlipidemia associated with type 2 diabetes mellitus ([place]) Lab Results  Component Value Date   [place] 118 (H) 11/21/2020   Stable, pt to continue current med tx, and f/u lipid clinic, goal ldl < 70    Left carotid artery stenosis S/p left carotid stent, stable, to f/u any worsening symptoms or concerns  Followup: Return in about 6 months (around 05/30/2021).  Cathlean Cower, MD 12/02/2020 7:59 PM [facility]

## 2020-11-28 NOTE — Patient Instructions (Signed)
Please continue all other medications as before, and refills have been done if requested. ° °Please have the pharmacy call with any other refills you may need. ° °Please continue your efforts at being more active, low cholesterol diet, and weight control. ° °You are otherwise up to date with prevention measures today. ° °Please keep your appointments with your specialists as you may have planned ° °Please make an Appointment to return in 6 months, or sooner if needed °

## 2020-12-02 ENCOUNTER — Encounter: Payer: Self-pay | Admitting: Internal Medicine

## 2020-12-02 NOTE — Assessment & Plan Note (Signed)
S/p left carotid stent, stable, to f/u any worsening symptoms or concerns

## 2020-12-02 NOTE — Assessment & Plan Note (Signed)
[place], cont current tx - plavix

## 2020-12-02 NOTE — Assessment & Plan Note (Signed)
Lab Results  Component Value Date   CREATININE 1.67 (H) 11/21/2020   Stable overall, cont to avoid nephrotoxins

## 2020-12-02 NOTE — Assessment & Plan Note (Signed)
Cont to avoid statin, has f/u lipid clinic aug 2022

## 2020-12-02 NOTE — Assessment & Plan Note (Addendum)
Lab Results  Component Value Date   HGBA1C 7.1 (H) 11/08/2020   Mild uncontrolled with goal < 7.0, pt to continue current medical treatment farxiga, starlix as declines change today

## 2020-12-02 NOTE — Assessment & Plan Note (Signed)
Lab Results  Component Value Date   LDLCALC 118 (H) 11/21/2020   Stable, pt to continue current med tx, and f/u lipid clinic, goal ldl < 70

## 2020-12-02 NOTE — Assessment & Plan Note (Signed)
Age and sex appropriate education and counseling updated with regular exercise and diet Referrals for preventative services - declines colonoscopy for now Immunizations addressed - decliens covid booster and shingrix Smoking counseling  - none needed Evidence for depression or other mood disorder - none significant Most recent labs reviewed. I have personally reviewed and have noted: 1) the patient's medical and social history 2) The patient's current medications and supplements 3) The patient's height, weight, and BMI have been recorded in the chart

## 2020-12-08 ENCOUNTER — Encounter: Payer: Self-pay | Admitting: Internal Medicine

## 2020-12-08 ENCOUNTER — Other Ambulatory Visit: Payer: Self-pay | Admitting: *Deleted

## 2020-12-08 ENCOUNTER — Other Ambulatory Visit: Payer: Self-pay | Admitting: Internal Medicine

## 2020-12-08 DIAGNOSIS — I6522 Occlusion and stenosis of left carotid artery: Secondary | ICD-10-CM

## 2020-12-08 NOTE — Telephone Encounter (Signed)
Please refill as per office routine med refill policy (all routine meds refilled for 3 mo or monthly per pt preference up to one year from last visit, then month to month grace period for 3 mo, then further med refills will have to be denied)  

## 2020-12-19 ENCOUNTER — Ambulatory Visit (INDEPENDENT_AMBULATORY_CARE_PROVIDER_SITE_OTHER): Payer: Medicare Other | Admitting: Physician Assistant

## 2020-12-19 ENCOUNTER — Ambulatory Visit (HOSPITAL_COMMUNITY)
Admission: RE | Admit: 2020-12-19 | Discharge: 2020-12-19 | Disposition: A | Discharge status: Home / Self Care | Payer: Medicare Other | Source: Ambulatory Visit | Attending: Vascular Surgery | Admitting: Vascular Surgery

## 2020-12-19 ENCOUNTER — Other Ambulatory Visit: Payer: Self-pay

## 2020-12-19 VITALS — BP 135/70 | HR 73 | Temp 98.6°F | Resp 20 | Ht 71.0 in | Wt 246.8 lb

## 2020-12-19 DIAGNOSIS — I6523 Occlusion and stenosis of bilateral carotid arteries: Secondary | ICD-10-CM

## 2020-12-19 DIAGNOSIS — I6522 Occlusion and stenosis of left carotid artery: Secondary | ICD-10-CM | POA: Diagnosis not present

## 2020-12-19 NOTE — Progress Notes (Signed)
POST OPERATIVE OFFICE NOTE    CC:  F/u for surgery  HPI:  This is a 80 y.o. male who is s/p left TCAR on 11/20/2020 by Dr. Carlis Abbott. This was due to symptomatic high-grade left internal carotid artery stenosis (>80%) with left brain stroke. He exhibited left amaurosis fugax.  He denies symptoms of monolcular blindness, extremity numbness or weakness, slurrend speech or facila drooping.  Compliant with aspirin, Plavix. Intolerant of statins, on ezetimibe  Former smoker  Allergies  Allergen Reactions   Cefuroxime Axetil Anaphylaxis    REACTION: anaphylaxis-ceftin    Cefprozil     REACTION: unknown   Cephalosporins Nausea Only   Crestor [Rosuvastatin Calcium]     Muscle aches    Lipitor [Atorvastatin]     myalgiaas   Metformin And Related Diarrhea   Rabeprazole Sodium Nausea Only    REACTION: nausea    Current Outpatient Medications  Medication Sig Dispense Refill   aspirin 81 MG chewable tablet Chew 1 tablet (81 mg total) by mouth daily. 90 tablet 3   bromocriptine (PARLODEL) 2.5 MG tablet TAKE ONE-HALF TABLET DAILY (Patient taking differently: Take 1.25 mg by mouth daily.) 45 tablet 3   carbamazepine (TEGRETOL) 200 MG tablet TAKE 2 TABLETS AT BEDTIME (Patient taking differently: Take 400 mg by mouth at bedtime.) 180 tablet 1   Cholecalciferol (VITAMIN D3) 2000 UNITS TABS Take 2,000 Units by mouth every morning.      clopidogrel (PLAVIX) 75 MG tablet Take 1 tablet (75 mg total) by mouth daily. 30 tablet 3   dapagliflozin propanediol (FARXIGA) 5 MG TABS tablet Take 1 tablet (5 mg total) by mouth daily before breakfast. 90 tablet 3   desoximetasone (TOPICORT) 0.25 % cream Apply 1 application topically 2 (two) times daily. (Patient taking differently: Apply 1 application topically 2 (two) times daily as needed (psoriasis).) 100 g 0   ezetimibe (ZETIA) 10 MG tablet Take 1 tablet (10 mg total) by mouth daily. 30 tablet 2   fluticasone (FLONASE) 50 MCG/ACT nasal spray ONE SPRAY INTO  NOSE EVERY DAY (Patient taking differently: Place 2 sprays into both nostrils daily.) 48 g 1   glucose blood (ACCU-CHEK AVIVA PLUS) test strip Used to check blood sugars twice a day Dx E11.9 200 each 0   glucose blood test strip Use to check blood sugar 2 times per day. DX Code: E11.9 200 each 2   hydrochlorothiazide (HYDRODIURIL) 25 MG tablet TAKE ONE TABLET DAILY (Patient taking differently: Take 25 mg by mouth daily. TAKE ONE TABLET DAILY) 90 tablet 1   JANUVIA 100 MG tablet TAKE ONE-HALF TABLET DAILY (Patient taking differently: Take 50 mg by mouth daily.) 45 tablet 0   Lancets Misc. (ACCU-CHEK FASTCLIX LANCET) KIT AS DIRECTED 1 kit 0   levothyroxine (SYNTHROID) 112 MCG tablet TAKE ONE TABLET EACH DAY 90 tablet 2   Multiple Vitamins-Minerals (ICAPS AREDS FORMULA PO) Take 2 capsules by mouth daily.     nateglinide (STARLIX) 60 MG tablet TAKE ONE-HALF TABLET 3 TIMES DAILY WITH MEALS (Patient taking differently: Take 30 mg by mouth 3 (three) times daily with meals.) 135 tablet 1   olmesartan (BENICAR) 40 MG tablet TAKE ONE TABLET DAILY (Patient taking differently: Take 40 mg by mouth daily. TAKE ONE TABLET DAILY) 90 tablet 1   Omega-3 Fatty Acids (FISH OIL) 1200 MG CAPS Take 1 capsule by mouth daily.     oxyCODONE-acetaminophen (PERCOCET) 5-325 MG tablet Take 1 tablet by mouth every 6 (six) hours as needed for severe pain.  15 tablet 0   pantoprazole (PROTONIX) 40 MG tablet TAKE ONE TABLET DAILY (Patient taking differently: Take 40 mg by mouth daily.) 90 tablet 0   tamsulosin (FLOMAX) 0.4 MG CAPS capsule TAKE ONE CAPSULE EACH DAY (Patient taking differently: Take 0.4 mg by mouth daily. TAKE ONE CAPSULE EACH DAY) 90 capsule 3   TURMERIC CURCUMIN PO Take 500 mg by mouth daily.     vitamin B-12 (CYANOCOBALAMIN) 1000 MCG tablet Take 1,000 mcg by mouth daily.     No current facility-administered medications for this visit.     ROS:  See HPI    Physical Exam:  General appearance: Awake, alert in  no apparent distress Neurologic: Alert and oriented x4, tongue midline, face symmetric, grip strength 5/5 bilaterally Cardiac: Heart rate and rhythm are regular Respirations: Nonlabored Incision: Well approximated> healing without signs of infection or hematoma  Carotid duplex: 12/19/2020 Right Carotid: Velocities in the right ICA are consistent with a 40-59% stenosis.   Left Carotid: Patent Left ICA stent <50% stenosis  Assessment/Plan:  This is a 80 y.o. male who is s/p:left TCAR. Neurologically intact. Continue aspirin, Plavix.  Reviewed signs and symptoms of stroke/TIA and advised to call EMS should these occur. Advised regarding BP, glucose control. Continued smoking cessation. Follow-up in 9 months with duplex.    Risa Grill, PA-C [facility] [phone]  Clinic MD:  Dr. Carlis Abbott

## 2020-12-20 ENCOUNTER — Other Ambulatory Visit: Payer: Self-pay

## 2020-12-20 DIAGNOSIS — I6523 Occlusion and stenosis of bilateral carotid arteries: Secondary | ICD-10-CM

## 2020-12-21 ENCOUNTER — Other Ambulatory Visit: Payer: Self-pay | Admitting: Internal Medicine

## 2020-12-21 NOTE — Telephone Encounter (Signed)
Please refill as per office routine med refill policy (all routine meds refilled for 3 mo or monthly per pt preference up to one year from last visit, then month to month grace period for 3 mo, then further med refills will have to be denied)  

## 2021-01-02 DIAGNOSIS — N183 Chronic kidney disease, stage 3 unspecified: Secondary | ICD-10-CM | POA: Diagnosis not present

## 2021-01-02 DIAGNOSIS — E1122 Type 2 diabetes mellitus with diabetic chronic kidney disease: Secondary | ICD-10-CM | POA: Diagnosis not present

## 2021-01-02 DIAGNOSIS — M109 Gout, unspecified: Secondary | ICD-10-CM | POA: Diagnosis not present

## 2021-01-02 DIAGNOSIS — I129 Hypertensive chronic kidney disease with stage 1 through stage 4 chronic kidney disease, or unspecified chronic kidney disease: Secondary | ICD-10-CM | POA: Diagnosis not present

## 2021-01-02 DIAGNOSIS — Z95828 Presence of other vascular implants and grafts: Secondary | ICD-10-CM | POA: Diagnosis not present

## 2021-01-02 DIAGNOSIS — G453 Amaurosis fugax: Secondary | ICD-10-CM | POA: Diagnosis not present

## 2021-01-02 DIAGNOSIS — K219 Gastro-esophageal reflux disease without esophagitis: Secondary | ICD-10-CM | POA: Diagnosis not present

## 2021-01-02 DIAGNOSIS — Z9889 Other specified postprocedural states: Secondary | ICD-10-CM | POA: Diagnosis not present

## 2021-01-04 ENCOUNTER — Other Ambulatory Visit: Payer: Self-pay | Admitting: Internal Medicine

## 2021-01-31 ENCOUNTER — Ambulatory Visit (HOSPITAL_BASED_OUTPATIENT_CLINIC_OR_DEPARTMENT_OTHER): Payer: Medicare Other | Admitting: Internal Medicine

## 2021-01-31 ENCOUNTER — Other Ambulatory Visit: Payer: Self-pay

## 2021-01-31 ENCOUNTER — Encounter (HOSPITAL_BASED_OUTPATIENT_CLINIC_OR_DEPARTMENT_OTHER): Payer: Self-pay | Admitting: Internal Medicine

## 2021-01-31 VITALS — BP 118/56 | HR 73 | Ht 71.0 in | Wt 252.2 lb

## 2021-01-31 DIAGNOSIS — M791 Myalgia, unspecified site: Secondary | ICD-10-CM

## 2021-01-31 DIAGNOSIS — E785 Hyperlipidemia, unspecified: Secondary | ICD-10-CM | POA: Diagnosis not present

## 2021-01-31 DIAGNOSIS — Z8673 Personal history of transient ischemic attack (TIA), and cerebral infarction without residual deficits: Secondary | ICD-10-CM | POA: Diagnosis not present

## 2021-01-31 DIAGNOSIS — T466X5A Adverse effect of antihyperlipidemic and antiarteriosclerotic drugs, initial encounter: Secondary | ICD-10-CM

## 2021-01-31 DIAGNOSIS — I6522 Occlusion and stenosis of left carotid artery: Secondary | ICD-10-CM | POA: Diagnosis not present

## 2021-01-31 DIAGNOSIS — G453 Amaurosis fugax: Secondary | ICD-10-CM | POA: Diagnosis not present

## 2021-01-31 MED ORDER — REPATHA SURECLICK 140 MG/ML ~~LOC~~ SOAJ: 1.0000 | SUBCUTANEOUS | 11 refills | Status: DC

## 2021-01-31 NOTE — Progress Notes (Signed)
LIPID CLINIC CONSULT NOTE  Chief Complaint:  Manage dyslipidemia  Primary Care Physician: Joseph Borg, MD  Primary Cardiologist:  None  HPI:  Joseph Villa is a 80 y.o. male who is being seen today for the evaluation of dyslipidemia at the request of Joseph Heck, MD. this is a pleasant 80 year old male kindly referred by Dr. Carlis Villa for evaluation management of dyslipidemia.  Joseph Villa has a history of stroke in the past in fact was found to have remote strokes on cerebral imaging but recently had an episode of amaurosis fugax.  He was subsequently found to have 80 to 99% left carotid artery stenosis.  He was subsequently seen and underwent TCAR with a successful result.  He does have a history of dyslipidemia and has been statin intolerant in the past having had myalgias.  He currently is on ezetimibe.  His most recent lipids show total cholesterol of 201, triglycerides 230, HDL 37 and LDL 118.  His target LDL should be less than 70.  He does report somewhat of a varied diet but has been trying to reduce saturated fats.  PMHx:  Past Medical History:  Diagnosis Date   ALLERGIC ASTHMA 11/25/2007   ALLERGIC RHINITIS 11/25/2007   Allergy    Arthritis, lumbar spine 04/18/2011   BIPOLAR DISORDER UNSPECIFIED 10/06/2009   Blood transfusion without reported diagnosis    s/p goiter surgery age 46    COLONIC POLYPS, HX OF 10/06/2009   Degenerative arthritis of hip 04/18/2011   DJD (degenerative joint disease)    DM w/o Complication Type II 79/89/2119   GERD 10/06/2009   Hemorrhage yrs ago   after goiter surgery, tracheostomy inserted and later removed   Hemorrhoid    History of kidney stones    HYPERLIPIDEMIA 10/06/2009   HYPERTENSION, BENIGN 07/27/2009   HYPOTHYROIDISM 10/06/2009   NEPHROLITHIASIS, HX OF 10/06/2009   OBSTRUCTIVE SLEEP APNEA 11/25/2007   cpap setting of 9   Peptic stricture of esophagus    RBBB (right bundle branch block)    Sleep apnea    wears c  pap   Stroke [facility])     Past Surgical History:  Procedure Laterality Date   COLONOSCOPY     GANGLION CYST EXCISION     2-3 cysts removed   HERNIA REPAIR     JOINT REPLACEMENT     thyroid Goiter surgery     TONSILLECTOMY     TONSILLECTOMY     TOTAL HIP ARTHROPLASTY  06/16/2012   Procedure: TOTAL HIP ARTHROPLASTY ANTERIOR APPROACH;  Surgeon: Joseph Pole, MD;  Location: [facility];  Service: Orthopedics;  Laterality: Left;   TRANSCAROTID ARTERY REVASCULARIZATION  Left 11/20/2020   Procedure: LEFT TRANSCAROTID ARTERY REVASCULARIZATION;  Surgeon: Joseph Heck, MD;  Location: [facility] OR;  Service: Vascular;  Laterality: Left;   UMBILICAL HERNIA REPAIR  several yrs ago   UPPER GASTROINTESTINAL ENDOSCOPY      FAMHx:  Family History  Problem Relation Age of Onset   Throat cancer Mother    Alcohol abuse Brother    Diabetes Brother    Drug abuse Daughter    Alcoholism Other        uncle   Breast cancer Father    Colon cancer Neg Hx    Colon polyps Neg Hx    Esophageal cancer Neg Hx    Rectal cancer Neg Hx    Stomach cancer Neg Hx     SOCHx:   reports that he quit smoking  about 43 years ago. His smoking use included cigarettes and pipe. He has a 22.50 pack-year smoking history. He has never used smokeless tobacco. He reports that he does not drink alcohol and does not use drugs.  ALLERGIES:  Allergies  Allergen Reactions   Cefuroxime Axetil Anaphylaxis    REACTION: anaphylaxis-ceftin    Cefprozil     REACTION: unknown   Cephalosporins Nausea Only   Crestor [Rosuvastatin Calcium]     Muscle aches    Lipitor [Atorvastatin]     myalgiaas   Metformin And Related Diarrhea   Rabeprazole Sodium Nausea Only    REACTION: nausea    ROS: Pertinent items noted in HPI and remainder of comprehensive ROS otherwise negative.  HOME MEDS: Current Outpatient Medications on File Prior to Visit  Medication Sig Dispense Refill   Accu-Chek FastClix Lancets MISC 2 (two) times daily.      aspirin 81 MG chewable tablet Chew 1 tablet (81 mg total) by mouth daily. 90 tablet 3   bromocriptine (PARLODEL) 2.5 MG tablet TAKE ONE-HALF TABLET DAILY (Patient taking differently: Take 1.25 mg by mouth daily.) 45 tablet 3   carbamazepine (TEGRETOL) 200 MG tablet TAKE 2 TABLETS AT BEDTIME (Patient taking differently: Take 400 mg by mouth at bedtime.) 180 tablet 1   Cholecalciferol (VITAMIN D3) 2000 UNITS TABS Take 2,000 Units by mouth every morning.      clopidogrel (PLAVIX) 75 MG tablet Take 1 tablet (75 mg total) by mouth daily. 30 tablet 3   dapagliflozin propanediol (FARXIGA) 5 MG TABS tablet Take 1 tablet (5 mg total) by mouth daily before breakfast. 90 tablet 3   desoximetasone (TOPICORT) 0.25 % cream Apply 1 application topically 2 (two) times daily. (Patient taking differently: Apply 1 application topically 2 (two) times daily as needed (psoriasis).) 100 g 0   ezetimibe (ZETIA) 10 MG tablet Take 1 tablet (10 mg total) by mouth daily. 30 tablet 2   fluticasone (FLONASE) 50 MCG/ACT nasal spray ONE SPRAY INTO NOSE EVERY DAY (Patient taking differently: Place 2 sprays into both nostrils daily.) 48 g 1   glucose blood (ACCU-CHEK AVIVA PLUS) test strip Used to check blood sugars twice a day Dx E11.9 200 each 0   hydrochlorothiazide (HYDRODIURIL) 25 MG tablet TAKE ONE TABLET DAILY 90 tablet 1   JANUVIA 100 MG tablet TAKE ONE-HALF TABLET DAILY (Patient taking differently: Take 50 mg by mouth daily.) 45 tablet 0   Lancets Misc. (ACCU-CHEK FASTCLIX LANCET) KIT AS DIRECTED 1 kit 0   levothyroxine (SYNTHROID) 112 MCG tablet TAKE ONE TABLET EACH DAY 90 tablet 2   Multiple Vitamins-Minerals (ICAPS AREDS FORMULA PO) Take 2 capsules by mouth daily.     nateglinide (STARLIX) 60 MG tablet TAKE ONE-HALF TABLET 3 TIMES DAILY WITH MEALS (Patient taking differently: Take 30 mg by mouth 3 (three) times daily with meals.) 135 tablet 1   olmesartan (BENICAR) 40 MG tablet TAKE ONE TABLET DAILY 90 tablet 1    Omega-3 Fatty Acids (FISH OIL) 1200 MG CAPS Take 1 capsule by mouth daily.     oxyCODONE-acetaminophen (PERCOCET) 5-325 MG tablet Take 1 tablet by mouth every 6 (six) hours as needed for severe pain. 15 tablet 0   pantoprazole (PROTONIX) 40 MG tablet TAKE ONE TABLET DAILY 90 tablet 0   tamsulosin (FLOMAX) 0.4 MG CAPS capsule TAKE ONE CAPSULE EACH DAY (Patient taking differently: Take 0.4 mg by mouth daily. TAKE ONE CAPSULE EACH DAY) 90 capsule 3   TURMERIC CURCUMIN PO Take 500 mg  by mouth daily.     vitamin B-12 (CYANOCOBALAMIN) 1000 MCG tablet Take 1,000 mcg by mouth daily.     glucose blood test strip Use to check blood sugar 2 times per day. DX Code: E11.9 (Patient not taking: Reported on 01/31/2021) 200 each 2   No current facility-administered medications on file prior to visit.    LABS/IMAGING: No results found for this or any previous visit (from the past 48 hour(s)). No results found.  LIPID PANEL:    Component Value Date/Time   CHOL 201 (H) 11/21/2020 0428   TRIG 230 (H) 11/21/2020 0428   HDL 37 (L) 11/21/2020 0428   CHOLHDL 5.4 11/21/2020 0428   VLDL 46 (H) 11/21/2020 0428   LDLCALC 118 (H) 11/21/2020 0428   LDLDIRECT 169.0 10/23/2020 0937    WEIGHTS: Wt Readings from Last 3 Encounters:  01/31/21 252 lb 3.2 oz (114.4 kg)  12/19/20 246 lb 12.8 oz (111.9 kg)  11/28/20 244 lb (110.7 kg)    VITALS: BP (!) 118/56   Pulse 73   Ht '5\' 11"'  (1.803 m)   Wt 252 lb 3.2 oz (114.4 kg)   SpO2 95%   BMI 35.17 kg/m   EXAM: Deferred  EKG: N/A  ASSESSMENT: Mixed dyslipidemia, goal LDL less than 70 History of stroke Bilateral carotid artery stenosis status post left TCAR Type 2 diabetes Hypertension Statin intolerant-myalgias  PLAN: 1.   Mr. Dunavan has a mixed dyslipidemia and remains above target LDL less than 70.  He has a history of statin intolerance causing myalgias.  He is currently on ezetimibe.  He is a good candidate for PCSK9 inhibitor to reach his target.   Would recommend Repatha 140 mg every 2 weeks.  He is given himself allergy shots previously and feels comfortable with this process.  Plan repeat lipid profile in about 3 to 4 months after starting therapy.  Follow-up with me with me at that time.  Thanks again for the kind referral.  Pixie Casino, MD, FACC, [place] Director of the Advanced Lipid Disorders &  Cardiovascular Risk Reduction Clinic Diplomate of the American Board of Clinical Lipidology Attending Cardiologist  Direct Dial: [phone]  Fax: [phone]  Website:  [email or web address] 01/31/2021, 11:50 AM

## 2021-01-31 NOTE — Patient Instructions (Signed)
Medication Instructions:  Dr. Debara Pickett recommends Repatha 140mg /mL (PCSK9). This is an injectable cholesterol medication self-administered once every 14 days. This medication will likely need prior approval with your insurance company, which we will work on. If the medication is not approved initially, we may need to do an appeal with your insurance.   Administer medication in area of fatty tissue such as abdomen, outer thigh, back of upper arm - and rotate site with each injection Store medication in refrigerator until ready to administer - allow to sit at room temp for 30 mins - 1 hour prior to injection Dispose of medication in a SHARPS container - your pharmacy should be able to direct you on this and proper disposal   If you need a co-pay card for Repatha: [email or web address] >> paying for Repatha or red box that says "[place]" in top right If you need a co-pay card for Praluent: WedMap.it >> starting & paying for Praluent  Patient Assistance:   The PAN Foundation: https://www.panfoundation.org/disease-funds/hypercholesterolemia/ -- can sign up for wait list  The [employer] Well foundation offers assistance to help pay for medication copays.  They will cover copays for all cholesterol lowering meds, including statins, fibrates, omega-3 fish oils like Vascepa, ezetimibe, Repatha, Praluent, Nexletol, Nexlizet.  The cards are usually good for $2,500 or 12 months, whichever comes first. Go to healthwellfoundation.org Click on "Apply Now" Answer questions as to whom is applying (patient or representative) Your disease fund will be "hypercholesterolemia - Medicare access" They will ask questions about finances and which medications you are taking for cholesterol When you submit, the approval is usually within minutes.  You will need to print the card information from the site You will need to show this information to your pharmacy, they will bill your Medicare Part D plan first -then bill Health  Well --for the copay.   You can also call them at [phone], although the hold times can be quite long.     *If you need a refill on your cardiac medications before your next appointment, please call your pharmacy*   Lab Work: FASTING lab work in about 4 months - complete about 1 week before your next visit  If you have labs (blood work) drawn today and your tests are completely normal, you will receive your results only by: [place] (if you have MyChart) OR A paper copy in the mail If you have any lab test that is abnormal or we need to change your treatment, we will call you to review the results.   Testing/Procedures: NONE   Follow-Up: At [facility], you and your health needs are our priority.  As part of our continuing mission to provide you with exceptional heart care, we have created designated Provider Care Teams.  These Care Teams include your primary Cardiologist (physician) and Advanced Practice Providers (APPs -  Physician Assistants and Nurse Practitioners) who all work together to provide you with the care you need, when you need it.  We recommend signing up for the patient portal called "MyChart".  Sign up information is provided on this After Visit Summary.  MyChart is used to connect with patients for Virtual Visits (Telemedicine).  Patients are able to view lab/test results, encounter notes, upcoming appointments, etc.  Non-urgent messages can be sent to your provider as well.   To learn more about what you can do with MyChart, go to [email or web address].    Your next appointment:   4 month(s) - lipid clinic  The format  for your next appointment:   In Person  Provider:   K. Mali Hilty, MD   Other Instructions

## 2021-02-01 ENCOUNTER — Telehealth: Payer: Self-pay | Admitting: Internal Medicine

## 2021-02-01 DIAGNOSIS — L308 Other specified dermatitis: Secondary | ICD-10-CM | POA: Diagnosis not present

## 2021-02-01 DIAGNOSIS — L812 Freckles: Secondary | ICD-10-CM | POA: Diagnosis not present

## 2021-02-01 DIAGNOSIS — L570 Actinic keratosis: Secondary | ICD-10-CM | POA: Diagnosis not present

## 2021-02-01 DIAGNOSIS — Z85828 Personal history of other malignant neoplasm of skin: Secondary | ICD-10-CM | POA: Diagnosis not present

## 2021-02-01 DIAGNOSIS — L218 Other seborrheic dermatitis: Secondary | ICD-10-CM | POA: Diagnosis not present

## 2021-02-01 DIAGNOSIS — L821 Other seborrheic keratosis: Secondary | ICD-10-CM | POA: Diagnosis not present

## 2021-02-01 NOTE — Telephone Encounter (Signed)
PA for repatha sureclick submitted via CMM (Key: [id number])

## 2021-02-01 NOTE — Telephone Encounter (Signed)
Request Reference Number: [id number]. REPATHA SURE INJ 140MG /ML is approved through 08/01/2021

## 2021-02-08 ENCOUNTER — Other Ambulatory Visit: Payer: Self-pay | Admitting: Internal Medicine

## 2021-02-08 ENCOUNTER — Other Ambulatory Visit: Payer: Self-pay | Admitting: Endocrinology

## 2021-02-08 DIAGNOSIS — N182 Chronic kidney disease, stage 2 (mild): Secondary | ICD-10-CM

## 2021-02-08 NOTE — Telephone Encounter (Signed)
Please refill as per office routine med refill policy (all routine meds to be refilled for 3 mo or monthly (per pt preference) up to one year from last visit, then month to month grace period for 3 mo, then further med refills will have to be denied) ? ?

## 2021-02-09 NOTE — Telephone Encounter (Signed)
   Notes to clinic:  Patient last seen 02/2019 Appointment has been scheduled for 03/19/2021 Review for refill until that time    Requested Prescriptions  Pending Prescriptions Disp Refills   bromocriptine (PARLODEL) 2.5 MG tablet [Pharmacy Med Name: BROMOCRIPTINE MESYLATE 2.5 MG TAB] 45 tablet 3    Sig: TAKE ONE-HALF TABLET DAILY     There is no refill protocol information for this order

## 2021-03-09 ENCOUNTER — Ambulatory Visit (INDEPENDENT_AMBULATORY_CARE_PROVIDER_SITE_OTHER): Payer: Medicare Other

## 2021-03-09 ENCOUNTER — Other Ambulatory Visit: Payer: Self-pay

## 2021-03-09 DIAGNOSIS — Z23 Encounter for immunization: Secondary | ICD-10-CM

## 2021-03-09 NOTE — Progress Notes (Signed)
High Dose flu vacc given w/o any complications.

## 2021-03-14 ENCOUNTER — Other Ambulatory Visit: Payer: Self-pay | Admitting: Endocrinology

## 2021-03-19 ENCOUNTER — Ambulatory Visit (INDEPENDENT_AMBULATORY_CARE_PROVIDER_SITE_OTHER): Payer: Medicare Other | Admitting: Endocrinology

## 2021-03-19 ENCOUNTER — Other Ambulatory Visit: Payer: Self-pay

## 2021-03-19 VITALS — BP 140/60 | HR 88 | Ht 71.0 in | Wt 251.0 lb

## 2021-03-19 DIAGNOSIS — N182 Chronic kidney disease, stage 2 (mild): Secondary | ICD-10-CM | POA: Diagnosis not present

## 2021-03-19 DIAGNOSIS — E1122 Type 2 diabetes mellitus with diabetic chronic kidney disease: Secondary | ICD-10-CM

## 2021-03-19 LAB — POCT GLYCOSYLATED HEMOGLOBIN (HGB A1C): Hemoglobin A1C: 6.4 % — AB (ref 4.0–5.6)

## 2021-03-19 MED ORDER — DAPAGLIFLOZIN PROPANEDIOL 5 MG PO TABS: 5.0000 mg | ORAL_TABLET | Freq: Every day | ORAL | 3 refills | Status: DC

## 2021-03-19 MED ORDER — BROMOCRIPTINE MESYLATE 2.5 MG PO TABS: 1.2500 mg | ORAL_TABLET | Freq: Every day | ORAL | 3 refills | Status: DC

## 2021-03-19 MED ORDER — NATEGLINIDE 60 MG PO TABS: ORAL_TABLET | ORAL | 3 refills | Status: DC

## 2021-03-19 MED ORDER — SITAGLIPTIN PHOSPHATE 100 MG PO TABS: 50.0000 mg | ORAL_TABLET | Freq: Every day | ORAL | 3 refills | Status: DC

## 2021-03-19 NOTE — Patient Instructions (Addendum)
Please continue the same 4 diabetes medications.  ?check your blood sugar once a day.  vary the time of day when you check, between before the 3 meals, and at bedtime.  also check if you have symptoms of your blood sugar being too high or too low.  please keep a record of the readings and bring it to your next appointment here (or you can bring the meter itself).  You can write it on any piece of paper.  please call us sooner if your blood sugar goes below 70, or if you have a lot of readings over 200.   ?Please come back for a follow-up appointment in 6 months.   ?

## 2021-03-19 NOTE — Progress Notes (Signed)
Subjective:    Patient ID: Joseph Villa, male    DOB: 07/16/1940, 80 y.o.   MRN: [id number]  HPI Pt returns for f/u of diabetes mellitus:  DM type: 2 Dx'ed: 2007.   Complications: PN, PAD, and stage 3 CRI.   Therapy: 4 oral meds.  DKA: never.   Severe hypoglycemia: never.  Pancreatitis: never.  Other: he has never been on insulin, but he has learned how; he did not tolerate metformin-XR (diarrhea); he cannot take pioglitizone, due to edema; renal insufficiency also limits rx options. He did not tolerate repaglinide (constipation).   Interval history: He says cbg's are well-controlled.  pt states he feels well in general.  Pt says he takes meds as rx'ed.   Past Medical History:  Diagnosis Date   ALLERGIC ASTHMA 11/25/2007   ALLERGIC RHINITIS 11/25/2007   Allergy    Arthritis, lumbar spine 04/18/2011   BIPOLAR DISORDER UNSPECIFIED 10/06/2009   Blood transfusion without reported diagnosis    s/p goiter surgery age 89    COLONIC POLYPS, HX OF 10/06/2009   Degenerative arthritis of hip 04/18/2011   DJD (degenerative joint disease)    DM w/o Complication Type II 40/81/4481   GERD 10/06/2009   Hemorrhage yrs ago   after goiter surgery, tracheostomy inserted and later removed   Hemorrhoid    History of kidney stones    HYPERLIPIDEMIA 10/06/2009   HYPERTENSION, BENIGN 07/27/2009   HYPOTHYROIDISM 10/06/2009   NEPHROLITHIASIS, HX OF 10/06/2009   OBSTRUCTIVE SLEEP APNEA 11/25/2007   cpap setting of 9   Peptic stricture of esophagus    RBBB (right bundle branch block)    Sleep apnea    wears c pap   Stroke [facility])     Past Surgical History:  Procedure Laterality Date   COLONOSCOPY     GANGLION CYST EXCISION     2-3 cysts removed   HERNIA REPAIR     JOINT REPLACEMENT     thyroid Goiter surgery     TONSILLECTOMY     TONSILLECTOMY     TOTAL HIP ARTHROPLASTY  06/16/2012   Procedure: TOTAL HIP ARTHROPLASTY ANTERIOR APPROACH;  Surgeon: Mauri Pole, MD;  Location: [facility];   Service: Orthopedics;  Laterality: Left;   TRANSCAROTID ARTERY REVASCULARIZATION  Left 11/20/2020   Procedure: LEFT TRANSCAROTID ARTERY REVASCULARIZATION;  Surgeon: Marty Heck, MD;  Location: [facility] OR;  Service: Vascular;  Laterality: Left;   UMBILICAL HERNIA REPAIR  several yrs ago   UPPER GASTROINTESTINAL ENDOSCOPY      Social History   Socioeconomic History   Marital status: Married    Spouse name: Not on file   Number of children: 3   Years of education: Not on file   Highest education level: Not on file  Occupational History   Occupation: retired  Tobacco Use   Smoking status: Former    Packs/day: 1.50    Years: 15.00    Pack years: 22.50    Types: Cigarettes, Pipe    Quit date: 06/23/1977    Years since quitting: 43.7   Smokeless tobacco: Never  Vaping Use   Vaping Use: Never used  Substance and Sexual Activity   Alcohol use: No    Alcohol/week: 0.0 standard drinks   Drug use: No   Sexual activity: Not Currently  Other Topics Concern   Not on file  Social History Narrative   Not on file   Social Determinants of Health   Financial Resource Strain: Not on file  Food Insecurity: Not on file  Transportation Needs: Not on file  Physical Activity: Not on file  Stress: Not on file  Social Connections: Not on file  Intimate Partner Violence: Not on file    Current Outpatient Medications on File Prior to Visit  Medication Sig Dispense Refill   Accu-Chek FastClix Lancets MISC 2 (two) times daily.     ACCU-CHEK GUIDE test strip CHECK BLOOD GLUCOSE (SUGAR) TWICE DAILY 200 each 3   aspirin 81 MG chewable tablet Chew 1 tablet (81 mg total) by mouth daily. 90 tablet 3   carbamazepine (TEGRETOL) 200 MG tablet TAKE 2 TABLETS AT BEDTIME (Patient taking differently: Take 400 mg by mouth at bedtime.) 180 tablet 1   Cholecalciferol (VITAMIN D3) 2000 UNITS TABS Take 2,000 Units by mouth every morning.      clopidogrel (PLAVIX) 75 MG tablet Take 1 tablet (75 mg total) by  mouth daily. 30 tablet 3   desoximetasone (TOPICORT) 0.25 % cream Apply 1 application topically 2 (two) times daily. (Patient taking differently: Apply 1 application topically 2 (two) times daily as needed (psoriasis).) 100 g 0   Evolocumab (REPATHA SURECLICK) 119 MG/ML SOAJ Inject 1 Dose into the skin every 14 (fourteen) days. 2 mL 11   ezetimibe (ZETIA) 10 MG tablet Take 1 tablet (10 mg total) by mouth daily. 30 tablet 2   fluticasone (FLONASE) 50 MCG/ACT nasal spray ONE SPRAY INTO NOSE EVERY DAY (Patient taking differently: Place 2 sprays into both nostrils daily.) 48 g 1   glucose blood (ACCU-CHEK AVIVA PLUS) test strip Used to check blood sugars twice a day Dx E11.9 200 each 0   glucose blood test strip Use to check blood sugar 2 times per day. DX Code: E11.9 200 each 2   hydrochlorothiazide (HYDRODIURIL) 25 MG tablet TAKE ONE TABLET DAILY 90 tablet 1   Lancets Misc. (ACCU-CHEK FASTCLIX LANCET) KIT AS DIRECTED 1 kit 0   levothyroxine (SYNTHROID) 112 MCG tablet TAKE ONE TABLET EACH DAY 90 tablet 2   Multiple Vitamins-Minerals (ICAPS AREDS FORMULA PO) Take 2 capsules by mouth daily.     olmesartan (BENICAR) 40 MG tablet TAKE ONE TABLET DAILY 90 tablet 1   Omega-3 Fatty Acids (FISH OIL) 1200 MG CAPS Take 1 capsule by mouth daily.     oxyCODONE-acetaminophen (PERCOCET) 5-325 MG tablet Take 1 tablet by mouth every 6 (six) hours as needed for severe pain. 15 tablet 0   pantoprazole (PROTONIX) 40 MG tablet TAKE ONE TABLET DAILY 90 tablet 0   tamsulosin (FLOMAX) 0.4 MG CAPS capsule TAKE ONE CAPSULE EACH DAY (Patient taking differently: Take 0.4 mg by mouth daily. TAKE ONE CAPSULE EACH DAY) 90 capsule 3   TURMERIC CURCUMIN PO Take 500 mg by mouth daily.     vitamin B-12 (CYANOCOBALAMIN) 1000 MCG tablet Take 1,000 mcg by mouth daily.     No current facility-administered medications on file prior to visit.    Allergies  Allergen Reactions   Cefuroxime Axetil Anaphylaxis    REACTION:  anaphylaxis-ceftin    Cefprozil     REACTION: unknown   Cephalosporins Nausea Only   Crestor [Rosuvastatin Calcium]     Muscle aches    Lipitor [Atorvastatin]     myalgiaas   Metformin And Related Diarrhea   Rabeprazole Sodium Nausea Only    REACTION: nausea    Family History  Problem Relation Age of Onset   Throat cancer Mother    Alcohol abuse Brother    Diabetes Brother  Drug abuse Daughter    Alcoholism Other        uncle   Breast cancer Father    Colon cancer Neg Hx    Colon polyps Neg Hx    Esophageal cancer Neg Hx    Rectal cancer Neg Hx    Stomach cancer Neg Hx     BP 140/60 (BP Location: Right Arm, Patient Position: Sitting, Cuff Size: Large)   Pulse 88   Ht _0  (1.803 m)   Wt 251 lb (113.9 kg)   SpO2 97%   BMI 35.01 kg/m    Review of Systems He denies hypoglycemia.  He has intermitt nausea    Objective:   Physical Exam Pulses: dorsalis pedis intact bilat.   MSK: no deformity of the feet CV: no leg edema Skin:  no ulcer on the feet.  normal color and temp on the feet.  Neuro: sensation is intact to touch on the feet.     Lab Results  Component Value Date   CREATININE 1.67 (H) 11/21/2020   BUN 44 (H) 11/21/2020   NA 139 11/21/2020   K 4.6 11/21/2020   CL 105 11/21/2020   CO2 20 (L) 11/21/2020   Lab Results  Component Value Date   HGBA1C 6.4 (A) 03/19/2021      Assessment & Plan:  Type 2 DM: well-controlled Nausea, due to Januvia.  We discussed.  as it is mild, we decided to continue the same.    Patient Instructions  Please continue the same 4 diabetes medications.  check your blood sugar once a day.  vary the time of day when you check, between before the 3 meals, and at bedtime.  also check if you have symptoms of your blood sugar being too high or too low.  please keep a record of the readings and bring it to your next appointment here (or you can bring the meter itself).  You can write it on any piece of paper.  please call us  sooner if your blood sugar goes below 70, or if you have a lot of readings over 200.   Please come back for a follow-up appointment in 6 months.

## 2021-03-30 DIAGNOSIS — G4733 Obstructive sleep apnea (adult) (pediatric): Secondary | ICD-10-CM | POA: Diagnosis not present

## 2021-04-06 ENCOUNTER — Other Ambulatory Visit: Payer: Self-pay | Admitting: Internal Medicine

## 2021-04-06 NOTE — Telephone Encounter (Signed)
Please refill as per office routine med refill policy (all routine meds to be refilled for 3 mo or monthly (per pt preference) up to one year from last visit, then month to month grace period for 3 mo, then further med refills will have to be denied) ? ?

## 2021-04-10 ENCOUNTER — Telehealth: Payer: Self-pay | Admitting: Endocrinology

## 2021-04-10 ENCOUNTER — Other Ambulatory Visit: Payer: Self-pay | Admitting: Endocrinology

## 2021-04-10 MED ORDER — SITAGLIPTIN PHOSPHATE 50 MG PO TABS: 50.0000 mg | ORAL_TABLET | Freq: Every day | ORAL | 3 refills | Status: DC

## 2021-04-10 NOTE — Telephone Encounter (Signed)
Pt spouse calling in: Pt has been feeling nauseas from taking Januvia. Pt requests the dosage be dropped down to 50mg  instead of 1000mg .  [facility] - [place], [place] - [address]  [address], [place] [address]   Pt contact [phone]

## 2021-04-10 NOTE — Telephone Encounter (Signed)
Message sent thru MyChart 

## 2021-05-02 ENCOUNTER — Telehealth: Payer: Self-pay | Admitting: Podiatrist

## 2021-05-02 NOTE — Telephone Encounter (Signed)
Pt left message yesterday afternoon with just his name and number.  I returned call today and pt was asking when he got his last diabetic shoes. That if he could get some this month if it has been long enough. I explained to pt that we are not able to get the diabetic shoes for this year because it takes 4 to 6 weeks to get them back in time. I explained that they would be for next year shoes He said he would call back next year then. I did tell him that he would have to see a provider to do a diabetic foot exam and qualify pt for the shoes and to ask if we could do the doctor and diabetic shoe measurements on the same day if possible.

## 2021-05-11 ENCOUNTER — Telehealth: Payer: Self-pay | Admitting: Internal Medicine

## 2021-05-11 ENCOUNTER — Other Ambulatory Visit: Payer: Self-pay | Admitting: Endocrinology

## 2021-05-11 ENCOUNTER — Other Ambulatory Visit: Payer: Self-pay | Admitting: Internal Medicine

## 2021-05-11 MED ORDER — NIRMATRELVIR/RITONAVIR (PAXLOVID) TABLET (RENAL DOSING): 2.0000 | ORAL_TABLET | Freq: Two times a day (BID) | ORAL | 0 refills | Status: DC

## 2021-05-11 NOTE — Telephone Encounter (Signed)
Ok paxlovid sent to pharmacy 

## 2021-05-11 NOTE — Telephone Encounter (Signed)
Patients wife notified

## 2021-05-11 NOTE — Telephone Encounter (Signed)
Patient's spouse Arbie Cookey states she and patient tested positive for Covid on 12-8  Caller is requesting an antiviral rx  Offered caller vv for 12-12, caller declined  Caller requesting a call back to discuss otc vitamins

## 2021-05-12 ENCOUNTER — Encounter (HOSPITAL_COMMUNITY): Payer: Self-pay | Admitting: Emergency Medicine

## 2021-05-12 ENCOUNTER — Other Ambulatory Visit: Payer: Self-pay

## 2021-05-12 ENCOUNTER — Emergency Department (HOSPITAL_COMMUNITY): Payer: Medicare Other

## 2021-05-12 ENCOUNTER — Inpatient Hospital Stay (HOSPITAL_COMMUNITY): Payer: Medicare Other

## 2021-05-12 ENCOUNTER — Inpatient Hospital Stay (HOSPITAL_COMMUNITY)
Admission: EM | Admit: 2021-05-12 | Discharge: 2021-05-18 | DRG: 177 | Disposition: A | Discharge status: Home Health | Payer: Medicare Other | Attending: Internal Medicine | Admitting: Internal Medicine

## 2021-05-12 DIAGNOSIS — E1165 Type 2 diabetes mellitus with hyperglycemia: Secondary | ICD-10-CM | POA: Diagnosis present

## 2021-05-12 DIAGNOSIS — E1159 Type 2 diabetes mellitus with other circulatory complications: Secondary | ICD-10-CM | POA: Diagnosis not present

## 2021-05-12 DIAGNOSIS — J9601 Acute respiratory failure with hypoxia: Secondary | ICD-10-CM | POA: Diagnosis present

## 2021-05-12 DIAGNOSIS — G4733 Obstructive sleep apnea (adult) (pediatric): Secondary | ICD-10-CM | POA: Diagnosis present

## 2021-05-12 DIAGNOSIS — E872 Acidosis, unspecified: Secondary | ICD-10-CM | POA: Diagnosis present

## 2021-05-12 DIAGNOSIS — Y92239 Unspecified place in hospital as the place of occurrence of the external cause: Secondary | ICD-10-CM | POA: Diagnosis present

## 2021-05-12 DIAGNOSIS — Z7902 Long term (current) use of antithrombotics/antiplatelets: Secondary | ICD-10-CM

## 2021-05-12 DIAGNOSIS — Z8673 Personal history of transient ischemic attack (TIA), and cerebral infarction without residual deficits: Secondary | ICD-10-CM

## 2021-05-12 DIAGNOSIS — N179 Acute kidney failure, unspecified: Secondary | ICD-10-CM | POA: Diagnosis present

## 2021-05-12 DIAGNOSIS — E1122 Type 2 diabetes mellitus with diabetic chronic kidney disease: Secondary | ICD-10-CM | POA: Diagnosis present

## 2021-05-12 DIAGNOSIS — I6523 Occlusion and stenosis of bilateral carotid arteries: Secondary | ICD-10-CM | POA: Diagnosis present

## 2021-05-12 DIAGNOSIS — Z888 Allergy status to other drugs, medicaments and biological substances status: Secondary | ICD-10-CM | POA: Diagnosis not present

## 2021-05-12 DIAGNOSIS — J159 Unspecified bacterial pneumonia: Secondary | ICD-10-CM | POA: Diagnosis present

## 2021-05-12 DIAGNOSIS — Z7982 Long term (current) use of aspirin: Secondary | ICD-10-CM

## 2021-05-12 DIAGNOSIS — F39 Unspecified mood [affective] disorder: Secondary | ICD-10-CM | POA: Diagnosis present

## 2021-05-12 DIAGNOSIS — Z96642 Presence of left artificial hip joint: Secondary | ICD-10-CM | POA: Diagnosis present

## 2021-05-12 DIAGNOSIS — T380X5A Adverse effect of glucocorticoids and synthetic analogues, initial encounter: Secondary | ICD-10-CM | POA: Diagnosis present

## 2021-05-12 DIAGNOSIS — I152 Hypertension secondary to endocrine disorders: Secondary | ICD-10-CM | POA: Diagnosis present

## 2021-05-12 DIAGNOSIS — E669 Obesity, unspecified: Secondary | ICD-10-CM | POA: Diagnosis present

## 2021-05-12 DIAGNOSIS — Z7984 Long term (current) use of oral hypoglycemic drugs: Secondary | ICD-10-CM

## 2021-05-12 DIAGNOSIS — Z6834 Body mass index (BMI) 34.0-34.9, adult: Secondary | ICD-10-CM

## 2021-05-12 DIAGNOSIS — U071 COVID-19: Secondary | ICD-10-CM | POA: Diagnosis present

## 2021-05-12 DIAGNOSIS — M7989 Other specified soft tissue disorders: Secondary | ICD-10-CM | POA: Diagnosis not present

## 2021-05-12 DIAGNOSIS — N400 Enlarged prostate without lower urinary tract symptoms: Secondary | ICD-10-CM | POA: Diagnosis present

## 2021-05-12 DIAGNOSIS — E785 Hyperlipidemia, unspecified: Secondary | ICD-10-CM | POA: Diagnosis present

## 2021-05-12 DIAGNOSIS — E039 Hypothyroidism, unspecified: Secondary | ICD-10-CM | POA: Diagnosis present

## 2021-05-12 DIAGNOSIS — Z79899 Other long term (current) drug therapy: Secondary | ICD-10-CM

## 2021-05-12 DIAGNOSIS — R0609 Other forms of dyspnea: Secondary | ICD-10-CM | POA: Diagnosis not present

## 2021-05-12 DIAGNOSIS — E875 Hyperkalemia: Secondary | ICD-10-CM | POA: Diagnosis present

## 2021-05-12 DIAGNOSIS — E1169 Type 2 diabetes mellitus with other specified complication: Secondary | ICD-10-CM | POA: Diagnosis present

## 2021-05-12 DIAGNOSIS — N1832 Chronic kidney disease, stage 3b: Secondary | ICD-10-CM | POA: Diagnosis present

## 2021-05-12 DIAGNOSIS — Z808 Family history of malignant neoplasm of other organs or systems: Secondary | ICD-10-CM

## 2021-05-12 DIAGNOSIS — R0602 Shortness of breath: Secondary | ICD-10-CM

## 2021-05-12 DIAGNOSIS — I4510 Unspecified right bundle-branch block: Secondary | ICD-10-CM | POA: Diagnosis present

## 2021-05-12 DIAGNOSIS — Z833 Family history of diabetes mellitus: Secondary | ICD-10-CM

## 2021-05-12 DIAGNOSIS — J1282 Pneumonia due to coronavirus disease 2019: Secondary | ICD-10-CM | POA: Diagnosis present

## 2021-05-12 DIAGNOSIS — Z803 Family history of malignant neoplasm of breast: Secondary | ICD-10-CM

## 2021-05-12 DIAGNOSIS — K7460 Unspecified cirrhosis of liver: Secondary | ICD-10-CM | POA: Diagnosis present

## 2021-05-12 DIAGNOSIS — Z811 Family history of alcohol abuse and dependence: Secondary | ICD-10-CM

## 2021-05-12 DIAGNOSIS — N183 Chronic kidney disease, stage 3 unspecified: Secondary | ICD-10-CM | POA: Diagnosis present

## 2021-05-12 LAB — I-STAT VENOUS BLOOD GAS, ED
Acid-base deficit: 6 mmol/L — ABNORMAL HIGH (ref 0.0–2.0)
Bicarbonate: 19.6 mmol/L — ABNORMAL LOW (ref 20.0–28.0)
Calcium, Ion: 1.14 mmol/L — ABNORMAL LOW (ref 1.15–1.40)
HCT: 34 % — ABNORMAL LOW (ref 39.0–52.0)
Hemoglobin: 11.6 g/dL — ABNORMAL LOW (ref 13.0–17.0)
O2 Saturation: 54 %
Potassium: 4.6 mmol/L (ref 3.5–5.1)
Sodium: 137 mmol/L (ref 135–145)
TCO2: 21 mmol/L — ABNORMAL LOW (ref 22–32)
pCO2, Ven: 39.4 mmHg — ABNORMAL LOW (ref 44.0–60.0)
pH, Ven: 7.306 (ref 7.250–7.430)
pO2, Ven: 31 mmHg — CL (ref 32.0–45.0)

## 2021-05-12 LAB — CBC WITH DIFFERENTIAL/PLATELET
Abs Immature Granulocytes: 0.11 10*3/uL — ABNORMAL HIGH (ref 0.00–0.07)
Basophils Absolute: 0.1 10*3/uL (ref 0.0–0.1)
Basophils Relative: 0 %
Eosinophils Absolute: 0.7 10*3/uL — ABNORMAL HIGH (ref 0.0–0.5)
Eosinophils Relative: 4 %
HCT: 33.7 % — ABNORMAL LOW (ref 39.0–52.0)
Hemoglobin: 10.8 g/dL — ABNORMAL LOW (ref 13.0–17.0)
Immature Granulocytes: 1 %
Lymphocytes Relative: 6 %
Lymphs Abs: 1 10*3/uL (ref 0.7–4.0)
MCH: 29.9 pg (ref 26.0–34.0)
MCHC: 32 g/dL (ref 30.0–36.0)
MCV: 93.4 fL (ref 80.0–100.0)
Monocytes Absolute: 1 10*3/uL (ref 0.1–1.0)
Monocytes Relative: 6 %
Neutro Abs: 12.8 10*3/uL — ABNORMAL HIGH (ref 1.7–7.7)
Neutrophils Relative %: 83 %
Platelets: 217 10*3/uL (ref 150–400)
RBC: 3.61 MIL/uL — ABNORMAL LOW (ref 4.22–5.81)
RDW: 13.2 % (ref 11.5–15.5)
WBC: 15.7 10*3/uL — ABNORMAL HIGH (ref 4.0–10.5)
nRBC: 0 % (ref 0.0–0.2)

## 2021-05-12 LAB — URINALYSIS, ROUTINE W REFLEX MICROSCOPIC
Bilirubin Urine: NEGATIVE
Glucose, UA: 150 mg/dL — AB
Hgb urine dipstick: NEGATIVE
Ketones, ur: NEGATIVE mg/dL
Leukocytes,Ua: NEGATIVE
Nitrite: NEGATIVE
Protein, ur: 30 mg/dL — AB
Specific Gravity, Urine: 1.018 (ref 1.005–1.030)
pH: 5 (ref 5.0–8.0)

## 2021-05-12 LAB — RESP PANEL BY RT-PCR (FLU A&B, COVID) ARPGX2
Influenza A by PCR: NEGATIVE
Influenza B by PCR: NEGATIVE
SARS Coronavirus 2 by RT PCR: POSITIVE — AB

## 2021-05-12 LAB — BASIC METABOLIC PANEL
Anion gap: 11 (ref 5–15)
BUN: 65 mg/dL — ABNORMAL HIGH (ref 8–23)
CO2: 16 mmol/L — ABNORMAL LOW (ref 22–32)
Calcium: 8.4 mg/dL — ABNORMAL LOW (ref 8.9–10.3)
Chloride: 106 mmol/L (ref 98–111)
Creatinine, Ser: 3.02 mg/dL — ABNORMAL HIGH (ref 0.61–1.24)
GFR, Estimated: 20 mL/min — ABNORMAL LOW (ref >60)
Glucose, Bld: 230 mg/dL — ABNORMAL HIGH (ref 70–99)
Potassium: 4.4 mmol/L (ref 3.5–5.1)
Sodium: 133 mmol/L — ABNORMAL LOW (ref 135–145)

## 2021-05-12 LAB — CBG MONITORING, ED: Glucose-Capillary: 169 mg/dL — ABNORMAL HIGH (ref 70–99)

## 2021-05-12 LAB — TROPONIN I (HIGH SENSITIVITY)
Troponin I (High Sensitivity): 32 ng/L — ABNORMAL HIGH (ref <18)
Troponin I (High Sensitivity): 32 ng/L — ABNORMAL HIGH (ref <18)

## 2021-05-12 MED ORDER — PANTOPRAZOLE SODIUM 40 MG PO TBEC
40.0000 mg | DELAYED_RELEASE_TABLET | Freq: Every day | ORAL | Status: DC
  Administered 2021-05-12 – 2021-05-18 (×7): 40 mg via ORAL
  Filled 2021-05-12 (×7): qty 1

## 2021-05-12 MED ORDER — SODIUM CHLORIDE 0.9 % IV BOLUS
1000.0000 mL | Freq: Once | INTRAVENOUS | Status: AC
  Administered 2021-05-12: 1000 mL via INTRAVENOUS

## 2021-05-12 MED ORDER — FLUTICASONE PROPIONATE 50 MCG/ACT NA SUSP
2.0000 | Freq: Every day | NASAL | Status: DC | PRN
  Filled 2021-05-12: qty 16

## 2021-05-12 MED ORDER — DEXAMETHASONE 6 MG PO TABS
6.0000 mg | ORAL_TABLET | ORAL | Status: DC
  Administered 2021-05-12: 6 mg via ORAL
  Filled 2021-05-12: qty 2
  Filled 2021-05-12: qty 1

## 2021-05-12 MED ORDER — CLOPIDOGREL BISULFATE 75 MG PO TABS
75.0000 mg | ORAL_TABLET | Freq: Every day | ORAL | Status: DC
  Administered 2021-05-12 – 2021-05-18 (×7): 75 mg via ORAL
  Filled 2021-05-12 (×8): qty 1

## 2021-05-12 MED ORDER — ACETAMINOPHEN 325 MG PO TABS
650.0000 mg | ORAL_TABLET | Freq: Four times a day (QID) | ORAL | Status: DC | PRN
  Administered 2021-05-13 (×2): 650 mg via ORAL
  Filled 2021-05-12 (×2): qty 2

## 2021-05-12 MED ORDER — NATEGLINIDE 60 MG PO TABS
30.0000 mg | ORAL_TABLET | Freq: Three times a day (TID) | ORAL | Status: DC
  Administered 2021-05-13: 30 mg via ORAL
  Filled 2021-05-12 (×2): qty 0.5

## 2021-05-12 MED ORDER — GUAIFENESIN-DM 100-10 MG/5ML PO SYRP
10.0000 mL | ORAL_SOLUTION | ORAL | Status: DC | PRN
  Administered 2021-05-12 – 2021-05-14 (×4): 10 mL via ORAL
  Filled 2021-05-12 (×4): qty 10

## 2021-05-12 MED ORDER — LEVOTHYROXINE SODIUM 112 MCG PO TABS
112.0000 ug | ORAL_TABLET | Freq: Every day | ORAL | Status: DC
  Administered 2021-05-13 – 2021-05-18 (×6): 112 ug via ORAL
  Filled 2021-05-12 (×6): qty 1

## 2021-05-12 MED ORDER — SODIUM CHLORIDE 0.9 % IV BOLUS: 500.0000 mL | Freq: Once | INTRAVENOUS | Status: DC

## 2021-05-12 MED ORDER — LEVOTHYROXINE SODIUM 112 MCG PO TABS: 112.0000 ug | ORAL_TABLET | Freq: Every day | ORAL | Status: DC

## 2021-05-12 MED ORDER — TAMSULOSIN HCL 0.4 MG PO CAPS
0.4000 mg | ORAL_CAPSULE | Freq: Every day | ORAL | Status: DC
  Administered 2021-05-12 – 2021-05-18 (×7): 0.4 mg via ORAL
  Filled 2021-05-12 (×7): qty 1

## 2021-05-12 MED ORDER — BROMOCRIPTINE MESYLATE 2.5 MG PO TABS
1.2500 mg | ORAL_TABLET | Freq: Every day | ORAL | Status: DC
  Administered 2021-05-12 – 2021-05-18 (×7): 1.25 mg via ORAL
  Filled 2021-05-12 (×7): qty 1

## 2021-05-12 MED ORDER — ALBUTEROL SULFATE HFA 108 (90 BASE) MCG/ACT IN AERS
2.0000 | INHALATION_SPRAY | Freq: Four times a day (QID) | RESPIRATORY_TRACT | Status: DC
  Administered 2021-05-12 – 2021-05-14 (×9): 2 via RESPIRATORY_TRACT
  Filled 2021-05-12: qty 6.7

## 2021-05-12 MED ORDER — ONDANSETRON HCL 4 MG PO TABS: 4.0000 mg | ORAL_TABLET | Freq: Four times a day (QID) | ORAL | Status: DC | PRN

## 2021-05-12 MED ORDER — CARBAMAZEPINE 200 MG PO TABS
400.0000 mg | ORAL_TABLET | Freq: Every day | ORAL | Status: DC
  Administered 2021-05-12 – 2021-05-17 (×6): 400 mg via ORAL
  Filled 2021-05-12 (×7): qty 2

## 2021-05-12 MED ORDER — VITAMIN D 25 MCG (1000 UNIT) PO TABS
2000.0000 [IU] | ORAL_TABLET | Freq: Every morning | ORAL | Status: DC
  Administered 2021-05-12 – 2021-05-18 (×7): 2000 [IU] via ORAL
  Filled 2021-05-12 (×7): qty 2

## 2021-05-12 MED ORDER — ONDANSETRON HCL 4 MG/2ML IJ SOLN: 4.0000 mg | Freq: Four times a day (QID) | INTRAMUSCULAR | Status: DC | PRN

## 2021-05-12 MED ORDER — ASPIRIN 81 MG PO CHEW
81.0000 mg | CHEWABLE_TABLET | Freq: Every day | ORAL | Status: DC
  Administered 2021-05-12 – 2021-05-18 (×7): 81 mg via ORAL
  Filled 2021-05-12 (×7): qty 1

## 2021-05-12 MED ORDER — INSULIN ASPART 100 UNIT/ML IJ SOLN
0.0000 [IU] | Freq: Three times a day (TID) | INTRAMUSCULAR | Status: DC
  Administered 2021-05-12: 3 [IU] via SUBCUTANEOUS

## 2021-05-12 MED ORDER — ALBUTEROL SULFATE HFA 108 (90 BASE) MCG/ACT IN AERS
1.0000 | INHALATION_SPRAY | Freq: Four times a day (QID) | RESPIRATORY_TRACT | Status: DC | PRN
  Filled 2021-05-12: qty 6.7

## 2021-05-12 MED ORDER — HEPARIN SODIUM (PORCINE) 5000 UNIT/ML IJ SOLN
5000.0000 [IU] | Freq: Three times a day (TID) | INTRAMUSCULAR | Status: DC
  Administered 2021-05-12 – 2021-05-18 (×18): 5000 [IU] via SUBCUTANEOUS
  Filled 2021-05-12 (×18): qty 1

## 2021-05-12 MED ORDER — ACETAMINOPHEN 500 MG PO TABS
500.0000 mg | ORAL_TABLET | Freq: Once | ORAL | Status: AC
  Administered 2021-05-12: 500 mg via ORAL
  Filled 2021-05-12: qty 1

## 2021-05-12 NOTE — ED Notes (Signed)
Report given to yellow RN

## 2021-05-12 NOTE — ED Notes (Signed)
Dinner tray ordered.

## 2021-05-12 NOTE — H&P (Signed)
History and Physical  Patient Name: Joseph Villa     BTD:[id number]    DOB: September 11, 1940    DOA: 05/12/2021 PCP: Biagio Borg, MD  Patient coming from: Home  Chief Complaint: Fever, dyspnea    HPI: Joseph Villa is a 80 y.o. male, with PMH of GERD, hypothyroidism, hypertension, OSA, diabetes, CKD 3b who presented to the ER on 05/12/2021 with with dyspnea, hypoxia, fever.  Patient is on the greatest historian, thus difficult to obtain complete and thorough HPI.  Apparently patient tested positive for COVID about a week ago, his wife did as well.  He has been complaining of cough, greenish-brown phlegm.  He has been spiking fevers at home.  His O2 sats have been in the mid 80s as well.  He does not use oxygen at home, but does use a CPAP at night.  He previously used inhalers but not currently.  He is vaccinated for COVID.  Yesterday his internal medicine doctor sent paxlovid prescription to the pharmacy.  Patient's wife noted he was more lethargic today so she brought him to the ER.  Wife also notes that he has not been eating or drinking that much.  Very fatigued and weak.   ED course: -Vitals on admission: Temperature 101.2 F, heart rate 85, blood pressure 153/52, respiratory rate 20, O2 sat 89% on 2 L -Labs on initial presentation: Sodium 133, potassium 4.4, chloride 106, bicarb 16, BUN 65, creatinine 3.02, glucose 230, WBC 15.7, hemoglobin 10.8, COVID-negative -Imaging obtained on admission: Chest x-ray on admission showing low lung volumes with crowding lung markers but no consolidation or pleural effusion.  CT head showed no acute processes -In the ED the patient was given Tylenol, 1 L of IV fluids, and the hospitalist service was contacted for further evaluation and management.     ROS: A complete and thorough 12 point review of systems obtained, negative listed in HPI.     Past Medical History:  Diagnosis Date   ALLERGIC ASTHMA 11/25/2007   ALLERGIC RHINITIS 11/25/2007    Allergy    Arthritis, lumbar spine 04/18/2011   BIPOLAR DISORDER UNSPECIFIED 10/06/2009   Blood transfusion without reported diagnosis    s/p goiter surgery age 74    COLONIC POLYPS, HX OF 10/06/2009   Degenerative arthritis of hip 04/18/2011   DJD (degenerative joint disease)    DM w/o Complication Type II 10/62/6948   GERD 10/06/2009   Hemorrhage yrs ago   after goiter surgery, tracheostomy inserted and later removed   Hemorrhoid    History of kidney stones    HYPERLIPIDEMIA 10/06/2009   HYPERTENSION, BENIGN 07/27/2009   HYPOTHYROIDISM 10/06/2009   NEPHROLITHIASIS, HX OF 10/06/2009   OBSTRUCTIVE SLEEP APNEA 11/25/2007   cpap setting of 9   Peptic stricture of esophagus    RBBB (right bundle branch block)    Sleep apnea    wears c pap   Stroke [facility])     Past Surgical History:  Procedure Laterality Date   COLONOSCOPY     GANGLION CYST EXCISION     2-3 cysts removed   HERNIA REPAIR     JOINT REPLACEMENT     thyroid Goiter surgery     TONSILLECTOMY     TONSILLECTOMY     TOTAL HIP ARTHROPLASTY  06/16/2012   Procedure: TOTAL HIP ARTHROPLASTY ANTERIOR APPROACH;  Surgeon: Mauri Pole, MD;  Location: [facility] ORS;  Service: Orthopedics;  Laterality: Left;   TRANSCAROTID ARTERY REVASCULARIZATION  Left 11/20/2020   Procedure:  LEFT TRANSCAROTID ARTERY REVASCULARIZATION;  Surgeon: Marty Heck, MD;  Location: [facility] OR;  Service: Vascular;  Laterality: Left;   UMBILICAL HERNIA REPAIR  several yrs ago   UPPER GASTROINTESTINAL ENDOSCOPY      Social History: Patient lives at home.  The patient walks without assistance.  nonsmoker.  Allergies  Allergen Reactions   Cefuroxime Axetil Anaphylaxis    REACTION: anaphylaxis-ceftin    Cefprozil     REACTION: unknown   Cephalosporins Nausea Only   Crestor [Rosuvastatin Calcium]     Muscle aches    Lipitor [Atorvastatin]     myalgiaas   Metformin And Related Diarrhea   Rabeprazole Sodium Nausea Only    REACTION: nausea     Family history: family history includes Alcohol abuse in his brother; Alcoholism in an other family member; Breast cancer in his father; Diabetes in his brother; Drug abuse in his daughter; Throat cancer in his mother.  Prior to Admission medications   Medication Sig Start Date End Date Taking? Authorizing Provider  sitaGLIPtin (JANUVIA) 50 MG tablet Take 1 tablet (50 mg total) by mouth daily. 04/10/21   Renato Shin, MD  Accu-Chek FastClix Lancets MISC USE TWICE DAILY 05/11/21   Renato Shin, MD  ACCU-CHEK GUIDE test strip CHECK BLOOD GLUCOSE (SUGAR) TWICE DAILY 03/14/21   Renato Shin, MD  aspirin 81 MG chewable tablet Chew 1 tablet (81 mg total) by mouth daily. 11/11/20   Pokhrel, Corrie Mckusick, MD  bromocriptine (PARLODEL) 2.5 MG tablet Take 0.5 tablets (1.25 mg total) by mouth daily. 03/19/21   Renato Shin, MD  carbamazepine (TEGRETOL) 200 MG tablet TAKE 2 TABLETS AT BEDTIME 05/11/21   Biagio Borg, MD  Cholecalciferol (VITAMIN D3) 2000 UNITS TABS Take 2,000 Units by mouth every morning.     [provider]  clopidogrel (PLAVIX) 75 MG tablet Take 1 tablet (75 mg total) by mouth daily. 11/11/20   Pokhrel, Corrie Mckusick, MD  dapagliflozin propanediol (FARXIGA) 5 MG TABS tablet Take 1 tablet (5 mg total) by mouth daily before breakfast. 03/19/21   Renato Shin, MD  desoximetasone (TOPICORT) 0.25 % cream Apply 1 application topically 2 (two) times daily. Patient taking differently: Apply 1 application topically 2 (two) times daily as needed (psoriasis). 11/13/18   Biagio Borg, MD  Evolocumab (REPATHA SURECLICK) 048 MG/ML SOAJ Inject 1 Dose into the skin every 14 (fourteen) days. 01/31/21   Hilty, Nadean Corwin, MD  ezetimibe (ZETIA) 10 MG tablet Take 1 tablet (10 mg total) by mouth daily. 11/11/20   Pokhrel, Corrie Mckusick, MD  fluticasone (FLONASE) 50 MCG/ACT nasal spray ONE SPRAY INTO NOSE EVERY DAY Patient taking differently: Place 2 sprays into both nostrils daily. 01/12/18   Biagio Borg, MD  glucose  blood (ACCU-CHEK AVIVA PLUS) test strip Used to check blood sugars twice a day Dx E11.9 07/24/16   Biagio Borg, MD  glucose blood test strip Use to check blood sugar 2 times per day. DX Code: E11.9 07/12/15   Renato Shin, MD  hydrochlorothiazide (HYDRODIURIL) 25 MG tablet TAKE ONE TABLET DAILY 12/21/20   Biagio Borg, MD  Lancets Misc. (ACCU-CHEK FASTCLIX LANCET) KIT AS DIRECTED 03/05/16   Biagio Borg, MD  levothyroxine (SYNTHROID) 112 MCG tablet TAKE ONE TABLET EACH DAY 12/14/20   Biagio Borg, MD  nateglinide (STARLIX) 60 MG tablet TAKE ONE-HALF TABLET 3 TIMES DAILY WITH MEALS 03/19/21   Renato Shin, MD  nirmatrelvir/ritonavir EUA, renal dosing, (PAXLOVID) 10 x 150 MG & 10 x 100MG TABS  Take 2 tablets by mouth 2 (two) times daily for 5 days. (Take nirmatrelvir 150 mg one tablet twice daily for 5 days and ritonavir 100 mg one tablet twice daily for 5 days) Patient GFR is 42 05/11/21 05/16/21  Biagio Borg, MD  olmesartan [facility]) 40 MG tablet TAKE ONE TABLET DAILY 12/21/20   Biagio Borg, MD  Omega-3 Fatty Acids (FISH OIL) 1200 MG CAPS Take 1 capsule by mouth daily.    [provider]  oxyCODONE-acetaminophen (PERCOCET) 5-325 MG tablet Take 1 tablet by mouth every 6 (six) hours as needed for severe pain. 11/21/20 11/21/21  Karoline Caldwell, PA-C  pantoprazole (PROTONIX) 40 MG tablet TAKE ONE TABLET DAILY 04/09/21   Biagio Borg, MD  tamsulosin (FLOMAX) 0.4 MG CAPS capsule TAKE ONE CAPSULE EACH DAY Patient taking differently: Take 0.4 mg by mouth daily. TAKE ONE CAPSULE EACH DAY 06/30/20   Biagio Borg, MD  TURMERIC CURCUMIN PO Take 500 mg by mouth daily.    [provider]  vitamin B-12 (CYANOCOBALAMIN) 1000 MCG tablet Take 1,000 mcg by mouth daily.    [provider]       Physical Exam: BP 134/61   Pulse 99   Temp 99.9 F (37.7 C) (Oral)   Resp (!) 23   Ht 5' 11.5" (1.816 m)   Wt 113.4 kg   SpO2 96%   BMI 34.38 kg/m   General appearance: Well-developed,  adult male, alert and in no acute distress .   Eyes: Anicteric, conjunctiva pink, lids and lashes normal. PERRL.    ENT: No nasal deformity, discharge, epistaxis.  Hearing poor, does not have hearing aids.  OP moist without lesions.   Neck: No neck masses.  Trachea midline.  No thyromegaly/tenderness. Lymph: No cervical or supraclavicular lymphadenopathy. Skin: Warm and dry.  No jaundice.  No suspicious rashes or lesions. Cardiac: RRR, nl S1-S2, no murmurs appreciated.  No LE edema.  Radial and pedal pulses 2+ and symmetric. Respiratory: Coarse breath sounds bilaterally, no wheezing Abdomen: Abdomen soft.  No tenderness with palpation. No ascites, distension, hepatosplenomegaly.   MSK: No deformities or effusions of the large joints of the upper or lower extremities bilaterally.  No cyanosis or clubbing. Neuro: Cranial nerves 2 through 12 grossly intact.  Sensation intact to light touch. Speech is fluent.  Marland Kitchen    Psych: Sensorium intact and responding to questions, attention normal.  Behavior appropriate.  Judgment and insight appear normal.    Labs on Admission:  I have personally reviewed following labs and imaging studies: CBC: Recent Labs  Lab 05/12/21 0904  WBC 15.7*  NEUTROABS 12.8*  HGB 10.8*  HCT 33.7*  MCV 93.4  PLT 517   Basic Metabolic Panel: Recent Labs  Lab 05/12/21 0904  NA 133*  K 4.4  CL 106  CO2 16*  GLUCOSE 230*  BUN 65*  CREATININE 3.02*  CALCIUM 8.4*   GFR: Estimated Creatinine Clearance: 26 mL/min (A) (by C-G formula based on SCr of 3.02 mg/dL (H)).  Liver Function Tests: No results for input(s): AST, ALT, ALKPHOS, BILITOT, PROT, ALBUMIN in the last 168 hours. No results for input(s): LIPASE, AMYLASE in the last 168 hours. No results for input(s): AMMONIA in the last 168 hours. Coagulation Profile: No results for input(s): INR, PROTIME in the last 168 hours. Cardiac Enzymes: No results for input(s): CKTOTAL, CKMB, CKMBINDEX, TROPONINI in the last  168 hours. BNP (last 3 results) No results for input(s): PROBNP in the last 8760 hours.  HbA1C: No results for input(s): HGBA1C in the last 72 hours. CBG: No results for input(s): GLUCAP in the last 168 hours. Lipid Profile: No results for input(s): CHOL, HDL, LDLCALC, TRIG, CHOLHDL, LDLDIRECT in the last 72 hours. Thyroid Function Tests: No results for input(s): TSH, T4TOTAL, FREET4, T3FREE, THYROIDAB in the last 72 hours. Anemia Panel: No results for input(s): VITAMINB12, FOLATE, FERRITIN, TIBC, IRON, RETICCTPCT in the last 72 hours.   Recent Results (from the past 240 hour(s))  Resp Panel by RT-PCR (Flu A&B, Covid) Nasopharyngeal Swab     Status: Abnormal   Collection Time: 05/12/21 10:31 AM   Specimen: Nasopharyngeal Swab; Nasopharyngeal(NP) swabs in vial transport medium  Result Value Ref Range Status   SARS Coronavirus 2 by RT PCR POSITIVE (A) NEGATIVE Final    Comment: RESULT CALLED TO, READ BACK BY AND VERIFIED WITH: RN C CHAPMAN [id number] AT 1610 BY CM (NOTE) SARS-CoV-2 target nucleic acids are DETECTED.  The SARS-CoV-2 RNA is generally detectable in upper respiratory specimens during the acute phase of infection. Positive results are indicative of the presence of the identified virus, but do not rule out bacterial infection or co-infection with other pathogens not detected by the test. Clinical correlation with patient history and other diagnostic information is necessary to determine patient infection status. The expected result is Negative.  Fact Sheet for Patients: EntrepreneurPulse.com.au  Fact Sheet for Healthcare Providers: IncredibleEmployment.be  This test is not yet approved or cleared by the [place] FDA and  has been authorized for detection and/or diagnosis of SARS-CoV-2 by FDA under an Emergency Use Authorization (EUA).  This EUA will remain in effect (meaning this test can be  used) for the duration of  the  COVID-19 declaration under Section 564(b)(1) of the Act, 21 U.S.C. section 360bbb-3(b)(1), unless the authorization is terminated or revoked sooner.     Influenza A by PCR NEGATIVE NEGATIVE Final   Influenza B by PCR NEGATIVE NEGATIVE Final    Comment: (NOTE) The Xpert Xpress SARS-CoV-2/FLU/RSV plus assay is intended as an aid in the diagnosis of influenza from Nasopharyngeal swab specimens and should not be used as a sole basis for treatment. Nasal washings and aspirates are unacceptable for Xpert Xpress SARS-CoV-2/FLU/RSV testing.  Fact Sheet for Patients: EntrepreneurPulse.com.au  Fact Sheet for Healthcare Providers: IncredibleEmployment.be  This test is not yet approved or cleared by the [place] FDA and has been authorized for detection and/or diagnosis of SARS-CoV-2 by FDA under an Emergency Use Authorization (EUA). This EUA will remain in effect (meaning this test can be used) for the duration of the COVID-19 declaration under Section 564(b)(1) of the Act, 21 U.S.C. section 360bbb-3(b)(1), unless the authorization is terminated or revoked.  Performed at [facility], [place] [address] [place], [place] [address]            Radiological Exams on Admission: Personally reviewed imaging which shows: Chest x-ray on admission showing low lung volumes with crowding lung markers but no consolidation or pleural effusion.  CT head showed no acute processes DG Chest 2 View  Result Date: 05/12/2021 CLINICAL DATA:  80 year old male positive for COVID-19 1 week ago. Disoriented. EXAM: CHEST - 2 VIEW COMPARISON:  Chest radiographs 07/09/2013 and earlier. FINDINGS: Low lung volumes and mild respiratory motion. Stable cardiac size and mediastinal contours. Borderline cardiomegaly. Crowding of lung markings bilaterally. No pleural effusion or consolidation. Visualized tracheal air column is within normal limits. No pneumothorax. No acute  osseous abnormality identified. Negative visible bowel gas.  IMPRESSION: Low lung volumes with crowding of lung markings, difficult to exclude a mild viral pneumonia. But no consolidation or pleural effusion. Electronically Signed   By: Genevie Ann M.D.   On: 05/12/2021 09:42   CT HEAD WO CONTRAST (5MM)  Result Date: 05/12/2021 CLINICAL DATA:  Delirium EXAM: CT HEAD WITHOUT CONTRAST TECHNIQUE: Contiguous axial images were obtained from the base of the skull through the vertex without intravenous contrast. COMPARISON:  11/08/2020 FINDINGS: Brain: There is no acute intracranial hemorrhage, mass effect, or edema. Gray-white differentiation is preserved. There is no extra-axial fluid collection. Ventricles and sulci are within normal limits in size and configuration. Minimal patchy low-density in the supratentorial white matter is nonspecific but may reflect minor chronic microvascular ischemic changes. Vascular: There is atherosclerotic calcification at the skull base. Skull: Calvarium is unremarkable. Sinuses/Orbits: Paranasal sinus mucosal thickening with air-fluid levels. Other: None. IMPRESSION: No acute intracranial abnormality. Electronically Signed   By: Macy Mis M.D.   On: 05/12/2021 11:03          Assessment/Plan   1.  Acute hypoxic respiratory failure -O2 saturation 89% on 2 L -Does not use oxygen at home but on CPAP at night -Likely in setting of COVID pneumonitis -Chest x-ray on admission showing low lung volumes with crowding lung markers but no consolidation or pleural effusion - COVID-positive in the ER in 10 days prior to admission -Blood gas ordered - Scheduled breathing treatments - Started on Decadron daily - Continue O2, wean as tolerated - CPAP at night   2.  AKI on CKD 3b -Possibly related to prerenal/poor intake with impaired glomerular hemodynamics at baseline as patient was on ARB and SGLT2.  However ischemic ATN and COVID on differential -On admission creatinine  3.02 -Baseline creatinine appears around 1.7 -Urinalysis pending - Added on UPC - Hold home HCTZ, ARB, SGLT2 - Renal ultrasound ordered -Strict I's and O's ordered -Follow-up labs ordered  3.  COVID -See further plans above  4.  Type 2 diabetes -Hemoglobin A1c 6.4 in October 2022 - Hold home SGLT2 - Glucose checks, sliding scale  5.  Hypothyroidism -Continue home Synthroid  6.  Metabolic acidosis -Likely related to AKI -On admission bicarb 16 - Blood gas ordered to analyze further - Follow-up labs ordered  7.  OSA -Continue home CPAP at night   DVT prophylaxis: Heparin subq Code Status: Full Family Communication: Patient only Disposition Plan: Anticipate discharge home when medically optimized Consults called: None Admission status: Inpatient telemetry      Medical decision making: Patient seen at 1:15 PM on 05/12/2021.  The patient was discussed with ER provider.  What exists of the patient's chart was reviewed in depth and summarized above.  Clinical condition: Fair.        Doran Heater [facility] Please page though [employer] or Epic secure chat:  For password, contact charge nurse

## 2021-05-12 NOTE — ED Notes (Signed)
Joseph Villa (Spouse) called asking for an update [phone]

## 2021-05-12 NOTE — ED Provider Notes (Signed)
Joseph Villa Provider Note   CSN: [id number] Arrival date & time: 05/12/21  0848     History Chief Complaint  Patient presents with   COVID +   AMS    [name] is a 80 y.o. male with past medical history significant for bipolar, hypertension, hyperlipidemia, CVA, RBBB presents for evaluation of altered mental status, shortness of breath.  Per EMS brought in from home.  Diagnosed with COVID 1 week ago.  Patient was confused this morning also had hypoxia into the mid 80s, wears CPAP at night.  Does not typically wear oxygen at home.  Had temp 102 prior to arrival, given Tylenol 500 mg at 0730. Persistent ongoing cough, anorexia. Has some SOB. Denies HA, abd pain, emesis, CP. Apparently was confused with wife at home when not knowing where his pants were however patient states he didn't have his hearing aids in and that is where he confusion came in because he couldn't hear her. Denies any pain.   Took out hearing aids PTA   Collateral from Wife Joseph Villa at [phone]: COVID dx 10 days ago.  Has had very little p.o. intake, drink more yesterday however days before that not really eating and drinking much.  Having productive cough.  No chest pain.  No emesis.  Very fatigued and weak.  She states she told EMS he was confused this morning however this was missed wording on her part she said that she was concerned that patient was so fatigued that he could not get up to get his pants and she checked his oxygen which was in the mid 80s.     HPI     Past Medical History:  Diagnosis Date   ALLERGIC ASTHMA 11/25/2007   ALLERGIC RHINITIS 11/25/2007   Allergy    Arthritis, lumbar spine 04/18/2011   BIPOLAR DISORDER UNSPECIFIED 10/06/2009   Blood transfusion without reported diagnosis    s/p goiter surgery age 61    COLONIC POLYPS, HX OF 10/06/2009   Degenerative arthritis of hip 04/18/2011   DJD (degenerative joint disease)    DM w/o  Complication Type II 82/95/6213   GERD 10/06/2009   Hemorrhage yrs ago   after goiter surgery, tracheostomy inserted and later removed   Hemorrhoid    History of kidney stones    HYPERLIPIDEMIA 10/06/2009   HYPERTENSION, BENIGN 07/27/2009   HYPOTHYROIDISM 10/06/2009   NEPHROLITHIASIS, HX OF 10/06/2009   OBSTRUCTIVE SLEEP APNEA 11/25/2007   cpap setting of 9   Peptic stricture of esophagus    RBBB (right bundle branch block)    Sleep apnea    wears c pap   Stroke [facility])     Patient Active Problem List   Diagnosis Date Noted   Acute respiratory failure with hypoxia (Joseph Villa) 05/12/2021   Left carotid artery stenosis 11/20/2020   Amaurosis fugax of left eye 11/08/2020   Hyperlipidemia associated with type 2 diabetes mellitus ([place]) 11/08/2020   Statin myopathy 05/22/2020   Eczema 11/13/2018   Rash 11/13/2018   Great toe pain, right 11/01/2017   Paronychia of toenail 10/18/2017   Chronic low back pain 05/02/2017   Chest pain 10/23/2016   CKD (chronic kidney disease), stage III (Joseph Villa) 04/19/2016   BPH (benign prostatic hypertrophy) with urinary obstruction 10/20/2015   Type 2 diabetes mellitus (Joseph Villa) 10/07/2015   Impingement syndrome of left shoulder 02/10/2015   Cough 07/09/2013   Positional vertigo 06/30/2012   Expected blood loss anemia 06/17/2012   Obese  06/17/2012   S/P left THA, AA 06/16/2012   Preop exam for internal medicine 06/04/2012   Peripheral neuropathy 12/21/2011   Anxiety 12/20/2011   Left hip pain 04/18/2011   Degenerative arthritis of hip 04/18/2011   Arthritis, lumbar spine 04/18/2011   Hemorrhoids, external 04/18/2011   Encounter for well adult exam with abnormal findings 12/23/2010   Hypothyroidism 10/06/2009   Hyperlipidemia 10/06/2009   BIPOLAR DISORDER UNSPECIFIED 10/06/2009   Hypertension associated with diabetes (Joseph Villa) 10/06/2009   GERD 10/06/2009   COLONIC POLYPS, HX OF 10/06/2009   NEPHROLITHIASIS, HX OF 10/06/2009   Obstructive sleep apnea  11/25/2007   Seasonal and perennial allergic rhinitis 11/25/2007   Allergic-infective asthma 11/25/2007    Past Surgical History:  Procedure Laterality Date   COLONOSCOPY     GANGLION CYST EXCISION     2-3 cysts removed   HERNIA REPAIR     JOINT REPLACEMENT     thyroid Goiter surgery     TONSILLECTOMY     TONSILLECTOMY     TOTAL HIP ARTHROPLASTY  06/16/2012   Procedure: TOTAL HIP ARTHROPLASTY ANTERIOR APPROACH;  Surgeon: Mauri Pole, MD;  Location: [facility] ORS;  Service: Orthopedics;  Laterality: Left;   TRANSCAROTID ARTERY REVASCULARIZATION  Left 11/20/2020   Procedure: LEFT TRANSCAROTID ARTERY REVASCULARIZATION;  Surgeon: Marty Heck, MD;  Location: [facility] OR;  Service: Vascular;  Laterality: Left;   UMBILICAL HERNIA REPAIR  several yrs ago   UPPER GASTROINTESTINAL ENDOSCOPY         Family History  Problem Relation Age of Onset   Throat cancer Mother    Alcohol abuse Brother    Diabetes Brother    Drug abuse Daughter    Alcoholism Other        uncle   Breast cancer Father    Colon cancer Neg Hx    Colon polyps Neg Hx    Esophageal cancer Neg Hx    Rectal cancer Neg Hx    Stomach cancer Neg Hx     Social History   Tobacco Use   Smoking status: Former    Packs/day: 1.50    Years: 15.00    Pack years: 22.50    Types: Cigarettes, Pipe    Quit date: 06/23/1977    Years since quitting: 43.9   Smokeless tobacco: Never  Vaping Use   Vaping Use: Never used  Substance Use Topics   Alcohol use: No    Alcohol/week: 0.0 standard drinks   Drug use: No    Home Medications Prior to Admission medications   Medication Sig Start Date End Date Taking? Authorizing Provider  sitaGLIPtin (JANUVIA) 50 MG tablet Take 1 tablet (50 mg total) by mouth daily. 04/10/21   Renato Shin, MD  Accu-Chek FastClix Lancets MISC USE TWICE DAILY 05/11/21   Renato Shin, MD  ACCU-CHEK GUIDE test strip CHECK BLOOD GLUCOSE (SUGAR) TWICE DAILY 03/14/21   Renato Shin, MD  aspirin 81 MG  chewable tablet Chew 1 tablet (81 mg total) by mouth daily. 11/11/20   Pokhrel, Corrie Mckusick, MD  bromocriptine (PARLODEL) 2.5 MG tablet Take 0.5 tablets (1.25 mg total) by mouth daily. 03/19/21   Renato Shin, MD  carbamazepine (TEGRETOL) 200 MG tablet TAKE 2 TABLETS AT BEDTIME 05/11/21   Biagio Borg, MD  Cholecalciferol (VITAMIN D3) 2000 UNITS TABS Take 2,000 Units by mouth every morning.     [provider]  clopidogrel (PLAVIX) 75 MG tablet Take 1 tablet (75 mg total) by mouth daily. 11/11/20   Pokhrel,  Corrie Mckusick, MD  dapagliflozin propanediol (FARXIGA) 5 MG TABS tablet Take 1 tablet (5 mg total) by mouth daily before breakfast. 03/19/21   Renato Shin, MD  desoximetasone (TOPICORT) 0.25 % cream Apply 1 application topically 2 (two) times daily. Patient taking differently: Apply 1 application topically 2 (two) times daily as needed (psoriasis). 11/13/18   Biagio Borg, MD  Evolocumab (REPATHA SURECLICK) 161 MG/ML SOAJ Inject 1 Dose into the skin every 14 (fourteen) days. 01/31/21   Hilty, Nadean Corwin, MD  ezetimibe (ZETIA) 10 MG tablet Take 1 tablet (10 mg total) by mouth daily. 11/11/20   Pokhrel, Corrie Mckusick, MD  fluticasone (FLONASE) 50 MCG/ACT nasal spray ONE SPRAY INTO NOSE EVERY DAY Patient taking differently: Place 2 sprays into both nostrils daily. 01/12/18   Biagio Borg, MD  glucose blood (ACCU-CHEK AVIVA PLUS) test strip Used to check blood sugars twice a day Dx E11.9 07/24/16   Biagio Borg, MD  glucose blood test strip Use to check blood sugar 2 times per day. DX Code: E11.9 07/12/15   Renato Shin, MD  hydrochlorothiazide (HYDRODIURIL) 25 MG tablet TAKE ONE TABLET DAILY 12/21/20   Biagio Borg, MD  Lancets Misc. (ACCU-CHEK FASTCLIX LANCET) KIT AS DIRECTED 03/05/16   Biagio Borg, MD  levothyroxine (SYNTHROID) 112 MCG tablet TAKE ONE TABLET EACH DAY 12/14/20   Biagio Borg, MD  Multiple Vitamins-Minerals (ICAPS AREDS FORMULA PO) Take 2 capsules by mouth daily.    [provider]   nateglinide (STARLIX) 60 MG tablet TAKE ONE-HALF TABLET 3 TIMES DAILY WITH MEALS 03/19/21   Renato Shin, MD  nirmatrelvir/ritonavir EUA, renal dosing, (PAXLOVID) 10 x 150 MG & 10 x 100MG TABS Take 2 tablets by mouth 2 (two) times daily for 5 days. (Take nirmatrelvir 150 mg one tablet twice daily for 5 days and ritonavir 100 mg one tablet twice daily for 5 days) Patient GFR is 42 05/11/21 05/16/21  Biagio Borg, MD  olmesartan [facility]) 40 MG tablet TAKE ONE TABLET DAILY 12/21/20   Biagio Borg, MD  Omega-3 Fatty Acids (FISH OIL) 1200 MG CAPS Take 1 capsule by mouth daily.    [provider]  oxyCODONE-acetaminophen (PERCOCET) 5-325 MG tablet Take 1 tablet by mouth every 6 (six) hours as needed for severe pain. 11/21/20 11/21/21  Karoline Caldwell, PA-C  pantoprazole (PROTONIX) 40 MG tablet TAKE ONE TABLET DAILY 04/09/21   Biagio Borg, MD  tamsulosin (FLOMAX) 0.4 MG CAPS capsule TAKE ONE CAPSULE EACH DAY Patient taking differently: Take 0.4 mg by mouth daily. TAKE ONE CAPSULE EACH DAY 06/30/20   Biagio Borg, MD  TURMERIC CURCUMIN PO Take 500 mg by mouth daily.    [provider]  vitamin B-12 (CYANOCOBALAMIN) 1000 MCG tablet Take 1,000 mcg by mouth daily.    [provider]    Allergies    Cefuroxime axetil, Cefprozil, Cephalosporins, Crestor [rosuvastatin calcium], Lipitor [atorvastatin], Metformin and related, and Rabeprazole sodium  Review of Systems   Review of Systems  Constitutional:  Positive for chills, fatigue and fever.  Respiratory:  Positive for cough and shortness of breath. Negative for apnea, choking, chest tightness, wheezing and stridor.   Cardiovascular: Negative.   Gastrointestinal: Negative.   Genitourinary: Negative.   Musculoskeletal: Negative.   Skin: Negative.   Neurological:  Positive for weakness.  All other systems reviewed and are negative.  Physical Exam Updated Vital Signs BP 134/61   Pulse 99   Temp 99.9 F (37.7 C) (Oral)  Resp (!) 23   Ht 5' 11.5" (1.816 m)   Wt 113.4 kg   SpO2 96%   BMI 34.38 kg/m   Physical Exam Vitals and nursing note reviewed.  Constitutional:      General: He is not in acute distress.    Appearance: He is well-developed. He is not ill-appearing, toxic-appearing or diaphoretic.  HENT:     Head: Normocephalic and atraumatic.     Nose: Nose normal.     Mouth/Throat:     Lips: Pink.     Mouth: Mucous membranes are dry.     Pharynx: Oropharynx is clear.     Comments: Mucous membranes dry Eyes:     Pupils: Pupils are equal, round, and reactive to light.  Neck:     Trachea: Trachea and phonation normal.     Comments: Full range of motion Cardiovascular:     Rate and Rhythm: Normal rate and regular rhythm.     Pulses: Normal pulses.          Radial pulses are 2+ on the right side and 2+ on the left side.       Dorsalis pedis pulses are 2+ on the right side and 2+ on the left side.     Heart sounds: Normal heart sounds.  Pulmonary:     Effort: Pulmonary effort is normal. No respiratory distress.     Breath sounds: Normal breath sounds and air entry.     Comments: Clear, speaks in full sentences without difficulty Chest:     Comments: Nontender, no crepitus Abdominal:     General: Bowel sounds are normal. There is no distension.     Palpations: Abdomen is soft.     Tenderness: There is no abdominal tenderness. There is no right CVA tenderness, left CVA tenderness, guarding or rebound.     Comments: Soft, nontender  Musculoskeletal:        General: No swelling, tenderness, deformity or signs of injury. Normal range of motion.     Cervical back: Full passive range of motion without pain, normal range of motion and neck supple.     Right lower leg: No edema.     Left lower leg: No edema.     Comments: No bony tenderness, compartments soft.  Bevelyn Buckles' sign negative.  No lower extremity edema  Skin:    General: Skin is warm and dry.     Capillary Refill: Capillary refill takes  less than 2 seconds.     Comments: No obvious rash or lesion  Neurological:     General: No focal deficit present.     Mental Status: He is alert and oriented to person, place, and time.     Cranial Nerves: Cranial nerves 2-12 are intact.     Sensory: Sensation is intact.     Motor: Motor function is intact.     Coordination: Coordination is intact.     Gait: Gait is intact.     Comments: A and O x4 Equal strength Intact sensation     ED Results / Procedures / Treatments   Labs (all labs ordered are listed, but only abnormal results are displayed) Labs Reviewed  RESP PANEL BY RT-PCR (FLU A&B, COVID) ARPGX2 - Abnormal; Notable for the following components:      Result Value   SARS Coronavirus 2 by RT PCR POSITIVE (*)    All other components within normal limits  BASIC METABOLIC PANEL - Abnormal; Notable for the following components:   Sodium 133 (*)  CO2 16 (*)    Glucose, Bld 230 (*)    BUN 65 (*)    Creatinine, Ser 3.02 (*)    Calcium 8.4 (*)    GFR, Estimated 20 (*)    All other components within normal limits  CBC WITH DIFFERENTIAL/PLATELET - Abnormal; Notable for the following components:   WBC 15.7 (*)    RBC 3.61 (*)    Hemoglobin 10.8 (*)    HCT 33.7 (*)    Neutro Abs 12.8 (*)    Eosinophils Absolute 0.7 (*)    Abs Immature Granulocytes 0.11 (*)    All other components within normal limits  TROPONIN I (HIGH SENSITIVITY) - Abnormal; Notable for the following components:   Troponin I (High Sensitivity) 32 (*)    All other components within normal limits  TROPONIN I (HIGH SENSITIVITY) - Abnormal; Notable for the following components:   Troponin I (High Sensitivity) 32 (*)    All other components within normal limits  URINALYSIS, ROUTINE W REFLEX MICROSCOPIC    EKG EKG Interpretation  Date/Time:  Saturday May 12 2021 10:56:59 EST Ventricular Rate:  86 PR Interval:  214 QRS Duration: 151 QT Interval:  379 QTC Calculation: 454 R Axis:   55 Text  Interpretation: Sinus rhythm Borderline prolonged PR interval Right bundle branch block No significant change since last tracing Confirmed by Deno Etienne [phone]) on 05/12/2021 11:00:38 AM  Radiology DG Chest 2 View  Result Date: 05/12/2021 CLINICAL DATA:  80 year old male positive for COVID-19 1 week ago. Disoriented. EXAM: CHEST - 2 VIEW COMPARISON:  Chest radiographs 07/09/2013 and earlier. FINDINGS: Low lung volumes and mild respiratory motion. Stable cardiac size and mediastinal contours. Borderline cardiomegaly. Crowding of lung markings bilaterally. No pleural effusion or consolidation. Visualized tracheal air column is within normal limits. No pneumothorax. No acute osseous abnormality identified. Negative visible bowel gas. IMPRESSION: Low lung volumes with crowding of lung markings, difficult to exclude a mild viral pneumonia. But no consolidation or pleural effusion. Electronically Signed   By: Genevie Ann M.D.   On: 05/12/2021 09:42   CT HEAD WO CONTRAST (5MM)  Result Date: 05/12/2021 CLINICAL DATA:  [place] EXAM: CT HEAD WITHOUT CONTRAST TECHNIQUE: Contiguous axial images were obtained from the base of the skull through the vertex without intravenous contrast. COMPARISON:  11/08/2020 FINDINGS: Brain: There is no acute intracranial hemorrhage, mass effect, or edema. Gray-white differentiation is preserved. There is no extra-axial fluid collection. Ventricles and sulci are within normal limits in size and configuration. Minimal patchy low-density in the supratentorial white matter is nonspecific but may reflect minor chronic microvascular ischemic changes. Vascular: There is atherosclerotic calcification at the skull base. Skull: Calvarium is unremarkable. Sinuses/Orbits: Paranasal sinus mucosal thickening with air-fluid levels. Other: None. IMPRESSION: No acute intracranial abnormality. Electronically Signed   By: Macy Mis M.D.   On: 05/12/2021 11:03    Procedures .Critical Care Performed  by: Nettie Elm, PA-C Authorized by: Nettie Elm, PA-C   Critical care provider statement:    Critical care time (minutes):  32   Critical care time was exclusive of:  Separately billable procedures and treating other patients and teaching time   Critical care was necessary to treat or prevent imminent or life-threatening deterioration of the following conditions:  Respiratory failure   Critical care was time spent personally by me on the following activities:  Blood draw for specimens, development of treatment plan with patient or surrogate, discussions with consultants, discussions with primary provider, evaluation of patient's  response to treatment, examination of patient, obtaining history from patient or surrogate, review of old charts, re-evaluation of patient's condition, pulse oximetry and ordering and review of radiographic studies   Care discussed with: admitting provider     Medications Ordered in ED Medications  acetaminophen (TYLENOL) tablet 500 mg (500 mg Oral Given 05/12/21 1105)  sodium chloride 0.9 % bolus 1,000 mL (1,000 mLs Intravenous New Bag/Given 05/12/21 1301)    ED Course  I have reviewed the triage vital signs and the nursing notes.  Pertinent labs & imaging results that were available during my care of the patient were reviewed by me and considered in my medical decision making (see chart for details).  79 year old here for evaluation of hypoxia, known COVID infection.  He is afebrile, nonseptic appearing.  Requiring 2 L via nasal cannula.  Nurses were able to titrate him off room air however while sitting however oxygen down to high 80s with ambulation.  He does come up to the low, mid 90s without any supplemental oxygen however then becomes tachypnea into the 30s.  Possible earlier episode of altered mental status as EMS describes however wife states he was not necessarily confused however just very weak and could not get up to get his pants.  Hypoxic at  home.  Does not appear clinically fluid overloaded, no history of PE, DVT, no clinical evidence of VTE on exam. Cannot perform CTA to r/o PE due to AKI. Will defer VQ imaging however higher clinical suspicion of hypoxia due to infection.  Labs and imaging personally reviewed and interpreted:  CBC leukocytosis at 78.7 Metabolic panel glucose 183, creatinine 3.02 up from baseline 1.6 COVID-positive Troponin flat at 32, denies any chest pain CT head without significant abnormality EKG without ischemic changes Chest x-ray low lung volumes, difficult to exclude viral pneumonia  Patient reassessed.  Now requiring 2 L via nasal cannula.  Like this is due to COVID infection.  Low suspicion for acute ACS, PE, dissection, CHF.  Does have AKI which I suspect also is due to decreased p.o. intake from his infection.  Was given fluid bolus.  Will admit for further management of acute hypoxic respiratory failure.  Updated family on plan. Agreeable for admission.  The patient appears reasonably stabilized for admission considering the current resources, flow, and capabilities available in the ED at this time, and I doubt any other [facility] requiring further screening and/or treatment in the ED prior to admission.   CONSULT with Dr. Luna Fuse with [facility] who agrees to patient for admission    MDM Rules/Calculators/A&P                            Final Clinical Impression(s) / ED Diagnoses Final diagnoses:  COVID  AKI (acute kidney injury) ([place])  Acute respiratory failure with hypoxia [facility])    Rx / DC Orders ED Discharge Orders     None        Myana Schlup A, PA-C 05/12/21 [place], DO 05/12/21 1450

## 2021-05-12 NOTE — ED Triage Notes (Signed)
Pt to triage via [facility] from home.  COVID + 1 week ago.  Wife reported pt was disoriented at home concerning where his pants were to put on this morning.  Pt alert and oriented x 4.  Wife reports sats mid 54s at home this morning.  Doesn't wear home oxygen.  Reports temp 102 at home.  Took Tylenol 500mg  @ 0730.    EMS- IV-20g L wrist NS 300cc CBG 243 Sats 89-90% on room air for EMS.  EMS placed pt on 4 liters [place]- sats up to 94%.

## 2021-05-12 NOTE — ED Notes (Signed)
This RN assisted pt from recliner back into bed.

## 2021-05-12 NOTE — ED Notes (Signed)
Respiratory therapist at pt bedside for bedtime CPAP.

## 2021-05-12 NOTE — ED Notes (Signed)
This RN provided pt with toothbrush, toothpaste, spit basin, and water to rinse.

## 2021-05-12 NOTE — ED Notes (Signed)
OS vitals performed and  Spo2 reached 89% while ambulating

## 2021-05-12 NOTE — ED Notes (Signed)
This RN spoke with pt's wife on the phone for update on pt.

## 2021-05-12 NOTE — ED Notes (Signed)
Wife Riker Collier [phone] would like an update

## 2021-05-12 NOTE — ED Provider Notes (Signed)
Emergency Medicine Provider Triage Evaluation Note  Joseph Villa , a 80 y.o. male  was evaluated in triage.  Pt complains of shortness of breath.  Patient brought in by EMS from home.  Diagnosed with COVID 1 week ago.  Per wife patient was disoriented this morning.  Wife also reports O2 sat in the mid 80s this morning.  No home O2.  She also reports temp of 102.  She gave him 500 of Tylenol at 07 30 this morning.  Patient endorses shortness of breath.  He also reports ongoing cough, anorexia.  He denies abdominal pain, nausea, vomiting or diarrhea, chest pain, palpitations.  He does state that he has taken out his hearing aids because they are bothering his ears and may be contributing to his wife feeling like he is disoriented.  Alert and oriented on my exam.  Review of Systems  Positive: See above Negative:   Physical Exam  BP (!) 126/51 (BP Location: Left Arm)   Pulse 91   Temp 99.2 F (37.3 C) (Oral)   Resp 20   Ht 5' 11.5" (1.816 m)   Wt 113.4 kg   SpO2 93%   BMI 34.38 kg/m  Gen:   Awake, alert and oriented x3 Resp:  Tachypneic, left lower lobe with rhonchi, right middle and lower lobes with crackles MSK:   Moves extremities without difficulty  Other:  S1/S2 without murmur.  Medical Decision Making  Medically screening exam initiated at 9:04 AM.  Appropriate orders placed.  [name] was informed that the remainder of the evaluation will be completed by another provider, this initial triage assessment does not replace that evaluation, and the importance of remaining in the ED until their evaluation is complete.  Rule out COVID-pneumonia, new O2 requirement, needs expedited room.   Mickie Hillier, PA-C 05/12/21 [place], [place], DO 05/12/21 [phone]

## 2021-05-13 ENCOUNTER — Inpatient Hospital Stay (HOSPITAL_COMMUNITY): Payer: Medicare Other

## 2021-05-13 LAB — HEPATIC FUNCTION PANEL
ALT: 38 U/L (ref 0–44)
AST: 24 U/L (ref 15–41)
Albumin: 2.5 g/dL — ABNORMAL LOW (ref 3.5–5.0)
Alkaline Phosphatase: 60 U/L (ref 38–126)
Bilirubin, Direct: <0.1 mg/dL (ref 0.0–0.2)
Total Bilirubin: 0.4 mg/dL (ref 0.3–1.2)
Total Protein: 6.5 g/dL (ref 6.5–8.1)

## 2021-05-13 LAB — BASIC METABOLIC PANEL
Anion gap: 9 (ref 5–15)
BUN: 60 mg/dL — ABNORMAL HIGH (ref 8–23)
CO2: 18 mmol/L — ABNORMAL LOW (ref 22–32)
Calcium: 8.1 mg/dL — ABNORMAL LOW (ref 8.9–10.3)
Chloride: 104 mmol/L (ref 98–111)
Creatinine, Ser: 2.81 mg/dL — ABNORMAL HIGH (ref 0.61–1.24)
GFR, Estimated: 22 mL/min — ABNORMAL LOW (ref >60)
Glucose, Bld: 267 mg/dL — ABNORMAL HIGH (ref 70–99)
Potassium: 4.6 mmol/L (ref 3.5–5.1)
Sodium: 131 mmol/L — ABNORMAL LOW (ref 135–145)

## 2021-05-13 LAB — CBC WITH DIFFERENTIAL/PLATELET
Abs Immature Granulocytes: 0.16 10*3/uL — ABNORMAL HIGH (ref 0.00–0.07)
Basophils Absolute: 0.1 10*3/uL (ref 0.0–0.1)
Basophils Relative: 0 %
Eosinophils Absolute: 0.1 10*3/uL (ref 0.0–0.5)
Eosinophils Relative: 0 %
HCT: 30.7 % — ABNORMAL LOW (ref 39.0–52.0)
Hemoglobin: 10.1 g/dL — ABNORMAL LOW (ref 13.0–17.0)
Immature Granulocytes: 1 %
Lymphocytes Relative: 5 %
Lymphs Abs: 0.7 10*3/uL (ref 0.7–4.0)
MCH: 30.1 pg (ref 26.0–34.0)
MCHC: 32.9 g/dL (ref 30.0–36.0)
MCV: 91.6 fL (ref 80.0–100.0)
Monocytes Absolute: 0.8 10*3/uL (ref 0.1–1.0)
Monocytes Relative: 6 %
Neutro Abs: 11.9 10*3/uL — ABNORMAL HIGH (ref 1.7–7.7)
Neutrophils Relative %: 88 %
Platelets: 221 10*3/uL (ref 150–400)
RBC: 3.35 MIL/uL — ABNORMAL LOW (ref 4.22–5.81)
RDW: 13.3 % (ref 11.5–15.5)
WBC: 13.7 10*3/uL — ABNORMAL HIGH (ref 4.0–10.5)
nRBC: 0 % (ref 0.0–0.2)

## 2021-05-13 LAB — BLOOD GAS, VENOUS
Acid-base deficit: 6.3 mmol/L — ABNORMAL HIGH (ref 0.0–2.0)
Bicarbonate: 18.3 mmol/L — ABNORMAL LOW (ref 20.0–28.0)
Drawn by: 1444
O2 Saturation: 96.2 %
Patient temperature: 37
pCO2, Ven: 34.5 mmHg — ABNORMAL LOW (ref 44.0–60.0)
pH, Ven: 7.344 (ref 7.250–7.430)
pO2, Ven: 85.5 mmHg — ABNORMAL HIGH (ref 32.0–45.0)

## 2021-05-13 LAB — PHOSPHORUS
Phosphorus: 2.2 mg/dL — ABNORMAL LOW (ref 2.5–4.6)
Phosphorus: 2.2 mg/dL — ABNORMAL LOW (ref 2.5–4.6)

## 2021-05-13 LAB — COMPREHENSIVE METABOLIC PANEL
ALT: 42 U/L (ref 0–44)
AST: 24 U/L (ref 15–41)
Albumin: 2.6 g/dL — ABNORMAL LOW (ref 3.5–5.0)
Alkaline Phosphatase: 61 U/L (ref 38–126)
Anion gap: 10 (ref 5–15)
BUN: 60 mg/dL — ABNORMAL HIGH (ref 8–23)
CO2: 18 mmol/L — ABNORMAL LOW (ref 22–32)
Calcium: 8.1 mg/dL — ABNORMAL LOW (ref 8.9–10.3)
Chloride: 104 mmol/L (ref 98–111)
Creatinine, Ser: 2.79 mg/dL — ABNORMAL HIGH (ref 0.61–1.24)
GFR, Estimated: 22 mL/min — ABNORMAL LOW (ref >60)
Glucose, Bld: 267 mg/dL — ABNORMAL HIGH (ref 70–99)
Potassium: 4.6 mmol/L (ref 3.5–5.1)
Sodium: 132 mmol/L — ABNORMAL LOW (ref 135–145)
Total Bilirubin: 0.5 mg/dL (ref 0.3–1.2)
Total Protein: 6.5 g/dL (ref 6.5–8.1)

## 2021-05-13 LAB — PROCALCITONIN: Procalcitonin: 2.37 ng/mL

## 2021-05-13 LAB — MAGNESIUM
Magnesium: 2.2 mg/dL (ref 1.7–2.4)
Magnesium: 2.2 mg/dL (ref 1.7–2.4)

## 2021-05-13 LAB — CBG MONITORING, ED: Glucose-Capillary: 300 mg/dL — ABNORMAL HIGH (ref 70–99)

## 2021-05-13 LAB — GLUCOSE, CAPILLARY
Glucose-Capillary: 192 mg/dL — ABNORMAL HIGH (ref 70–99)
Glucose-Capillary: 302 mg/dL — ABNORMAL HIGH (ref 70–99)
Glucose-Capillary: 329 mg/dL — ABNORMAL HIGH (ref 70–99)
Glucose-Capillary: 239 mg/dL — ABNORMAL HIGH (ref 70–99)

## 2021-05-13 LAB — C-REACTIVE PROTEIN: CRP: 43.7 mg/dL — ABNORMAL HIGH (ref <1.0)

## 2021-05-13 LAB — FERRITIN: Ferritin: 1136 ng/mL — ABNORMAL HIGH (ref 24–336)

## 2021-05-13 LAB — D-DIMER, QUANTITATIVE: D-Dimer, Quant: 3.83 ug/mL-FEU — ABNORMAL HIGH (ref 0.00–0.50)

## 2021-05-13 MED ORDER — LEVOFLOXACIN 250 MG PO TABS
250.0000 mg | ORAL_TABLET | Freq: Every day | ORAL | Status: AC
  Administered 2021-05-14 – 2021-05-15 (×2): 250 mg via ORAL
  Filled 2021-05-13 (×2): qty 1

## 2021-05-13 MED ORDER — INSULIN ASPART 100 UNIT/ML IJ SOLN
4.0000 [IU] | Freq: Three times a day (TID) | INTRAMUSCULAR | Status: DC
  Administered 2021-05-13 (×2): 4 [IU] via SUBCUTANEOUS

## 2021-05-13 MED ORDER — INSULIN GLARGINE-YFGN 100 UNIT/ML ~~LOC~~ SOLN
15.0000 [IU] | Freq: Every day | SUBCUTANEOUS | Status: DC
  Administered 2021-05-13: 15 [IU] via SUBCUTANEOUS
  Filled 2021-05-13 (×2): qty 0.15

## 2021-05-13 MED ORDER — AZITHROMYCIN 500 MG PO TABS: 500.0000 mg | ORAL_TABLET | Freq: Every day | ORAL | Status: DC

## 2021-05-13 MED ORDER — INSULIN ASPART 100 UNIT/ML IJ SOLN
0.0000 [IU] | Freq: Three times a day (TID) | INTRAMUSCULAR | Status: DC
  Administered 2021-05-13 (×2): 15 [IU] via SUBCUTANEOUS
  Administered 2021-05-14: 11 [IU] via SUBCUTANEOUS
  Administered 2021-05-14: 4 [IU] via SUBCUTANEOUS
  Administered 2021-05-14: 7 [IU] via SUBCUTANEOUS
  Administered 2021-05-15: 4 [IU] via SUBCUTANEOUS
  Administered 2021-05-15: 7 [IU] via SUBCUTANEOUS
  Administered 2021-05-15 – 2021-05-16 (×2): 4 [IU] via SUBCUTANEOUS
  Administered 2021-05-16: 14:00:00 7 [IU] via SUBCUTANEOUS
  Administered 2021-05-16: 11:00:00 3 [IU] via SUBCUTANEOUS
  Administered 2021-05-17: 4 [IU] via SUBCUTANEOUS
  Administered 2021-05-17 (×2): 3 [IU] via SUBCUTANEOUS
  Administered 2021-05-18: 7 [IU] via SUBCUTANEOUS
  Administered 2021-05-18: 4 [IU] via SUBCUTANEOUS

## 2021-05-13 MED ORDER — METHYLPREDNISOLONE SODIUM SUCC 125 MG IJ SOLR
60.0000 mg | Freq: Two times a day (BID) | INTRAMUSCULAR | Status: DC
  Administered 2021-05-13 – 2021-05-17 (×10): 60 mg via INTRAVENOUS
  Filled 2021-05-13 (×10): qty 2

## 2021-05-13 MED ORDER — LEVOFLOXACIN 500 MG PO TABS
500.0000 mg | ORAL_TABLET | Freq: Every day | ORAL | Status: AC
  Administered 2021-05-13: 500 mg via ORAL
  Filled 2021-05-13: qty 1

## 2021-05-13 NOTE — Progress Notes (Signed)
Patient temp currently 99.9 after Tylenol. Will continue to monitor. MEWS Green.  05/13/21 0537  Assess: MEWS Score  Temp 99.9 F (37.7 C)  Level of Consciousness Alert  Assess: MEWS Score  MEWS Temp 0  MEWS Systolic 0  MEWS Pulse 0  MEWS RR 1  MEWS LOC 0  MEWS Score 1  MEWS Score Color Green  Assess: if the MEWS score is Yellow or Red  Were vital signs taken at a resting state? Yes  Focused Assessment Change from prior assessment (see assessment flowsheet)  Early Detection of Sepsis Score *See Row Information* Low  Document  Patient Outcome Stabilized after interventions

## 2021-05-13 NOTE — Care Management (Signed)
  Transition of Care [facility]) Screening Note   Patient Details  Name: Joseph Villa Date of Birth: 08-31-1940   Transition of Care [facility]) CM/SW Contact:    Carles Collet, RN Phone Number: 05/13/2021, 12:58 PM    Transition of Care Department [facility]) has reviewed patient and no TOC needs have been identified at this time. We will continue to monitor patient advancement through interdisciplinary progression rounds. If new patient transition needs arise, please place a TOC consult.

## 2021-05-13 NOTE — Evaluation (Signed)
Occupational Therapy Evaluation Patient Details Name: Joseph Villa MRN: [id number] DOB: 04-01-1941 Today's Date: 05/13/2021   History of Present Illness Joseph Villa is a 80 y.o. male who presented to the ER on 05/12/2021 with with dyspnea, hypoxia, fever. Tested positive for COVID ~1 week PTA but became more weak and fatigued.   PMH of GERD, hypothyroidism, hypertension, OSA, diabetes, CKD   Clinical Impression   Joseph Villa reports being indep PTA including IADLs. He lives in a 1 level home, 3-4 STE with his wife and beloved pet bird. Upon evaluation pt was limited by poor activity tolerance and limited insight into safety. Overall he required min guard for transfers and mobility with mild unsteadiness observed, without AD. Pt on 2L nasal canula with SpO2 at 91-92% for the entire session. He also requires up to min A for ADLs. Pt will benefit from OT acutely to progress towards his indep baseline. Recommend d/c home with supervision initially for ADLs and mobility.    Recommendations for follow up therapy are one component of a multi-disciplinary discharge planning process, led by the attending physician.  Recommendations may be updated based on patient status, additional functional criteria and insurance authorization.   Follow Up Recommendations  No OT follow up    Assistance Recommended at Discharge Intermittent Supervision/Assistance  Functional Status Assessment  Patient has had a recent decline in their functional status and demonstrates the ability to make significant improvements in function in a reasonable and predictable amount of time.  Equipment Recommendations  None recommended by OT       Precautions / Restrictions Precautions Precautions: Fall Precaution Comments: watch O2 Restrictions Weight Bearing Restrictions: No      Mobility Bed Mobility Overal bed mobility: Independent                  Transfers Overall transfer level: Needs  assistance Equipment used: None Transfers: Sit to/from Stand Sit to Stand: Min guard           General transfer comment: min guard for safety      Balance Overall balance assessment: Needs assistance Sitting-balance support: No upper extremity supported;Feet unsupported Sitting balance-Leahy Scale: Good     Standing balance support: No upper extremity supported;During functional activity Standing balance-Leahy Scale: Fair                             ADL either performed or assessed with clinical judgement   ADL Overall ADL's : Needs assistance/impaired Eating/Feeding: Independent;Sitting Eating/Feeding Details (indicate cue type and reason): eating upon arrival Grooming: Set up;Sitting Grooming Details (indicate cue type and reason): or close min guard standing at sink Upper Body Bathing: Set up;Sitting   Lower Body Bathing: Moderate assistance;Sit to/from stand   Upper Body Dressing : Set up;Sitting   Lower Body Dressing: Moderate assistance;Sit to/from stand   Toilet Transfer: Min guard;Ambulation   Toileting- Clothing Manipulation and Hygiene: Min guard;Sitting/lateral lean       Functional mobility during ADLs: Min guard General ADL Comments: close min guard for all OOB tasks, pt slightly unsteady on his feet. poor attention to task and limited insight into safety     Vision Baseline Vision/History: 0 No visual deficits Ability to See in Adequate Light: 0 Adequate Vision Assessment?: No apparent visual deficits     Perception     Praxis      Pertinent Vitals/Pain Pain Assessment: No/denies pain     Hand Dominance Right  Extremity/Trunk Assessment Upper Extremity Assessment Upper Extremity Assessment: RUE deficits/detail;LUE deficits/detail RUE Deficits / Details: limited over head ROM due to old rotator cuff injury, elbow, wrist and hand ROM is [facility] & 4/5 MMT RUE: Shoulder pain with ROM RUE Sensation: WNL RUE Coordination: WNL LUE  Deficits / Details: limited over head ROM due to old shoulder injury however has more range on the L>R. shoulder is 3/5 MMT. elbow wrist and hand are WFL LUE Sensation: WNL LUE Coordination: WNL   Lower Extremity Assessment Lower Extremity Assessment: Defer to PT evaluation   Cervical / Trunk Assessment Cervical / Trunk Assessment: Normal   Communication Communication Communication: HOH   Cognition Arousal/Alertness: Awake/alert Behavior During Therapy: WFL for tasks assessed/performed Overall Cognitive Status: Within Functional Limits for tasks assessed                                 General Comments: pt often changing subject throughout the session, always reverting conversation to his pet brid. Had to be re-directed to question/task several times.     General Comments  pt at 91% SpO2 on 2L [place]    Exercises     Shoulder Instructions      Home Living Family/patient expects to be discharged to:: Private residence Living Arrangements: Spouse/significant other Available Help at Discharge: Family;Available 24 hours/day Type of Home: House Home Access: Stairs to enter [employer] of Steps: 4 Entrance Stairs-Rails: Right;Left Home Layout: One level     Bathroom Shower/Tub: Occupational psychologist: Handicapped height Bathroom Accessibility: Yes   Home Equipment: None          Prior Functioning/Environment Prior Level of Function : Independent/Modified Independent;Driving                        OT Problem List: Decreased strength;Decreased range of motion;Decreased activity tolerance;Impaired balance (sitting and/or standing);Pain;Decreased safety awareness;Decreased knowledge of precautions      OT Treatment/Interventions: Self-care/ADL training;Therapeutic exercise;Therapeutic activities;Patient/family education;Balance training    OT Goals(Current goals can be found in the care plan section) Acute Rehab OT  Goals Patient Stated Goal: home soon OT Goal Formulation: With patient Time For Goal Achievement: 05/27/21 Potential to Achieve Goals: Good ADL Goals Pt Will Perform Grooming: Independently;standing Pt Will Perform Lower Body Bathing: with modified independence;sit to/from stand Pt Will Perform Lower Body Dressing: with modified independence;sit to/from stand Pt Will Transfer to Toilet: Independently;ambulating  OT Frequency: Min 2X/week   Barriers to D/C:            Co-evaluation              AM-PAC OT "6 Clicks" Daily Activity     Outcome Measure Help from another person eating meals?: None Help from another person taking care of personal grooming?: A Little Help from another person toileting, which includes using toliet, bedpan, or urinal?: A Little Help from another person bathing (including washing, rinsing, drying)?: A Little Help from another person to put on and taking off regular upper body clothing?: None Help from another person to put on and taking off regular lower body clothing?: A Little 6 Click Score: 20   End of Session Equipment Utilized During Treatment: Gait belt;Oxygen Nurse Communication: Mobility status (sitting EOB eating food; mod I for bed mobility)  Activity Tolerance: Patient tolerated treatment well Patient left: in bed;with bed alarm set;with call bell/phone within reach  OT Visit Diagnosis:  Unsteadiness on feet (R26.81);Other abnormalities of gait and mobility (R26.89);Muscle weakness (generalized) (M62.81);Pain Pain - part of body: Shoulder                Time: 1610-1630 OT Time Calculation (min): 20 min Charges:  OT General Charges $OT Visit: 1 Visit OT Evaluation $OT Eval Moderate Complexity: 1 Mod    Gaberial Cada A Faelyn Sigler 05/13/2021, 4:41 PM

## 2021-05-13 NOTE — Progress Notes (Signed)
PROGRESS NOTE        PATIENT DETAILS Name: Joseph Villa Age: 80 y.o. Sex: male Date of Birth: 1941/02/14 Admit Date: 05/12/2021 Admitting Physician Delfino Lovett Cyndra Numbers, DO KWI:OXBD, Hunt Oris, MD  Brief Narrative: Patient is a 80 y.o. male with history of GERD, HTN, hypothyroidism, DM-2, CKD stage IIIb-who has been having cough-for approximately around 8 days prior to this hospitalization-subsequently diagnosed with COVID-19 around 4-5 days prior to this hospitalization-presenting with worsening cough/shortness of breath and fever-found to have acute hypoxic respiratory failure due to COVID-19 pneumonia.  See below for further details.    Subjective: Lying comfortably in bed-denies any chest pain or shortness of breath.  Objective: Vitals: Blood pressure (!) 167/72, pulse 86, temperature 99.9 F (37.7 C), temperature source Oral, resp. rate (!) 21, height 5' 11.5" (1.816 m), weight 113.4 kg, SpO2 92 %.   Exam: Gen Exam:Alert awake-not in any distress HEENT:atraumatic, normocephalic Chest: B/L clear to auscultation anteriorly CVS:S1S2 regular Abdomen:soft non tender, non distended Extremities:no edema Neurology: Non focal Skin: no rash  Pertinent Labs/Radiology: Recent Labs  Lab 05/13/21 0425  WBC 13.7*  HGB 10.1*  PLT 221  NA 132*  131*  K 4.6  4.6  CREATININE 2.79*  2.81*  AST 24  24  ALT 38  42  ALKPHOS 60  61  BILITOT 0.4  0.5   Assessment/Plan: Acute hypoxic respiratory failure due to COVID-19 pneumonitis: Mild hypoxia-but significantly elevated CRP-we will switch from Decadron to Solu-Medrol.  Since he has symptoms for close to 8 days-doubt any role for remdesivir at this point.   Given mild leukocytosis and elevated procalcitonin level-reasonable to cover for superimposed bacterial infection-hence we will plan on empiric IV antibiotics for a few days-follow procalcitonin trend.  Watch closely-follow clinical course.  Recent Labs     05/13/21 0425 05/13/21 0732  DDIMER  --  3.83*  FERRITIN 1,136*  --   CRP 43.7*  --    AKI on CKD stage IIIb: AKI likely hemodynamically mediated-improving with supportive care.  Renal ultrasound without any hydronephrosis.  UA with very minimal proteinuria.  Avoid nephrotoxic agents-and follow renal function.  Lab Results  Component Value Date   CREATININE 2.81 (H) 05/13/2021   CREATININE 2.79 (H) 05/13/2021   CREATININE 3.02 (H) 05/12/2021     Elevated D-dimer: Likely due to COVID-19 infection-has mild hypoxemia-check lower extremity Doppler.  Follow D-dimer trend.  On prophylactic heparin.  History of CVA-bilateral carotid artery stenosis-s/p left TCAR 2022: Continue aspirin/Plavix-nonfocal exam.  DM-2 (A1c 6.4 on 10/22) with uncontrolled hyperglycemia due to steroids: We will start Semglee, Premeal NovoLog-changed to resistant SSI-and watch CBGs very closely.  Reassess on 12/12.  Continue to hold all oral hypoglycemic agents  Recent Labs    05/12/21 1633 05/13/21 0001 05/13/21 0823  GLUCAP 169* 300* 192*    Hypothyroidism: Continue with levothyroxine  ZHG:DJMEQAST Flomax  Obesity Estimated body mass index is 34.38 kg/m as calculated from the following:   Height as of this encounter: 5' 11.5" (1.816 m).   Weight as of this encounter: 113.4 kg.   Procedures: None Consults: None DVT Prophylaxis: Heparin Code Status:Full code Family Communication: Spouse-Carol-[phone] updated over the phone.  Time spent: 35 minutes-Greater than 50% of this time was spent in counseling, explanation of diagnosis, planning of further management, and coordination of care.   Disposition Plan: Status is:  Inpatient  Remains inpatient appropriate because: Hypoxia due to likely COVID-19 pneumonia/superimposed bacterial pneumonia.    Diet: Diet Order             Diet Carb Modified Fluid consistency: Thin; Room service appropriate? Yes  Diet effective now                      Antimicrobial agents: Anti-infectives (From admission, onward)    None        MEDICATIONS: Scheduled Meds:  albuterol  2 puff Inhalation Q6H   aspirin  81 mg Oral Daily   bromocriptine  1.25 mg Oral Daily   carbamazepine  400 mg Oral QHS   cholecalciferol  2,000 Units Oral q morning   clopidogrel  75 mg Oral Daily   heparin  5,000 Units Subcutaneous Q8H   insulin aspart  0-20 Units Subcutaneous TID WC   insulin aspart  4 Units Subcutaneous TID WC   insulin glargine-yfgn  15 Units Subcutaneous Daily   levothyroxine  112 mcg Oral Q0600   methylPREDNISolone (SOLU-MEDROL) injection  60 mg Intravenous Q12H   nateglinide  30 mg Oral TID WC   pantoprazole  40 mg Oral Daily   tamsulosin  0.4 mg Oral Daily   Continuous Infusions: PRN Meds:.acetaminophen, albuterol, fluticasone, guaiFENesin-dextromethorphan, ondansetron **OR** ondansetron (ZOFRAN) IV   I have personally reviewed following labs and imaging studies  LABORATORY DATA: CBC: Recent Labs  Lab 05/12/21 0904 05/12/21 1459 05/13/21 0425  WBC 15.7*  --  13.7*  NEUTROABS 12.8*  --  11.9*  HGB 10.8* 11.6* 10.1*  HCT 33.7* 34.0* 30.7*  MCV 93.4  --  91.6  PLT 217  --  527    Basic Metabolic Panel: Recent Labs  Lab 05/12/21 0904 05/12/21 1459 05/13/21 0425  NA 133* 137 132*  131*  K 4.4 4.6 4.6  4.6  CL 106  --  104  104  CO2 16*  --  18*  18*  GLUCOSE 230*  --  267*  267*  BUN 65*  --  60*  60*  CREATININE 3.02*  --  2.79*  2.81*  CALCIUM 8.4*  --  8.1*  8.1*  MG  --   --  2.2  2.2  PHOS  --   --  2.2*  2.2*    GFR: Estimated Creatinine Clearance: 28 mL/min (A) (by C-G formula based on SCr of 2.81 mg/dL (H)).  Liver Function Tests: Recent Labs  Lab 05/13/21 0425  AST 24  24  ALT 38  42  ALKPHOS 60  61  BILITOT 0.4  0.5  PROT 6.5  6.5  ALBUMIN 2.5*  2.6*   No results for input(s): LIPASE, AMYLASE in the last 168 hours. No results for input(s): AMMONIA in the last 168  hours.  Coagulation Profile: No results for input(s): INR, PROTIME in the last 168 hours.  Cardiac Enzymes: No results for input(s): CKTOTAL, CKMB, CKMBINDEX, TROPONINI in the last 168 hours.  BNP (last 3 results) No results for input(s): PROBNP in the last 8760 hours.  Lipid Profile: No results for input(s): CHOL, HDL, LDLCALC, TRIG, CHOLHDL, LDLDIRECT in the last 72 hours.  Thyroid Function Tests: No results for input(s): TSH, T4TOTAL, FREET4, T3FREE, THYROIDAB in the last 72 hours.  Anemia Panel: Recent Labs    05/13/21 0425  FERRITIN 1,136*    Urine analysis:    Component Value Date/Time   COLORURINE YELLOW 05/12/2021 1014   APPEARANCEUR HAZY (A) 05/12/2021 1014  LABSPEC 1.018 05/12/2021 1014   PHURINE 5.0 05/12/2021 1014   GLUCOSEU 150 (A) 05/12/2021 1014   GLUCOSEU >=1000 (A) 10/23/2020 0937   HGBUR NEGATIVE 05/12/2021 1014   BILIRUBINUR NEGATIVE 05/12/2021 1014   KETONESUR NEGATIVE 05/12/2021 1014   PROTEINUR 30 (A) 05/12/2021 1014   UROBILINOGEN 0.2 10/23/2020 0937   NITRITE NEGATIVE 05/12/2021 1014   LEUKOCYTESUR NEGATIVE 05/12/2021 1014    Sepsis Labs: Lactic Acid, Venous No results found for: LATICACIDVEN  MICROBIOLOGY: Recent Results (from the past 240 hour(s))  Resp Panel by RT-PCR (Flu A&B, Covid) Nasopharyngeal Swab     Status: Abnormal   Collection Time: 05/12/21 10:31 AM   Specimen: Nasopharyngeal Swab; Nasopharyngeal(NP) swabs in vial transport medium  Result Value Ref Range Status   SARS Coronavirus 2 by RT PCR POSITIVE (A) NEGATIVE Final    Comment: RESULT CALLED TO, READ BACK BY AND VERIFIED WITH: RN C CHAPMAN [id number] AT 5329 BY CM (NOTE) SARS-CoV-2 target nucleic acids are DETECTED.  The SARS-CoV-2 RNA is generally detectable in upper respiratory specimens during the acute phase of infection. Positive results are indicative of the presence of the identified virus, but do not rule out bacterial infection or co-infection with other  pathogens not detected by the test. Clinical correlation with patient history and other diagnostic information is necessary to determine patient infection status. The expected result is Negative.  Fact Sheet for Patients: EntrepreneurPulse.com.au  Fact Sheet for Healthcare Providers: IncredibleEmployment.be  This test is not yet approved or cleared by the [place] FDA and  has been authorized for detection and/or diagnosis of SARS-CoV-2 by FDA under an Emergency Use Authorization (EUA).  This EUA will remain in effect (meaning this test can be  used) for the duration of  the COVID-19 declaration under Section 564(b)(1) of the Act, 21 U.S.C. section 360bbb-3(b)(1), unless the authorization is terminated or revoked sooner.     Influenza A by PCR NEGATIVE NEGATIVE Final   Influenza B by PCR NEGATIVE NEGATIVE Final    Comment: (NOTE) The Xpert Xpress SARS-CoV-2/FLU/RSV plus assay is intended as an aid in the diagnosis of influenza from Nasopharyngeal swab specimens and should not be used as a sole basis for treatment. Nasal washings and aspirates are unacceptable for Xpert Xpress SARS-CoV-2/FLU/RSV testing.  Fact Sheet for Patients: EntrepreneurPulse.com.au  Fact Sheet for Healthcare Providers: IncredibleEmployment.be  This test is not yet approved or cleared by the [place] FDA and has been authorized for detection and/or diagnosis of SARS-CoV-2 by FDA under an Emergency Use Authorization (EUA). This EUA will remain in effect (meaning this test can be used) for the duration of the COVID-19 declaration under Section 564(b)(1) of the Act, 21 U.S.C. section 360bbb-3(b)(1), unless the authorization is terminated or revoked.  Performed at [facility], [place] [address] [place], [place] [address]     RADIOLOGY STUDIES/RESULTS: DG Chest 2 View  Result Date: 05/12/2021 CLINICAL DATA:   80 year old male positive for COVID-19 1 week ago. Disoriented. EXAM: CHEST - 2 VIEW COMPARISON:  Chest radiographs 07/09/2013 and earlier. FINDINGS: Low lung volumes and mild respiratory motion. Stable cardiac size and mediastinal contours. Borderline cardiomegaly. Crowding of lung markings bilaterally. No pleural effusion or consolidation. Visualized tracheal air column is within normal limits. No pneumothorax. No acute osseous abnormality identified. Negative visible bowel gas. IMPRESSION: Low lung volumes with crowding of lung markings, difficult to exclude a mild viral pneumonia. But no consolidation or pleural effusion. Electronically Signed   By: Herminio Heads.D.  On: 05/12/2021 09:42   CT HEAD WO CONTRAST (5MM)  Result Date: 05/12/2021 CLINICAL DATA:  Delirium EXAM: CT HEAD WITHOUT CONTRAST TECHNIQUE: Contiguous axial images were obtained from the base of the skull through the vertex without intravenous contrast. COMPARISON:  11/08/2020 FINDINGS: Brain: There is no acute intracranial hemorrhage, mass effect, or edema. Gray-white differentiation is preserved. There is no extra-axial fluid collection. Ventricles and sulci are within normal limits in size and configuration. Minimal patchy low-density in the supratentorial white matter is nonspecific but may reflect minor chronic microvascular ischemic changes. Vascular: There is atherosclerotic calcification at the skull base. Skull: Calvarium is unremarkable. Sinuses/Orbits: Paranasal sinus mucosal thickening with air-fluid levels. Other: None. IMPRESSION: No acute intracranial abnormality. Electronically Signed   By: Macy Mis M.D.   On: 05/12/2021 11:03   US RENAL  Result Date: 05/12/2021 CLINICAL DATA:  Acute kidney injury EXAM: RENAL / URINARY TRACT ULTRASOUND COMPLETE COMPARISON:  12/10/2017 FINDINGS: Right Kidney: Renal measurements: 11.7 x 7.1 x 7.8 cm = volume: 335 mL. Echogenicity within normal limits. No mass or hydronephrosis visualized.  Left Kidney: Renal measurements: 11.4 x 5.7 x 4.4 cm = volume: 166 mL. Echogenicity within normal limits. No mass or hydronephrosis visualized. Bladder: Appears normal for degree of bladder distention. Other: Diffuse increased echogenicity of the visualized portions of the hepatic parenchyma are a nonspecific indicator of hepatocellular dysfunction, most commonly steatosis. IMPRESSION: No significant sonographic abnormality of the kidneys. Electronically Signed   By: Miachel Roux M.D.   On: 05/12/2021 14:14   DG Chest Port 1V same Day  Result Date: 05/13/2021 CLINICAL DATA:  Short of breath EXAM: PORTABLE CHEST 1 VIEW COMPARISON:  Prior chest x-ray yesterday FINDINGS: Stable cardiac and mediastinal contours. Slightly increased pulmonary vascular congestion. Persistent left basilar patchy airspace opacity likely reflecting atelectasis. No pneumothorax. No large effusion. No acute osseous abnormality. IMPRESSION: Increase in pulmonary vascular congestion without overt edema. Persistent patchy left basilar airspace opacity which may reflect atelectasis or infiltrate. Electronically Signed   By: Jacqulynn Cadet M.D.   On: 05/13/2021 08:15     LOS: 1 day   Oren Binet, MD  [facility]    To contact the attending provider between 7A-7P or the covering provider during after hours 7P-7A, please log into the web site [email or web address] and access using universal [place] password for that web site. If you do not have the password, please call the hospital operator.  05/13/2021, 11:29 AM

## 2021-05-13 NOTE — Progress Notes (Signed)
Received patient from ED. Patient settled into room. Initial vital signs show patient afebrile. Patient medicated as per PRN orders. Will reevaluate shortly. Charge nurse made aware.  05/13/21 0237  Assess: MEWS Score  Temp (!) 102.7 F (39.3 C)  BP (!) 176/66  Pulse Rate 100  ECG Heart Rate 100  Resp (!) 23  SpO2 93 %  Assess: MEWS Score  MEWS Temp 2  MEWS Systolic 0  MEWS Pulse 0  MEWS RR 1  MEWS LOC 0  MEWS Score 3  MEWS Score Color Yellow

## 2021-05-13 NOTE — Evaluation (Signed)
Physical Therapy Evaluation Patient Details Name: Joseph Villa MRN: [id number] DOB: [birth year] Today's Date: 05/13/2021  History of Present Illness  Joseph Villa is a 80 y.o. male who presented to the ER on 05/12/2021 with with dyspnea, hypoxia, fever. Tested positive for COVID ~1 week PTA but became more weak and fatigued.   PMH of GERD, hypothyroidism, hypertension, OSA, diabetes, CKD  Clinical Impression   Pt admitted secondary to problem above with deficits below. PTA patient was independent with mobility and ADLs. Pt currently requires min guard assist for safety due to minor imbalances with independent recovery. Patient with sats down to 88% on room air with walking x 150 ft.  Anticipate patient will benefit from PT to address problems listed below.Will continue to follow acutely to maximize functional mobility independence and safety.          Recommendations for follow up therapy are one component of a multi-disciplinary discharge planning process, led by the attending physician.  Recommendations may be updated based on patient status, additional functional criteria and insurance authorization.  Follow Up Recommendations No PT follow up    Assistance Recommended at Discharge Set up Supervision/Assistance  Functional Status Assessment Patient has had a recent decline in their functional status and demonstrates the ability to make significant improvements in function in a reasonable and predictable amount of time.  Equipment Recommendations  None recommended by PT    Recommendations for Other Services OT consult     Precautions / Restrictions Restrictions Weight Bearing Restrictions: No      Mobility  Bed Mobility Overal bed mobility: Independent                  Transfers Overall transfer level: Independent Equipment used: None                    Ambulation/Gait Ambulation/Gait assistance: Min Conservator, museum/gallery (Feet): 150 Feet Assistive  device: None Gait Pattern/deviations: Step-through pattern;Staggering left;Wide base of support   Gait velocity interpretation: 1.31 - 2.62 ft/sec, indicative of limited community ambulator   General Gait Details: several stagger steps which he recovered without assist; he stated this is not normal for him but he feels it is due to not walking for several days  Stairs            Wheelchair Mobility    Modified Rankin (Stroke Patients Only)       Balance Overall balance assessment: Needs assistance Sitting-balance support: No upper extremity supported;Feet unsupported Sitting balance-Leahy Scale: Good     Standing balance support: No upper extremity supported;During functional activity Standing balance-Leahy Scale: Fair                               Pertinent Vitals/Pain Pain Assessment: No/denies pain    Home Living Family/patient expects to be discharged to:: Private residence Living Arrangements: Spouse/significant other Available Help at Discharge: Family;Available 24 hours/day Type of Home: House Home Access: Stairs to enter Entrance Stairs-Rails: Psychiatric nurse of Steps: 4   Home Layout: One level Home Equipment: None      Prior Function Prior Level of Function : Independent/Modified Independent;Driving                     Hand Dominance   Dominant Hand: Right    Extremity/Trunk Assessment   Upper Extremity Assessment Upper Extremity Assessment: Defer to OT evaluation    Lower Extremity Assessment  Lower Extremity Assessment: Generalized weakness    Cervical / Trunk Assessment Cervical / Trunk Assessment: Normal  Communication   Communication: HOH  Cognition Arousal/Alertness: Awake/alert Behavior During Therapy: WFL for tasks assessed/performed Overall Cognitive Status: Within Functional Limits for tasks assessed                                          General Comments General  comments (skin integrity, edema, etc.): on 2L O2 with sats 96% on arrival; on room air for ambulation with sats down to 88%; at end of session sats 91% on RA and 2L O2 resumed    Exercises     Assessment/Plan    PT Assessment Patient needs continued PT services  PT Problem List Decreased strength;Decreased balance;Decreased mobility;Cardiopulmonary status limiting activity       PT Treatment Interventions DME instruction;Gait training;Functional mobility training;Stair training;Therapeutic activities;Therapeutic exercise;Balance training;Patient/family education    PT Goals (Current goals can be found in the Care Plan section)  Acute Rehab PT Goals Patient Stated Goal: get strength back PT Goal Formulation: With patient Time For Goal Achievement: 05/27/21 Potential to Achieve Goals: Good    Frequency Min 3X/week   Barriers to discharge        Co-evaluation               AM-PAC PT "6 Clicks" Mobility  Outcome Measure Help needed turning from your back to your side while in a flat bed without using bedrails?: None Help needed moving from lying on your back to sitting on the side of a flat bed without using bedrails?: None Help needed moving to and from a bed to a chair (including a wheelchair)?: None Help needed standing up from a chair using your arms (e.g., wheelchair or bedside chair)?: None Help needed to walk in hospital room?: A Little Help needed climbing 3-5 steps with a railing? : A Little 6 Click Score: 22    End of Session   Activity Tolerance: Patient tolerated treatment well Patient left: in bed;with call bell/phone within reach (RN oK'd no bed alarm) Nurse Communication: Mobility status PT Visit Diagnosis: Unsteadiness on feet (R26.81)    Time: 9622-2979 PT Time Calculation (min) (ACUTE ONLY): 15 min   Charges:   PT Evaluation $PT Eval Low Complexity: Campbell, PT Acute Rehabilitation Services  Pager [phone] Office  [phone]   Rexanne Mano 05/13/2021, 4:11 PM

## 2021-05-13 NOTE — Plan of Care (Signed)
Patient settled into room. Will monitor as per orders/care plan.

## 2021-05-14 ENCOUNTER — Encounter (HOSPITAL_COMMUNITY): Payer: Medicare Other

## 2021-05-14 LAB — CBC WITH DIFFERENTIAL/PLATELET
Abs Immature Granulocytes: 0.11 10*3/uL — ABNORMAL HIGH (ref 0.00–0.07)
Basophils Absolute: 0 10*3/uL (ref 0.0–0.1)
Basophils Relative: 0 %
Eosinophils Absolute: 0 10*3/uL (ref 0.0–0.5)
Eosinophils Relative: 0 %
HCT: 30 % — ABNORMAL LOW (ref 39.0–52.0)
Hemoglobin: 9.6 g/dL — ABNORMAL LOW (ref 13.0–17.0)
Immature Granulocytes: 1 %
Lymphocytes Relative: 6 %
Lymphs Abs: 0.8 10*3/uL (ref 0.7–4.0)
MCH: 29.7 pg (ref 26.0–34.0)
MCHC: 32 g/dL (ref 30.0–36.0)
MCV: 92.9 fL (ref 80.0–100.0)
Monocytes Absolute: 0.4 10*3/uL (ref 0.1–1.0)
Monocytes Relative: 3 %
Neutro Abs: 12.2 10*3/uL — ABNORMAL HIGH (ref 1.7–7.7)
Neutrophils Relative %: 90 %
Platelets: 257 10*3/uL (ref 150–400)
RBC: 3.23 MIL/uL — ABNORMAL LOW (ref 4.22–5.81)
RDW: 13.4 % (ref 11.5–15.5)
WBC: 13.6 10*3/uL — ABNORMAL HIGH (ref 4.0–10.5)
nRBC: 0 % (ref 0.0–0.2)

## 2021-05-14 LAB — COMPREHENSIVE METABOLIC PANEL
ALT: 37 U/L (ref 0–44)
AST: 21 U/L (ref 15–41)
Albumin: 2.4 g/dL — ABNORMAL LOW (ref 3.5–5.0)
Alkaline Phosphatase: 56 U/L (ref 38–126)
Anion gap: 11 (ref 5–15)
BUN: 76 mg/dL — ABNORMAL HIGH (ref 8–23)
CO2: 19 mmol/L — ABNORMAL LOW (ref 22–32)
Calcium: 8.4 mg/dL — ABNORMAL LOW (ref 8.9–10.3)
Chloride: 104 mmol/L (ref 98–111)
Creatinine, Ser: 3.02 mg/dL — ABNORMAL HIGH (ref 0.61–1.24)
GFR, Estimated: 20 mL/min — ABNORMAL LOW (ref >60)
Glucose, Bld: 260 mg/dL — ABNORMAL HIGH (ref 70–99)
Potassium: 5.1 mmol/L (ref 3.5–5.1)
Sodium: 134 mmol/L — ABNORMAL LOW (ref 135–145)
Total Bilirubin: 0.4 mg/dL (ref 0.3–1.2)
Total Protein: 6.3 g/dL — ABNORMAL LOW (ref 6.5–8.1)

## 2021-05-14 LAB — GLUCOSE, CAPILLARY
Glucose-Capillary: 243 mg/dL — ABNORMAL HIGH (ref 70–99)
Glucose-Capillary: 200 mg/dL — ABNORMAL HIGH (ref 70–99)
Glucose-Capillary: 270 mg/dL — ABNORMAL HIGH (ref 70–99)
Glucose-Capillary: 202 mg/dL — ABNORMAL HIGH (ref 70–99)

## 2021-05-14 LAB — D-DIMER, QUANTITATIVE: D-Dimer, Quant: 3.15 ug/mL-FEU — ABNORMAL HIGH (ref 0.00–0.50)

## 2021-05-14 LAB — MAGNESIUM: Magnesium: 2.4 mg/dL (ref 1.7–2.4)

## 2021-05-14 LAB — PROCALCITONIN: Procalcitonin: 2.28 ng/mL

## 2021-05-14 LAB — C-REACTIVE PROTEIN: CRP: 44.1 mg/dL — ABNORMAL HIGH (ref <1.0)

## 2021-05-14 MED ORDER — INSULIN GLARGINE-YFGN 100 UNIT/ML ~~LOC~~ SOLN
20.0000 [IU] | Freq: Every day | SUBCUTANEOUS | Status: DC
  Administered 2021-05-14: 20 [IU] via SUBCUTANEOUS
  Filled 2021-05-14 (×2): qty 0.2

## 2021-05-14 MED ORDER — INSULIN ASPART 100 UNIT/ML IJ SOLN
10.0000 [IU] | Freq: Three times a day (TID) | INTRAMUSCULAR | Status: DC
  Administered 2021-05-14 – 2021-05-18 (×11): 10 [IU] via SUBCUTANEOUS

## 2021-05-14 MED ORDER — GUAIFENESIN ER 600 MG PO TB12
600.0000 mg | ORAL_TABLET | Freq: Two times a day (BID) | ORAL | Status: DC
  Administered 2021-05-14 – 2021-05-18 (×9): 600 mg via ORAL
  Filled 2021-05-14 (×9): qty 1

## 2021-05-14 MED ORDER — ALBUTEROL SULFATE HFA 108 (90 BASE) MCG/ACT IN AERS
2.0000 | INHALATION_SPRAY | Freq: Two times a day (BID) | RESPIRATORY_TRACT | Status: DC
  Administered 2021-05-15 – 2021-05-18 (×7): 2 via RESPIRATORY_TRACT
  Filled 2021-05-14: qty 6.7

## 2021-05-14 MED ORDER — SODIUM CHLORIDE 0.9 % IV SOLN: INTRAVENOUS | Status: AC

## 2021-05-14 MED ORDER — INSULIN ASPART 100 UNIT/ML IJ SOLN
6.0000 [IU] | Freq: Three times a day (TID) | INTRAMUSCULAR | Status: DC
  Administered 2021-05-14: 6 [IU] via SUBCUTANEOUS

## 2021-05-14 MED ORDER — BENZONATATE 100 MG PO CAPS
200.0000 mg | ORAL_CAPSULE | Freq: Three times a day (TID) | ORAL | Status: DC
  Administered 2021-05-14 – 2021-05-18 (×13): 200 mg via ORAL
  Filled 2021-05-14 (×13): qty 2

## 2021-05-14 NOTE — Progress Notes (Signed)
Physical Therapy Treatment Patient Details Name: Joseph Villa MRN: [id number] DOB: 09/05/1940 Today's Date: 05/14/2021   History of Present Illness Joseph Villa is a 80 y.o. male who presented to the ER on 05/12/2021 with with dyspnea, hypoxia, fever. Tested positive for COVID ~1 week PTA but became more weak and fatigued.   PMH of GERD, hypothyroidism, hypertension, OSA, diabetes, CKD    PT Comments    Patient on 4L O2 on arrival with sats 91%. Remained on 4L for ambulation x 100 ft with sats 88%.    Recommendations for follow up therapy are one component of a multi-disciplinary discharge planning process, led by the attending physician.  Recommendations may be updated based on patient status, additional functional criteria and insurance authorization.  Follow Up Recommendations  No PT follow up     Assistance Recommended at Discharge Set up Supervision/Assistance  Equipment Recommendations  None recommended by PT    Recommendations for Other Services       Precautions / Restrictions Precautions Precautions: Fall Precaution Comments: watch O2     Mobility  Bed Mobility Overal bed mobility: Independent                  Transfers Overall transfer level: Needs assistance Equipment used: None Transfers: Sit to/from Stand Sit to Stand: Min guard           General transfer comment: min guard for safety    Ambulation/Gait Ambulation/Gait assistance: Min guard Gait Distance (Feet): 100 Feet Assistive device: None Gait Pattern/deviations: Step-through pattern;Staggering left;Wide base of support   Gait velocity interpretation: 1.31 - 2.62 ft/sec, indicative of limited community ambulator   General Gait Details: no staggering noted; sats 88% on 4L   Stairs             Wheelchair Mobility    Modified Rankin (Stroke Patients Only)       Balance Overall balance assessment: Needs assistance Sitting-balance support: No upper extremity  supported;Feet unsupported Sitting balance-Leahy Scale: Good     Standing balance support: No upper extremity supported;During functional activity Standing balance-Leahy Scale: Fair                              Cognition Arousal/Alertness: Awake/alert Behavior During Therapy: WFL for tasks assessed/performed Overall Cognitive Status: Within Functional Limits for tasks assessed                                          Exercises      General Comments        Pertinent Vitals/Pain Pain Assessment: No/denies pain Breathing: normal Negative Vocalization: none Facial Expression: smiling or inexpressive Body Language: relaxed Consolability: no need to console PAINAD Score: 0    Home Living                          Prior Function            PT Goals (current goals can now be found in the care plan section) Acute Rehab PT Goals Patient Stated Goal: get strength back PT Goal Formulation: With patient Time For Goal Achievement: 05/27/21 Potential to Achieve Goals: Good Progress towards PT goals: Progressing toward goals    Frequency    Min 3X/week      PT Plan Current plan remains appropriate  Co-evaluation              AM-PAC PT "6 Clicks" Mobility   Outcome Measure  Help needed turning from your back to your side while in a flat bed without using bedrails?: None Help needed moving from lying on your back to sitting on the side of a flat bed without using bedrails?: None Help needed moving to and from a bed to a chair (including a wheelchair)?: None Help needed standing up from a chair using your arms (e.g., wheelchair or bedside chair)?: None Help needed to walk in hospital room?: A Little Help needed climbing 3-5 steps with a railing? : A Little 6 Click Score: 22    End of Session Equipment Utilized During Treatment: Oxygen Activity Tolerance: Patient tolerated treatment well Patient left: with call  bell/phone within reach;in chair (RN oK'd no bed alarm)   PT Visit Diagnosis: Unsteadiness on feet (R26.81)     Time: 2725-3664 PT Time Calculation (min) (ACUTE ONLY): 23 min  Charges:  $Gait Training: 23-37 mins                      Joseph Villa, PT Acute Rehabilitation Services  Pager [phone] Office [phone]    Joseph Villa 05/14/2021, 2:51 PM

## 2021-05-14 NOTE — Progress Notes (Signed)
PROGRESS NOTE        PATIENT DETAILS Name: Joseph Villa Age: 80 y.o. Sex: male Date of Birth: 29-Oct-1940 Admit Date: 05/12/2021 Admitting Physician Delfino Lovett Cyndra Numbers, DO SAY:TKZS, Hunt Oris, MD  Brief Narrative: Patient is a 80 y.o. male with history of GERD, HTN, hypothyroidism, DM-2, CKD stage IIIb-who has been having cough-for approximately around 8 days prior to this hospitalization-subsequently diagnosed with COVID-19 around 4-5 days prior to this hospitalization-presenting with worsening cough/shortness of breath and fever-found to have acute hypoxic respiratory failure due to COVID-19 pneumonia.  See below for further details.    Subjective: Lying comfortably in bed-breathing is better-some cough continues.  Objective: Vitals: Blood pressure (!) 125/59, pulse 70, temperature 98.6 F (37 C), temperature source Axillary, resp. rate 17, height 5' 11.5" (1.816 m), weight 113.4 kg, SpO2 95 %.   Exam: Gen Exam:Alert awake-not in any distress HEENT:atraumatic, normocephalic Chest: B/L clear to auscultation anteriorly CVS:S1S2 regular Abdomen:soft non tender, non distended Extremities:no edema Neurology: Non focal Skin: no rash   Pertinent Labs/Radiology: Recent Labs  Lab 05/13/21 0425 05/14/21 0150  WBC 13.7* 13.6*  HGB 10.1* 9.6*  PLT 221 257  NA 132*  131* 134*  K 4.6  4.6 5.1  CREATININE 2.79*  2.81* 3.02*  AST 24  24 21   ALT 38  42 37  ALKPHOS 60  61 56  BILITOT 0.4  0.5 0.4    Assessment/Plan: Acute hypoxic respiratory failure due to COVID-19 pneumonitis: Continues to have mild hypoxia-CRP remains significantly elevated.suspicion for COVID pneumonitis-and possible superimposed bacterial pneumonia.  Continue steroids/empiric levofloxacin-and other supportive care.  Have asked RN to see if we can titrate off oxygen.   Recent Labs    05/13/21 0425 05/13/21 0732 05/14/21 0150  DDIMER  --  3.83* 3.15*  FERRITIN 1,136*  --    --   CRP 43.7*  --  44.1*    AKI on CKD stage IIIb: AKI likely hemodynamically mediated-renal ultrasound without hydronephrosis-UA with minimal proteinuria-creatinine continues to worsen-continue to avoid nephrotoxic agents-check frequent bladder scans-gently hydrate for a few hours today.  Follow electrolytes closely-if renal function continues to worsen-we will need nephrology evaluation.  Lab Results  Component Value Date   CREATININE 3.02 (H) 05/14/2021   CREATININE 2.81 (H) 05/13/2021   CREATININE 2.79 (H) 05/13/2021      Elevated D-dimer: Likely due to COVID-19 infection-has mild hypoxemia-check lower extremity Doppler.  Follow D-dimer trend.  On prophylactic heparin.  History of CVA-bilateral carotid artery stenosis-s/p left TCAR 2022: Continue aspirin/Plavix-nonfocal exam.  DM-2 (A1c 6.4 on 10/22) with uncontrolled hyperglycemia due to steroids: CBGs remain elevated-we will get first dose of 20 units of Semglee today-increase Premeal NovoLog to 10 units-and reassess on 12/13.   Recent Labs    05/13/21 1726 05/13/21 2128 05/14/21 0732  GLUCAP 329* 239* 243*     Hypothyroidism: Continue with levothyroxine  WFU:XNATFTDD Flomax-follow bladder scans  Mood disorder: Continue Tegretol.  Obesity Estimated body mass index is 34.38 kg/m as calculated from the following:   Height as of this encounter: 5' 11.5" (1.816 m).   Weight as of this encounter: 113.4 kg.   Procedures: None Consults: None DVT Prophylaxis: Heparin Code Status:Full code Family Communication: Spouse-Carol-[phone] updated over the phone.  Time spent: 25 minutes-Greater than 50% of this time was spent in counseling, explanation of diagnosis, planning of further  management, and coordination of care.   Disposition Plan: Status is: Inpatient  Remains inpatient appropriate because: Hypoxia due to likely COVID-19 pneumonia/superimposed bacterial pneumonia.    Diet: Diet Order             Diet  Carb Modified Fluid consistency: Thin; Room service appropriate? Yes  Diet effective now                     Antimicrobial agents: Anti-infectives (From admission, onward)    Start     Dose/Rate Route Frequency Ordered Stop   05/14/21 1000  levofloxacin (LEVAQUIN) tablet 250 mg       See Hyperspace for full Linked Orders Report.   250 mg Oral Daily 05/13/21 1210 05/16/21 0959   05/13/21 1300  levofloxacin (LEVAQUIN) tablet 500 mg       See Hyperspace for full Linked Orders Report.   500 mg Oral Daily 05/13/21 1210 05/13/21 1304   05/13/21 1230  azithromycin (ZITHROMAX) tablet 500 mg  Status:  Discontinued        500 mg Oral Daily 05/13/21 1141 05/13/21 1207        MEDICATIONS: Scheduled Meds:  albuterol  2 puff Inhalation Q6H   aspirin  81 mg Oral Daily   bromocriptine  1.25 mg Oral Daily   carbamazepine  400 mg Oral QHS   cholecalciferol  2,000 Units Oral q morning   clopidogrel  75 mg Oral Daily   heparin  5,000 Units Subcutaneous Q8H   insulin aspart  0-20 Units Subcutaneous TID WC   insulin aspart  6 Units Subcutaneous TID WC   insulin glargine-yfgn  20 Units Subcutaneous Daily   levofloxacin  250 mg Oral Daily   levothyroxine  112 mcg Oral Q0600   methylPREDNISolone (SOLU-MEDROL) injection  60 mg Intravenous Q12H   pantoprazole  40 mg Oral Daily   tamsulosin  0.4 mg Oral Daily   Continuous Infusions:  sodium chloride 75 mL/hr at 05/14/21 0901   PRN Meds:.acetaminophen, albuterol, fluticasone, guaiFENesin-dextromethorphan, ondansetron **OR** ondansetron (ZOFRAN) IV   I have personally reviewed following labs and imaging studies  LABORATORY DATA: CBC: Recent Labs  Lab 05/12/21 0904 05/12/21 1459 05/13/21 0425 05/14/21 0150  WBC 15.7*  --  13.7* 13.6*  NEUTROABS 12.8*  --  11.9* 12.2*  HGB 10.8* 11.6* 10.1* 9.6*  HCT 33.7* 34.0* 30.7* 30.0*  MCV 93.4  --  91.6 92.9  PLT 217  --  221 257     Basic Metabolic Panel: Recent Labs  Lab  05/12/21 0904 05/12/21 1459 05/13/21 0425 05/14/21 0150  NA 133* 137 132*  131* 134*  K 4.4 4.6 4.6  4.6 5.1  CL 106  --  104  104 104  CO2 16*  --  18*  18* 19*  GLUCOSE 230*  --  267*  267* 260*  BUN 65*  --  60*  60* 76*  CREATININE 3.02*  --  2.79*  2.81* 3.02*  CALCIUM 8.4*  --  8.1*  8.1* 8.4*  MG  --   --  2.2  2.2 2.4  PHOS  --   --  2.2*  2.2*  --      GFR: Estimated Creatinine Clearance: 26 mL/min (A) (by C-G formula based on SCr of 3.02 mg/dL (H)).  Liver Function Tests: Recent Labs  Lab 05/13/21 0425 05/14/21 0150  AST 24  24 21   ALT 38  42 37  ALKPHOS 60  61 56  BILITOT 0.4  0.5 0.4  PROT 6.5  6.5 6.3*  ALBUMIN 2.5*  2.6* 2.4*    No results for input(s): LIPASE, AMYLASE in the last 168 hours. No results for input(s): AMMONIA in the last 168 hours.  Coagulation Profile: No results for input(s): INR, PROTIME in the last 168 hours.  Cardiac Enzymes: No results for input(s): CKTOTAL, CKMB, CKMBINDEX, TROPONINI in the last 168 hours.  BNP (last 3 results) No results for input(s): PROBNP in the last 8760 hours.  Lipid Profile: No results for input(s): CHOL, HDL, LDLCALC, TRIG, CHOLHDL, LDLDIRECT in the last 72 hours.  Thyroid Function Tests: No results for input(s): TSH, T4TOTAL, FREET4, T3FREE, THYROIDAB in the last 72 hours.  Anemia Panel: Recent Labs    05/13/21 0425  FERRITIN 1,136*     Urine analysis:    Component Value Date/Time   COLORURINE YELLOW 05/12/2021 1014   APPEARANCEUR HAZY (A) 05/12/2021 1014   LABSPEC 1.018 05/12/2021 1014   PHURINE 5.0 05/12/2021 1014   GLUCOSEU 150 (A) 05/12/2021 1014   GLUCOSEU >=1000 (A) 10/23/2020 0937   HGBUR NEGATIVE 05/12/2021 1014   BILIRUBINUR NEGATIVE 05/12/2021 1014   KETONESUR NEGATIVE 05/12/2021 1014   PROTEINUR 30 (A) 05/12/2021 1014   UROBILINOGEN 0.2 10/23/2020 0937   NITRITE NEGATIVE 05/12/2021 1014   LEUKOCYTESUR NEGATIVE 05/12/2021 1014    Sepsis Labs: Lactic  Acid, Venous No results found for: LATICACIDVEN  MICROBIOLOGY: Recent Results (from the past 240 hour(s))  Resp Panel by RT-PCR (Flu A&B, Covid) Nasopharyngeal Swab     Status: Abnormal   Collection Time: 05/12/21 10:31 AM   Specimen: Nasopharyngeal Swab; Nasopharyngeal(NP) swabs in vial transport medium  Result Value Ref Range Status   SARS Coronavirus 2 by RT PCR POSITIVE (A) NEGATIVE Final    Comment: RESULT CALLED TO, READ BACK BY AND VERIFIED WITH: RN C CHAPMAN [id number] AT [id number] BY CM (NOTE) SARS-CoV-2 target nucleic acids are DETECTED.  The SARS-CoV-2 RNA is generally detectable in upper respiratory specimens during the acute phase of infection. Positive results are indicative of the presence of the identified virus, but do not rule out bacterial infection or co-infection with other pathogens not detected by the test. Clinical correlation with patient history and other diagnostic information is necessary to determine patient infection status. The expected result is Negative.  Fact Sheet for Patients: EntrepreneurPulse.com.au  Fact Sheet for Healthcare Providers: IncredibleEmployment.be  This test is not yet approved or cleared by the [place] FDA and  has been authorized for detection and/or diagnosis of SARS-CoV-2 by FDA under an Emergency Use Authorization (EUA).  This EUA will remain in effect (meaning this test can be  used) for the duration of  the COVID-19 declaration under Section 564(b)(1) of the Act, 21 U.S.C. section 360bbb-3(b)(1), unless the authorization is terminated or revoked sooner.     Influenza A by PCR NEGATIVE NEGATIVE Final   Influenza B by PCR NEGATIVE NEGATIVE Final    Comment: (NOTE) The Xpert Xpress SARS-CoV-2/FLU/RSV plus assay is intended as an aid in the diagnosis of influenza from Nasopharyngeal swab specimens and should not be used as a sole basis for treatment. Nasal washings and aspirates are  unacceptable for Xpert Xpress SARS-CoV-2/FLU/RSV testing.  Fact Sheet for Patients: EntrepreneurPulse.com.au  Fact Sheet for Healthcare Providers: IncredibleEmployment.be  This test is not yet approved or cleared by the [place] FDA and has been authorized for detection and/or diagnosis of SARS-CoV-2 by FDA under an Emergency Use Authorization (EUA). This EUA will remain in effect (  meaning this test can be used) for the duration of the COVID-19 declaration under Section 564(b)(1) of the Act, 21 U.S.C. section 360bbb-3(b)(1), unless the authorization is terminated or revoked.  Performed at [facility], [place] [address] [place], [place] [address]     RADIOLOGY STUDIES/RESULTS: CT HEAD WO CONTRAST (5MM)  Result Date: 05/12/2021 CLINICAL DATA:  Delirium EXAM: CT HEAD WITHOUT CONTRAST TECHNIQUE: Contiguous axial images were obtained from the base of the skull through the vertex without intravenous contrast. COMPARISON:  11/08/2020 FINDINGS: Brain: There is no acute intracranial hemorrhage, mass effect, or edema. Gray-white differentiation is preserved. There is no extra-axial fluid collection. Ventricles and sulci are within normal limits in size and configuration. Minimal patchy low-density in the supratentorial white matter is nonspecific but may reflect minor chronic microvascular ischemic changes. Vascular: There is atherosclerotic calcification at the skull base. Skull: Calvarium is unremarkable. Sinuses/Orbits: Paranasal sinus mucosal thickening with air-fluid levels. Other: None. IMPRESSION: No acute intracranial abnormality. Electronically Signed   By: Macy Mis M.D.   On: 05/12/2021 11:03   US RENAL  Result Date: 05/12/2021 CLINICAL DATA:  Acute kidney injury EXAM: RENAL / URINARY TRACT ULTRASOUND COMPLETE COMPARISON:  12/10/2017 FINDINGS: Right Kidney: Renal measurements: 11.7 x 7.1 x 7.8 cm = volume: 335 mL. Echogenicity within  normal limits. No mass or hydronephrosis visualized. Left Kidney: Renal measurements: 11.4 x 5.7 x 4.4 cm = volume: 166 mL. Echogenicity within normal limits. No mass or hydronephrosis visualized. Bladder: Appears normal for degree of bladder distention. Other: Diffuse increased echogenicity of the visualized portions of the hepatic parenchyma are a nonspecific indicator of hepatocellular dysfunction, most commonly steatosis. IMPRESSION: No significant sonographic abnormality of the kidneys. Electronically Signed   By: Miachel Roux M.D.   On: 05/12/2021 14:14   DG Chest Port 1V same Day  Result Date: 05/13/2021 CLINICAL DATA:  Short of breath EXAM: PORTABLE CHEST 1 VIEW COMPARISON:  Prior chest x-ray yesterday FINDINGS: Stable cardiac and mediastinal contours. Slightly increased pulmonary vascular congestion. Persistent left basilar patchy airspace opacity likely reflecting atelectasis. No pneumothorax. No large effusion. No acute osseous abnormality. IMPRESSION: Increase in pulmonary vascular congestion without overt edema. Persistent patchy left basilar airspace opacity which may reflect atelectasis or infiltrate. Electronically Signed   By: Jacqulynn Cadet M.D.   On: 05/13/2021 08:15     LOS: 2 days   Oren Binet, MD  [facility]    To contact the attending provider between 7A-7P or the covering provider during after hours 7P-7A, please log into the web site [email or web address] and access using universal [facility] password for that web site. If you do not have the password, please call the hospital operator.  05/14/2021, 10:45 AM

## 2021-05-14 NOTE — Progress Notes (Signed)
Pt states he does not tolerate CPAP, that he cannot get the CPAP to fit right so he has been doing without.

## 2021-05-15 ENCOUNTER — Inpatient Hospital Stay (HOSPITAL_COMMUNITY): Payer: Medicare Other

## 2021-05-15 DIAGNOSIS — M7989 Other specified soft tissue disorders: Secondary | ICD-10-CM

## 2021-05-15 LAB — D-DIMER, QUANTITATIVE: D-Dimer, Quant: 1.72 ug/mL-FEU — ABNORMAL HIGH (ref 0.00–0.50)

## 2021-05-15 LAB — CBC WITH DIFFERENTIAL/PLATELET
Abs Immature Granulocytes: 0.18 10*3/uL — ABNORMAL HIGH (ref 0.00–0.07)
Basophils Absolute: 0 10*3/uL (ref 0.0–0.1)
Basophils Relative: 0 %
Eosinophils Absolute: 0 10*3/uL (ref 0.0–0.5)
Eosinophils Relative: 0 %
HCT: 28.6 % — ABNORMAL LOW (ref 39.0–52.0)
Hemoglobin: 9.2 g/dL — ABNORMAL LOW (ref 13.0–17.0)
Immature Granulocytes: 1 %
Lymphocytes Relative: 5 %
Lymphs Abs: 0.8 10*3/uL (ref 0.7–4.0)
MCH: 29.8 pg (ref 26.0–34.0)
MCHC: 32.2 g/dL (ref 30.0–36.0)
MCV: 92.6 fL (ref 80.0–100.0)
Monocytes Absolute: 0.7 10*3/uL (ref 0.1–1.0)
Monocytes Relative: 4 %
Neutro Abs: 14.9 10*3/uL — ABNORMAL HIGH (ref 1.7–7.7)
Neutrophils Relative %: 90 %
Platelets: 281 10*3/uL (ref 150–400)
RBC: 3.09 MIL/uL — ABNORMAL LOW (ref 4.22–5.81)
RDW: 13.4 % (ref 11.5–15.5)
WBC: 16.6 10*3/uL — ABNORMAL HIGH (ref 4.0–10.5)
nRBC: 0 % (ref 0.0–0.2)

## 2021-05-15 LAB — COMPREHENSIVE METABOLIC PANEL
ALT: 43 U/L (ref 0–44)
AST: 29 U/L (ref 15–41)
Albumin: 2.3 g/dL — ABNORMAL LOW (ref 3.5–5.0)
Alkaline Phosphatase: 57 U/L (ref 38–126)
Anion gap: 10 (ref 5–15)
BUN: 86 mg/dL — ABNORMAL HIGH (ref 8–23)
CO2: 18 mmol/L — ABNORMAL LOW (ref 22–32)
Calcium: 8.3 mg/dL — ABNORMAL LOW (ref 8.9–10.3)
Chloride: 105 mmol/L (ref 98–111)
Creatinine, Ser: 2.99 mg/dL — ABNORMAL HIGH (ref 0.61–1.24)
GFR, Estimated: 21 mL/min — ABNORMAL LOW (ref >60)
Glucose, Bld: 216 mg/dL — ABNORMAL HIGH (ref 70–99)
Potassium: 5.1 mmol/L (ref 3.5–5.1)
Sodium: 133 mmol/L — ABNORMAL LOW (ref 135–145)
Total Bilirubin: 0.4 mg/dL (ref 0.3–1.2)
Total Protein: 6 g/dL — ABNORMAL LOW (ref 6.5–8.1)

## 2021-05-15 LAB — GLUCOSE, CAPILLARY
Glucose-Capillary: 154 mg/dL — ABNORMAL HIGH (ref 70–99)
Glucose-Capillary: 188 mg/dL — ABNORMAL HIGH (ref 70–99)
Glucose-Capillary: 209 mg/dL — ABNORMAL HIGH (ref 70–99)
Glucose-Capillary: 143 mg/dL — ABNORMAL HIGH (ref 70–99)

## 2021-05-15 LAB — MAGNESIUM: Magnesium: 2.5 mg/dL — ABNORMAL HIGH (ref 1.7–2.4)

## 2021-05-15 LAB — PROCALCITONIN: Procalcitonin: 1.29 ng/mL

## 2021-05-15 LAB — C-REACTIVE PROTEIN: CRP: 27.1 mg/dL — ABNORMAL HIGH (ref <1.0)

## 2021-05-15 LAB — PHOSPHORUS: Phosphorus: 4.1 mg/dL (ref 2.5–4.6)

## 2021-05-15 MED ORDER — INSULIN GLARGINE-YFGN 100 UNIT/ML ~~LOC~~ SOLN
25.0000 [IU] | Freq: Every day | SUBCUTANEOUS | Status: DC
  Administered 2021-05-15 – 2021-05-18 (×4): 25 [IU] via SUBCUTANEOUS
  Filled 2021-05-15 (×4): qty 0.25

## 2021-05-15 NOTE — Progress Notes (Signed)
PROGRESS NOTE        PATIENT DETAILS Name: Joseph Villa Age: 80 y.o. Sex: male Date of Birth: [birth year] Admit Date: 05/12/2021 Admitting Physician Delfino Lovett Cyndra Numbers, DO ZNB:VAPO, Hunt Oris, MD  Brief Narrative: Patient is a 80 y.o. male with history of GERD, HTN, hypothyroidism, DM-2, CKD stage IIIb-who has been having cough-for approximately around 8 days prior to this hospitalization-subsequently diagnosed with COVID-19 around 4-5 days prior to this hospitalization-presenting with worsening cough/shortness of breath and fever-found to have acute hypoxic respiratory failure due to COVID-19 pneumonia.  See below for further details.    Subjective: Lying comfortably in bed-feels better-still with coughing spells-on 2 L of oxygen this morning.  Objective: Vitals: Blood pressure 130/63, pulse 66, temperature (!) 96.8 F (36 C), temperature source Oral, resp. rate 17, height 5' 11.5" (1.816 m), weight 113.4 kg, SpO2 96 %.   Exam: Gen Exam:Alert awake-not in any distress HEENT:atraumatic, normocephalic Chest: B/L clear to auscultation anteriorly CVS:S1S2 regular Abdomen:soft non tender, non distended Extremities:no edema Neurology: Non focal Skin: no rash   Pertinent Labs/Radiology: Recent Labs  Lab 05/13/21 0425 05/14/21 0150 05/15/21 0142  WBC 13.7* 13.6* 16.6*  HGB 10.1* 9.6* 9.2*  PLT 221 257 281  NA 132*   131* 134* 133*  K 4.6   4.6 5.1 5.1  CREATININE 2.79*   2.81* 3.02* 2.99*  AST 24   24 21 29   ALT 38   42 37 43  ALKPHOS 60   61 56 57  BILITOT 0.4   0.5 0.4 0.4    Assessment/Plan: Acute hypoxic respiratory failure due to COVID-19 pneumonitis: Overall improved-CRP downtrending-minimal hypoxia-continue steroids-we will complete levofloxacin today.  Continue with attempts to titrate down FiO2.  May need to assess assess for home O2 requirement prior to discharge.  Recent Labs    05/13/21 0425 05/13/21 0732 05/14/21 0150  05/15/21 0142  DDIMER  --  3.83* 3.15* 1.72*  FERRITIN 1,136*  --   --   --   CRP 43.7*  --  44.1* 27.1*    AKI on CKD stage IIIb: AKI likely hemodynamically mediated-renal ultrasound without hydronephrosis-UA with minimal proteinuria-creatinine seems to have plateaued-continue to avoid nephrotoxic agents-continue to monitor bladder scans periodically.  Hopeful that renal function will continue to improve-follow electrolytes. evaluation.  Lab Results  Component Value Date   CREATININE 2.99 (H) 05/15/2021   CREATININE 3.02 (H) 05/14/2021   CREATININE 2.81 (H) 05/13/2021   CREATININE 2.79 (H) 05/13/2021      Elevated D-dimer: Likely due to COVID-19 infection-has mild hypoxemia- awaiting lower extremity Doppler follow D-dimer trend.  On prophylactic heparin.  History of CVA-bilateral carotid artery stenosis-s/p left TCAR 2022: Continue aspirin/Plavix-nonfocal exam.  DM-2 (A1c 6.4 on 10/22) with uncontrolled hyperglycemia due to steroids: CBGs remain elevated-increase Semglee to 25 units-continue 10 units of Premeal NovoLog.  Reassess and adjust accordingly.   Recent Labs    05/14/21 1644 05/14/21 2058 05/15/21 0722  GLUCAP 270* 202* 154*     Hypothyroidism: Continue with levothyroxine  LID:CVUDTHYH Flomax-follow bladder scans  Mood disorder: Continue Tegretol.  Obesity Estimated body mass index is 34.38 kg/m as calculated from the following:   Height as of this encounter: 5' 11.5" (1.816 m).   Weight as of this encounter: 113.4 kg.   Procedures: None Consults: None DVT Prophylaxis: Heparin Code Status:Full code Family Communication: [phone]  updated over the phone on 12/13.  Time spent: 25 minutes-Greater than 50% of this time was spent in counseling, explanation of diagnosis, planning of further management, and coordination of care.   Disposition Plan: Status is: Inpatient  Remains inpatient appropriate because: Hypoxia due to likely COVID-19  pneumonia/superimposed bacterial pneumonia.    Diet: Diet Order             Diet Carb Modified Fluid consistency: Thin; Room service appropriate? No  Diet effective now                     Antimicrobial agents: Anti-infectives (From admission, onward)    Start     Dose/Rate Route Frequency Ordered Stop   05/14/21 1000  levofloxacin (LEVAQUIN) tablet 250 mg       See Hyperspace for full Linked Orders Report.   250 mg Oral Daily 05/13/21 1210 05/15/21 0929   05/13/21 1300  levofloxacin (LEVAQUIN) tablet 500 mg       See Hyperspace for full Linked Orders Report.   500 mg Oral Daily 05/13/21 1210 05/13/21 1304   05/13/21 1230  azithromycin (ZITHROMAX) tablet 500 mg  Status:  Discontinued        500 mg Oral Daily 05/13/21 1141 05/13/21 1207        MEDICATIONS: Scheduled Meds:  albuterol  2 puff Inhalation BID   aspirin  81 mg Oral Daily   benzonatate  200 mg Oral TID   bromocriptine  1.25 mg Oral Daily   carbamazepine  400 mg Oral QHS   cholecalciferol  2,000 Units Oral q morning   clopidogrel  75 mg Oral Daily   guaiFENesin  600 mg Oral BID   heparin  5,000 Units Subcutaneous Q8H   insulin aspart  0-20 Units Subcutaneous TID WC   insulin aspart  10 Units Subcutaneous TID WC   insulin glargine-yfgn  25 Units Subcutaneous Daily   levothyroxine  112 mcg Oral Q0600   methylPREDNISolone (SOLU-MEDROL) injection  60 mg Intravenous Q12H   pantoprazole  40 mg Oral Daily   tamsulosin  0.4 mg Oral Daily   Continuous Infusions:  PRN Meds:.acetaminophen, albuterol, fluticasone, ondansetron **OR** ondansetron (ZOFRAN) IV   I have personally reviewed following labs and imaging studies  LABORATORY DATA: CBC: Recent Labs  Lab 05/12/21 0904 05/12/21 1459 05/13/21 0425 05/14/21 0150 05/15/21 0142  WBC 15.7*  --  13.7* 13.6* 16.6*  NEUTROABS 12.8*  --  11.9* 12.2* 14.9*  HGB 10.8* 11.6* 10.1* 9.6* 9.2*  HCT 33.7* 34.0* 30.7* 30.0* 28.6*  MCV 93.4  --  91.6 92.9 92.6   PLT 217  --  221 257 281     Basic Metabolic Panel: Recent Labs  Lab 05/12/21 0904 05/12/21 1459 05/13/21 0425 05/14/21 0150 05/15/21 0142  NA 133* 137 132*   131* 134* 133*  K 4.4 4.6 4.6   4.6 5.1 5.1  CL 106  --  104   104 104 105  CO2 16*  --  18*   18* 19* 18*  GLUCOSE 230*  --  267*   267* 260* 216*  BUN 65*  --  60*   60* 76* 86*  CREATININE 3.02*  --  2.79*   2.81* 3.02* 2.99*  CALCIUM 8.4*  --  8.1*   8.1* 8.4* 8.3*  MG  --   --  2.2   2.2 2.4 2.5*  PHOS  --   --  2.2*   2.2*  --  4.1  GFR: Estimated Creatinine Clearance: 26.3 mL/min (A) (by C-G formula based on SCr of 2.99 mg/dL (H)).  Liver Function Tests: Recent Labs  Lab 05/13/21 0425 05/14/21 0150 05/15/21 0142  AST 24   24 21 29   ALT 38   42 37 43  ALKPHOS 60   61 56 57  BILITOT 0.4   0.5 0.4 0.4  PROT 6.5   6.5 6.3* 6.0*  ALBUMIN 2.5*   2.6* 2.4* 2.3*    No results for input(s): LIPASE, AMYLASE in the last 168 hours. No results for input(s): AMMONIA in the last 168 hours.  Coagulation Profile: No results for input(s): INR, PROTIME in the last 168 hours.  Cardiac Enzymes: No results for input(s): CKTOTAL, CKMB, CKMBINDEX, TROPONINI in the last 168 hours.  BNP (last 3 results) No results for input(s): PROBNP in the last 8760 hours.  Lipid Profile: No results for input(s): CHOL, HDL, LDLCALC, TRIG, CHOLHDL, LDLDIRECT in the last 72 hours.  Thyroid Function Tests: No results for input(s): TSH, T4TOTAL, FREET4, T3FREE, THYROIDAB in the last 72 hours.  Anemia Panel: Recent Labs    05/13/21 0425  FERRITIN 1,136*     Urine analysis:    Component Value Date/Time   COLORURINE YELLOW 05/12/2021 1014   APPEARANCEUR HAZY (A) 05/12/2021 1014   LABSPEC 1.018 05/12/2021 1014   PHURINE 5.0 05/12/2021 1014   GLUCOSEU 150 (A) 05/12/2021 1014   GLUCOSEU >=1000 (A) 10/23/2020 0937   HGBUR NEGATIVE 05/12/2021 1014   BILIRUBINUR NEGATIVE 05/12/2021 1014   KETONESUR NEGATIVE 05/12/2021 1014    PROTEINUR 30 (A) 05/12/2021 1014   UROBILINOGEN 0.2 10/23/2020 0937   NITRITE NEGATIVE 05/12/2021 1014   LEUKOCYTESUR NEGATIVE 05/12/2021 1014    Sepsis Labs: Lactic Acid, Venous No results found for: LATICACIDVEN  MICROBIOLOGY: Recent Results (from the past 240 hour(s))  Resp Panel by RT-PCR (Flu A&B, Covid) Nasopharyngeal Swab     Status: Abnormal   Collection Time: 05/12/21 10:31 AM   Specimen: Nasopharyngeal Swab; Nasopharyngeal(NP) swabs in vial transport medium  Result Value Ref Range Status   SARS Coronavirus 2 by RT PCR POSITIVE (A) NEGATIVE Final    Comment: RESULT CALLED TO, READ BACK BY AND VERIFIED WITH: RN C CHAPMAN [id number] AT 2585 BY CM (NOTE) SARS-CoV-2 target nucleic acids are DETECTED.  The SARS-CoV-2 RNA is generally detectable in upper respiratory specimens during the acute phase of infection. Positive results are indicative of the presence of the identified virus, but do not rule out bacterial infection or co-infection with other pathogens not detected by the test. Clinical correlation with patient history and other diagnostic information is necessary to determine patient infection status. The expected result is Negative.  Fact Sheet for Patients: EntrepreneurPulse.com.au  Fact Sheet for Healthcare Providers: IncredibleEmployment.be  This test is not yet approved or cleared by the [place] FDA and  has been authorized for detection and/or diagnosis of SARS-CoV-2 by FDA under an Emergency Use Authorization (EUA).  This EUA will remain in effect (meaning this test can be  used) for the duration of  the COVID-19 declaration under Section 564(b)(1) of the Act, 21 U.S.C. section 360bbb-3(b)(1), unless the authorization is terminated or revoked sooner.     Influenza A by PCR NEGATIVE NEGATIVE Final   Influenza B by PCR NEGATIVE NEGATIVE Final    Comment: (NOTE) The Xpert Xpress SARS-CoV-2/FLU/RSV plus assay is  intended as an aid in the diagnosis of influenza from Nasopharyngeal swab specimens and should not be used as a sole  basis for treatment. Nasal washings and aspirates are unacceptable for Xpert Xpress SARS-CoV-2/FLU/RSV testing.  Fact Sheet for Patients: EntrepreneurPulse.com.au  Fact Sheet for Healthcare Providers: IncredibleEmployment.be  This test is not yet approved or cleared by the [place] FDA and has been authorized for detection and/or diagnosis of SARS-CoV-2 by FDA under an Emergency Use Authorization (EUA). This EUA will remain in effect (meaning this test can be used) for the duration of the COVID-19 declaration under Section 564(b)(1) of the Act, 21 U.S.C. section 360bbb-3(b)(1), unless the authorization is terminated or revoked.  Performed at [facility], [place] [address] [place], [place] [address]     RADIOLOGY STUDIES/RESULTS: No results found.   LOS: 3 days   Oren Binet, MD  [facility]    To contact the attending provider between 7A-7P or the covering provider during after hours 7P-7A, please log into the web site [email or web address] and access using universal [place] password for that web site. If you do not have the password, please call the hospital operator.  05/15/2021, 9:47 AM

## 2021-05-15 NOTE — Plan of Care (Signed)

## 2021-05-15 NOTE — Progress Notes (Signed)
Patient on 3L nasal cannula this morning, decreased to 1L by Dr. Sloan Leiter. Patient unable to maintain sats and sats decreased to 86-87%, this nurse increased O2 to 2L and sats increased to 94%. Patient has no c/o SOB at this time.

## 2021-05-15 NOTE — Progress Notes (Signed)
Bilateral lower extremity venous duplex has been completed. Preliminary results can be found in CV Proc through chart review.   05/15/21 1:02 PM Carlos Levering RVT

## 2021-05-15 NOTE — TOC Initial Note (Signed)
Transition of Care [facility]) - Initial/Assessment Note    Patient Details  Name: Joseph Villa MRN: [id number] Date of Birth: August 24, 1940  [facility] CM/SW Contact:    Carles Collet, RN Phone Number: 05/15/2021, 1:00 PM  Clinical Narrative:         Could not reach patient, spoke w wife over the phone. Patient will likely need oxygen to go home with, wife will provide transport home. Wife states that he has CPAP with [employer], will order oxygen through [facility]. Spoke w liaison Caryl Pina who will follow for order and note.  [facility] will deliver transport tank to the hospital today. Requested RN to document insurance compliant amb sat note, notified MD of need for O2 order. No HH needs at this time            Expected Discharge Plan: Home/Self Care Barriers to Discharge: Continued Medical Work up   Patient Goals and CMS Choice Patient states their goals for this hospitalization and ongoing recovery are:: return home   Choice offered to / list presented to : Spouse  Expected Discharge Plan and Services Expected Discharge Plan: Home/Self Care   Discharge Planning Services: CM Consult Post Acute Care Choice: Durable Medical Equipment Living arrangements for the past 2 months: Single Family Home                 DME Arranged: Oxygen DME Agency: [employer] Date DME Agency Contacted: 05/15/21 Time DME Agency Contacted: 63 Representative spoke with at DME Agency: Caryl Pina            Prior Living Arrangements/Services Living arrangements for the past 2 months: [place] Lives with:: Spouse                   Activities of Daily Living Home Assistive Devices/Equipment: None ADL Screening (condition at time of admission) Patient's cognitive ability adequate to safely complete daily activities?: No Is the patient deaf or have difficulty hearing?: No Does the patient have difficulty seeing, even when wearing glasses/contacts?: No Does the patient have  difficulty concentrating, remembering, or making decisions?: No Patient able to express need for assistance with ADLs?: Yes Does the patient have difficulty dressing or bathing?: Yes Independently performs ADLs?: No Communication: Needs assistance (due to weakness) Does the patient have difficulty walking or climbing stairs?: Yes Weakness of Legs: Both Weakness of Arms/Hands: Both  Permission Sought/Granted                  Emotional Assessment              Admission diagnosis:  Acute respiratory failure with hypoxia ([place]) [J96.01] AKI (acute kidney injury) ([place]) [N17.9] COVID [U07.1] Patient Active Problem List   Diagnosis Date Noted   Acute respiratory failure with hypoxia ([place]) 05/12/2021   COVID-19 virus infection 05/12/2021   AKI (acute kidney injury) ([place]) 87/56/4332   Metabolic acidosis 95/18/8416   Left carotid artery stenosis 11/20/2020   Amaurosis fugax of left eye 11/08/2020   Hyperlipidemia associated with type 2 diabetes mellitus ([place]) 11/08/2020   Statin myopathy 05/22/2020   Eczema 11/13/2018   Rash 11/13/2018   Great toe pain, right 11/01/2017   Paronychia of toenail 10/18/2017   Chronic low back pain 05/02/2017   Chest pain 10/23/2016   CKD (chronic kidney disease), stage III ([place]) 04/19/2016   BPH (benign prostatic hypertrophy) with urinary obstruction 10/20/2015   Type 2 diabetes mellitus ([place]) 10/07/2015   Impingement syndrome of left shoulder 02/10/2015  Cough 07/09/2013   Positional vertigo 06/30/2012   Expected blood loss anemia 06/17/2012   Obese 06/17/2012   S/P left THA, AA 06/16/2012   Preop exam for internal medicine 06/04/2012   Peripheral neuropathy 12/21/2011   Anxiety 12/20/2011   Left hip pain 04/18/2011   Degenerative arthritis of hip 04/18/2011   Arthritis, lumbar spine 04/18/2011   Hemorrhoids, external 04/18/2011   Encounter for well adult exam with abnormal findings 12/23/2010   Hypothyroidism 10/06/2009    Hyperlipidemia 10/06/2009   BIPOLAR DISORDER UNSPECIFIED 10/06/2009   Hypertension associated with diabetes ([place]) 10/06/2009   GERD 10/06/2009   COLONIC POLYPS, HX OF 10/06/2009   NEPHROLITHIASIS, HX OF 10/06/2009   Obstructive sleep apnea 11/25/2007   Seasonal and perennial allergic rhinitis 11/25/2007   Allergic-infective asthma 11/25/2007   PCP:  Biagio Borg, MD Pharmacy:   [facility], [place] - [address] [place] [address] Phone: [phone] Fax: [phone]     Social Determinants of Health (SDOH) Interventions    Readmission Risk Interventions No flowsheet data found.

## 2021-05-15 NOTE — Progress Notes (Signed)
Pt refused CPAP for tonight.  

## 2021-05-15 NOTE — Progress Notes (Signed)
O2 tank delivered to patient room.

## 2021-05-15 NOTE — Progress Notes (Signed)
OT Cancellation Note  Patient Details Name: MACEO HERNAN MRN: [id number] DOB: [birth year]   Cancelled Treatment:    Reason Eval/Treat Not Completed: Per RN patient recently received lunch tray. OT to check back as time allows.   Gloris Manchester OTR/L Supplemental OT, Department of rehab services [phone]   Kazumi Lachney R H. 05/15/2021, 1:56 PM

## 2021-05-15 NOTE — Progress Notes (Signed)
SATURATION QUALIFICATIONS: (This note is used to comply with regulatory documentation for home oxygen)  Patient Saturations on Room Air at Rest = 89%  Patient Saturations on Room Air while Ambulating = 86%  Patient Saturations on 2 Liters of oxygen while Ambulating = 92%  Please briefly explain why patient needs home oxygen: SOB on exertion

## 2021-05-15 NOTE — Progress Notes (Signed)
This nurse made multiple attempts this shift to encourage OOB movement and encouraged patient to sit up in the chair. Patient ambulated long enough to measure need for O2 but refused further ambulation stating "my lungs aren't worth a damn." This nurse attempted to explain the benefits of ambulation and getting OOB but patient adamantly refused. Daughter and granddaughter at bedside.

## 2021-05-16 ENCOUNTER — Inpatient Hospital Stay (HOSPITAL_COMMUNITY): Payer: Medicare Other

## 2021-05-16 DIAGNOSIS — R0609 Other forms of dyspnea: Secondary | ICD-10-CM

## 2021-05-16 LAB — CBC WITH DIFFERENTIAL/PLATELET
Abs Immature Granulocytes: 0.31 10*3/uL — ABNORMAL HIGH (ref 0.00–0.07)
Basophils Absolute: 0 10*3/uL (ref 0.0–0.1)
Basophils Relative: 0 %
Eosinophils Absolute: 0.2 10*3/uL (ref 0.0–0.5)
Eosinophils Relative: 2 %
HCT: 30.7 % — ABNORMAL LOW (ref 39.0–52.0)
Hemoglobin: 9.6 g/dL — ABNORMAL LOW (ref 13.0–17.0)
Immature Granulocytes: 4 %
Lymphocytes Relative: 12 %
Lymphs Abs: 1 10*3/uL (ref 0.7–4.0)
MCH: 29.4 pg (ref 26.0–34.0)
MCHC: 31.3 g/dL (ref 30.0–36.0)
MCV: 94.2 fL (ref 80.0–100.0)
Monocytes Absolute: 0.3 10*3/uL (ref 0.1–1.0)
Monocytes Relative: 4 %
Neutro Abs: 6.5 10*3/uL (ref 1.7–7.7)
Neutrophils Relative %: 78 %
Platelets: 337 10*3/uL (ref 150–400)
RBC: 3.26 MIL/uL — ABNORMAL LOW (ref 4.22–5.81)
RDW: 13.5 % (ref 11.5–15.5)
WBC: 8.3 10*3/uL (ref 4.0–10.5)
nRBC: 0 % (ref 0.0–0.2)

## 2021-05-16 LAB — ECHOCARDIOGRAM COMPLETE
AR max vel: 2.29 cm2
AV Area VTI: 2.29 cm2
AV Area mean vel: 2.11 cm2
AV Mean grad: 2 mmHg
AV Peak grad: 4.2 mmHg
Ao pk vel: 1.03 m/s
Area-P 1/2: 4.86 cm2
Height: 71.5 in
S' Lateral: 2.8 cm
Weight: 4000 oz

## 2021-05-16 LAB — GLUCOSE, CAPILLARY
Glucose-Capillary: 140 mg/dL — ABNORMAL HIGH (ref 70–99)
Glucose-Capillary: 211 mg/dL — ABNORMAL HIGH (ref 70–99)
Glucose-Capillary: 175 mg/dL — ABNORMAL HIGH (ref 70–99)
Glucose-Capillary: 195 mg/dL — ABNORMAL HIGH (ref 70–99)

## 2021-05-16 LAB — COMPREHENSIVE METABOLIC PANEL
ALT: 48 U/L — ABNORMAL HIGH (ref 0–44)
AST: 31 U/L (ref 15–41)
Albumin: 2.3 g/dL — ABNORMAL LOW (ref 3.5–5.0)
Alkaline Phosphatase: 56 U/L (ref 38–126)
Anion gap: 7 (ref 5–15)
BUN: 78 mg/dL — ABNORMAL HIGH (ref 8–23)
CO2: 23 mmol/L (ref 22–32)
Calcium: 8.8 mg/dL — ABNORMAL LOW (ref 8.9–10.3)
Chloride: 108 mmol/L (ref 98–111)
Creatinine, Ser: 2.57 mg/dL — ABNORMAL HIGH (ref 0.61–1.24)
GFR, Estimated: 25 mL/min — ABNORMAL LOW (ref >60)
Glucose, Bld: 175 mg/dL — ABNORMAL HIGH (ref 70–99)
Potassium: 5.8 mmol/L — ABNORMAL HIGH (ref 3.5–5.1)
Sodium: 138 mmol/L (ref 135–145)
Total Bilirubin: 0.8 mg/dL (ref 0.3–1.2)
Total Protein: 6.3 g/dL — ABNORMAL LOW (ref 6.5–8.1)

## 2021-05-16 LAB — C-REACTIVE PROTEIN: CRP: 20.4 mg/dL — ABNORMAL HIGH (ref <1.0)

## 2021-05-16 LAB — D-DIMER, QUANTITATIVE: D-Dimer, Quant: 1.04 ug/mL-FEU — ABNORMAL HIGH (ref 0.00–0.50)

## 2021-05-16 LAB — MAGNESIUM: Magnesium: 2.7 mg/dL — ABNORMAL HIGH (ref 1.7–2.4)

## 2021-05-16 MED ORDER — SODIUM ZIRCONIUM CYCLOSILICATE 10 G PO PACK
10.0000 g | PACK | Freq: Three times a day (TID) | ORAL | Status: AC
  Administered 2021-05-16 (×3): 10 g via ORAL
  Filled 2021-05-16 (×3): qty 1

## 2021-05-16 NOTE — Progress Notes (Signed)
SATURATION QUALIFICATIONS: (This note is used to comply with regulatory documentation for home oxygen)  Patient Saturations on Room Air at Rest = 90%  Patient Saturations on Room Air while Ambulating = 84%  Patient Saturations on 4 Liters of oxygen while Ambulating = 88%  Please briefly explain why patient needs home oxygen:  To  maintain sats >87% during functional activities.    Arby Barrette, PT Acute Rehabilitation Services  Pager [phone] Office [phone]

## 2021-05-16 NOTE — Progress Notes (Signed)
PROGRESS NOTE        PATIENT DETAILS Name: Joseph Villa Age: 80 y.o. Sex: male Date of Birth: Dec 01, 1940 Admit Date: 05/12/2021 Admitting Physician Delfino Lovett Cyndra Numbers, DO NID:[id number], Hunt Oris, MD  Brief Narrative: Patient is a 80 y.o. male with history of GERD, HTN, hypothyroidism, DM-2, CKD stage IIIb-who has been having cough-for approximately around 8 days prior to this hospitalization-subsequently diagnosed with COVID-19 around 4-5 days prior to this hospitalization-presenting with worsening cough/shortness of breath and fever-found to have acute hypoxic respiratory failure due to COVID-19 pneumonia.  See below for further details.    Subjective: Much better-still with some cough.  Objective: Vitals: Blood pressure (!) 152/71, pulse 67, temperature 97.6 F (36.4 C), temperature source Oral, resp. rate 19, height 5' 11.5" (1.816 m), weight 113.4 kg, SpO2 93 %.   Exam: Gen Exam:Alert awake-not in any distress HEENT:atraumatic, normocephalic Chest: B/L clear to auscultation anteriorly CVS:S1S2 regular Abdomen:soft non tender, non distended Extremities:no edema Neurology: Non focal Skin: no rash   Pertinent Labs/Radiology: Recent Labs  Lab 05/13/21 0425 05/14/21 0150 05/15/21 0142 05/16/21 0045  WBC 13.7* 13.6* 16.6* 8.3  HGB 10.1* 9.6* 9.2* 9.6*  PLT 221 257 281 337  NA 132*   131* 134* 133* 138  K 4.6   4.6 5.1 5.1 5.8*  CREATININE 2.79*   2.81* 3.02* 2.99* 2.57*  AST 24   24 21 29 31   ALT 38   42 37 43 48*  ALKPHOS 60   61 56 57 56  BILITOT 0.4   0.5 0.4 0.4 0.8    Assessment/Plan: Acute hypoxic respiratory failure due to COVID-19 pneumonitis: Slowly improving-still requiring oxygen-around 4 L with ambulation.  CRP continues to trend down-no longer levofloxacin.  May need to assess assess for home O2 requirement prior to discharge.  Recent Labs    05/14/21 0150 05/15/21 0142 05/16/21 0045  DDIMER 3.15* 1.72* 1.04*  CRP 44.1*  27.1* 20.4*    AKI on CKD stage IIIb: AKI likely hemodynamically mediated-renal ultrasound without hydronephrosis-UA with minimal proteinuria-creatinine plateaued and is now downtrending-continue to avoid nephrotoxic agents.  Continue to follow electrolytes periodically.    Lab Results  Component Value Date   CREATININE 2.57 (H) 05/16/2021   CREATININE 2.99 (H) 05/15/2021   CREATININE 3.02 (H) 05/14/2021   Hyperkalemia: 2 doses of Lokelma today-recheck tomorrow.  Elevated D-dimer: Likely due to COVID-19 infection-has mild hypoxemia-lower extremity Dopplers negative-continue prophylactic heparin.    History of CVA-bilateral carotid artery stenosis-s/p left TCAR 2022: Continue aspirin/Plavix-nonfocal exam.  DM-2 (A1c 6.4 on 10/22) with uncontrolled hyperglycemia due to steroids: CBGs remain elevated-continue Semglee 25 units, 10 units of NovoLog with meals-SSI-follow and readjust   Recent Labs    05/15/21 2056 05/16/21 0727 05/16/21 1214  GLUCAP 143* 140* 211*     Hypothyroidism: Continue with levothyroxine  MPN:TIRWERXV Flomax-follow bladder scans  Mood disorder: Continue Tegretol.  Obesity Estimated body mass index is 34.38 kg/m as calculated from the following:   Height as of this encounter: 5' 11.5" (1.816 m).   Weight as of this encounter: 113.4 kg.   Procedures: None Consults: None DVT Prophylaxis: Heparin Code Status:Full code Family Communication: Spouse-Carol-[phone] updated over the phone on 12/13.  Time spent: 25 minutes-Greater than 50% of this time was spent in counseling, explanation of diagnosis, planning of further management, and coordination of care.  Disposition Plan: Status is: Inpatient  Remains inpatient appropriate because: Hypoxia due to likely COVID-19 pneumonia/superimposed bacterial pneumonia.    Diet: Diet Order             Diet Carb Modified Fluid consistency: Thin; Room service appropriate? No  Diet effective now                      Antimicrobial agents: Anti-infectives (From admission, onward)    Start     Dose/Rate Route Frequency Ordered Stop   05/14/21 1000  levofloxacin (LEVAQUIN) tablet 250 mg       See Hyperspace for full Linked Orders Report.   250 mg Oral Daily 05/13/21 1210 05/15/21 0929   05/13/21 1300  levofloxacin (LEVAQUIN) tablet 500 mg       See Hyperspace for full Linked Orders Report.   500 mg Oral Daily 05/13/21 1210 05/13/21 1304   05/13/21 1230  azithromycin (ZITHROMAX) tablet 500 mg  Status:  Discontinued        500 mg Oral Daily 05/13/21 1141 05/13/21 1207        MEDICATIONS: Scheduled Meds:  albuterol  2 puff Inhalation BID   aspirin  81 mg Oral Daily   benzonatate  200 mg Oral TID   bromocriptine  1.25 mg Oral Daily   carbamazepine  400 mg Oral QHS   cholecalciferol  2,000 Units Oral q morning   clopidogrel  75 mg Oral Daily   guaiFENesin  600 mg Oral BID   heparin  5,000 Units Subcutaneous Q8H   insulin aspart  0-20 Units Subcutaneous TID WC   insulin aspart  10 Units Subcutaneous TID WC   insulin glargine-yfgn  25 Units Subcutaneous Daily   levothyroxine  112 mcg Oral Q0600   methylPREDNISolone (SOLU-MEDROL) injection  60 mg Intravenous Q12H   pantoprazole  40 mg Oral Daily   sodium zirconium cyclosilicate  10 g Oral TID   tamsulosin  0.4 mg Oral Daily   Continuous Infusions:  PRN Meds:.acetaminophen, albuterol, fluticasone, ondansetron **OR** ondansetron (ZOFRAN) IV   I have personally reviewed following labs and imaging studies  LABORATORY DATA: CBC: Recent Labs  Lab 05/12/21 0904 05/12/21 1459 05/13/21 0425 05/14/21 0150 05/15/21 0142 05/16/21 0045  WBC 15.7*  --  13.7* 13.6* 16.6* 8.3  NEUTROABS 12.8*  --  11.9* 12.2* 14.9* 6.5  HGB 10.8* 11.6* 10.1* 9.6* 9.2* 9.6*  HCT 33.7* 34.0* 30.7* 30.0* 28.6* 30.7*  MCV 93.4  --  91.6 92.9 92.6 94.2  PLT 217  --  221 257 281 337     Basic Metabolic Panel: Recent Labs  Lab 05/12/21 0904  05/12/21 1459 05/13/21 0425 05/14/21 0150 05/15/21 0142 05/16/21 0045  NA 133* 137 132*   131* 134* 133* 138  K 4.4 4.6 4.6   4.6 5.1 5.1 5.8*  CL 106  --  104   104 104 105 108  CO2 16*  --  18*   18* 19* 18* 23  GLUCOSE 230*  --  267*   267* 260* 216* 175*  BUN 65*  --  60*   60* 76* 86* 78*  CREATININE 3.02*  --  2.79*   2.81* 3.02* 2.99* 2.57*  CALCIUM 8.4*  --  8.1*   8.1* 8.4* 8.3* 8.8*  MG  --   --  2.2   2.2 2.4 2.5* 2.7*  PHOS  --   --  2.2*   2.2*  --  4.1  --  GFR: Estimated Creatinine Clearance: 30.6 mL/min (A) (by C-G formula based on SCr of 2.57 mg/dL (H)).  Liver Function Tests: Recent Labs  Lab 05/13/21 0425 05/14/21 0150 05/15/21 0142 05/16/21 0045  AST 24   24 21 29 31   ALT 38   42 37 43 48*  ALKPHOS 60   61 56 57 56  BILITOT 0.4   0.5 0.4 0.4 0.8  PROT 6.5   6.5 6.3* 6.0* 6.3*  ALBUMIN 2.5*   2.6* 2.4* 2.3* 2.3*    No results for input(s): LIPASE, AMYLASE in the last 168 hours. No results for input(s): AMMONIA in the last 168 hours.  Coagulation Profile: No results for input(s): INR, PROTIME in the last 168 hours.  Cardiac Enzymes: No results for input(s): CKTOTAL, CKMB, CKMBINDEX, TROPONINI in the last 168 hours.  BNP (last 3 results) No results for input(s): PROBNP in the last 8760 hours.  Lipid Profile: No results for input(s): CHOL, HDL, LDLCALC, TRIG, CHOLHDL, LDLDIRECT in the last 72 hours.  Thyroid Function Tests: No results for input(s): TSH, T4TOTAL, FREET4, T3FREE, THYROIDAB in the last 72 hours.  Anemia Panel: No results for input(s): VITAMINB12, FOLATE, FERRITIN, TIBC, IRON, RETICCTPCT in the last 72 hours.   Urine analysis:    Component Value Date/Time   COLORURINE YELLOW 05/12/2021 1014   APPEARANCEUR HAZY (A) 05/12/2021 1014   LABSPEC 1.018 05/12/2021 1014   PHURINE 5.0 05/12/2021 1014   GLUCOSEU 150 (A) 05/12/2021 1014   GLUCOSEU >=1000 (A) 10/23/2020 0937   HGBUR NEGATIVE 05/12/2021 1014   BILIRUBINUR  NEGATIVE 05/12/2021 1014   KETONESUR NEGATIVE 05/12/2021 1014   PROTEINUR 30 (A) 05/12/2021 1014   UROBILINOGEN 0.2 10/23/2020 0937   NITRITE NEGATIVE 05/12/2021 1014   LEUKOCYTESUR NEGATIVE 05/12/2021 1014    Sepsis Labs: Lactic Acid, Venous No results found for: LATICACIDVEN  MICROBIOLOGY: Recent Results (from the past 240 hour(s))  Resp Panel by RT-PCR (Flu A&B, Covid) Nasopharyngeal Swab     Status: Abnormal   Collection Time: 05/12/21 10:31 AM   Specimen: Nasopharyngeal Swab; Nasopharyngeal(NP) swabs in vial transport medium  Result Value Ref Range Status   SARS Coronavirus 2 by RT PCR POSITIVE (A) NEGATIVE Final    Comment: RESULT CALLED TO, READ BACK BY AND VERIFIED WITH: RN C CHAPMAN [id number] AT 2703 BY CM (NOTE) SARS-CoV-2 target nucleic acids are DETECTED.  The SARS-CoV-2 RNA is generally detectable in upper respiratory specimens during the acute phase of infection. Positive results are indicative of the presence of the identified virus, but do not rule out bacterial infection or co-infection with other pathogens not detected by the test. Clinical correlation with patient history and other diagnostic information is necessary to determine patient infection status. The expected result is Negative.  Fact Sheet for Patients: EntrepreneurPulse.com.au  Fact Sheet for Healthcare Providers: IncredibleEmployment.be  This test is not yet approved or cleared by the [place] FDA and  has been authorized for detection and/or diagnosis of SARS-CoV-2 by FDA under an Emergency Use Authorization (EUA).  This EUA will remain in effect (meaning this test can be  used) for the duration of  the COVID-19 declaration under Section 564(b)(1) of the Act, 21 U.S.C. section 360bbb-3(b)(1), unless the authorization is terminated or revoked sooner.     Influenza A by PCR NEGATIVE NEGATIVE Final   Influenza B by PCR NEGATIVE NEGATIVE Final     Comment: (NOTE) The Xpert Xpress SARS-CoV-2/FLU/RSV plus assay is intended as an aid in the diagnosis of influenza from  Nasopharyngeal swab specimens and should not be used as a sole basis for treatment. Nasal washings and aspirates are unacceptable for Xpert Xpress SARS-CoV-2/FLU/RSV testing.  Fact Sheet for Patients: EntrepreneurPulse.com.au  Fact Sheet for Healthcare Providers: IncredibleEmployment.be  This test is not yet approved or cleared by the [place] FDA and has been authorized for detection and/or diagnosis of SARS-CoV-2 by FDA under an Emergency Use Authorization (EUA). This EUA will remain in effect (meaning this test can be used) for the duration of the COVID-19 declaration under Section 564(b)(1) of the Act, 21 U.S.C. section 360bbb-3(b)(1), unless the authorization is terminated or revoked.  Performed at [facility], [place] [address] [place], [place] [address]     RADIOLOGY STUDIES/RESULTS: VAS [place] LOWER EXTREMITY VENOUS (DVT)  Result Date: 05/15/2021  Lower Venous DVT Study Patient Name:  Joseph Villa  Date of Exam:   05/15/2021 Medical Rec #: [id number]         Accession #:    [id number] Date of Birth: [birth year]         Patient Gender: M Patient Age:   65 years Exam Location:  [facility] Procedure:      VAS [place] LOWER EXTREMITY VENOUS (DVT) Referring Phys: Oren Binet --------------------------------------------------------------------------------  Indications: Swelling.  Risk Factors: COVID 19 positive. Comparison Study: No prior studies. Performing Technologist: Oliver Hum RVT  Examination Guidelines: A complete evaluation includes B-mode imaging, spectral Doppler, color Doppler, and power Doppler as needed of all accessible portions of each vessel. Bilateral testing is considered an integral part of a complete examination. Limited examinations for reoccurring indications may be performed as noted. The  reflux portion of the exam is performed with the patient in reverse Trendelenburg.  +---------+---------------+---------+-----------+----------+--------------+  RIGHT     Compressibility Phasicity Spontaneity Properties Thrombus Aging  +---------+---------------+---------+-----------+----------+--------------+  CFV       Full            Yes       Yes                                    +---------+---------------+---------+-----------+----------+--------------+  SFJ       Full                                                             +---------+---------------+---------+-----------+----------+--------------+  FV Prox   Full                                                             +---------+---------------+---------+-----------+----------+--------------+  FV Mid    Full                                                             +---------+---------------+---------+-----------+----------+--------------+  FV Distal Full                                                             +---------+---------------+---------+-----------+----------+--------------+  PFV       Full                                                             +---------+---------------+---------+-----------+----------+--------------+  POP       Full            Yes       Yes                                    +---------+---------------+---------+-----------+----------+--------------+  PTV       Full                                                             +---------+---------------+---------+-----------+----------+--------------+  PERO      Full                                                             +---------+---------------+---------+-----------+----------+--------------+   +---------+---------------+---------+-----------+----------+--------------+  LEFT      Compressibility Phasicity Spontaneity Properties Thrombus Aging  +---------+---------------+---------+-----------+----------+--------------+  CFV       Full            Yes        Yes                                    +---------+---------------+---------+-----------+----------+--------------+  SFJ       Full                                                             +---------+---------------+---------+-----------+----------+--------------+  FV Prox   Full                                                             +---------+---------------+---------+-----------+----------+--------------+  FV Mid    Full                                                             +---------+---------------+---------+-----------+----------+--------------+  FV Distal Full                                                             +---------+---------------+---------+-----------+----------+--------------+  PFV       Full                                                             +---------+---------------+---------+-----------+----------+--------------+  POP       Full            Yes       Yes                                    +---------+---------------+---------+-----------+----------+--------------+  PTV       Full                                                             +---------+---------------+---------+-----------+----------+--------------+  PERO      Full                                                             +---------+---------------+---------+-----------+----------+--------------+     Summary: RIGHT: - There is no evidence of deep vein thrombosis in the lower extremity.  - No cystic structure found in the popliteal fossa.  LEFT: - There is no evidence of deep vein thrombosis in the lower extremity.  - No cystic structure found in the popliteal fossa.  *See table(s) above for measurements and observations. Electronically signed by Deitra Mayo MD on 05/15/2021 at 1:53:51 PM.    Final      LOS: 4 days   Oren Binet, MD  [facility]    To contact the attending provider between 7A-7P or the covering provider during after hours 7P-7A, please log into the web site  [email or web address] and access using universal [place] password for that web site. If you do not have the password, please call the hospital operator.  05/16/2021, 1:40 PM

## 2021-05-16 NOTE — Progress Notes (Signed)
Physical Therapy Treatment Patient Details Name: Joseph Villa MRN: [id number] DOB: 1940-10-04 Today's Date: 05/16/2021   History of Present Illness Joseph Villa is a 80 y.o. male who presented to the ER on 05/12/2021 with with dyspnea, hypoxia, fever. Tested positive for COVID ~1 week PTA but became more weak and fatigued.   PMH of GERD, hypothyroidism, hypertension, OSA, diabetes, CKD    PT Comments    On arrival, pt on room air sitting at EOB finishing his bath with NT with sats 84%. Tech reported pt had "been fine" on room air at rest, but dropped sats with activity. Returned pt to 2L [place] O2 and educated in pursed lip breathing (which pt has difficulty doing) and sats remained at best 87% while seated (x 5 minutes). Increased to 4L with sats incr to 89% and able to ambulate on 4L with sats 87-88% x 150 ft. See below for education on positioning and use of IS.     Recommendations for follow up therapy are one component of a multi-disciplinary discharge planning process, led by the attending physician.  Recommendations may be updated based on patient status, additional functional criteria and insurance authorization.  Follow Up Recommendations  No PT follow up     Assistance Recommended at Discharge Set up Supervision/Assistance  Equipment Recommendations  None recommended by PT    Recommendations for Other Services       Precautions / Restrictions Precautions Precautions: Fall Precaution Comments: watch O2     Mobility  Bed Mobility Overal bed mobility: Independent                  Transfers Overall transfer level: Needs assistance Equipment used: None Transfers: Sit to/from Stand Sit to Stand: Supervision                Ambulation/Gait Ambulation/Gait assistance: Min guard Gait Distance (Feet): 150 Feet (laps in room) Assistive device: None Gait Pattern/deviations: Step-through pattern;Wide base of support       General Gait Details: no  staggering noted; sats 88% on 4L   Stairs             Wheelchair Mobility    Modified Rankin (Stroke Patients Only)       Balance Overall balance assessment: Needs assistance Sitting-balance support: No upper extremity supported;Feet unsupported Sitting balance-Leahy Scale: Good     Standing balance support: No upper extremity supported;During functional activity Standing balance-Leahy Scale: Fair                              Cognition Arousal/Alertness: Awake/alert Behavior During Therapy: WFL for tasks assessed/performed Overall Cognitive Status: Within Functional Limits for tasks assessed                                          Exercises Other Exercises Other Exercises: Instructed in use of IS. Repeated x 10 breaths with highest 761ml. Educated that IS should make him cough to help clear his lungs (he doesn't like to use it due to causing coughing).    General Comments General comments (skin integrity, edema, etc.): pt refusing to sit in recliner due to severe back pain the last time he sat there. Attempted chair position in bed with pillow at his low back and pt could not tolerate due to back spasms. Noted pt splinting and holding  his breath due to pain and reclined bed to find position of comfort with pt ultimately at [facility] 20 with knees elevated. While upright, sats 86-87% on 2L, as reclined sats incr to 90%      Pertinent Vitals/Pain Pain Assessment: Faces Faces Pain Scale: Hurts whole lot Pain Location: low back Pain Descriptors / Indicators: Constant;Discomfort;Grimacing Pain Intervention(s): Limited activity within patient's tolerance;Repositioned;Monitored during session;Patient requesting pain meds-RN notified    Home Living                          Prior Function            PT Goals (current goals can now be found in the care plan section) Acute Rehab PT Goals Patient Stated Goal: get strength back PT Goal  Formulation: With patient Time For Goal Achievement: 05/27/21 Potential to Achieve Goals: Good Progress towards PT goals: Progressing toward goals    Frequency    Min 3X/week      PT Plan Current plan remains appropriate    Co-evaluation              AM-PAC PT "6 Clicks" Mobility   Outcome Measure  Help needed turning from your back to your side while in a flat bed without using bedrails?: None Help needed moving from lying on your back to sitting on the side of a flat bed without using bedrails?: None Help needed moving to and from a bed to a chair (including a wheelchair)?: A Little Help needed standing up from a chair using your arms (e.g., wheelchair or bedside chair)?: A Little Help needed to walk in hospital room?: A Little Help needed climbing 3-5 steps with a railing? : A Little 6 Click Score: 20    End of Session Equipment Utilized During Treatment: Oxygen Activity Tolerance: Treatment limited secondary to medical complications (Comment) (low sats) Patient left: with call bell/phone within reach;in bed;with family/visitor present Nurse Communication: Mobility status;Other (comment) (back pain with poor tolerance of upright; encourage more walking rather than sitting in chair) PT Visit Diagnosis: Unsteadiness on feet (R26.81)     Time: 2549-8264 PT Time Calculation (min) (ACUTE ONLY): 34 min  Charges:  $Gait Training: 8-22 mins $Therapeutic Activity: 8-22 mins                      Arby Barrette, PT Acute Rehabilitation Services  Pager [phone] Office [phone]    Rexanne Mano 05/16/2021, 11:03 AM

## 2021-05-16 NOTE — Plan of Care (Signed)

## 2021-05-17 ENCOUNTER — Inpatient Hospital Stay (HOSPITAL_COMMUNITY): Payer: Medicare Other

## 2021-05-17 LAB — MAGNESIUM: Magnesium: 2.4 mg/dL (ref 1.7–2.4)

## 2021-05-17 LAB — GLUCOSE, CAPILLARY
Glucose-Capillary: 134 mg/dL — ABNORMAL HIGH (ref 70–99)
Glucose-Capillary: 121 mg/dL — ABNORMAL HIGH (ref 70–99)
Glucose-Capillary: 180 mg/dL — ABNORMAL HIGH (ref 70–99)
Glucose-Capillary: 191 mg/dL — ABNORMAL HIGH (ref 70–99)

## 2021-05-17 LAB — COMPREHENSIVE METABOLIC PANEL
ALT: 48 U/L — ABNORMAL HIGH (ref 0–44)
AST: 28 U/L (ref 15–41)
Albumin: 2.4 g/dL — ABNORMAL LOW (ref 3.5–5.0)
Alkaline Phosphatase: 60 U/L (ref 38–126)
Anion gap: 8 (ref 5–15)
BUN: 69 mg/dL — ABNORMAL HIGH (ref 8–23)
CO2: 20 mmol/L — ABNORMAL LOW (ref 22–32)
Calcium: 8.8 mg/dL — ABNORMAL LOW (ref 8.9–10.3)
Chloride: 110 mmol/L (ref 98–111)
Creatinine, Ser: 2.27 mg/dL — ABNORMAL HIGH (ref 0.61–1.24)
GFR, Estimated: 29 mL/min — ABNORMAL LOW (ref >60)
Glucose, Bld: 211 mg/dL — ABNORMAL HIGH (ref 70–99)
Potassium: 4.7 mmol/L (ref 3.5–5.1)
Sodium: 138 mmol/L (ref 135–145)
Total Bilirubin: 0.4 mg/dL (ref 0.3–1.2)
Total Protein: 6.3 g/dL — ABNORMAL LOW (ref 6.5–8.1)

## 2021-05-17 LAB — CBC WITH DIFFERENTIAL/PLATELET
Abs Immature Granulocytes: 0 10*3/uL (ref 0.00–0.07)
Basophils Absolute: 0 10*3/uL (ref 0.0–0.1)
Basophils Relative: 0 %
Eosinophils Absolute: 0 10*3/uL (ref 0.0–0.5)
Eosinophils Relative: 0 %
HCT: 31.2 % — ABNORMAL LOW (ref 39.0–52.0)
Hemoglobin: 10.4 g/dL — ABNORMAL LOW (ref 13.0–17.0)
Lymphocytes Relative: 4 %
Lymphs Abs: 0.4 10*3/uL — ABNORMAL LOW (ref 0.7–4.0)
MCH: 30.7 pg (ref 26.0–34.0)
MCHC: 33.3 g/dL (ref 30.0–36.0)
MCV: 92 fL (ref 80.0–100.0)
Monocytes Absolute: 0 10*3/uL — ABNORMAL LOW (ref 0.1–1.0)
Monocytes Relative: 0 %
Neutro Abs: 9.8 10*3/uL — ABNORMAL HIGH (ref 1.7–7.7)
Neutrophils Relative %: 96 %
Platelets: 361 10*3/uL (ref 150–400)
RBC: 3.39 MIL/uL — ABNORMAL LOW (ref 4.22–5.81)
RDW: 13.3 % (ref 11.5–15.5)
WBC: 10.2 10*3/uL (ref 4.0–10.5)
nRBC: 0 % (ref 0.0–0.2)
nRBC: 2 /100 WBC — ABNORMAL HIGH

## 2021-05-17 LAB — D-DIMER, QUANTITATIVE: D-Dimer, Quant: 0.89 ug/mL-FEU — ABNORMAL HIGH (ref 0.00–0.50)

## 2021-05-17 LAB — C-REACTIVE PROTEIN
CRP: 11.7 mg/dL — ABNORMAL HIGH (ref <1.0)
CRP: 9.9 mg/dL — ABNORMAL HIGH (ref <1.0)

## 2021-05-17 MED ORDER — AMLODIPINE BESYLATE 5 MG PO TABS
5.0000 mg | ORAL_TABLET | Freq: Every day | ORAL | Status: DC
  Administered 2021-05-17: 5 mg via ORAL
  Filled 2021-05-17: qty 1

## 2021-05-17 NOTE — Progress Notes (Signed)
Pt refused CPAP tonight. RT will cont to monitor.

## 2021-05-17 NOTE — Progress Notes (Signed)
Occupational Therapy Treatment Patient Details Name: Joseph Villa MRN: [id number] DOB: 25-Dec-1940 Today's Date: 05/17/2021   History of present illness Joseph Villa is a 80 y.o. male who presented to the ER on 05/12/2021 with with dyspnea, hypoxia, fever. Tested positive for COVID ~1 week PTA but became more weak and fatigued.   PMH of GERD, hypothyroidism, hypertension, OSA, diabetes, CKD   OT comments  Patient received in bed and agreeable to participate in OT. Patient stated he had already completed self care and grooming today and did not need to use bathroom. Patient agreed to functional mobility in room and transfers to EOB with min guard due to O2 monitoring equipment. Patient returned to bed following treatment. Acute OT to continue to follow.    Recommendations for follow up therapy are one component of a multi-disciplinary discharge planning process, led by the attending physician.  Recommendations may be updated based on patient status, additional functional criteria and insurance authorization.    Follow Up Recommendations  No OT follow up    Assistance Recommended at Discharge Intermittent Supervision/Assistance  Equipment Recommendations  None recommended by OT    Recommendations for Other Services      Precautions / Restrictions Precautions Precautions: Fall Precaution Comments: watch O2 Restrictions Weight Bearing Restrictions: No       Mobility Bed Mobility Overal bed mobility: Independent             General bed mobility comments: no assistance needed with bed mobility    Transfers Overall transfer level: Needs assistance Equipment used: None Transfers: Sit to/from Stand Sit to Stand: Supervision           General transfer comment: min guard due to equipment     Balance Overall balance assessment: Needs assistance Sitting-balance support: No upper extremity supported;Feet unsupported Sitting balance-Leahy Scale: Good     Standing  balance support: No upper extremity supported;During functional activity Standing balance-Leahy Scale: Fair Standing balance comment: demonstrated good balance during mobility and transfers                           ADL either performed or assessed with clinical judgement   ADL Overall ADL's : Needs assistance/impaired                         Toilet Transfer: Copy Details (indicate cue type and reason): simulated in room, min guard due to equipment         Functional mobility during ADLs: Min guard General ADL Comments: patient states he takes himself to bathroom with staff present    Extremity/Trunk Assessment Upper Extremity Assessment RUE Deficits / Details: limited over head ROM due to old rotator cuff injury, elbow, wrist and hand ROM is [facility] & 4/5 MMT RUE Sensation: WNL RUE Coordination: WNL LUE Deficits / Details: limited over head ROM due to old shoulder injury however has more range on the L>R. shoulder is 3/5 MMT. elbow wrist and hand are WFL LUE Sensation: WNL LUE Coordination: WNL            Vision       Perception     Praxis      Cognition Arousal/Alertness: Awake/alert Behavior During Therapy: WFL for tasks assessed/performed Overall Cognitive Status: Within Functional Limits for tasks assessed  General Comments: followed directions well.  demonstrated good safety          Exercises     Shoulder Instructions       General Comments      Pertinent Vitals/ Pain       Pain Assessment: Faces Faces Pain Scale: Hurts a little bit Pain Location: low back Pain Descriptors / Indicators: Discomfort;Grimacing Pain Intervention(s): Monitored during session  Home Living                                          Prior Functioning/Environment              Frequency           Progress Toward Goals  OT Goals(current goals can  now be found in the care plan section)  Progress towards OT goals: Progressing toward goals  Acute Rehab OT Goals Patient Stated Goal: go home OT Goal Formulation: With patient Time For Goal Achievement: 05/27/21 Potential to Achieve Goals: Good ADL Goals Pt Will Perform Grooming: Independently;standing Pt Will Perform Lower Body Bathing: with modified independence;sit to/from stand Pt Will Perform Lower Body Dressing: with modified independence;sit to/from stand Pt Will Transfer to Toilet: Independently;ambulating  Plan Discharge plan remains appropriate    Co-evaluation                 AM-PAC OT "6 Clicks" Daily Activity     Outcome Measure   Help from another person eating meals?: None Help from another person taking care of personal grooming?: A Little Help from another person toileting, which includes using toliet, bedpan, or urinal?: A Little Help from another person bathing (including washing, rinsing, drying)?: A Little Help from another person to put on and taking off regular upper body clothing?: None Help from another person to put on and taking off regular lower body clothing?: A Little 6 Click Score: 20    End of Session Equipment Utilized During Treatment: Gait belt;Oxygen  OT Visit Diagnosis: Unsteadiness on feet (R26.81);Other abnormalities of gait and mobility (R26.89);Muscle weakness (generalized) (M62.81);Pain   Activity Tolerance Patient tolerated treatment well   Patient Left in bed;with bed alarm set;with call bell/phone within reach   Nurse Communication Mobility status        Time: 1457-1511 OT Time Calculation (min): 14 min  Charges: OT General Charges $OT Visit: 1 Visit OT Treatments $Therapeutic Activity: 8-22 mins  Lodema Hong, [place]  Pager [phone] Office [place] 05/17/2021, 3:28 PM

## 2021-05-17 NOTE — Progress Notes (Signed)
PROGRESS NOTE        PATIENT DETAILS Name: Joseph Villa Age: 80 y.o. Sex: male Date of Birth: Dec 11, 1940 Admit Date: 05/12/2021 Admitting Physician Delfino Lovett Cyndra Numbers, DO VQX:IHWT, Hunt Oris, MD  Brief Narrative: Patient is a 80 y.o. male with history of GERD, HTN, hypothyroidism, DM-2, CKD stage IIIb-who has been having cough-for approximately around 8 days prior to this hospitalization-subsequently diagnosed with COVID-19 around 4-5 days prior to this hospitalization-presenting with worsening cough/shortness of breath and fever-found to have acute hypoxic respiratory failure due to COVID-19 pneumonia.  See below for further details.    Subjective: Remains on 4 L of oxygen this morning-titrated down to 2 L-have asked nurse to see if it can be titrated down further.  Some cough continues.  Objective: Vitals: Blood pressure (!) 164/66, pulse 71, temperature (!) 97.5 F (36.4 C), temperature source Oral, resp. rate 20, height 5' 11.5" (1.816 m), weight 113.4 kg, SpO2 91 %.   Exam: Gen Exam:Alert awake-not in any distress HEENT:atraumatic, normocephalic Chest: B/L clear to auscultation anteriorly CVS:S1S2 regular Abdomen:soft non tender, non distended Extremities:no edema Neurology: Non focal Skin: no rash   Pertinent Labs/Radiology: Recent Labs  Lab 05/13/21 0425 05/14/21 0150 05/15/21 0142 05/16/21 0045 05/17/21 0131  WBC 13.7* 13.6* 16.6* 8.3 10.2  HGB 10.1* 9.6* 9.2* 9.6* 10.4*  PLT 221 257 281 337 361  NA 132*   131* 134* 133* 138 138  K 4.6   4.6 5.1 5.1 5.8* 4.7  CREATININE 2.79*   2.81* 3.02* 2.99* 2.57* 2.27*  AST 24   24 21 29 31 28   ALT 38   42 37 43 48* 48*  ALKPHOS 60   61 56 57 56 60  BILITOT 0.4   0.5 0.4 0.4 0.8 0.4    Assessment/Plan: Acute hypoxic respiratory failure due to COVID-19 pneumonitis: Although improved-still requiring anywhere around 4 L of oxygen-CRP has trended down significantly-volume status is stable-no  longer on levofloxacin-remains on empiric steroids.  Chest x-ray with possible left basilar airspace disease-we will get CT chest to better evaluate lung parenchyma to see extent of pneumonia that could continue to cause persistent hypoxemia.  Will likely need home O2 on discharge.  Recent Labs    05/15/21 0142 05/16/21 0045 05/17/21 0131  DDIMER 1.72* 1.04* 0.89*  CRP 27.1* 20.4* 11.7*    AKI on CKD stage IIIb: AKI likely hemodynamically mediated-renal ultrasound without hydronephrosis-UA with minimal proteinuria-creatinine plateaued and is now downtrending-continue to avoid nephrotoxic agents.  Continue to follow electrolytes periodically.    Lab Results  Component Value Date   CREATININE 2.27 (H) 05/17/2021   CREATININE 2.57 (H) 05/16/2021   CREATININE 2.99 (H) 05/15/2021   Hyperkalemia: Resolved.  Elevated D-dimer: Likely due to COVID-19 infection-has mild hypoxemia-lower extremity Dopplers negative-continue prophylactic heparin.    History of CVA-bilateral carotid artery stenosis-s/p left TCAR 2022: Continue aspirin/Plavix-nonfocal exam.  DM-2 (A1c 6.4 on 10/22) with uncontrolled hyperglycemia due to steroids: CBGs remain elevated-continue Semglee 25 units, 10 units of NovoLog with meals-SSI-follow and readjust   Recent Labs    05/16/21 2035 05/17/21 0729 05/17/21 1222  GLUCAP 195* 134* 121*     Hypothyroidism: Continue with levothyroxine  UUE:[id number] Flomax-follow bladder scans  Mood disorder: Continue Tegretol.  Obesity Estimated body mass index is 34.38 kg/m as calculated from the following:   Height as of this encounter:  5' 11.5" (1.816 m).   Weight as of this encounter: 113.4 kg.   Procedures: None Consults: None DVT Prophylaxis: Heparin Code Status:Full code Family Communication: Spouse-Carol-[phone] updated over the phone on 12/15.  Time spent: 25 minutes-Greater than 50% of this time was spent in counseling, explanation of diagnosis, planning  of further management, and coordination of care.   Disposition Plan: Status is: Inpatient  Remains inpatient appropriate because: Hypoxia due to likely COVID-19 pneumonia/superimposed bacterial pneumonia.    Diet: Diet Order             Diet Carb Modified Fluid consistency: Thin; Room service appropriate? No  Diet effective now                     Antimicrobial agents: Anti-infectives (From admission, onward)    Start     Dose/Rate Route Frequency Ordered Stop   05/14/21 1000  levofloxacin (LEVAQUIN) tablet 250 mg       See Hyperspace for full Linked Orders Report.   250 mg Oral Daily 05/13/21 1210 05/15/21 0929   05/13/21 1300  levofloxacin (LEVAQUIN) tablet 500 mg       See Hyperspace for full Linked Orders Report.   500 mg Oral Daily 05/13/21 1210 05/13/21 1304   05/13/21 1230  azithromycin (ZITHROMAX) tablet 500 mg  Status:  Discontinued        500 mg Oral Daily 05/13/21 1141 05/13/21 1207        MEDICATIONS: Scheduled Meds:  albuterol  2 puff Inhalation BID   aspirin  81 mg Oral Daily   benzonatate  200 mg Oral TID   bromocriptine  1.25 mg Oral Daily   carbamazepine  400 mg Oral QHS   cholecalciferol  2,000 Units Oral q morning   clopidogrel  75 mg Oral Daily   guaiFENesin  600 mg Oral BID   heparin  5,000 Units Subcutaneous Q8H   insulin aspart  0-20 Units Subcutaneous TID WC   insulin aspart  10 Units Subcutaneous TID WC   insulin glargine-yfgn  25 Units Subcutaneous Daily   levothyroxine  112 mcg Oral Q0600   methylPREDNISolone (SOLU-MEDROL) injection  60 mg Intravenous Q12H   pantoprazole  40 mg Oral Daily   tamsulosin  0.4 mg Oral Daily   Continuous Infusions:  PRN Meds:.acetaminophen, albuterol, fluticasone, ondansetron **OR** ondansetron (ZOFRAN) IV   I have personally reviewed following labs and imaging studies  LABORATORY DATA: CBC: Recent Labs  Lab 05/13/21 0425 05/14/21 0150 05/15/21 0142 05/16/21 0045 05/17/21 0131  WBC 13.7*  13.6* 16.6* 8.3 10.2  NEUTROABS 11.9* 12.2* 14.9* 6.5 9.8*  HGB 10.1* 9.6* 9.2* 9.6* 10.4*  HCT 30.7* 30.0* 28.6* 30.7* 31.2*  MCV 91.6 92.9 92.6 94.2 92.0  PLT 221 257 281 337 361     Basic Metabolic Panel: Recent Labs  Lab 05/13/21 0425 05/14/21 0150 05/15/21 0142 05/16/21 0045 05/17/21 0131  NA 132*   131* 134* 133* 138 138  K 4.6   4.6 5.1 5.1 5.8* 4.7  CL 104   104 104 105 108 110  CO2 18*   18* 19* 18* 23 20*  GLUCOSE 267*   267* 260* 216* 175* 211*  BUN 60*   60* 76* 86* 78* 69*  CREATININE 2.79*   2.81* 3.02* 2.99* 2.57* 2.27*  CALCIUM 8.1*   8.1* 8.4* 8.3* 8.8* 8.8*  MG 2.2   2.2 2.4 2.5* 2.7* 2.4  PHOS 2.2*   2.2*  --  4.1  --   --  GFR: Estimated Creatinine Clearance: 34.6 mL/min (A) (by C-G formula based on SCr of 2.27 mg/dL (H)).  Liver Function Tests: Recent Labs  Lab 05/13/21 0425 05/14/21 0150 05/15/21 0142 05/16/21 0045 05/17/21 0131  AST 24   24 21 29 31 28   ALT 38   42 37 43 48* 48*  ALKPHOS 60   61 56 57 56 60  BILITOT 0.4   0.5 0.4 0.4 0.8 0.4  PROT 6.5   6.5 6.3* 6.0* 6.3* 6.3*  ALBUMIN 2.5*   2.6* 2.4* 2.3* 2.3* 2.4*    No results for input(s): LIPASE, AMYLASE in the last 168 hours. No results for input(s): AMMONIA in the last 168 hours.  Coagulation Profile: No results for input(s): INR, PROTIME in the last 168 hours.  Cardiac Enzymes: No results for input(s): CKTOTAL, CKMB, CKMBINDEX, TROPONINI in the last 168 hours.  BNP (last 3 results) No results for input(s): PROBNP in the last 8760 hours.  Lipid Profile: No results for input(s): CHOL, HDL, LDLCALC, TRIG, CHOLHDL, LDLDIRECT in the last 72 hours.  Thyroid Function Tests: No results for input(s): TSH, T4TOTAL, FREET4, T3FREE, THYROIDAB in the last 72 hours.  Anemia Panel: No results for input(s): VITAMINB12, FOLATE, FERRITIN, TIBC, IRON, RETICCTPCT in the last 72 hours.   Urine analysis:    Component Value Date/Time   COLORURINE YELLOW 05/12/2021 1014    APPEARANCEUR HAZY (A) 05/12/2021 1014   LABSPEC 1.018 05/12/2021 1014   PHURINE 5.0 05/12/2021 1014   GLUCOSEU 150 (A) 05/12/2021 1014   GLUCOSEU >=1000 (A) 10/23/2020 0937   HGBUR NEGATIVE 05/12/2021 1014   BILIRUBINUR NEGATIVE 05/12/2021 1014   KETONESUR NEGATIVE 05/12/2021 1014   PROTEINUR 30 (A) 05/12/2021 1014   UROBILINOGEN 0.2 10/23/2020 0937   NITRITE NEGATIVE 05/12/2021 1014   LEUKOCYTESUR NEGATIVE 05/12/2021 1014    Sepsis Labs: Lactic Acid, Venous No results found for: LATICACIDVEN  MICROBIOLOGY: Recent Results (from the past 240 hour(s))  Resp Panel by RT-PCR (Flu A&B, Covid) Nasopharyngeal Swab     Status: Abnormal   Collection Time: 05/12/21 10:31 AM   Specimen: Nasopharyngeal Swab; Nasopharyngeal(NP) swabs in vial transport medium  Result Value Ref Range Status   SARS Coronavirus 2 by RT PCR POSITIVE (A) NEGATIVE Final    Comment: RESULT CALLED TO, READ BACK BY AND VERIFIED WITH: RN C CHAPMAN [id number] AT [id number] BY CM (NOTE) SARS-CoV-2 target nucleic acids are DETECTED.  The SARS-CoV-2 RNA is generally detectable in upper respiratory specimens during the acute phase of infection. Positive results are indicative of the presence of the identified virus, but do not rule out bacterial infection or co-infection with other pathogens not detected by the test. Clinical correlation with patient history and other diagnostic information is necessary to determine patient infection status. The expected result is Negative.  Fact Sheet for Patients: EntrepreneurPulse.com.au  Fact Sheet for Healthcare Providers: IncredibleEmployment.be  This test is not yet approved or cleared by the [place] FDA and  has been authorized for detection and/or diagnosis of SARS-CoV-2 by FDA under an Emergency Use Authorization (EUA).  This EUA will remain in effect (meaning this test can be  used) for the duration of  the COVID-19 declaration under  Section 564(b)(1) of the Act, 21 U.S.C. section 360bbb-3(b)(1), unless the authorization is terminated or revoked sooner.     Influenza A by PCR NEGATIVE NEGATIVE Final   Influenza B by PCR NEGATIVE NEGATIVE Final    Comment: (NOTE) The Xpert Xpress SARS-CoV-2/FLU/RSV plus assay is intended as  an aid in the diagnosis of influenza from Nasopharyngeal swab specimens and should not be used as a sole basis for treatment. Nasal washings and aspirates are unacceptable for Xpert Xpress SARS-CoV-2/FLU/RSV testing.  Fact Sheet for Patients: EntrepreneurPulse.com.au  Fact Sheet for Healthcare Providers: IncredibleEmployment.be  This test is not yet approved or cleared by the [place] FDA and has been authorized for detection and/or diagnosis of SARS-CoV-2 by FDA under an Emergency Use Authorization (EUA). This EUA will remain in effect (meaning this test can be used) for the duration of the COVID-19 declaration under Section 564(b)(1) of the Act, 21 U.S.C. section 360bbb-3(b)(1), unless the authorization is terminated or revoked.  Performed at [facility], [place] [address] [place], [place] [address]     RADIOLOGY STUDIES/RESULTS: DG Chest Port 1V same Day  Result Date: 05/17/2021 CLINICAL DATA:  Shortness of breath, COVID-19 positive. EXAM: PORTABLE CHEST 1 VIEW COMPARISON:  May 13, 2021. FINDINGS: Stable cardiomediastinal silhouette. Right lung is clear. Stable left basilar atelectasis or infiltrate is noted. No pneumothorax is noted. Bony thorax is unremarkable. IMPRESSION: Stable left basilar opacity as described above. Electronically Signed   By: Marijo Conception M.D.   On: 05/17/2021 10:23   ECHOCARDIOGRAM COMPLETE  Result Date: 05/16/2021    ECHOCARDIOGRAM REPORT   Patient Name:   Joseph Villa Date of Exam: 05/16/2021 Medical Rec #:  [id number]        Height:       71.5 in Accession #:    [id number]       Weight:       250.0  lb Date of Birth:  Mar 21, 1941        BSA:          2.330 m Patient Age:    33 years         BP:           148/68 mmHg Patient Gender: M                HR:           58 bpm. Exam Location:  Inpatient Procedure: 2D Echo, Cardiac Doppler and Color Doppler Indications:    Dyspnea  History:        Patient has prior history of Echocardiogram examinations, most                 recent 11/09/2020. Signs/Symptoms:Chest Pain.  Sonographer:    Glo Herring Referring Phys: Humble Comments: Suboptimal apical window. Patient refused imaging agent contrast IMPRESSIONS  1. Left ventricular ejection fraction, by estimation, is 60 to 65%. The left ventricle has normal function. The left ventricle has no regional wall motion abnormalities. There is mild concentric left ventricular hypertrophy. Left ventricular diastolic parameters are consistent with Grade I diastolic dysfunction (impaired relaxation).  2. Right ventricular systolic function is normal. The right ventricular size is normal. Tricuspid regurgitation signal is inadequate for assessing PA pressure.  3. The mitral valve is normal in structure. No evidence of mitral valve regurgitation. No evidence of mitral stenosis.  4. The aortic valve is tricuspid. There is moderate thickening of the aortic valve. Aortic valve regurgitation is not visualized. Aortic valve sclerosis is present, with no evidence of aortic valve stenosis.  5. Aortic dilatation noted. There is mild dilatation of the ascending aorta, measuring 41 mm.  6. The inferior vena cava is normal in size with greater than 50% respiratory variability, suggesting right atrial pressure of  3 mmHg. Comparison(s): No significant change from prior study. FINDINGS  Left Ventricle: Left ventricular ejection fraction, by estimation, is 60 to 65%. The left ventricle has normal function. The left ventricle has no regional wall motion abnormalities. The left ventricular internal cavity size was normal  in size. There is  mild concentric left ventricular hypertrophy. Left ventricular diastolic parameters are consistent with Grade I diastolic dysfunction (impaired relaxation). Right Ventricle: The right ventricular size is normal. No increase in right ventricular wall thickness. Right ventricular systolic function is normal. Tricuspid regurgitation signal is inadequate for assessing PA pressure. Left Atrium: Left atrial size was normal in size. Right Atrium: Right atrial size was normal in size. Pericardium: Trivial pericardial effusion is present. Mitral Valve: The mitral valve is normal in structure. No evidence of mitral valve regurgitation. No evidence of mitral valve stenosis. Tricuspid Valve: The tricuspid valve is normal in structure. Tricuspid valve regurgitation is not demonstrated. No evidence of tricuspid stenosis. Aortic Valve: The aortic valve is tricuspid. There is moderate thickening of the aortic valve. Aortic valve regurgitation is not visualized. Aortic valve sclerosis is present, with no evidence of aortic valve stenosis. Aortic valve mean gradient measures  2.0 mmHg. Aortic valve peak gradient measures 4.2 mmHg. Aortic valve area, by VTI measures 2.29 cm. Pulmonic Valve: The pulmonic valve was grossly normal. Pulmonic valve regurgitation is mild to moderate. No evidence of pulmonic stenosis. Aorta: The aortic root is normal in size and structure and aortic dilatation noted. There is mild dilatation of the ascending aorta, measuring 41 mm. Venous: The inferior vena cava is normal in size with greater than 50% respiratory variability, suggesting right atrial pressure of 3 mmHg. IAS/Shunts: The atrial septum is grossly normal.  LEFT VENTRICLE PLAX 2D LVIDd:         4.20 cm   Diastology LVIDs:         2.80 cm   LV e' medial:    7.18 cm/s LV PW:         1.20 cm   LV E/e' medial:  13.7 LV IVS:        1.20 cm   LV e' lateral:   9.46 cm/s LVOT diam:     2.00 cm   LV E/e' lateral: 10.4 LV SV:         53  LV SV Index:   23 LVOT Area:     3.14 cm  RIGHT VENTRICLE            IVC RV Basal diam:  4.00 cm    IVC diam: 1.50 cm RV Mid diam:    3.10 cm RV S prime:     9.57 cm/s LEFT ATRIUM             Index        RIGHT ATRIUM           Index LA diam:        4.10 cm 1.76 cm/m   RA Area:     22.40 cm LA Vol (A2C):   52.8 ml 22.66 ml/m  RA Volume:   69.10 ml  29.65 ml/m LA Vol (A4C):   60.7 ml 26.05 ml/m LA Biplane Vol: 60.2 ml 25.83 ml/m  AORTIC VALVE                    PULMONIC VALVE AV Area (Vmax):    2.29 cm     PV Vmax:       0.87 m/s AV Area (  Vmean):   2.11 cm     PV Peak grad:  3.0 mmHg AV Area (VTI):     2.29 cm AV Vmax:           103.00 cm/s AV Vmean:          71.100 cm/s AV VTI:            0.230 m AV Peak Grad:      4.2 mmHg AV Mean Grad:      2.0 mmHg LVOT Vmax:         75.10 cm/s LVOT Vmean:        47.800 cm/s LVOT VTI:          0.168 m LVOT/AV VTI ratio: 0.73  AORTA Ao Root diam: 3.80 cm Ao Asc diam:  3.80 cm MITRAL VALVE MV Area (PHT): 4.86 cm    SHUNTS MV Decel Time: 156 msec    Systemic VTI:  0.17 m MV E velocity: 98.50 cm/s  Systemic Diam: 2.00 cm MV A velocity: 73.70 cm/s MV E/A ratio:  1.34 Rudean Haskell MD Electronically signed by Rudean Haskell MD Signature Date/Time: 05/16/2021/2:04:00 PM    Final    VAS [place] LOWER EXTREMITY VENOUS (DVT)  Result Date: 05/15/2021  Lower Venous DVT Study Patient Name:  Joseph Villa  Date of Exam:   05/15/2021 Medical Rec #: [id number]         Accession #:    [id number] Date of Birth: 1940-12-29         Patient Gender: M Patient Age:   14 years Exam Location:  [facility] Procedure:      VAS [place] LOWER EXTREMITY VENOUS (DVT) Referring Phys: Oren Binet --------------------------------------------------------------------------------  Indications: Swelling.  Risk Factors: COVID 19 positive. Comparison Study: No prior studies. Performing Technologist: Oliver Hum RVT  Examination Guidelines: A complete evaluation includes B-mode  imaging, spectral Doppler, color Doppler, and power Doppler as needed of all accessible portions of each vessel. Bilateral testing is considered an integral part of a complete examination. Limited examinations for reoccurring indications may be performed as noted. The reflux portion of the exam is performed with the patient in reverse Trendelenburg.  +---------+---------------+---------+-----------+----------+--------------+  RIGHT     Compressibility Phasicity Spontaneity Properties Thrombus Aging  +---------+---------------+---------+-----------+----------+--------------+  CFV       Full            Yes       Yes                                    +---------+---------------+---------+-----------+----------+--------------+  SFJ       Full                                                             +---------+---------------+---------+-----------+----------+--------------+  FV Prox   Full                                                             +---------+---------------+---------+-----------+----------+--------------+  FV Mid    Full                                                             +---------+---------------+---------+-----------+----------+--------------+  FV Distal Full                                                             +---------+---------------+---------+-----------+----------+--------------+  PFV       Full                                                             +---------+---------------+---------+-----------+----------+--------------+  POP       Full            Yes       Yes                                    +---------+---------------+---------+-----------+----------+--------------+  PTV       Full                                                             +---------+---------------+---------+-----------+----------+--------------+  PERO      Full                                                             +---------+---------------+---------+-----------+----------+--------------+    +---------+---------------+---------+-----------+----------+--------------+  LEFT      Compressibility Phasicity Spontaneity Properties Thrombus Aging  +---------+---------------+---------+-----------+----------+--------------+  CFV       Full            Yes       Yes                                    +---------+---------------+---------+-----------+----------+--------------+  SFJ       Full                                                             +---------+---------------+---------+-----------+----------+--------------+  FV Prox   Full                                                             +---------+---------------+---------+-----------+----------+--------------+  FV Mid    Full                                                             +---------+---------------+---------+-----------+----------+--------------+  FV Distal Full                                                             +---------+---------------+---------+-----------+----------+--------------+  PFV       Full                                                             +---------+---------------+---------+-----------+----------+--------------+  POP       Full            Yes       Yes                                    +---------+---------------+---------+-----------+----------+--------------+  PTV       Full                                                             +---------+---------------+---------+-----------+----------+--------------+  PERO      Full                                                             +---------+---------------+---------+-----------+----------+--------------+     Summary: RIGHT: - There is no evidence of deep vein thrombosis in the lower extremity.  - No cystic structure found in the popliteal fossa.  LEFT: - There is no evidence of deep vein thrombosis in the lower extremity.  - No cystic structure found in the popliteal fossa.  *See table(s) above for measurements and observations. Electronically signed  by Deitra Mayo MD on 05/15/2021 at 1:53:51 PM.    Final      LOS: 5 days   Oren Binet, MD  [facility]    To contact the attending provider between 7A-7P or the covering provider during after hours 7P-7A, please log into the web site [email or web address] and access using universal [employer] password for that web site. If you do not have the password, please call the hospital operator.  05/17/2021, 12:50 PM

## 2021-05-18 ENCOUNTER — Other Ambulatory Visit (HOSPITAL_COMMUNITY): Payer: Self-pay

## 2021-05-18 LAB — BASIC METABOLIC PANEL
Anion gap: 8 (ref 5–15)
BUN: 58 mg/dL — ABNORMAL HIGH (ref 8–23)
CO2: 21 mmol/L — ABNORMAL LOW (ref 22–32)
Calcium: 8.8 mg/dL — ABNORMAL LOW (ref 8.9–10.3)
Chloride: 109 mmol/L (ref 98–111)
Creatinine, Ser: 1.98 mg/dL — ABNORMAL HIGH (ref 0.61–1.24)
GFR, Estimated: 34 mL/min — ABNORMAL LOW (ref >60)
Glucose, Bld: 277 mg/dL — ABNORMAL HIGH (ref 70–99)
Potassium: 5.1 mmol/L (ref 3.5–5.1)
Sodium: 138 mmol/L (ref 135–145)

## 2021-05-18 LAB — C-REACTIVE PROTEIN: CRP: 7.7 mg/dL — ABNORMAL HIGH (ref <1.0)

## 2021-05-18 LAB — GLUCOSE, CAPILLARY
Glucose-Capillary: 187 mg/dL — ABNORMAL HIGH (ref 70–99)
Glucose-Capillary: 212 mg/dL — ABNORMAL HIGH (ref 70–99)

## 2021-05-18 MED ORDER — BENZONATATE 200 MG PO CAPS
200.0000 mg | ORAL_CAPSULE | Freq: Three times a day (TID) | ORAL | 0 refills | Status: DC | PRN
  Filled 2021-05-18: qty 20, 7d supply, fill #0

## 2021-05-18 MED ORDER — ALBUTEROL SULFATE HFA 108 (90 BASE) MCG/ACT IN AERS
2.0000 | INHALATION_SPRAY | Freq: Four times a day (QID) | RESPIRATORY_TRACT | 0 refills | Status: DC | PRN
  Filled 2021-05-18: qty 8.5, 25d supply, fill #0

## 2021-05-18 MED ORDER — AMLODIPINE BESYLATE 10 MG PO TABS
10.0000 mg | ORAL_TABLET | Freq: Every day | ORAL | Status: DC
  Administered 2021-05-18: 10 mg via ORAL
  Filled 2021-05-18: qty 1

## 2021-05-18 MED ORDER — PREDNISONE 10 MG PO TABS
ORAL_TABLET | ORAL | 0 refills | Status: DC
  Filled 2021-05-18: qty 10, 4d supply, fill #0

## 2021-05-18 MED ORDER — PREDNISONE 20 MG PO TABS
40.0000 mg | ORAL_TABLET | Freq: Every day | ORAL | Status: DC
  Administered 2021-05-18: 40 mg via ORAL
  Filled 2021-05-18: qty 2

## 2021-05-18 MED ORDER — AMLODIPINE BESYLATE 10 MG PO TABS
10.0000 mg | ORAL_TABLET | Freq: Every day | ORAL | 1 refills | Status: DC
  Filled 2021-05-18: qty 30, 30d supply, fill #0

## 2021-05-18 NOTE — Discharge Summary (Addendum)
PATIENT DETAILS Name: Joseph Villa Age: 80 y.o. Sex: male Date of Birth: 1940-10-27 MRN: [id number]. Admitting Physician: Doran Heater, DO NWG:NFAO, Hunt Oris, MD  Admit Date: 05/12/2021 Discharge date: 05/18/2021  Recommendations for Outpatient Follow-up:  Follow up with PCP in 1-2 weeks Please obtain CMP/CBC in one week Please reassess whether patient still requires home O2 at next visit Please repeat two-view chest x-ray or CT chest in 4 to 6 weeks. Incidental finding on CT chest-nodular contour of liver suggesting cirrhosis-stable for outpatient follow-up with PCP  Admitted From:  Home  Disposition: [place]: Yes  Equipment/Devices: Home O2-2 L/min.  Discharge Condition: Stable  CODE STATUS: FULL CODE  Diet recommendation:  Diet Order             Diet - low sodium heart healthy           Diet Carb Modified           Diet Carb Modified Fluid consistency: Thin; Room service appropriate? No  Diet effective now                    Brief Summary: Patient is a 80 y.o. male with history of GERD, HTN, hypothyroidism, DM-2, CKD stage IIIb-who has been having cough-for approximately around 8 days prior to this hospitalization-subsequently diagnosed with COVID-19 around 4-5 days prior to this hospitalization-presenting with worsening cough/shortness of breath and fever-found to have acute hypoxic respiratory failure due to COVID-19 pneumonia.  See below for further details.  Brief Hospital Course: Acute hypoxic respiratory failure due to COVID-19 pneumonitis: Much better-hypoxia has gradually improved-CRP has trended down-treated with IV steroids and a brief course of levofloxacin.  CT chest/CXR all confirms typical changes consistent with COVID-19 pneumonitis.  Oxygen requirements have come down-~few days ago he was requiring approximately 4 L of oxygen with ambulation and 2 L of oxygen at rest-this morning-he is just either requiring room air or  minimal oxygen amounts at rest-and 2 L with ambulation.  Plans are to continue tapering steroids on discharge.  PCP to reassess whether patient requires home O2 at next visit.    AKI on CKD stage IIIb: AKI likely hemodynamically mediated-renal ultrasound without hydronephrosis-UA with minimal proteinuria-creatinine plateaued and is now downtrending-continue to avoid nephrotoxic agents.  Continue to follow electrolytes periodically.    Hyperkalemia: Resolved.   Elevated D-dimer: Likely due to COVID-19 infection-has mild hypoxemia-lower extremity Dopplers negative-2D echo without any significant RV dysfunction.  Maintained on prophylactic heparin-hypoxia slowly improving-doubt further work-up is required.     History of CVA-bilateral carotid artery stenosis-s/p left TCAR 2022: Continue aspirin/Plavix-nonfocal exam.   DM-2 (A1c 6.4 on 10/22) with uncontrolled hyperglycemia due to steroids: CBG stable on insulin-since patient's steroid dosage has been dramatically reduced-suspect does not need to continue insulin on discharge-he will be maintained on his usual diabetic regimen.  HTN: Losartan/HCTZ on hold as patient developed AKI due to COVID-started on amlodipine with good BP control.  Please reassess at next visit whether patient's usual antihypertensive medications can be restarted.  ?  Cirrhosis:Incidental finding on CT chest-nodular contour of liver suggesting cirrhosis-stable for outpatient follow-up with PCP.  Please note-spouse/patient aware of this finding  Hypothyroidism: Continue with levothyroxine  ZHY:[id number] Flomax-follow bladder scans  OSA: CPAP nightly  Mood disorder: Continue Tegretol.   Obesity Estimated body mass index is 34.38 kg/m as calculated from the following:   Height as of this encounter: 5' 11.5" (1.816 m).   Weight as of this  encounter: 113.4 kg.     Procedures None  Discharge Diagnoses:  Principal Problem:   Acute respiratory failure with hypoxia  (HCC) Active Problems:   Hypothyroidism   Obstructive sleep apnea   Hypertension associated with diabetes ([place])   CKD (chronic kidney disease), stage III ([place])   COVID-19 virus infection   AKI (acute kidney injury) ([place])   Metabolic acidosis   Discharge Instructions:  Activity:  As tolerated with Full fall precautions use walker/cane & assistance as needed  Discharge Instructions     Call MD for:  difficulty breathing, headache or visual disturbances   Complete by: As directed    Diet - low sodium heart healthy   Complete by: As directed    Diet Carb Modified   Complete by: As directed    Discharge instructions   Complete by: As directed    Follow with Primary MD  Biagio Borg, MD in 1-2 weeks  Please get a complete blood count and chemistry panel checked by your Primary MD at your next visit, and again as instructed by your Primary MD.  Get Medicines reviewed and adjusted: Please take all your medications with you for your next visit with your Primary MD  Laboratory/radiological data: Please request your Primary MD to go over all hospital tests and procedure/radiological results at the follow up, please ask your Primary MD to get all Hospital records sent to his/her office.  In some cases, they will be blood work, cultures and biopsy results pending at the time of your discharge. Please request that your primary care M.D. follows up on these results.  Also Note the following: If you experience worsening of your admission symptoms, develop shortness of breath, life threatening emergency, suicidal or homicidal thoughts you must seek medical attention immediately by calling 911 or calling your MD immediately  if symptoms less severe.  You must read complete instructions/literature along with all the possible adverse reactions/side effects for all the Medicines you take and that have been prescribed to you. Take any new Medicines after you have completely understood and accpet  all the possible adverse reactions/side effects.   Do not drive when taking Pain medications or sleeping medications (Benzodaizepines)  Do not take more than prescribed Pain, Sleep and Anxiety Medications. It is not advisable to combine anxiety,sleep and pain medications without talking with your primary care practitioner  Special Instructions: If you have smoked or chewed Tobacco  in the last 2 yrs please stop smoking, stop any regular Alcohol  and or any Recreational drug use.  Wear Seat belts while driving.  Please note: You were cared for by a hospitalist during your hospital stay. Once you are discharged, your primary care physician will handle any further medical issues. Please note that NO REFILLS for any discharge medications will be authorized once you are discharged, as it is imperative that you return to your primary care physician (or establish a relationship with a primary care physician if you do not have one) for your post hospital discharge needs so that they can reassess your need for medications and monitor your lab values.   1.)  Stay on home oxygen 2 L/min at all times  2.)  Please discuss with your primary care practitioner at next visit whether you still need to continue home O2  3.)  Please seek immediate medical attention if you develop worsening shortness of breath  4.)  Please ask your primary care practitioner to repeat a two-view chest x-ray or a CT  scan of your chest in 4 to 6 weeks to ensure resolution of the infiltrates.   Increase activity slowly   Complete by: As directed       Allergies as of 05/18/2021       Reactions   Cefuroxime Axetil Anaphylaxis   REACTION: anaphylaxis-ceftin   Cefprozil    REACTION: unknown   Cephalosporins Nausea Only   Crestor [rosuvastatin Calcium]    Muscle aches    Lipitor [atorvastatin]    myalgiaas   Metformin And Related Diarrhea   Rabeprazole Sodium Nausea Only   REACTION: nausea        Medication List      STOP taking these medications    hydrochlorothiazide 25 MG tablet Commonly known as: HYDRODIURIL   nirmatrelvir/ritonavir EUA (renal dosing) 10 x 150 MG & 10 x $Re'100MG'uDg$  Tabs Commonly known as: PAXLOVID   olmesartan 40 MG tablet Commonly known as: BENICAR   oxyCODONE-acetaminophen 5-325 MG tablet Commonly known as: Percocet       TAKE these medications    Accu-Chek FastClix Lancet Kit AS DIRECTED   Accu-Chek FastClix Lancets Misc USE TWICE DAILY   albuterol 108 (90 Base) MCG/ACT inhaler Commonly known as: VENTOLIN HFA Inhale 2 puffs into the lungs every 6 (six) hours as needed for wheezing or shortness of breath.   amLODipine 10 MG tablet Commonly known as: NORVASC Take 1 tablet (10 mg total) by mouth daily. Start taking on: May 19, 2021   aspirin 81 MG chewable tablet Chew 1 tablet (81 mg total) by mouth daily.   benzonatate 200 MG capsule Commonly known as: TESSALON Take 1 capsule (200 mg total) by mouth 3 (three) times daily as needed for cough.   bromocriptine 2.5 MG tablet Commonly known as: PARLODEL Take 0.5 tablets (1.25 mg total) by mouth daily.   carbamazepine 200 MG tablet Commonly known as: TEGRETOL TAKE 2 TABLETS AT BEDTIME   clopidogrel 75 MG tablet Commonly known as: PLAVIX Take 1 tablet (75 mg total) by mouth daily.   dapagliflozin propanediol 5 MG Tabs tablet Commonly known as: Farxiga Take 1 tablet (5 mg total) by mouth daily before breakfast.   desoximetasone 0.25 % cream Commonly known as: TOPICORT Apply 1 application topically 2 (two) times daily. What changed:  when to take this reasons to take this   ezetimibe 10 MG tablet Commonly known as: ZETIA Take 1 tablet (10 mg total) by mouth daily.   Fish Oil 1200 MG Caps Take 1 capsule by mouth daily.   fluticasone 50 MCG/ACT nasal spray Commonly known as: FLONASE ONE SPRAY INTO NOSE EVERY DAY What changed: See the new instructions.   GLUCOSAMINE PO Take 1 tablet by  mouth daily.   glucose blood test strip Use to check blood sugar 2 times per day. DX Code: E11.9   glucose blood test strip Commonly known as: Accu-Chek Aviva Plus Used to check blood sugars twice a day Dx E11.9   Accu-Chek Guide test strip Generic drug: glucose blood CHECK BLOOD GLUCOSE (SUGAR) TWICE DAILY   levothyroxine 112 MCG tablet Commonly known as: SYNTHROID TAKE ONE TABLET EACH DAY What changed: See the new instructions.   nateglinide 60 MG tablet Commonly known as: STARLIX TAKE ONE-HALF TABLET 3 TIMES DAILY WITH MEALS   pantoprazole 40 MG tablet Commonly known as: PROTONIX TAKE ONE TABLET DAILY   predniSONE 10 MG tablet Commonly known as: DELTASONE Take 40 mg daily for 1 day, 30 mg daily for 1 day, 20 mg daily for  1 days,10 mg daily for 1 day, then stop   PRESERVISION AREDS 2 PO Take 1 tablet by mouth daily.   Repatha SureClick 206 MG/ML Soaj Generic drug: Evolocumab Inject 1 Dose into the skin every 14 (fourteen) days.   sitaGLIPtin 50 MG tablet Commonly known as: Januvia Take 1 tablet (50 mg total) by mouth daily.   tamsulosin 0.4 MG Caps capsule Commonly known as: FLOMAX TAKE ONE CAPSULE EACH DAY What changed: See the new instructions.   TURMERIC CURCUMIN PO Take 500 mg by mouth daily.   vitamin B-12 1000 MCG tablet Commonly known as: CYANOCOBALAMIN Take 1,000 mcg by mouth daily.   Vitamin D3 50 MCG (2000 UT) Tabs Take 2,000 Units by mouth every morning.               Durable Medical Equipment  (From admission, onward)           Start     Ordered   05/18/21 1053  For home use only DME oxygen  Once       Comments: 2L at rest 4L with ambulation  Question Answer Comment  Length of Need 6 Months   Mode or (Route) Nasal cannula   Liters per Minute 2   Frequency Continuous (stationary and portable oxygen unit needed)   Oxygen conserving device Yes   Oxygen delivery system Gas      05/18/21 1052            Allergies   Allergen Reactions   Cefuroxime Axetil Anaphylaxis    REACTION: anaphylaxis-ceftin    Cefprozil     REACTION: unknown   Cephalosporins Nausea Only   Crestor [Rosuvastatin Calcium]     Muscle aches    Lipitor [Atorvastatin]     myalgiaas   Metformin And Related Diarrhea   Rabeprazole Sodium Nausea Only    REACTION: nausea      Consultations:  None    Other Procedures/Studies: DG Chest 2 View  Result Date: 05/12/2021 CLINICAL DATA:  80 year old male positive for COVID-19 1 week ago. Disoriented. EXAM: CHEST - 2 VIEW COMPARISON:  Chest radiographs 07/09/2013 and earlier. FINDINGS: Low lung volumes and mild respiratory motion. Stable cardiac size and mediastinal contours. Borderline cardiomegaly. Crowding of lung markings bilaterally. No pleural effusion or consolidation. Visualized tracheal air column is within normal limits. No pneumothorax. No acute osseous abnormality identified. Negative visible bowel gas. IMPRESSION: Low lung volumes with crowding of lung markings, difficult to exclude a mild viral pneumonia. But no consolidation or pleural effusion. Electronically Signed   By: Genevie Ann M.D.   On: 05/12/2021 09:42   CT HEAD WO CONTRAST (5MM)  Result Date: 05/12/2021 CLINICAL DATA:  Delirium EXAM: CT HEAD WITHOUT CONTRAST TECHNIQUE: Contiguous axial images were obtained from the base of the skull through the vertex without intravenous contrast. COMPARISON:  11/08/2020 FINDINGS: Brain: There is no acute intracranial hemorrhage, mass effect, or edema. Gray-white differentiation is preserved. There is no extra-axial fluid collection. Ventricles and sulci are within normal limits in size and configuration. Minimal patchy low-density in the supratentorial white matter is nonspecific but may reflect minor chronic microvascular ischemic changes. Vascular: There is atherosclerotic calcification at the skull base. Skull: Calvarium is unremarkable. Sinuses/Orbits: Paranasal sinus mucosal  thickening with air-fluid levels. Other: None. IMPRESSION: No acute intracranial abnormality. Electronically Signed   By: Macy Mis M.D.   On: 05/12/2021 11:03   CT CHEST WO CONTRAST  Result Date: 05/17/2021 CLINICAL DATA:  Pneumonia, abnormal chest x-ray, COVID-19 positive  EXAM: CT CHEST WITHOUT CONTRAST TECHNIQUE: Multidetector CT imaging of the chest was performed following the standard protocol without IV contrast. COMPARISON:  05/17/2021 FINDINGS: Cardiovascular: Unenhanced imaging of the heart and great vessels demonstrates no pericardial effusion. Normal caliber of the thoracic aorta. Evaluation of the vascular lumen is limited without IV contrast. There is mild atherosclerosis of the aortic arch, with moderate atherosclerosis of the coronary vasculature. Mediastinum/Nodes: No enlarged mediastinal or axillary lymph nodes. Thyroid gland, trachea, and esophagus demonstrate no significant findings. Lungs/Pleura: There are patchy bilateral ground-glass opacities, greatest in the mid and lower lung zones, in a pattern consistent with known history of COVID-19. No effusion or pneumothorax. Focal eventration of the left hemidiaphragm and prominent epicardial fat pad likely account for the density seen at the left lateral lung base on chest x-ray. Upper Abdomen: Nodular contour of the liver capsule consistent with cirrhosis. No acute upper abdominal finding. Musculoskeletal: No acute or destructive bony lesions. Reconstructed images demonstrate no additional findings. IMPRESSION: 1. Multifocal bilateral ground-glass airspace disease consistent with COVID 19 pneumonia. 2. Prominent epicardial fat pad and eventration of the left hemidiaphragm likely account for the focal consolidation at the left lateral lung base on chest x-ray. 3. Cirrhosis. 4. Aortic Atherosclerosis (ICD10-I70.0). Coronary artery atherosclerosis. Electronically Signed   By: Sharlet Salina M.D.   On: 05/17/2021 15:34   US RENAL  Result  Date: 05/12/2021 CLINICAL DATA:  Acute kidney injury EXAM: RENAL / URINARY TRACT ULTRASOUND COMPLETE COMPARISON:  12/10/2017 FINDINGS: Right Kidney: Renal measurements: 11.7 x 7.1 x 7.8 cm = volume: 335 mL. Echogenicity within normal limits. No mass or hydronephrosis visualized. Left Kidney: Renal measurements: 11.4 x 5.7 x 4.4 cm = volume: 166 mL. Echogenicity within normal limits. No mass or hydronephrosis visualized. Bladder: Appears normal for degree of bladder distention. Other: Diffuse increased echogenicity of the visualized portions of the hepatic parenchyma are a nonspecific indicator of hepatocellular dysfunction, most commonly steatosis. IMPRESSION: No significant sonographic abnormality of the kidneys. Electronically Signed   By: Acquanetta Belling M.D.   On: 05/12/2021 14:14   DG Chest Port 1V same Day  Result Date: 05/17/2021 CLINICAL DATA:  Shortness of breath, COVID-19 positive. EXAM: PORTABLE CHEST 1 VIEW COMPARISON:  May 13, 2021. FINDINGS: Stable cardiomediastinal silhouette. Right lung is clear. Stable left basilar atelectasis or infiltrate is noted. No pneumothorax is noted. Bony thorax is unremarkable. IMPRESSION: Stable left basilar opacity as described above. Electronically Signed   By: Lupita Raider M.D.   On: 05/17/2021 10:23   DG Chest Port 1V same Day  Result Date: 05/13/2021 CLINICAL DATA:  Short of breath EXAM: PORTABLE CHEST 1 VIEW COMPARISON:  Prior chest x-ray yesterday FINDINGS: Stable cardiac and mediastinal contours. Slightly increased pulmonary vascular congestion. Persistent left basilar patchy airspace opacity likely reflecting atelectasis. No pneumothorax. No large effusion. No acute osseous abnormality. IMPRESSION: Increase in pulmonary vascular congestion without overt edema. Persistent patchy left basilar airspace opacity which may reflect atelectasis or infiltrate. Electronically Signed   By: Malachy Moan M.D.   On: 05/13/2021 08:15   ECHOCARDIOGRAM  COMPLETE  Result Date: 05/16/2021    ECHOCARDIOGRAM REPORT   Patient Name:   Joseph Villa Date of Exam: 05/16/2021 Medical Rec #:  [id number]        Height:       71.5 in Accession #:    [id number]       Weight:       250.0 lb Date of Birth:  01-10-1941  BSA:          2.330 m Patient Age:    81 years         BP:           148/68 mmHg Patient Gender: M                HR:           58 bpm. Exam Location:  Inpatient Procedure: 2D Echo, Cardiac Doppler and Color Doppler Indications:    Dyspnea  History:        Patient has prior history of Echocardiogram examinations, most                 recent 11/09/2020. Signs/Symptoms:Chest Pain.  Sonographer:    Glo Herring Referring Phys: [place] Comments: Suboptimal apical window. Patient refused imaging agent contrast IMPRESSIONS  1. Left ventricular ejection fraction, by estimation, is 60 to 65%. The left ventricle has normal function. The left ventricle has no regional wall motion abnormalities. There is mild concentric left ventricular hypertrophy. Left ventricular diastolic parameters are consistent with Grade I diastolic dysfunction (impaired relaxation).  2. Right ventricular systolic function is normal. The right ventricular size is normal. Tricuspid regurgitation signal is inadequate for assessing PA pressure.  3. The mitral valve is normal in structure. No evidence of mitral valve regurgitation. No evidence of mitral stenosis.  4. The aortic valve is tricuspid. There is moderate thickening of the aortic valve. Aortic valve regurgitation is not visualized. Aortic valve sclerosis is present, with no evidence of aortic valve stenosis.  5. Aortic dilatation noted. There is mild dilatation of the ascending aorta, measuring 41 mm.  6. The inferior vena cava is normal in size with greater than 50% respiratory variability, suggesting right atrial pressure of 3 mmHg. Comparison(s): No significant change from prior study. FINDINGS  Left  Ventricle: Left ventricular ejection fraction, by estimation, is 60 to 65%. The left ventricle has normal function. The left ventricle has no regional wall motion abnormalities. The left ventricular internal cavity size was normal in size. There is  mild concentric left ventricular hypertrophy. Left ventricular diastolic parameters are consistent with Grade I diastolic dysfunction (impaired relaxation). Right Ventricle: The right ventricular size is normal. No increase in right ventricular wall thickness. Right ventricular systolic function is normal. Tricuspid regurgitation signal is inadequate for assessing PA pressure. Left Atrium: Left atrial size was normal in size. Right Atrium: Right atrial size was normal in size. Pericardium: Trivial pericardial effusion is present. Mitral Valve: The mitral valve is normal in structure. No evidence of mitral valve regurgitation. No evidence of mitral valve stenosis. Tricuspid Valve: The tricuspid valve is normal in structure. Tricuspid valve regurgitation is not demonstrated. No evidence of tricuspid stenosis. Aortic Valve: The aortic valve is tricuspid. There is moderate thickening of the aortic valve. Aortic valve regurgitation is not visualized. Aortic valve sclerosis is present, with no evidence of aortic valve stenosis. Aortic valve mean gradient measures  2.0 mmHg. Aortic valve peak gradient measures 4.2 mmHg. Aortic valve area, by VTI measures 2.29 cm. Pulmonic Valve: The pulmonic valve was grossly normal. Pulmonic valve regurgitation is mild to moderate. No evidence of pulmonic stenosis. Aorta: The aortic root is normal in size and structure and aortic dilatation noted. There is mild dilatation of the ascending aorta, measuring 41 mm. Venous: The inferior vena cava is normal in size with greater than 50% respiratory variability, suggesting right atrial pressure of 3 mmHg. IAS/Shunts: The  atrial septum is grossly normal.  LEFT VENTRICLE PLAX 2D LVIDd:         4.20  cm   Diastology LVIDs:         2.80 cm   LV e' medial:    7.18 cm/s LV PW:         1.20 cm   LV E/e' medial:  13.7 LV IVS:        1.20 cm   LV e' lateral:   9.46 cm/s LVOT diam:     2.00 cm   LV E/e' lateral: 10.4 LV SV:         53 LV SV Index:   23 LVOT Area:     3.14 cm  RIGHT VENTRICLE            IVC RV Basal diam:  4.00 cm    IVC diam: 1.50 cm RV Mid diam:    3.10 cm RV S prime:     9.57 cm/s LEFT ATRIUM             Index        RIGHT ATRIUM           Index LA diam:        4.10 cm 1.76 cm/m   RA Area:     22.40 cm LA Vol (A2C):   52.8 ml 22.66 ml/m  RA Volume:   69.10 ml  29.65 ml/m LA Vol (A4C):   60.7 ml 26.05 ml/m LA Biplane Vol: 60.2 ml 25.83 ml/m  AORTIC VALVE                    PULMONIC VALVE AV Area (Vmax):    2.29 cm     PV Vmax:       0.87 m/s AV Area (Vmean):   2.11 cm     PV Peak grad:  3.0 mmHg AV Area (VTI):     2.29 cm AV Vmax:           103.00 cm/s AV Vmean:          71.100 cm/s AV VTI:            0.230 m AV Peak Grad:      4.2 mmHg AV Mean Grad:      2.0 mmHg LVOT Vmax:         75.10 cm/s LVOT Vmean:        47.800 cm/s LVOT VTI:          0.168 m LVOT/AV VTI ratio: 0.73  AORTA Ao Root diam: 3.80 cm Ao Asc diam:  3.80 cm MITRAL VALVE MV Area (PHT): 4.86 cm    SHUNTS MV Decel Time: 156 msec    Systemic VTI:  0.17 m MV E velocity: 98.50 cm/s  Systemic Diam: 2.00 cm MV A velocity: 73.70 cm/s MV E/A ratio:  1.34 Rudean Haskell MD Electronically signed by Rudean Haskell MD Signature Date/Time: 05/16/2021/2:04:00 PM    Final    VAS [place] LOWER EXTREMITY VENOUS (DVT)  Result Date: 05/15/2021  Lower Venous DVT Study Patient Name:  Joseph Villa  Date of Exam:   05/15/2021 Medical Rec #: [id number]         Accession #:    [id number] Date of Birth: April 14, 1941         Patient Gender: M Patient Age:   78 years Exam Location:  [facility] Procedure:      VAS [place] LOWER EXTREMITY VENOUS (DVT) Referring Phys:  [facility] Mamye Bolds  --------------------------------------------------------------------------------  Indications: Swelling.  Risk Factors: COVID 19 positive. Comparison Study: No prior studies. Performing Technologist: Oliver Hum RVT  Examination Guidelines: A complete evaluation includes B-mode imaging, spectral Doppler, color Doppler, and power Doppler as needed of all accessible portions of each vessel. Bilateral testing is considered an integral part of a complete examination. Limited examinations for reoccurring indications may be performed as noted. The reflux portion of the exam is performed with the patient in reverse Trendelenburg.  +---------+---------------+---------+-----------+----------+--------------+  RIGHT     Compressibility Phasicity Spontaneity Properties Thrombus Aging  +---------+---------------+---------+-----------+----------+--------------+  CFV       Full            Yes       Yes                                    +---------+---------------+---------+-----------+----------+--------------+  SFJ       Full                                                             +---------+---------------+---------+-----------+----------+--------------+  FV Prox   Full                                                             +---------+---------------+---------+-----------+----------+--------------+  FV Mid    Full                                                             +---------+---------------+---------+-----------+----------+--------------+  FV Distal Full                                                             +---------+---------------+---------+-----------+----------+--------------+  PFV       Full                                                             +---------+---------------+---------+-----------+----------+--------------+  POP       Full            Yes       Yes                                    +---------+---------------+---------+-----------+----------+--------------+  PTV       Full                                                              +---------+---------------+---------+-----------+----------+--------------+  PERO      Full                                                             +---------+---------------+---------+-----------+----------+--------------+   +---------+---------------+---------+-----------+----------+--------------+  LEFT      Compressibility Phasicity Spontaneity Properties Thrombus Aging  +---------+---------------+---------+-----------+----------+--------------+  CFV       Full            Yes       Yes                                    +---------+---------------+---------+-----------+----------+--------------+  SFJ       Full                                                             +---------+---------------+---------+-----------+----------+--------------+  FV Prox   Full                                                             +---------+---------------+---------+-----------+----------+--------------+  FV Mid    Full                                                             +---------+---------------+---------+-----------+----------+--------------+  FV Distal Full                                                             +---------+---------------+---------+-----------+----------+--------------+  PFV       Full                                                             +---------+---------------+---------+-----------+----------+--------------+  POP       Full            Yes       Yes                                    +---------+---------------+---------+-----------+----------+--------------+  PTV       Full                                                             +---------+---------------+---------+-----------+----------+--------------+  PERO      Full                                                             +---------+---------------+---------+-----------+----------+--------------+     Summary: RIGHT: - There is no evidence of deep vein thrombosis in  the lower extremity.  - No cystic structure found in the popliteal fossa.  LEFT: - There is no evidence of deep vein thrombosis in the lower extremity.  - No cystic structure found in the popliteal fossa.  *See table(s) above for measurements and observations. Electronically signed by Deitra Mayo MD on 05/15/2021 at 1:53:51 PM.    Final      TODAY-DAY OF DISCHARGE:  Subjective:   Joseph Villa today has no headache,no chest abdominal pain,no new weakness tingling or numbness, feels much better wants to go home today.   Objective:   Blood pressure 137/68, pulse 65, temperature 98.5 F (36.9 C), temperature source Oral, resp. rate 19, height 5' 11.5" (1.816 m), weight 113.4 kg, SpO2 91 %.  Intake/Output Summary (Last 24 hours) at 05/18/2021 1113 Last data filed at 05/18/2021 0800 Gross per 24 hour  Intake --  Output 800 ml  Net -800 ml   Filed Weights   05/12/21 0901  Weight: 113.4 kg    Exam: Awake Alert, Oriented *3, No new F.N deficits, Normal affect .AT,PERRAL Supple Neck,No JVD, No cervical lymphadenopathy appriciated.  Symmetrical Chest wall movement, Good air movement bilaterally, CTAB RRR,No Gallops,Rubs or new Murmurs, No Parasternal Heave +ve B.Sounds, Abd Soft, Non tender, No organomegaly appriciated, No rebound -guarding or rigidity. No Cyanosis, Clubbing or edema, No new Rash or bruise   PERTINENT RADIOLOGIC STUDIES: CT CHEST WO CONTRAST  Result Date: 05/17/2021 CLINICAL DATA:  Pneumonia, abnormal chest x-ray, COVID-19 positive EXAM: CT CHEST WITHOUT CONTRAST TECHNIQUE: Multidetector CT imaging of the chest was performed following the standard protocol without IV contrast. COMPARISON:  05/17/2021 FINDINGS: Cardiovascular: Unenhanced imaging of the heart and great vessels demonstrates no pericardial effusion. Normal caliber of the thoracic aorta. Evaluation of the vascular lumen is limited without IV contrast. There is mild atherosclerosis of the aortic  arch, with moderate atherosclerosis of the coronary vasculature. Mediastinum/Nodes: No enlarged mediastinal or axillary lymph nodes. Thyroid gland, trachea, and esophagus demonstrate no significant findings. Lungs/Pleura: There are patchy bilateral ground-glass opacities, greatest in the mid and lower lung zones, in a pattern consistent with known history of COVID-19. No effusion or pneumothorax. Focal eventration of the left hemidiaphragm and prominent epicardial fat pad likely account for the density seen at the left lateral lung base on chest x-ray. Upper Abdomen: Nodular contour of the liver capsule consistent with cirrhosis. No acute upper abdominal finding. Musculoskeletal: No acute or destructive bony lesions. Reconstructed images demonstrate no additional findings. IMPRESSION: 1. Multifocal bilateral ground-glass airspace disease consistent with COVID 19 pneumonia. 2. Prominent epicardial fat pad and eventration of the left hemidiaphragm likely account for the focal consolidation at the left lateral lung base on chest x-ray. 3. Cirrhosis. 4. Aortic Atherosclerosis (ICD10-I70.0). Coronary artery atherosclerosis. Electronically Signed   By: Randa Ngo M.D.   On: 05/17/2021 15:34   DG Chest Port 1V same Day  Result Date: 05/17/2021 CLINICAL DATA:  Shortness of breath, COVID-19 positive. EXAM: PORTABLE CHEST 1 VIEW COMPARISON:  May 13, 2021. FINDINGS: Stable cardiomediastinal silhouette. Right lung is clear. Stable left basilar atelectasis or infiltrate is noted. No pneumothorax is noted. Bony thorax is unremarkable. IMPRESSION: Stable left basilar opacity as described above. Electronically Signed   By: Marijo Conception M.D.   On: 05/17/2021 10:23     PERTINENT LAB RESULTS: CBC: Recent Labs    05/16/21 0045 05/17/21 0131  WBC 8.3 10.2  HGB 9.6* 10.4*  HCT 30.7* 31.2*  PLT 337 361   CMET CMP     Component Value Date/Time   NA 138 05/18/2021 0059   K 5.1 05/18/2021 0059   CL 109  05/18/2021 0059   CO2 21 (L) 05/18/2021 0059   GLUCOSE 277 (H) 05/18/2021 0059   BUN 58 (H) 05/18/2021 0059   CREATININE 1.98 (H) 05/18/2021 0059   CALCIUM 8.8 (L) 05/18/2021 0059   PROT 6.3 (L) 05/17/2021 0131   ALBUMIN 2.4 (L) 05/17/2021 0131   AST 28 05/17/2021 0131   ALT 48 (H) 05/17/2021 0131   ALKPHOS 60 05/17/2021 0131   BILITOT 0.4 05/17/2021 0131   GFRNONAA 34 (L) 05/18/2021 0059   GFRAA 69 (L) 06/17/2012 0428    GFR Estimated Creatinine Clearance: 39.7 mL/min (A) (by C-G formula based on SCr of 1.98 mg/dL (H)). No results for input(s): LIPASE, AMYLASE in the last 72 hours. No results for input(s): CKTOTAL, CKMB, CKMBINDEX, TROPONINI in the last 72 hours. Invalid input(s): POCBNP Recent Labs    05/16/21 0045 05/17/21 0131  DDIMER 1.04* 0.89*   No results for input(s): HGBA1C in the last 72 hours. No results for input(s): CHOL, HDL, LDLCALC, TRIG, CHOLHDL, LDLDIRECT in the last 72 hours. No results for input(s): TSH, T4TOTAL, T3FREE, THYROIDAB in the last 72 hours.  Invalid input(s): FREET3 No results for input(s): VITAMINB12, FOLATE, FERRITIN, TIBC, IRON, RETICCTPCT in the last 72 hours. Coags: No results for input(s): INR in the last 72 hours.  Invalid input(s): PT Microbiology: Recent Results (from the past 240 hour(s))  Resp Panel by RT-PCR (Flu A&B, Covid) Nasopharyngeal Swab     Status: Abnormal   Collection Time: 05/12/21 10:31 AM   Specimen: Nasopharyngeal Swab; Nasopharyngeal(NP) swabs in vial transport medium  Result Value Ref Range Status   SARS Coronavirus 2 by RT PCR POSITIVE (A) NEGATIVE Final    Comment: RESULT CALLED TO, READ BACK BY AND VERIFIED WITH: RN C CHAPMAN [id number] AT 2458 BY CM (NOTE) SARS-CoV-2 target nucleic acids are DETECTED.  The SARS-CoV-2 RNA is generally detectable in upper respiratory specimens during the acute phase of infection. Positive results are indicative of the presence of the identified virus, but do not rule out  bacterial infection or co-infection with other pathogens not detected by the test. Clinical correlation with patient history and other diagnostic information is necessary to determine patient infection status. The expected result is Negative.  Fact Sheet for Patients: EntrepreneurPulse.com.au  Fact Sheet for Healthcare Providers: IncredibleEmployment.be  This test is not yet approved or cleared by the [place] FDA and  has been authorized for detection and/or diagnosis of SARS-CoV-2 by FDA under an Emergency Use Authorization (EUA).  This EUA will remain in effect (meaning this test can be  used) for the duration of  the COVID-19 declaration under Section 564(b)(1) of the Act, 21 U.S.C. section 360bbb-3(b)(1), unless the authorization is terminated or revoked sooner.     Influenza A by PCR NEGATIVE NEGATIVE Final   Influenza B by PCR NEGATIVE NEGATIVE Final    Comment: (NOTE) The  Xpert Xpress SARS-CoV-2/FLU/RSV plus assay is intended as an aid in the diagnosis of influenza from Nasopharyngeal swab specimens and should not be used as a sole basis for treatment. Nasal washings and aspirates are unacceptable for Xpert Xpress SARS-CoV-2/FLU/RSV testing.  Fact Sheet for Patients: EntrepreneurPulse.com.au  Fact Sheet for Healthcare Providers: IncredibleEmployment.be  This test is not yet approved or cleared by the [place] FDA and has been authorized for detection and/or diagnosis of SARS-CoV-2 by FDA under an Emergency Use Authorization (EUA). This EUA will remain in effect (meaning this test can be used) for the duration of the COVID-19 declaration under Section 564(b)(1) of the Act, 21 U.S.C. section 360bbb-3(b)(1), unless the authorization is terminated or revoked.  Performed at [facility], [place] [address] [place], [place] [address]     FURTHER DISCHARGE INSTRUCTIONS:  Get Medicines  reviewed and adjusted: Please take all your medications with you for your next visit with your Primary MD  Laboratory/radiological data: Please request your Primary MD to go over all hospital tests and procedure/radiological results at the follow up, please ask your Primary MD to get all Hospital records sent to his/her office.  In some cases, they will be blood work, cultures and biopsy results pending at the time of your discharge. Please request that your primary care M.D. goes through all the records of your hospital data and follows up on these results.  Also Note the following: If you experience worsening of your admission symptoms, develop shortness of breath, life threatening emergency, suicidal or homicidal thoughts you must seek medical attention immediately by calling 911 or calling your MD immediately  if symptoms less severe.  You must read complete instructions/literature along with all the possible adverse reactions/side effects for all the Medicines you take and that have been prescribed to you. Take any new Medicines after you have completely understood and accpet all the possible adverse reactions/side effects.   Do not drive when taking Pain medications or sleeping medications (Benzodaizepines)  Do not take more than prescribed Pain, Sleep and Anxiety Medications. It is not advisable to combine anxiety,sleep and pain medications without talking with your primary care practitioner  Special Instructions: If you have smoked or chewed Tobacco  in the last 2 yrs please stop smoking, stop any regular Alcohol  and or any Recreational drug use.  Wear Seat belts while driving.  Please note: You were cared for by a hospitalist during your hospital stay. Once you are discharged, your primary care physician will handle any further medical issues. Please note that NO REFILLS for any discharge medications will be authorized once you are discharged, as it is imperative that you return to  your primary care physician (or establish a relationship with a primary care physician if you do not have one) for your post hospital discharge needs so that they can reassess your need for medications and monitor your lab values.  Total Time spent coordinating discharge including counseling, education and face to face time equals 35 minutes.  Signed: Hassani Sliney 05/18/2021 11:13 AM

## 2021-05-18 NOTE — TOC Transition Note (Addendum)
Transition of Care [facility]) - CM/SW Discharge Note   Patient Details  Name: [name] MRN: [id number] Date of Birth: 07/03/1940  [facility] CM/SW Contact:  Angelita Ingles, RN Phone Number:[phone]  05/18/2021, 1:56 PM   Clinical Narrative:    [facility] consult for DME wheelchair. Wheelchair ordered per [employer] and to be delivered to the room. Wife has been updated. No other TOC needs noted at this time. TOC will sign off.     Barriers to Discharge: Continued Medical Work up   Patient Goals and CMS Choice Patient states their goals for this hospitalization and ongoing recovery are:: return home   Choice offered to / list presented to : Spouse  Discharge Placement                       Discharge Plan and Services   Discharge Planning Services: CM Consult Post Acute Care Choice: Durable Medical Equipment          DME Arranged: Wheelchair manual DME Agency: [employer] Date DME Agency Contacted: 05/18/21 Time DME Agency Contacted: 1200 Representative spoke with at DME Agency: Adela Lank            Social Determinants of Health ([place]) Interventions     Readmission Risk Interventions No flowsheet data found.

## 2021-05-18 NOTE — Progress Notes (Signed)
12/16 IM Letter mailed to patient due to Contact Precaution

## 2021-05-18 NOTE — Progress Notes (Signed)
°   05/18/21 1650  AVS Discharge Documentation  AVS Discharge Instructions Including Medications Provided to patient/caregiver  Name of Person Receiving AVS Discharge Instructions Including Medications Allen Kell  Name of Clinician That Reviewed AVS Discharge Instructions Including Medications Venida Jarvis, RN

## 2021-05-18 NOTE — Plan of Care (Signed)

## 2021-05-18 NOTE — Progress Notes (Signed)
PT Progress Note  SATURATION QUALIFICATIONS: (This note is used to comply with regulatory documentation for home oxygen)  Patient Saturations on Room Air at Rest = 89%  Patient Saturations on Room Air while Ambulating = 83%  Patient Saturations on 2 Liters of oxygen while Ambulating = 90%  Please briefly explain why patient needs home oxygen: Pt required supplemental O2 to maintain SpO2 at 90%.  Lorrin Goodell, PT  Office # [phone] Pager [phone]

## 2021-05-18 NOTE — Progress Notes (Signed)
Physical Therapy Treatment Patient Details Name: Joseph Villa MRN: [id number] DOB: 1940-09-14 Today's Date: 05/18/2021   History of Present Illness Joseph Villa is a 80 y.o. male who presented to the ER on 05/12/2021 with with dyspnea, hypoxia, fever. Tested positive for COVID ~1 week PTA but became more weak and fatigued.   PMH of GERD, hypothyroidism, hypertension, OSA, diabetes, CKD    PT Comments    Pt required supervision transfers and ambulation in room 78' x 2 without AD. Mobilized with/without O2. Desat to 83% amb on RA. SpO2 90% during amb on 2L. Pt returned to bed at end of session.   Recommendations for follow up therapy are one component of a multi-disciplinary discharge planning process, led by the attending physician.  Recommendations may be updated based on patient status, additional functional criteria and insurance authorization.  Follow Up Recommendations  No PT follow up     Assistance Recommended at Discharge Set up Supervision/Assistance  Equipment Recommendations  Other (comment) (home O2)    Recommendations for Other Services       Precautions / Restrictions Precautions Precautions: Fall     Mobility  Bed Mobility Overal bed mobility: Independent                  Transfers Overall transfer level: Needs assistance Equipment used: None Transfers: Sit to/from Stand Sit to Stand: Supervision           General transfer comment: supervision for safety/lines    Ambulation/Gait Ambulation/Gait assistance: Supervision Gait Distance (Feet): 75 Feet (x 2) Assistive device: None Gait Pattern/deviations: Step-through pattern;Decreased stride length Gait velocity: decreased Gait velocity interpretation: 1.31 - 2.62 ft/sec, indicative of limited community ambulator   General Gait Details: Amb 2 gait trials. First on RA and second with 2L O2. Steady gait noted. Limited to in room due to covid precautions.   Stairs              Wheelchair Mobility    Modified Rankin (Stroke Patients Only)       Balance Overall balance assessment: Mild deficits observed, not formally tested                                          Cognition Arousal/Alertness: Awake/alert Behavior During Therapy: WFL for tasks assessed/performed Overall Cognitive Status: Within Functional Limits for tasks assessed                                          Exercises      General Comments        Pertinent Vitals/Pain Faces Pain Scale: Hurts a little bit Pain Location: R hip Pain Descriptors / Indicators: Grimacing;Discomfort Pain Intervention(s): Monitored during session;Repositioned    Home Living                          Prior Function            PT Goals (current goals can now be found in the care plan section) Acute Rehab PT Goals Patient Stated Goal: home Progress towards PT goals: Progressing toward goals    Frequency    Min 3X/week      PT Plan Current plan remains appropriate;Equipment recommendations need to be updated  Co-evaluation              AM-PAC PT "6 Clicks" Mobility   Outcome Measure  Help needed turning from your back to your side while in a flat bed without using bedrails?: None Help needed moving from lying on your back to sitting on the side of a flat bed without using bedrails?: None Help needed moving to and from a bed to a chair (including a wheelchair)?: A Little Help needed standing up from a chair using your arms (e.g., wheelchair or bedside chair)?: A Little Help needed to walk in hospital room?: A Little Help needed climbing 3-5 steps with a railing? : A Little 6 Click Score: 20    End of Session Equipment Utilized During Treatment: Oxygen Activity Tolerance: Patient tolerated treatment well Patient left: in bed;with call bell/phone within reach Nurse Communication: Mobility status PT Visit Diagnosis: Unsteadiness on  feet (R26.81)     Time: 3500-9381 PT Time Calculation (min) (ACUTE ONLY): 25 min  Charges:  $Gait Training: 23-37 mins                     Lorrin Goodell, PT  Office # [phone] Pager [phone]    Lorriane Shire 05/18/2021, 8:47 AM

## 2021-05-21 ENCOUNTER — Other Ambulatory Visit: Payer: Self-pay

## 2021-05-21 ENCOUNTER — Telehealth: Payer: Self-pay

## 2021-05-21 DIAGNOSIS — N1832 Chronic kidney disease, stage 3b: Secondary | ICD-10-CM

## 2021-05-21 DIAGNOSIS — E872 Acidosis, unspecified: Secondary | ICD-10-CM

## 2021-05-21 DIAGNOSIS — U071 COVID-19: Secondary | ICD-10-CM

## 2021-05-21 NOTE — Telephone Encounter (Signed)
Transition Care Management Follow-up Telephone Call Date of discharge and from where: 05/18/2021 from [facility] How have you been since you were released from the hospital? "Feeling tired, but better than I was" Any questions or concerns? No  Items Reviewed: Did the pt receive and understand the discharge instructions provided? Yes  Medications obtained and verified? Yes  Other? No  Any new allergies since your discharge? No  Dietary orders reviewed? Yes, low sodium heart healthy Do you have support at home? Yes , wife & daughter (nurse)  [place] and Equipment/Supplies: Were home health services ordered? no If so, what is the name of the agency? no  Has the agency set up a time to come to the patient's home? no Were any new equipment or medical supplies ordered?  Yes: Oxygen 2L/min What is the name of the medical supply agency? none Were you able to get the supplies/equipment? not applicable Do you have any questions related to the use of the equipment or supplies? Yes: no  Functional Questionnaire: (I = Independent and D = Dependent) ADLs: I  Bathing/Dressing- I  Meal Prep- I  Eating- I  Maintaining continence- I  Transferring/Ambulation- I  Managing Meds- I  Follow up appointments reviewed:  PCP Hospital f/u appt confirmed? Yes  Scheduled to see Cathlean Cower, MD on 05/30/2021 @ 1:40 pm. [place] f/u appt confirmed?  No Are transportation arrangements needed? No  If their condition worsens, is the pt aware to call PCP or go to the Emergency Dept.? Yes Was the patient provided with contact information for the PCP's office or ED? Yes Was to pt encouraged to call back with questions or concerns? Yes

## 2021-05-21 NOTE — Consult Note (Signed)
° °  [facility] CM Inpatient Consult   05/21/2021  Joseph Villa May 22, 1941 [id number]  Late entry for 05/18/21 1:58 pm  [place] Organization [ACO] Patient: [name]  Primary Care Provider:  Biagio Borg, MD [place], [facility]  Patient screened for hospitalization with noted high risk score for unplanned readmission risk and  to assess for potential Embedded Chronic Care Management service needs for post hospital transition.  Review of patient's medical record reveals patient is for home no HH needs noted. Spoke with patient via telephone [COVID-19 positive patient].  Patient states, "honey, I can't hear well at all even with my hearing aides, please have folks contact my wife for information."   Plan:  Will patient for chronic care management follow up.    For questions contact:   Natividad Brood, RN BSN [place] [facility] Liaison  [phone] business mobile phone Toll free office [phone]  Fax number: [phone] [email or web address]

## 2021-05-25 ENCOUNTER — Other Ambulatory Visit: Payer: Self-pay | Admitting: Internal Medicine

## 2021-05-25 ENCOUNTER — Telehealth: Payer: Self-pay | Admitting: Endocrinology

## 2021-05-25 MED ORDER — DAPAGLIFLOZIN PROPANEDIOL 10 MG PO TABS: 10.0000 mg | ORAL_TABLET | Freq: Every day | ORAL | 6 refills | Status: DC

## 2021-05-25 NOTE — Telephone Encounter (Signed)
Patient wife Arbie Cookey has been notified and verbalized understanding

## 2021-05-25 NOTE — Telephone Encounter (Signed)
05/24/2021 7am 199 12/23 7am 185  Taking medication as prescribed. Patient has pneumonia and is currently on oxygen. Would like to know if any medications need to be adjusted.

## 2021-05-25 NOTE — Telephone Encounter (Signed)
Pt wife called stating that BS on husband is HIGH: 05/24/21 BS 199 05/25/21 BS 185 Please call ASAP [phone]

## 2021-05-30 ENCOUNTER — Encounter: Payer: Self-pay | Admitting: Internal Medicine

## 2021-05-30 ENCOUNTER — Other Ambulatory Visit: Payer: Self-pay

## 2021-05-30 ENCOUNTER — Ambulatory Visit (INDEPENDENT_AMBULATORY_CARE_PROVIDER_SITE_OTHER): Payer: Medicare Other | Admitting: Internal Medicine

## 2021-05-30 VITALS — BP 128/62 | HR 85 | Ht 71.5 in | Wt 235.0 lb

## 2021-05-30 DIAGNOSIS — N179 Acute kidney failure, unspecified: Secondary | ICD-10-CM

## 2021-05-30 DIAGNOSIS — J9601 Acute respiratory failure with hypoxia: Secondary | ICD-10-CM | POA: Diagnosis not present

## 2021-05-30 DIAGNOSIS — N182 Chronic kidney disease, stage 2 (mild): Secondary | ICD-10-CM

## 2021-05-30 DIAGNOSIS — E559 Vitamin D deficiency, unspecified: Secondary | ICD-10-CM

## 2021-05-30 DIAGNOSIS — E1159 Type 2 diabetes mellitus with other circulatory complications: Secondary | ICD-10-CM | POA: Diagnosis not present

## 2021-05-30 DIAGNOSIS — E1122 Type 2 diabetes mellitus with diabetic chronic kidney disease: Secondary | ICD-10-CM

## 2021-05-30 DIAGNOSIS — E538 Deficiency of other specified B group vitamins: Secondary | ICD-10-CM

## 2021-05-30 DIAGNOSIS — Z0000 Encounter for general adult medical examination without abnormal findings: Secondary | ICD-10-CM

## 2021-05-30 DIAGNOSIS — I152 Hypertension secondary to endocrine disorders: Secondary | ICD-10-CM

## 2021-05-30 DIAGNOSIS — U071 COVID-19: Secondary | ICD-10-CM | POA: Diagnosis not present

## 2021-05-30 NOTE — Progress Notes (Signed)
Patient ID: Joseph Villa, male   DOB: [birth year], 80 y.o.   MRN: [id number]        Chief Complaint: follow up recent hopsn dec 10-16       HPI:  Joseph Villa is a 80 y.o. male here with daughter, overall not any worse but stamina and speed still much lower than usual, at least not getting any worse.  No falls, but slow to get from room to room, still requires home o2 at sats at home drop to 88% with exertion, such as with taking off the o2 to take a supervised shower.  Pt denies chest pain, increased sob or doe, wheezing, orthopnea, PND, increased LE swelling, palpitations, dizziness or syncope.   Pt denies polydipsia, polyuria, or new focal neuro s/s.  Has routine f/u with cardiology Jan 4 already planned.  Note BP meds scaled bck for now, but BP at home has been similar to today   no new complaints  Transitional Care Management elements noted today: 1)  Date of D/C: as above 2)  Medication reconciliation:  done today at end visit today 3)  Review of D/C summary or other information:  done today and given to daughter 4)  Review of need for f/u on pending diagnostic tests and treatments:  done today - will need cxr next visit 5)  Review of need for Interaction with other providers who will assume or resume care of pt specific problems: done today - none needed except for above 6)  Education of patient/family/guardian or caregiver: done today - none needed, and pt has 2 nurse daughters who provided him a shower chair as well  Wt Readings from Last 3 Encounters:  05/30/21 235 lb (106.6 kg)  05/12/21 250 lb (113.4 kg)  03/19/21 251 lb (113.9 kg)   BP Readings from Last 3 Encounters:  05/30/21 128/62  05/18/21 (!) 174/73  03/19/21 140/60         Past Medical History:  Diagnosis Date   ALLERGIC ASTHMA 11/25/2007   ALLERGIC RHINITIS 11/25/2007   Allergy    Arthritis, lumbar spine 04/18/2011   BIPOLAR DISORDER UNSPECIFIED 10/06/2009   Blood transfusion without reported diagnosis     s/p goiter surgery age 36    COLONIC POLYPS, HX OF 10/06/2009   Degenerative arthritis of hip 04/18/2011   DJD (degenerative joint disease)    DM w/o Complication Type II 09/62/8366   GERD 10/06/2009   Hemorrhage yrs ago   after goiter surgery, tracheostomy inserted and later removed   Hemorrhoid    History of kidney stones    HYPERLIPIDEMIA 10/06/2009   HYPERTENSION, BENIGN 07/27/2009   HYPOTHYROIDISM 10/06/2009   NEPHROLITHIASIS, HX OF 10/06/2009   OBSTRUCTIVE SLEEP APNEA 11/25/2007   cpap setting of 9   Peptic stricture of esophagus    RBBB (right bundle branch block)    Sleep apnea    wears c pap   Stroke [facility])    Past Surgical History:  Procedure Laterality Date   COLONOSCOPY     GANGLION CYST EXCISION     2-3 cysts removed   HERNIA REPAIR     JOINT REPLACEMENT     thyroid Goiter surgery     TONSILLECTOMY     TONSILLECTOMY     TOTAL HIP ARTHROPLASTY  06/16/2012   Procedure: TOTAL HIP ARTHROPLASTY ANTERIOR APPROACH;  Surgeon: Mauri Pole, MD;  Location: [facility];  Service: Orthopedics;  Laterality: Left;   TRANSCAROTID ARTERY REVASCULARIZATION  Left 11/20/2020  Procedure: LEFT TRANSCAROTID ARTERY REVASCULARIZATION;  Surgeon: Marty Heck, MD;  Location: [facility];  Service: Vascular;  Laterality: Left;   UMBILICAL HERNIA REPAIR  several yrs ago   UPPER GASTROINTESTINAL ENDOSCOPY      reports that he quit smoking about 43 years ago. His smoking use included cigarettes and pipe. He has a 22.50 pack-year smoking history. He has never used smokeless tobacco. He reports that he does not drink alcohol and does not use drugs. family history includes Alcohol abuse in his brother; Alcoholism in an other family member; Breast cancer in his father; Diabetes in his brother; Drug abuse in his daughter; Throat cancer in his mother. Allergies  Allergen Reactions   Cefuroxime Axetil Anaphylaxis    REACTION: anaphylaxis-ceftin    Cefprozil     REACTION: unknown    Cephalosporins Nausea Only   Crestor [Rosuvastatin Calcium]     Muscle aches    Lipitor [Atorvastatin]     myalgiaas   Metformin And Related Diarrhea   Rabeprazole Sodium Nausea Only    REACTION: nausea   Current Outpatient Medications on File Prior to Visit  Medication Sig Dispense Refill   Accu-Chek FastClix Lancets MISC USE TWICE DAILY 102 each 2   ACCU-CHEK GUIDE test strip CHECK BLOOD GLUCOSE (SUGAR) TWICE DAILY 200 each 3   albuterol (VENTOLIN HFA) 108 (90 Base) MCG/ACT inhaler Inhale 2 puffs into the lungs every 6 (six) hours as needed for wheezing or shortness of breath. 8.5 g 0   amLODipine (NORVASC) 10 MG tablet Take 1 tablet (10 mg total) by mouth daily. 30 tablet 1   aspirin 81 MG chewable tablet Chew 1 tablet (81 mg total) by mouth daily. 90 tablet 3   bromocriptine (PARLODEL) 2.5 MG tablet Take 0.5 tablets (1.25 mg total) by mouth daily. 45 tablet 3   carbamazepine (TEGRETOL) 200 MG tablet TAKE 2 TABLETS AT BEDTIME 180 tablet 1   Cholecalciferol (VITAMIN D3) 2000 UNITS TABS Take 2,000 Units by mouth every morning.      clopidogrel (PLAVIX) 75 MG tablet Take 1 tablet (75 mg total) by mouth daily. 30 tablet 3   dapagliflozin propanediol (FARXIGA) 10 MG TABS tablet Take 1 tablet (10 mg total) by mouth daily before breakfast. 30 tablet 6   desoximetasone (TOPICORT) 0.25 % cream Apply 1 application topically 2 (two) times daily. (Patient taking differently: Apply 1 application topically 2 (two) times daily as needed (psoriasis).) 100 g 0   Evolocumab (REPATHA SURECLICK) 993 MG/ML SOAJ Inject 1 Dose into the skin every 14 (fourteen) days. 2 mL 11   ezetimibe (ZETIA) 10 MG tablet Take 1 tablet (10 mg total) by mouth daily. 30 tablet 2   fluticasone (FLONASE) 50 MCG/ACT nasal spray ONE SPRAY INTO NOSE EVERY DAY (Patient taking differently: Place 2 sprays into both nostrils daily as needed for rhinitis or allergies.) 48 g 1   Glucosamine HCl (GLUCOSAMINE PO) Take 1 tablet by mouth  daily.     glucose blood (ACCU-CHEK AVIVA PLUS) test strip Used to check blood sugars twice a day Dx E11.9 200 each 0   glucose blood test strip Use to check blood sugar 2 times per day. DX Code: E11.9 200 each 2   Lancets Misc. (ACCU-CHEK FASTCLIX LANCET) KIT AS DIRECTED 1 kit 0   levothyroxine (SYNTHROID) 112 MCG tablet TAKE ONE TABLET EACH DAY (Patient taking differently: Take 112 mcg by mouth at bedtime.) 90 tablet 2   Multiple Vitamins-Minerals (PRESERVISION AREDS 2 PO) Take 1 tablet  by mouth daily.     nateglinide (STARLIX) 60 MG tablet TAKE ONE-HALF TABLET 3 TIMES DAILY WITH MEALS 135 tablet 3   Omega-3 Fatty Acids (FISH OIL) 1200 MG CAPS Take 1 capsule by mouth daily.     pantoprazole (PROTONIX) 40 MG tablet TAKE ONE TABLET DAILY 90 tablet 0   sitaGLIPtin (JANUVIA) 50 MG tablet Take 1 tablet (50 mg total) by mouth daily. 90 tablet 3   tamsulosin (FLOMAX) 0.4 MG CAPS capsule TAKE ONE CAPSULE EACH DAY (Patient taking differently: Take 0.4 mg by mouth daily. TAKE ONE CAPSULE EACH DAY) 90 capsule 3   TURMERIC CURCUMIN PO Take 500 mg by mouth daily.     vitamin B-12 (CYANOCOBALAMIN) 1000 MCG tablet Take 1,000 mcg by mouth daily.     benzonatate (TESSALON) 200 MG capsule Take 1 capsule (200 mg total) by mouth 3 (three) times daily as needed for cough. (Patient not taking: Reported on 05/30/2021) 20 capsule 0   No current facility-administered medications on file prior to visit.        ROS:  All others reviewed and negative.  Objective        PE:  BP 128/62 (BP Location: Left Arm, Patient Position: Sitting, Cuff Size: Large)    Pulse 85    Ht 5' 11.5" (1.816 m)    Wt 235 lb (106.6 kg)    SpO2 95%    BMI 32.32 kg/m                 Constitutional: Pt appears in NAD, slowed in movements but o/w non toxic               HENT: Head: NCAT.                Right Ear: External ear normal.                 Left Ear: External ear normal.                Eyes: . Pupils are equal, round, and reactive to  light. Conjunctivae and EOM are normal               Nose: without d/c or deformity               Neck: Neck supple. Gross normal ROM               Cardiovascular: Normal rate and regular rhythm.                 Pulmonary/Chest: Effort normal and breath sounds without rales or wheezing.                Abd:  Soft, NT, ND, + BS, no organomegaly               Neurological: Pt is alert. At baseline orientation, motor grossly intact               Skin: Skin is warm. No rashes, no other new lesions, LE edema - none               Psychiatric: Pt behavior is normal without agitation   Micro: none  Cardiac tracings I have personally interpreted today:  none  Pertinent Radiological findings (summarize): none   Lab Results  Component Value Date   WBC 10.2 05/17/2021   HGB 10.4 (L) 05/17/2021   HCT 31.2 (L) 05/17/2021   PLT 361 05/17/2021   GLUCOSE 277 (H) 05/18/2021  CHOL 201 (H) 11/21/2020   TRIG 230 (H) 11/21/2020   HDL 37 (L) 11/21/2020   LDLDIRECT 169.0 10/23/2020   LDLCALC 118 (H) 11/21/2020   ALT 48 (H) 05/17/2021   AST 28 05/17/2021   NA 138 05/18/2021   K 5.1 05/18/2021   CL 109 05/18/2021   CREATININE 1.98 (H) 05/18/2021   BUN 58 (H) 05/18/2021   CO2 21 (L) 05/18/2021   TSH 6.54 (H) 10/23/2020   PSA 0.84 10/23/2020   INR 1.0 11/16/2020   HGBA1C 6.4 (A) 03/19/2021   MICROALBUR 13.3 (H) 10/23/2020   Assessment/Plan:  Joseph Villa is a 80 y.o. White or Caucasian [1] male with  has a past medical history of ALLERGIC ASTHMA (11/25/2007), ALLERGIC RHINITIS (11/25/2007), Allergy, Arthritis, lumbar spine (04/18/2011), BIPOLAR DISORDER UNSPECIFIED (10/06/2009), Blood transfusion without reported diagnosis, COLONIC POLYPS, HX OF (10/06/2009), Degenerative arthritis of hip (04/18/2011), DJD (degenerative joint disease), DM w/o Complication Type II (43/73/5789), GERD (10/06/2009), Hemorrhage (yrs ago), Hemorrhoid, History of kidney stones, HYPERLIPIDEMIA (10/06/2009), HYPERTENSION,  BENIGN (07/27/2009), HYPOTHYROIDISM (10/06/2009), NEPHROLITHIASIS, HX OF (10/06/2009), OBSTRUCTIVE SLEEP APNEA (11/25/2007), Peptic stricture of esophagus, RBBB (right bundle branch block), Sleep apnea, and Stroke ([place]).  Hypertension associated with diabetes ([place]) Ok to continue current med tx norvasc only, though will need reassess with next visit when it typically increases again    Acute respiratory failure with hypoxia ([place]) Remains o2 dependent for now, will cont 2L continueous for now, and reasses with next visit to hopefully be able to d/c  COVID-19 virus infection Resolving, hopefully to regain stamina by next visit,  to f/u any worsening symptoms or concerns   AKI (acute kidney injury) ([place]) Improving at end hospn , will need f/u lab - to be done fasting at [facility] lab prior to Jan 4 cardiology f/u per daughter with him today  Type 2 diabetes mellitus ([place]) Also f/u a1c with next labs  Lab Results  Component Value Date   HGBA1C 6.4 (A) 03/19/2021    Followup: No follow-ups on file.  Cathlean Cower, MD 05/30/2021 2:31 PM [facility]

## 2021-05-30 NOTE — Assessment & Plan Note (Signed)
Resolving, hopefully to regain stamina by next visit,  to f/u any worsening symptoms or concerns

## 2021-05-30 NOTE — Patient Instructions (Signed)
Please continue all other medications as before, including the blood pressure and oxygen as you have for now  Please have the pharmacy call with any other refills you may need.  Please continue your efforts at being more active, low cholesterol diet, and weight control.  Please keep your appointments with your specialists as you may have planned  Please go to the LAB at the blood drawing area for the tests to be done - at the [facility] lab at your convenience (fasting)  You will be contacted by phone if any changes need to be made immediately.  Otherwise, you will receive a letter about your results with an explanation, but please check with MyChart first.  Please remember to sign up for MyChart if you have not done so, as this will be important to you in the future with finding out test results, communicating by private email, and scheduling acute appointments online when needed.  Please make an Appointment to return in 1 month, or sooner if needed

## 2021-05-30 NOTE — Assessment & Plan Note (Signed)
Also f/u a1c with next labs  Lab Results  Component Value Date   HGBA1C 6.4 (A) 03/19/2021

## 2021-05-30 NOTE — Assessment & Plan Note (Signed)
Remains o2 dependent for now, will cont 2L continueous for now, and reasses with next visit to hopefully be able to d/c

## 2021-05-30 NOTE — Assessment & Plan Note (Addendum)
Improving at end hospn , will need f/u lab - to be done fasting at [facility] lab prior to Jan 4 cardiology f/u per daughter with him today

## 2021-05-30 NOTE — Assessment & Plan Note (Signed)
Ok to continue current med tx norvasc only, though will need reassess with next visit when it typically increases again

## 2021-06-01 ENCOUNTER — Telehealth: Payer: Self-pay | Admitting: *Deleted

## 2021-06-01 NOTE — Chronic Care Management (AMB) (Signed)
Chronic Care Management   Note  06/01/2021 Name: Joseph Villa MRN: [id number] DOB: [birth year]  Joseph Villa is a 80 y.o. year old male who is a primary care patient of Jenny Reichmann, Hunt Oris, MD. I reached out to Joseph Villa by phone today in response to a referral sent by Mr. Shem Plemmons [facility] PCP.  Mr. Schembri was given information about Chronic Care Management services today including:  CCM service includes personalized support from designated clinical staff supervised by his physician, including individualized plan of care and coordination with other care providers 24/7 contact phone numbers for assistance for urgent and routine care needs. Service will only be billed when office clinical staff spend 20 minutes or more in a month to coordinate care. Only one practitioner may furnish and bill the service in a calendar month. The patient may stop CCM services at any time (effective at the end of the month) by phone call to the office staff. The patient is responsible for co-pay (up to 20% after annual deductible is met) if co-pay is required by the individual health plan.   Spouse Joseph Villa DPR on file  verbally agreed to assistance and services provided by embedded care coordination/care management team today.  Follow up plan: Telephone appointment with care management team member scheduled for:06/11/21  [facility]  Direct Dial: [phone]

## 2021-06-05 ENCOUNTER — Other Ambulatory Visit (INDEPENDENT_AMBULATORY_CARE_PROVIDER_SITE_OTHER): Payer: Medicare Other

## 2021-06-05 DIAGNOSIS — E1122 Type 2 diabetes mellitus with diabetic chronic kidney disease: Secondary | ICD-10-CM | POA: Diagnosis not present

## 2021-06-05 DIAGNOSIS — E559 Vitamin D deficiency, unspecified: Secondary | ICD-10-CM

## 2021-06-05 DIAGNOSIS — E538 Deficiency of other specified B group vitamins: Secondary | ICD-10-CM | POA: Diagnosis not present

## 2021-06-05 DIAGNOSIS — N182 Chronic kidney disease, stage 2 (mild): Secondary | ICD-10-CM | POA: Diagnosis not present

## 2021-06-05 LAB — BASIC METABOLIC PANEL
BUN: 37 mg/dL — ABNORMAL HIGH (ref 6–23)
CO2: 24 mEq/L (ref 19–32)
Calcium: 9.2 mg/dL (ref 8.4–10.5)
Chloride: 103 mEq/L (ref 96–112)
Creatinine, Ser: 1.92 mg/dL — ABNORMAL HIGH (ref 0.40–1.50)
GFR: 33.03 mL/min — ABNORMAL LOW (ref >60.00)
Glucose, Bld: 188 mg/dL — ABNORMAL HIGH (ref 70–99)
Potassium: 4.1 mEq/L (ref 3.5–5.1)
Sodium: 138 mEq/L (ref 135–145)

## 2021-06-05 LAB — HEPATIC FUNCTION PANEL
ALT: 22 U/L (ref 0–53)
AST: 16 U/L (ref 0–37)
Albumin: 3.7 g/dL (ref 3.5–5.2)
Alkaline Phosphatase: 62 U/L (ref 39–117)
Bilirubin, Direct: 0 mg/dL (ref 0.0–0.3)
Total Bilirubin: 0.4 mg/dL (ref 0.2–1.2)
Total Protein: 7.4 g/dL (ref 6.0–8.3)

## 2021-06-05 LAB — URINALYSIS, ROUTINE W REFLEX MICROSCOPIC
Bilirubin Urine: NEGATIVE
Hgb urine dipstick: NEGATIVE
Ketones, ur: NEGATIVE
Leukocytes,Ua: NEGATIVE
Nitrite: NEGATIVE
RBC / HPF: NONE SEEN (ref 0–?)
Specific Gravity, Urine: 1.025 (ref 1.000–1.030)
Total Protein, Urine: 30 — AB
Urine Glucose: >=1000 — AB
Urobilinogen, UA: 0.2 (ref 0.0–1.0)
pH: 5.5 (ref 5.0–8.0)

## 2021-06-05 LAB — CBC WITH DIFFERENTIAL/PLATELET
Basophils Absolute: 0.1 10*3/uL (ref 0.0–0.1)
Basophils Relative: 0.7 % (ref 0.0–3.0)
Eosinophils Absolute: 0.8 10*3/uL — ABNORMAL HIGH (ref 0.0–0.7)
Eosinophils Relative: 9.7 % — ABNORMAL HIGH (ref 0.0–5.0)
HCT: 35.2 % — ABNORMAL LOW (ref 39.0–52.0)
Hemoglobin: 11.4 g/dL — ABNORMAL LOW (ref 13.0–17.0)
Lymphocytes Relative: 23 % (ref 12.0–46.0)
Lymphs Abs: 1.8 10*3/uL (ref 0.7–4.0)
MCHC: 32.2 g/dL (ref 30.0–36.0)
MCV: 90.9 fl (ref 78.0–100.0)
Monocytes Absolute: 0.7 10*3/uL (ref 0.1–1.0)
Monocytes Relative: 8.9 % (ref 3.0–12.0)
Neutro Abs: 4.6 10*3/uL (ref 1.4–7.7)
Neutrophils Relative %: 57.7 % (ref 43.0–77.0)
Platelets: 240 10*3/uL (ref 150.0–400.0)
RBC: 3.88 Mil/uL — ABNORMAL LOW (ref 4.22–5.81)
RDW: 14.1 % (ref 11.5–15.5)
WBC: 7.9 10*3/uL (ref 4.0–10.5)

## 2021-06-05 LAB — HEMOGLOBIN A1C: Hgb A1c MFr Bld: 7.4 % — ABNORMAL HIGH (ref 4.6–6.5)

## 2021-06-05 LAB — LIPID PANEL
Cholesterol: 194 mg/dL (ref 0–200)
HDL: 44.5 mg/dL (ref >39.00)
NonHDL: 149.76
Total CHOL/HDL Ratio: 4
Triglycerides: 242 mg/dL — ABNORMAL HIGH (ref 0.0–149.0)
VLDL: 48.4 mg/dL — ABNORMAL HIGH (ref 0.0–40.0)

## 2021-06-05 LAB — LDL CHOLESTEROL, DIRECT: Direct LDL: 130 mg/dL

## 2021-06-05 LAB — VITAMIN D 25 HYDROXY (VIT D DEFICIENCY, FRACTURES): VITD: 48.52 ng/mL (ref 30.00–100.00)

## 2021-06-05 LAB — TSH: TSH: 2.35 u[IU]/mL (ref 0.35–5.50)

## 2021-06-05 LAB — VITAMIN B12: Vitamin B-12: 1174 pg/mL — ABNORMAL HIGH (ref 211–911)

## 2021-06-06 ENCOUNTER — Other Ambulatory Visit: Payer: Self-pay

## 2021-06-06 ENCOUNTER — Ambulatory Visit (HOSPITAL_BASED_OUTPATIENT_CLINIC_OR_DEPARTMENT_OTHER): Payer: Medicare Other | Admitting: Internal Medicine

## 2021-06-06 ENCOUNTER — Encounter: Payer: Self-pay | Admitting: Internal Medicine

## 2021-06-06 ENCOUNTER — Encounter (HOSPITAL_BASED_OUTPATIENT_CLINIC_OR_DEPARTMENT_OTHER): Payer: Self-pay | Admitting: Internal Medicine

## 2021-06-06 VITALS — BP 122/62 | HR 88 | Ht 71.0 in | Wt 236.0 lb

## 2021-06-06 DIAGNOSIS — T466X5D Adverse effect of antihyperlipidemic and antiarteriosclerotic drugs, subsequent encounter: Secondary | ICD-10-CM | POA: Diagnosis not present

## 2021-06-06 DIAGNOSIS — Z8673 Personal history of transient ischemic attack (TIA), and cerebral infarction without residual deficits: Secondary | ICD-10-CM | POA: Diagnosis not present

## 2021-06-06 DIAGNOSIS — I6522 Occlusion and stenosis of left carotid artery: Secondary | ICD-10-CM

## 2021-06-06 DIAGNOSIS — M791 Myalgia, unspecified site: Secondary | ICD-10-CM | POA: Diagnosis not present

## 2021-06-06 DIAGNOSIS — E785 Hyperlipidemia, unspecified: Secondary | ICD-10-CM

## 2021-06-06 NOTE — Patient Instructions (Signed)
Medication Instructions:  Continue current medications  *If you need a refill on your cardiac medications before your next appointment, please call your pharmacy*   Lab Work: Fasting Lipids  If you have labs (blood work) drawn today and your tests are completely normal, you will receive your results only by: [place] (if you have MyChart) OR A paper copy in the mail If you have any lab test that is abnormal or we need to change your treatment, we will call you to review the results.   Testing/Procedures: None Ordered   Follow-Up: At [facility], you and your health needs are our priority.  As part of our continuing mission to provide you with exceptional heart care, we have created designated Provider Care Teams.  These Care Teams include your primary Cardiologist (physician) and Advanced Practice Providers (APPs -  Physician Assistants and Nurse Practitioners) who all work together to provide you with the care you need, when you need it.  We recommend signing up for the patient portal called "MyChart".  Sign up information is provided on this After Visit Summary.  MyChart is used to connect with patients for Virtual Visits (Telemedicine).  Patients are able to view lab/test results, encounter notes, upcoming appointments, etc.  Non-urgent messages can be sent to your provider as well.   To learn more about what you can do with MyChart, go to [email or web address].    Your next appointment:   3 month(s)  The format for your next appointment:   In Person  Provider:   K. Mali Hilty, MD

## 2021-06-06 NOTE — Progress Notes (Signed)
LIPID CLINIC CONSULT NOTE  Chief Complaint:  Follow-up dyslipidemia  Primary Care Physician: Biagio Borg, MD  Primary Cardiologist:  None  HPI:  Joseph Villa is a 81 y.o. male who is being seen today for the evaluation of dyslipidemia at the request of Biagio Borg, MD. this is a pleasant 81 year old male kindly referred by Dr. Carlis Abbott for evaluation management of dyslipidemia.  Mr. Mineau has a history of stroke in the past in fact was found to have remote strokes on cerebral imaging but recently had an episode of amaurosis fugax.  He was subsequently found to have 80 to 99% left carotid artery stenosis.  He was subsequently seen and underwent TCAR with a successful result.  He does have a history of dyslipidemia and has been statin intolerant in the past having had myalgias.  He currently is on ezetimibe.  His most recent lipids show total cholesterol of 201, triglycerides 230, HDL 37 and LDL 118.  His target LDL should be less than 70.  He does report somewhat of a varied diet but has been trying to reduce saturated fats.  06/06/2021  Mr. Schaller returns today for follow-up.  Unfortunately he was recently hospitalized with severe COVID pneumonia requiring oxygen and it was quite disabling.  He still on portable oxygen.  He reports he has been compliant with Repatha but surprisingly has had minimal reduction in his cholesterol in fact his LDL is actually higher than it was in June.  Direct LDL now 130 total cholesterol 194, triglycerides 242 and HDL 44.  We reviewed the injection protocol and he seems to be doing it appropriately.  The etiology of his of response is not clear but could be due to mutations of PCSK9 or high LP(a) levels.  PMHx:  Past Medical History:  Diagnosis Date   ALLERGIC ASTHMA 11/25/2007   ALLERGIC RHINITIS 11/25/2007   Allergy    Arthritis, lumbar spine 04/18/2011   BIPOLAR DISORDER UNSPECIFIED 10/06/2009   Blood transfusion without reported diagnosis     s/p goiter surgery age 33    COLONIC POLYPS, HX OF 10/06/2009   Degenerative arthritis of hip 04/18/2011   DJD (degenerative joint disease)    DM w/o Complication Type II 47/82/9562   GERD 10/06/2009   Hemorrhage yrs ago   after goiter surgery, tracheostomy inserted and later removed   Hemorrhoid    History of kidney stones    HYPERLIPIDEMIA 10/06/2009   HYPERTENSION, BENIGN 07/27/2009   HYPOTHYROIDISM 10/06/2009   NEPHROLITHIASIS, HX OF 10/06/2009   OBSTRUCTIVE SLEEP APNEA 11/25/2007   cpap setting of 9   Peptic stricture of esophagus    RBBB (right bundle branch block)    Sleep apnea    wears c pap   Stroke [facility])     Past Surgical History:  Procedure Laterality Date   COLONOSCOPY     GANGLION CYST EXCISION     2-3 cysts removed   HERNIA REPAIR     JOINT REPLACEMENT     thyroid Goiter surgery     TONSILLECTOMY     TONSILLECTOMY     TOTAL HIP ARTHROPLASTY  06/16/2012   Procedure: TOTAL HIP ARTHROPLASTY ANTERIOR APPROACH;  Surgeon: Mauri Pole, MD;  Location: [facility];  Service: Orthopedics;  Laterality: Left;   TRANSCAROTID ARTERY REVASCULARIZATION  Left 11/20/2020   Procedure: LEFT TRANSCAROTID ARTERY REVASCULARIZATION;  Surgeon: Marty Heck, MD;  Location: [place];  Service: Vascular;  Laterality: Left;   UMBILICAL HERNIA REPAIR  several yrs ago   UPPER GASTROINTESTINAL ENDOSCOPY      FAMHx:  Family History  Problem Relation Age of Onset   Throat cancer Mother    Alcohol abuse Brother    Diabetes Brother    Drug abuse Daughter    Alcoholism Other        uncle   Breast cancer Father    Colon cancer Neg Hx    Colon polyps Neg Hx    Esophageal cancer Neg Hx    Rectal cancer Neg Hx    Stomach cancer Neg Hx     SOCHx:   reports that he quit smoking about 43 years ago. His smoking use included cigarettes and pipe. He has a 22.50 pack-year smoking history. He has never used smokeless tobacco. He reports that he does not drink alcohol and does not use  drugs.  ALLERGIES:  Allergies  Allergen Reactions   Cefuroxime Axetil Anaphylaxis    REACTION: anaphylaxis-ceftin    Cefprozil     REACTION: unknown   Cephalosporins Nausea Only   Crestor [Rosuvastatin Calcium]     Muscle aches    Lipitor [Atorvastatin]     myalgiaas   Metformin And Related Diarrhea   Rabeprazole Sodium Nausea Only    REACTION: nausea    ROS: Pertinent items noted in HPI and remainder of comprehensive ROS otherwise negative.  HOME MEDS: Current Outpatient Medications on File Prior to Visit  Medication Sig Dispense Refill   Accu-Chek FastClix Lancets MISC USE TWICE DAILY 102 each 2   ACCU-CHEK GUIDE test strip CHECK BLOOD GLUCOSE (SUGAR) TWICE DAILY 200 each 3   albuterol (VENTOLIN HFA) 108 (90 Base) MCG/ACT inhaler Inhale 2 puffs into the lungs every 6 (six) hours as needed for wheezing or shortness of breath. 8.5 g 0   amLODipine (NORVASC) 10 MG tablet Take 1 tablet (10 mg total) by mouth daily. 30 tablet 1   aspirin 81 MG chewable tablet Chew 1 tablet (81 mg total) by mouth daily. 90 tablet 3   benzonatate (TESSALON) 200 MG capsule Take 1 capsule (200 mg total) by mouth 3 (three) times daily as needed for cough. 20 capsule 0   bromocriptine (PARLODEL) 2.5 MG tablet Take 0.5 tablets (1.25 mg total) by mouth daily. 45 tablet 3   carbamazepine (TEGRETOL) 200 MG tablet TAKE 2 TABLETS AT BEDTIME 180 tablet 1   Cholecalciferol (VITAMIN D3) 2000 UNITS TABS Take 2,000 Units by mouth every morning.      clopidogrel (PLAVIX) 75 MG tablet Take 1 tablet (75 mg total) by mouth daily. 30 tablet 3   dapagliflozin propanediol (FARXIGA) 10 MG TABS tablet Take 1 tablet (10 mg total) by mouth daily before breakfast. 30 tablet 6   desoximetasone (TOPICORT) 0.25 % cream Apply 1 application topically 2 (two) times daily. (Patient taking differently: Apply 1 application topically 2 (two) times daily as needed (psoriasis).) 100 g 0   Evolocumab (REPATHA SURECLICK) 195 MG/ML SOAJ  Inject 1 Dose into the skin every 14 (fourteen) days. 2 mL 11   ezetimibe (ZETIA) 10 MG tablet Take 1 tablet (10 mg total) by mouth daily. 30 tablet 2   fluticasone (FLONASE) 50 MCG/ACT nasal spray ONE SPRAY INTO NOSE EVERY DAY (Patient taking differently: Place 2 sprays into both nostrils daily as needed for rhinitis or allergies.) 48 g 1   Glucosamine HCl (GLUCOSAMINE PO) Take 1 tablet by mouth daily.     glucose blood (ACCU-CHEK AVIVA PLUS) test strip Used to check blood sugars  twice a day Dx E11.9 200 each 0   glucose blood test strip Use to check blood sugar 2 times per day. DX Code: E11.9 200 each 2   Lancets Misc. (ACCU-CHEK FASTCLIX LANCET) KIT AS DIRECTED 1 kit 0   levothyroxine (SYNTHROID) 112 MCG tablet TAKE ONE TABLET EACH DAY (Patient taking differently: Take 112 mcg by mouth at bedtime.) 90 tablet 2   Multiple Vitamins-Minerals (PRESERVISION AREDS 2 PO) Take 1 tablet by mouth daily.     nateglinide (STARLIX) 60 MG tablet TAKE ONE-HALF TABLET 3 TIMES DAILY WITH MEALS 135 tablet 3   Omega-3 Fatty Acids (FISH OIL) 1200 MG CAPS Take 1 capsule by mouth daily.     pantoprazole (PROTONIX) 40 MG tablet TAKE ONE TABLET DAILY 90 tablet 0   sitaGLIPtin (JANUVIA) 50 MG tablet Take 1 tablet (50 mg total) by mouth daily. 90 tablet 3   tamsulosin (FLOMAX) 0.4 MG CAPS capsule TAKE ONE CAPSULE EACH DAY (Patient taking differently: Take 0.4 mg by mouth daily. TAKE ONE CAPSULE EACH DAY) 90 capsule 3   TURMERIC CURCUMIN PO Take 500 mg by mouth daily.     vitamin B-12 (CYANOCOBALAMIN) 1000 MCG tablet Take 1,000 mcg by mouth daily.     No current facility-administered medications on file prior to visit.    LABS/IMAGING: Results for orders placed or performed in visit on 06/05/21 (from the past 48 hour(s))  VITAMIN D 25 Hydroxy (Vit-D Deficiency, Fractures)     Status: None   Collection Time: 06/05/21  8:08 AM  Result Value Ref Range   VITD 48.52 30.00 - 100.00 ng/mL  Vitamin B12     Status:  Abnormal   Collection Time: 06/05/21  8:08 AM  Result Value Ref Range   Vitamin B-12 1,174 (H) 211 - 911 pg/mL  Basic metabolic panel     Status: Abnormal   Collection Time: 06/05/21  8:08 AM  Result Value Ref Range   Sodium 138 135 - 145 mEq/L   Potassium 4.1 3.5 - 5.1 mEq/L   Chloride 103 96 - 112 mEq/L   CO2 24 19 - 32 mEq/L   Glucose, Bld 188 (H) 70 - 99 mg/dL   BUN 37 (H) 6 - 23 mg/dL   Creatinine, Ser 1.92 (H) 0.40 - 1.50 mg/dL   GFR 33.03 (L) >60.00 mL/min    Comment: Calculated using the CKD-EPI Creatinine Equation (2021)   Calcium 9.2 8.4 - 10.5 mg/dL  TSH     Status: None   Collection Time: 06/05/21  8:08 AM  Result Value Ref Range   TSH 2.35 0.35 - 5.50 uIU/mL  CBC with Differential/Platelet     Status: Abnormal   Collection Time: 06/05/21  8:08 AM  Result Value Ref Range   WBC 7.9 4.0 - 10.5 K/uL   RBC 3.88 (L) 4.22 - 5.81 Mil/uL   Hemoglobin 11.4 (L) 13.0 - 17.0 g/dL   HCT 35.2 (L) 39.0 - 52.0 %   MCV 90.9 78.0 - 100.0 fl   MCHC 32.2 30.0 - 36.0 g/dL   RDW 14.1 11.5 - 15.5 %   Platelets 240.0 150.0 - 400.0 K/uL   Neutrophils Relative % 57.7 43.0 - 77.0 %   Lymphocytes Relative 23.0 12.0 - 46.0 %   Monocytes Relative 8.9 3.0 - 12.0 %   Eosinophils Relative 9.7 (H) 0.0 - 5.0 %   Basophils Relative 0.7 0.0 - 3.0 %   Neutro Abs 4.6 1.4 - 7.7 K/uL   Lymphs Abs 1.8 0.7 -  4.0 K/uL   Monocytes Absolute 0.7 0.1 - 1.0 K/uL   Eosinophils Absolute 0.8 (H) 0.0 - 0.7 K/uL   Basophils Absolute 0.1 0.0 - 0.1 K/uL  Hepatic function panel     Status: None   Collection Time: 06/05/21  8:08 AM  Result Value Ref Range   Total Bilirubin 0.4 0.2 - 1.2 mg/dL   Bilirubin, Direct 0.0 0.0 - 0.3 mg/dL   Alkaline Phosphatase 62 39 - 117 U/L   AST 16 0 - 37 U/L   ALT 22 0 - 53 U/L   Total Protein 7.4 6.0 - 8.3 g/dL   Albumin 3.7 3.5 - 5.2 g/dL  Lipid panel     Status: Abnormal   Collection Time: 06/05/21  8:08 AM  Result Value Ref Range   Cholesterol 194 0 - 200 mg/dL     Comment: ATP III Classification       Desirable:  < 200 mg/dL               Borderline High:  200 - 239 mg/dL          High:  > = 240 mg/dL   Triglycerides 242.0 (H) 0.0 - 149.0 mg/dL    Comment: Normal:  <150 mg/dLBorderline High:  150 - 199 mg/dL   HDL 44.50 >39.00 mg/dL   VLDL 48.4 (H) 0.0 - 40.0 mg/dL   Total CHOL/HDL Ratio 4     Comment:                Men          Women1/2 Average Risk     3.4          3.3Average Risk          5.0          4.42X Average Risk          9.6          7.13X Average Risk          15.0          11.0                       NonHDL 149.76     Comment: NOTE:  Non-HDL goal should be 30 mg/dL higher than patient's LDL goal (i.e. LDL goal of < 70 mg/dL, would have non-HDL goal of < 100 mg/dL)  Hemoglobin A1c     Status: Abnormal   Collection Time: 06/05/21  8:08 AM  Result Value Ref Range   Hgb A1c MFr Bld 7.4 (H) 4.6 - 6.5 %    Comment: Glycemic Control Guidelines for People with Diabetes:Non Diabetic:  <6%Goal of Therapy: <7%Additional Action Suggested:  >8%   LDL cholesterol, direct     Status: None   Collection Time: 06/05/21  8:08 AM  Result Value Ref Range   Direct LDL 130.0 mg/dL    Comment: Optimal:  <100 mg/dLNear or Above Optimal:  100-129 mg/dLBorderline High:  130-159 mg/dLHigh:  160-189 mg/dLVery High:  >190 mg/dL  Urinalysis, Routine w reflex microscopic     Status: Abnormal   Collection Time: 06/05/21  9:07 AM  Result Value Ref Range   Color, Urine YELLOW Yellow;Lt. Yellow;Straw;Dark Yellow;Amber;Green;Red;Brown   APPearance Sl Cloudy (A) Clear;Turbid;Slightly Cloudy;Cloudy   Specific Gravity, Urine 1.025 1.000 - 1.030   pH 5.5 5.0 - 8.0   Total Protein, Urine 30 (A) Negative   Urine Glucose >=1000 (A) Negative   Ketones, ur NEGATIVE Negative  Bilirubin Urine NEGATIVE Negative   Hgb urine dipstick NEGATIVE Negative   Urobilinogen, UA 0.2 0.0 - 1.0   Leukocytes,Ua NEGATIVE Negative   Nitrite NEGATIVE Negative   WBC, UA 0-2/hpf 0-2/hpf   RBC /  HPF none seen 0-2/hpf   Mucus, UA Presence of (A) None   Squamous Epithelial / LPF Rare(0-4/hpf) Rare(0-4/hpf)   Granular Casts, UA Presence of (A) None   Hyaline Casts, UA Presence of (A) None   No results found.  LIPID PANEL:    Component Value Date/Time   CHOL 194 06/05/2021 0808   TRIG 242.0 (H) 06/05/2021 0808   HDL 44.50 06/05/2021 0808   CHOLHDL 4 06/05/2021 0808   VLDL 48.4 (H) 06/05/2021 0808   LDLCALC 118 (H) 11/21/2020 0428   LDLDIRECT 130.0 06/05/2021 0808    WEIGHTS: Wt Readings from Last 3 Encounters:  06/06/21 236 lb (107 kg)  05/30/21 235 lb (106.6 kg)  05/12/21 250 lb (113.4 kg)    VITALS: BP 122/62    Pulse 88    Ht '5\' 11"'  (1.803 m)    Wt 236 lb (107 kg)    SpO2 96%    BMI 32.92 kg/m   EXAM: Deferred  EKG: N/A  ASSESSMENT: Mixed dyslipidemia, goal LDL less than 70 History of stroke Bilateral carotid artery stenosis status post left TCAR Type 2 diabetes Hypertension Statin intolerant-myalgias  PLAN: 1.   Mr. Fiallos has unfortunately not had a significant response to Repatha.  His direct LDL is about 80 points lower than it was 7 months ago, but less than predicted.  This may have been because of complications related to COVID-19 or perhaps steroids while he was hospitalized.  At this point I think we will go ahead and allow him to recover more and repeat lipids in about 3 to 4 months.  If he is persistently elevated, we could consider possibly switching to Praluent or trying other options for lipid-lowering.  Follow-up with me at that time.  Pixie Casino, MD, [facility], [place] Director of the [facility] Lipid Disorders &  Card[facility] Diplomate of the American Board of Clinical Lipidology Attending Cardiologist  Direct Dial: [phone]   Fax: [phone]  Website:  [email or web address] 06/06/2021, 4:22 PM

## 2021-06-06 NOTE — Progress Notes (Signed)
[facility]

## 2021-06-11 ENCOUNTER — Other Ambulatory Visit (HOSPITAL_COMMUNITY): Payer: Self-pay

## 2021-06-11 ENCOUNTER — Ambulatory Visit (INDEPENDENT_AMBULATORY_CARE_PROVIDER_SITE_OTHER): Payer: Medicare Other | Admitting: *Deleted

## 2021-06-11 DIAGNOSIS — E1122 Type 2 diabetes mellitus with diabetic chronic kidney disease: Secondary | ICD-10-CM

## 2021-06-11 DIAGNOSIS — I152 Hypertension secondary to endocrine disorders: Secondary | ICD-10-CM

## 2021-06-11 DIAGNOSIS — N182 Chronic kidney disease, stage 2 (mild): Secondary | ICD-10-CM

## 2021-06-11 DIAGNOSIS — U071 COVID-19: Secondary | ICD-10-CM

## 2021-06-11 NOTE — Chronic Care Management (AMB) (Signed)
Chronic Care Management   CCM RN Visit Note  06/11/2021 Name: Joseph Villa MRN: [id number] DOB: 1941-02-11  Subjective: Joseph Villa is a 81 y.o. year old male who is a primary care patient of Biagio Borg, MD. The care management team was consulted for assistance with disease management and care coordination needs.    Engaged with patient and his spouse Arbie Cookey by telephone for initial visit in response to provider referral for case management and/or care coordination services. Patient provides verbal consent today for me to speak to his wife Arbie Cookey "now and at any time in the future" if necessary  Consent to Services:  The patient was given information about Chronic Care Management services, agreed to services, and gave verbal consent 06/01/22 prior to initiation of services.  Please see initial visit note for detailed documentation.  Patient agreed to services and verbal consent obtained.   Assessment: Review of patient past medical history, allergies, medications, health status, including review of consultants reports, laboratory and other test data, was performed as part of comprehensive evaluation and provision of chronic care management services.   SDOH (Social Determinants of Health) assessments and interventions performed:  SDOH Interventions    Flowsheet Row Most Recent Value  SDOH Interventions   Food Insecurity Interventions Intervention Not Indicated  [Denies food insecurity]  Financial Strain Interventions Intervention Not Indicated  Housing Interventions Intervention Not Indicated  [Single Family home, one level, lived at same home x 40 years,  lives with spouse only,  2 adult local daughters that are RN's]  Transportation Interventions Intervention Not Indicated  [Wife provides transportation while patient recuperating from [place],  patient normally drives self- hoping to resume driving "soon"]      CCM Care Plan  Allergies  Allergen Reactions   Cefuroxime Axetil  Anaphylaxis    REACTION: anaphylaxis-ceftin    Cefprozil     REACTION: unknown   Cephalosporins Nausea Only   Crestor [Rosuvastatin Calcium]     Muscle aches    Lipitor [Atorvastatin]     myalgiaas   Metformin And Related Diarrhea   Rabeprazole Sodium Nausea Only    REACTION: nausea   Outpatient Encounter Medications as of 06/11/2021  Medication Sig   Accu-Chek FastClix Lancets MISC USE TWICE DAILY   ACCU-CHEK GUIDE test strip CHECK BLOOD GLUCOSE (SUGAR) TWICE DAILY   albuterol (VENTOLIN HFA) 108 (90 Base) MCG/ACT inhaler Inhale 2 puffs into the lungs every 6 (six) hours as needed for wheezing or shortness of breath.   amLODipine (NORVASC) 10 MG tablet Take 1 tablet (10 mg total) by mouth daily.   aspirin 81 MG chewable tablet Chew 1 tablet (81 mg total) by mouth daily.   benzonatate (TESSALON) 200 MG capsule Take 1 capsule (200 mg total) by mouth 3 (three) times daily as needed for cough.   bromocriptine (PARLODEL) 2.5 MG tablet Take 0.5 tablets (1.25 mg total) by mouth daily.   carbamazepine (TEGRETOL) 200 MG tablet TAKE 2 TABLETS AT BEDTIME   Cholecalciferol (VITAMIN D3) 2000 UNITS TABS Take 2,000 Units by mouth every morning.    clopidogrel (PLAVIX) 75 MG tablet Take 1 tablet (75 mg total) by mouth daily.   dapagliflozin propanediol (FARXIGA) 10 MG TABS tablet Take 1 tablet (10 mg total) by mouth daily before breakfast.   desoximetasone (TOPICORT) 0.25 % cream Apply 1 application topically 2 (two) times daily. (Patient taking differently: Apply 1 application topically 2 (two) times daily as needed (psoriasis).)   Evolocumab (REPATHA SURECLICK) 004 MG/ML SOAJ  Inject 1 Dose into the skin every 14 (fourteen) days.   ezetimibe (ZETIA) 10 MG tablet Take 1 tablet (10 mg total) by mouth daily.   fluticasone (FLONASE) 50 MCG/ACT nasal spray ONE SPRAY INTO NOSE EVERY DAY (Patient taking differently: Place 2 sprays into both nostrils daily as needed for rhinitis or allergies.)   Glucosamine  HCl (GLUCOSAMINE PO) Take 1 tablet by mouth daily.   glucose blood (ACCU-CHEK AVIVA PLUS) test strip Used to check blood sugars twice a day Dx E11.9   glucose blood test strip Use to check blood sugar 2 times per day. DX Code: E11.9   Lancets Misc. (ACCU-CHEK FASTCLIX LANCET) KIT AS DIRECTED   levothyroxine (SYNTHROID) 112 MCG tablet TAKE ONE TABLET EACH DAY (Patient taking differently: Take 112 mcg by mouth at bedtime.)   Multiple Vitamins-Minerals (PRESERVISION AREDS 2 PO) Take 1 tablet by mouth daily.   nateglinide (STARLIX) 60 MG tablet TAKE ONE-HALF TABLET 3 TIMES DAILY WITH MEALS   Omega-3 Fatty Acids (FISH OIL) 1200 MG CAPS Take 1 capsule by mouth daily.   pantoprazole (PROTONIX) 40 MG tablet TAKE ONE TABLET DAILY   sitaGLIPtin (JANUVIA) 50 MG tablet Take 1 tablet (50 mg total) by mouth daily.   tamsulosin (FLOMAX) 0.4 MG CAPS capsule TAKE ONE CAPSULE EACH DAY (Patient taking differently: Take 0.4 mg by mouth daily. TAKE ONE CAPSULE EACH DAY)   TURMERIC CURCUMIN PO Take 500 mg by mouth daily.   vitamin B-12 (CYANOCOBALAMIN) 1000 MCG tablet Take 1,000 mcg by mouth daily.   No facility-administered encounter medications on file as of 06/11/2021.   Patient Active Problem List   Diagnosis Date Noted   Acute respiratory failure with hypoxia ([facility]) 05/12/2021   COVID-19 virus infection 05/12/2021   AKI (acute kidney injury) ([place]) 05/12/2021   Left carotid artery stenosis 11/20/2020   Amaurosis fugax of left eye 11/08/2020   Hyperlipidemia associated with type 2 diabetes mellitus ([place]) 11/08/2020   Statin myopathy 05/22/2020   Eczema 11/13/2018   Rash 11/13/2018   Great toe pain, right 11/01/2017   Chronic low back pain 05/02/2017   Chest pain 10/23/2016   CKD (chronic kidney disease), stage III ([place]) 04/19/2016   BPH (benign prostatic hypertrophy) with urinary obstruction 10/20/2015   Type 2 diabetes mellitus ([place]) 10/07/2015   Impingement syndrome of left shoulder 02/10/2015   Cough  07/09/2013   Positional vertigo 06/30/2012   Expected blood loss anemia 06/17/2012   Obese 06/17/2012   S/P left THA, AA 06/16/2012   Preop exam for internal medicine 06/04/2012   Peripheral neuropathy 12/21/2011   Anxiety 12/20/2011   Left hip pain 04/18/2011   Degenerative arthritis of hip 04/18/2011   Arthritis, lumbar spine 04/18/2011   Hemorrhoids, external 04/18/2011   Encounter for well adult exam with abnormal findings 12/23/2010   Hypothyroidism 10/06/2009   Hyperlipidemia 10/06/2009   BIPOLAR DISORDER UNSPECIFIED 10/06/2009   Hypertension associated with diabetes ([place]) 10/06/2009   GERD 10/06/2009   COLONIC POLYPS, HX OF 10/06/2009   NEPHROLITHIASIS, HX OF 10/06/2009   Obstructive sleep apnea 11/25/2007   Seasonal and perennial allergic rhinitis 11/25/2007   Allergic-infective asthma 11/25/2007   Conditions to be addressed/monitored:  HTN, DMII, and recent [place] [facility] [place] [facility]  Updates made by Knox Royalty, RN since 06/11/2021 12:00 AM     Problem: Chronic Disease Management Needs   Priority: High     Long-Range Goal: Development of plan of care for long term  chronic disease management   Start Date: 06/11/2021  Expected End Date: 06/11/2022  Priority: High  Note:   Current Barriers:  Chronic Disease Management support and education needs related to HTN, DMII, and Recent COVID infection, requiring hospitalization Hospitalized December 10-16, 2022 for Acute respiratory failure due to covid; AKI- discharged home to self-care with new home O2 orders New to home oxygen  RNCM Clinical Goal(s):  Patient will demonstrate ongoing health management independence as evidenced by aherence to plan of care for DMII/ HTN/ post-Covid infection        through collaboration with RN Care manager, provider, and care team.   Interventions: 1:1 collaboration with primary care provider regarding development and update of comprehensive plan of care as  evidenced by provider attestation and co-signature Inter-disciplinary care team collaboration (see longitudinal plan of care) Evaluation of current treatment plan related to  self management and patient's adherence to plan as established by provider 06/11/21- Initial assessment completed Reviewed recent hospitalization with patient and his spouse: verbalize good understanding of plan of care post-hospital discharge Pain assessment completed: patient denies acute/ chronic pain Falls assessment completed: patient low risk for falls; denies falls x last 12 months, does not use assistive devices  COVID:  (Status: New goal.) Long Term Goal  Provided education to patient to enhance basic understanding of COVID-19 as a viral disease, measures to prevent exposure, signs and symptoms, recommended vaccine schedule, when to contact provider Counseled on importance of regular laboratory monitoring as prescribed Screening for signs and symptoms of depression Discussed effects, symptoms, and management of "long COVID"-- patient reports ongoing slow recuperation, but reports "feels a little better every day" Assessed social determinant of health barriers Reviewed recent PCP and cardiology provider office visit with patient and spouse- they verbalize a very good understanding of post-provider office visit Confirmed patient and spouse jointly manage medications, independently; they deny medication questions, concerns today  Confirmed patient continuing to use home O2 at 2 L/min; intermittently during day and with CPAP at night; reports improvement in SaO2 levels recently- trending in low-mid 90's; hopeful to be able to completely wean off home O2 "eventually;" endorses uses CPAP at night as prescribed- denies issues with home O2/ CPAP  Diabetes:  (Status: New goal.) Long Term Goal   Lab Results  Component Value Date   HGBA1C 7.4 (H) 06/05/2021  Assessed patient's understanding of A1c goal: <7% Provided  education to patient about basic DM disease process; Reviewed prescribed diet with patient heart healthy, low salt, low carbohydrate, low sugar; Counseled on importance of regular laboratory monitoring as prescribed;        Discussed plans with patient for ongoing care management follow up and provided patient with direct contact information for care management team;      Reviewed scheduled/upcoming provider appointments including: 07/02/21- PCP; 09/21/21- endocrinology provider; 10/10/21- cardiology/ Dr. Debara Pickett;         Advised patient, providing education and rationale, to check cbg once daily and record        Review of patient status, including review of consultants reports, relevant laboratory and other test results, and medications completed;       Reviewed recent blood sugars with patient: confirms he has been monitoring/ writing down on paper fasting blood sugars QD: he reports initially high blood sugars post- hospital discharge; states there are starting to "level off;" reports general fasting values between 150-170, which have improved with completion of prednisone therapy; reports fasting blood sugar this morning of "144" Discussed medications  for diabetes: he reports Farxiga increased during prednisone therapy up to 10 mg po QD: he has continued this dose since hospital discharge- denies low blood sugars; discussed signs/ symptoms low blood sugar, along with corresponding action plan for same: will provide printed educational material as well Briefly discussed dietary strategies for blood sugar management at home: patient and spouse endorse adherence to prescribed diet  Patient Goals/Self-Care Activities: As evidenced by review of EHR, collaboration with care team, and patient reporting during CCM RN CM outreach, Patient Gaje will: Take medications as prescribed Attend all scheduled provider appointments Call pharmacy for medication refills Call provider office for new concerns or  questions Continue to check fasting (first thing in the morning, before eating) blood sugars at home Continue to follow heart healthy, low salt, low cholesterol, carbohydrate-modified, low sugar diet Continue using your oxygen and CPAP as prescribed Keep up the great work at home preventing falls!      Plan: Telephone follow up appointment with care management team member scheduled for:  Tuesday, July 10, 2021 at 3:00 pm The patient has been provided with contact information for the care management team and has been advised to call with any health related questions or concerns  Oneta Rack, RN, BSN, [place] [phone]: direct office

## 2021-06-11 NOTE — Patient Instructions (Signed)
Visit Information   Joseph Villa, thank you for taking time to talk with me today. Please don't hesitate to contact me if I can be of assistance to you before our next scheduled telephone appointment.  Below are the goals we discussed today:  Patient Self-Care Activities: Patient Joseph Villa will: Take medications as prescribed Attend all scheduled provider appointments Call pharmacy for medication refills Call provider office for new concerns or questions Continue to check fasting (first thing in the morning, before eating) blood sugars at home Continue to follow heart healthy, low salt, low cholesterol, carbohydrate-modified, low sugar diet Continue using your oxygen and CPAP as prescribed Keep up the great work at home preventing falls!  Our next scheduled telephone follow up visit/ appointment with care management team member is scheduled on:  Tuesday, July 10, 2021 at 3:00 pm  If you need to cancel or re-schedule our visit, please call [phone] and our care guide team will be happy to assist you.   I look forward to hearing about your progress.   Joseph Rack, RN, BSN, [place] [phone]: direct office  If you are experiencing a Mental Health or [place] or need someone to talk to, please  call the Suicide and Crisis Lifeline: 988 call the [place] National Suicide Prevention Lifeline: (270)831-7682 or TTY: 262 663 8724 TTY (701)876-3948) to talk to a trained counselor call 1-800-273-TALK (toll free, 24 hour hotline) go to [facility] [place] 970-311-9986) call 911   Preventing Hypoglycemia Hypoglycemia occurs when the level of sugar (glucose) in the blood is too low. Hypoglycemia can happen in people who do or do not have diabetes (diabetes mellitus). It can develop quickly, and it can be a medical emergency. For most people with  diabetes, a blood glucose level below 70 mg/dL (3.9 mmol/L) is considered hypoglycemia. Glucose is a type of sugar that provides the body's main source of energy. Certain hormones (insulin and glucagon) control the level of glucose in the blood. Insulin lowers blood glucose, and glucagon increases blood glucose. Hypoglycemia can result from having too much insulin in the bloodstream, or from not eating enough food that contains glucose. Your risk for hypoglycemia is higher: If you take insulin or diabetes medicines to help lower your blood glucose or to help your body make more insulin. If you skip or delay a meal or snack. If you are ill. During and after exercise. You can prevent hypoglycemia by working with your health care provider to adjust your meal plan as needed and by taking other precautions. How can hypoglycemia affect me? Mild symptoms Mild hypoglycemia may not cause any symptoms. If you do have symptoms, they may include: Hunger. Sweating and feeling clammy. Dizziness or feeling light-headed. Sleepiness or restless sleep. Nausea. Increased heart rate. Headache. Blurry vision. Mood changes, including irritability or anxiety. Tingling or numbness around the mouth, lips, or tongue. If mild hypoglycemia is not recognized and treated, it can quickly become moderate or severe hypoglycemia. Moderate symptoms Moderate hypoglycemia can cause: Confusion and poor judgment. Behavior changes. Weakness. Irregular heartbeat. A change in coordination. Severe symptoms Severe hypoglycemia is a medical emergency. It can cause: Fainting. Seizures. Loss of consciousness (coma). Death. What nutrition changes can be made? Work with your health care provider or dietitian to make a healthy meal plan that is right for you. Follow your meal plan carefully. Eat meals at regular times. If recommended by your  health care provider, have snacks between meals. Donot skip or delay meals or snacks.  You can be at risk for hypoglycemia if you are not getting enough carbohydrates. What lifestyle changes can be made?  Work closely with your health care provider to manage your blood glucose. Make sure you know: Your goal blood glucose levels. How and when to check your blood glucose. The symptoms of hypoglycemia. It is important to treat hypoglycemia right away to keep it from becoming severe. Do not drink alcohol on an empty stomach. When you are ill, check your blood glucose more often than usual. Make a sick day plan in advance with your health care provider. Follow this plan whenever you cannot eat or drink normally. Always check your blood glucose before, during, and after exercise. How is this treated? This condition can often be treated by immediately eating or drinking something that contains sugar with 15 grams of fast-acting carbohydrate, such as: 4 oz (120 mL) of fruit juice. 4 oz (120 mL) of regular soda (not diet soda). Several pieces of hard candy. Check food labels to find out how many pieces to eat for 15 grams. 1 Tbsp (15 mL) of sugar or honey. 4 glucose tablets. 1 tube of glucose gel. Treating hypoglycemia if you have diabetes If you are alert and able to swallow safely, follow the 15:15 rule: Take 15 grams of a fast-acting carbohydrate. Talk with your health care provider about how much you should take. Fast-acting options include: Glucose tablets (take 4 tablets). Several pieces of hard candy. Check food labels to find out how many pieces to eat for 15 grams. 4 oz (120 mL) of fruit juice. 4 oz (120 mL) of regular soda (not diet soda). 1 Tbsp (15 mL) of sugar or honey. 1 tube of glucose gel. Check your blood glucose 15 minutes after you take the carbohydrate. If the repeat blood glucose level is still at or below 70 mg/dL (3.9 mmol/L), take 15 grams of a carbohydrate again. If your blood glucose level does not increase above 70 mg/dL (3.9 mmol/L) after 3 tries, seek  emergency medical care. After your blood glucose level returns to normal, eat a meal or a snack within 1 hour. Treating severe hypoglycemia Severe hypoglycemia is when your blood glucose level is below 54 mg/dL (3 mmol/L). Severe hypoglycemia is a medical emergency. Get medical help right away. If you have severe hypoglycemia and you cannot eat or drink, you may need glucagon. A family member or close friend should learn how to check your blood glucose and how to give you glucagon. Ask your health care provider if you need to have an emergency glucagon kit available. Severe hypoglycemia may need to be treated in a hospital. The treatment may include getting glucose through an IV. You may also need treatment for the cause of your hypoglycemia. Where to find more information American Diabetes Association: www.diabetes.[employer] of Diabetes and Digestive and Kidney Diseases: [employer] Association of Diabetes Care & Education Specialists: www.diabeteseducator.org Contact a health care provider if: You have problems keeping your blood glucose in your target range. You have frequent episodes of hypoglycemia. Get help right away if: You continue to have hypoglycemia symptoms after eating or drinking something containing glucose. Your blood glucose level is below 54 mg/dL (3 mmol/L). You faint. You have a seizure. These symptoms may represent a serious problem that is an emergency. Do not wait to see if the symptoms will go away. Get medical help right away. Call  your local emergency services (911 in the [place] Do not drive yourself to the hospital. Summary Know the symptoms of hypoglycemia and when you are at risk for it, such as during exercise or when you are sick. Check your blood glucose often when you are at risk for hypoglycemia. Hypoglycemia can develop quickly, and it can be dangerous if it is not treated right away. If you have a history of severe hypoglycemia, make sure  your family or a close friend knows how to use your glucagon kit. Make sure you know how to treat hypoglycemia. Keep a fast-acting carbohydrate option available when you may be at risk for hypoglycemia. This information is not intended to replace advice given to you by your health care provider. Make sure you discuss any questions you have with your health care provider. Document Revised: 04/20/2020 Document Reviewed: 04/20/2020 Elsevier Patient Education  2022 [employer].  Following is a copy of your full care plan:  Care Plan : RN Care Manager Plan of Care  Updates made by Joseph Royalty, RN since 06/11/2021 12:00 AM     Problem: Chronic Disease Management Needs   Priority: High     Long-Range Goal: Development of plan of care for long term chronic disease management   Start Date: 06/11/2021  Expected End Date: 06/11/2022  Priority: High  Note:   Current Barriers:  Chronic Disease Management support and education needs related to HTN, DMII, and Recent COVID infection, requiring hospitalization Hospitalized December 10-16, 2022 for Acute respiratory failure due to covid; AKI- discharged home to self-care with new home O2 orders New to home oxygen  RNCM Clinical Goal(s):  Patient will demonstrate ongoing health management independence as evidenced by aherence to plan of care for DMII/ HTN/ post-Covid infection        through collaboration with RN Care manager, provider, and care team.   Interventions: 1:1 collaboration with primary care provider regarding development and update of comprehensive plan of care as evidenced by provider attestation and co-signature Inter-disciplinary care team collaboration (see longitudinal plan of care) Evaluation of current treatment plan related to  self management and patient's adherence to plan as established by provider 06/11/21- Initial assessment completed Reviewed recent hospitalization with patient and his spouse: verbalize good understanding of  plan of care post-hospital discharge Pain assessment completed: patient denies acute/ chronic pain Falls assessment completed: patient low risk for falls; denies falls x last 12 months, does not use assistive devices  COVID:  (Status: New goal.) Long Term Goal  Provided education to patient to enhance basic understanding of COVID-19 as a viral disease, measures to prevent exposure, signs and symptoms, recommended vaccine schedule, when to contact provider Counseled on importance of regular laboratory monitoring as prescribed Screening for signs and symptoms of depression Discussed effects, symptoms, and management of "long COVID"-- patient reports ongoing slow recuperation, but reports "feels a little better every day" Assessed social determinant of health barriers Reviewed recent PCP and cardiology provider office visit with patient and spouse- they verbalize a very good understanding of post-provider office visit Confirmed patient and spouse jointly manage medications, independently; they deny medication questions, concerns today  Confirmed patient continuing to use home O2 at 2 L/min; intermittently during day and with CPAP at night; reports improvement in SaO2 levels recently- trending in low-mid 90's; hopeful to be able to completely wean off home O2 "eventually;" endorses uses CPAP at night as prescribed- denies issues with home O2/ CPAP  Diabetes:  (Status: New goal.) Long Term  Goal   Lab Results  Component Value Date   HGBA1C 7.4 (H) 06/05/2021  Assessed patient's understanding of A1c goal: <7% Provided education to patient about basic DM disease process; Reviewed prescribed diet with patient heart healthy, low salt, low carbohydrate, low sugar; Counseled on importance of regular laboratory monitoring as prescribed;        Discussed plans with patient for ongoing care management follow up and provided patient with direct contact information for care management team;      Reviewed  scheduled/upcoming provider appointments including: 07/02/21- PCP; 09/21/21- endocrinology provider; 10/10/21- cardiology/ Dr. Debara Pickett;         Advised patient, providing education and rationale, to check cbg once daily and record        Review of patient status, including review of consultants reports, relevant laboratory and other test results, and medications completed;       Reviewed recent blood sugars with patient: confirms he has been monitoring/ writing down on paper fasting blood sugars QD: he reports initially high blood sugars post- hospital discharge; states there are starting to "level off;" reports general fasting values between 150-170, which have improved with completion of prednisone therapy; reports fasting blood sugar this morning of "144" Discussed medications for diabetes: he reports Farxiga increased during prednisone therapy up to 10 mg po QD: he has continued this dose since hospital discharge- denies low blood sugars; discussed signs/ symptoms low blood sugar, along with corresponding action plan for same: will provide printed educational material as well Briefly discussed dietary strategies for blood sugar management at home: patient and spouse endorse adherence to prescribed diet  Patient Goals/Self-Care Activities: As evidenced by review of EHR, collaboration with care team, and patient reporting during CCM RN CM outreach, Patient Nisaiah will: Take medications as prescribed Attend all scheduled provider appointments Call pharmacy for medication refills Call provider office for new concerns or questions Continue to check fasting (first thing in the morning, before eating) blood sugars at home Continue to follow heart healthy, low salt, low cholesterol, carbohydrate-modified, low sugar diet Continue using your oxygen and CPAP as prescribed Keep up the great work at home preventing falls!      Consent to CCM Services: Joseph Villa was given information about Chronic Care  Management services 06/01/22 including:  CCM service includes personalized support from designated clinical staff supervised by his physician, including individualized plan of care and coordination with other care providers 24/7 contact phone numbers for assistance for urgent and routine care needs. Service will only be billed when office clinical staff spend 20 minutes or more in a month to coordinate care. Only one practitioner may furnish and bill the service in a calendar month. The patient may stop CCM services at any time (effective at the end of the month) by phone call to the office staff. The patient will be responsible for cost sharing (co-pay) of up to 20% of the service fee (after annual deductible is met).  Patient agreed to services and verbal consent obtained.   The patient verbalized understanding of instructions, educational materials, and care plan provided today and agreed to receive a mailed copy of patient instructions, educational materials, and care plan Telephone follow up appointment with care management team member scheduled for:  Tuesday, July 10, 2021 at 3:00 pm The patient has been provided with contact information for the care management team and has been advised to call with any health related questions or concerns

## 2021-06-18 DIAGNOSIS — U071 COVID-19: Secondary | ICD-10-CM | POA: Diagnosis not present

## 2021-07-02 ENCOUNTER — Encounter: Payer: Self-pay | Admitting: Internal Medicine

## 2021-07-02 ENCOUNTER — Ambulatory Visit (INDEPENDENT_AMBULATORY_CARE_PROVIDER_SITE_OTHER): Payer: Medicare Other

## 2021-07-02 ENCOUNTER — Other Ambulatory Visit: Payer: Self-pay

## 2021-07-02 ENCOUNTER — Ambulatory Visit (INDEPENDENT_AMBULATORY_CARE_PROVIDER_SITE_OTHER): Payer: Medicare Other | Admitting: Internal Medicine

## 2021-07-02 VITALS — BP 120/50 | HR 79 | Temp 98.2°F | Ht 71.0 in | Wt 236.0 lb

## 2021-07-02 DIAGNOSIS — N182 Chronic kidney disease, stage 2 (mild): Secondary | ICD-10-CM | POA: Diagnosis not present

## 2021-07-02 DIAGNOSIS — U071 COVID-19: Secondary | ICD-10-CM | POA: Diagnosis not present

## 2021-07-02 DIAGNOSIS — E1122 Type 2 diabetes mellitus with diabetic chronic kidney disease: Secondary | ICD-10-CM

## 2021-07-02 DIAGNOSIS — I152 Hypertension secondary to endocrine disorders: Secondary | ICD-10-CM | POA: Diagnosis not present

## 2021-07-02 DIAGNOSIS — J9601 Acute respiratory failure with hypoxia: Secondary | ICD-10-CM | POA: Diagnosis not present

## 2021-07-02 DIAGNOSIS — E785 Hyperlipidemia, unspecified: Secondary | ICD-10-CM

## 2021-07-02 DIAGNOSIS — Z0001 Encounter for general adult medical examination with abnormal findings: Secondary | ICD-10-CM

## 2021-07-02 DIAGNOSIS — J9611 Chronic respiratory failure with hypoxia: Secondary | ICD-10-CM

## 2021-07-02 DIAGNOSIS — Z1211 Encounter for screening for malignant neoplasm of colon: Secondary | ICD-10-CM

## 2021-07-02 DIAGNOSIS — N1832 Chronic kidney disease, stage 3b: Secondary | ICD-10-CM | POA: Diagnosis not present

## 2021-07-02 DIAGNOSIS — E1169 Type 2 diabetes mellitus with other specified complication: Secondary | ICD-10-CM | POA: Diagnosis not present

## 2021-07-02 DIAGNOSIS — E1159 Type 2 diabetes mellitus with other circulatory complications: Secondary | ICD-10-CM

## 2021-07-02 DIAGNOSIS — J9690 Respiratory failure, unspecified, unspecified whether with hypoxia or hypercapnia: Secondary | ICD-10-CM | POA: Diagnosis not present

## 2021-07-02 NOTE — Progress Notes (Signed)
Patient ID: Joseph Villa, male   DOB: 05/17/1941, 81 y.o.   MRN: [id number]         Chief Complaint:: wellness exam and recent acute resp failure on home o2 2L       HPI:  Joseph Villa is a 81 y.o. male here for wellness exam; declines covid booster, shingrix, and tdap; o/w up to date                        Also, s/p recent hospn with d/c dec 16 post covid pna requiring home o2 2L at d/c continuous  Pt denies chest pain, increased sob or doe, wheezing, orthopnea, PND, increased LE swelling, palpitations, dizziness or syncope.   Pt denies polydipsia, polyuria, or new focal neuro s/s.   Pt denies fever, wt loss, night sweats, loss of appetite, or other constitutional symptoms  Wife is nurse and willing to check home ONO on her own to further assess after amb o2 sat today in office is adequate at 92% on RA        Wt Readings from Last 3 Encounters:  07/02/21 236 lb (107 kg)  06/06/21 236 lb (107 kg)  05/30/21 235 lb (106.6 kg)   BP Readings from Last 3 Encounters:  07/02/21 (!) 120/50  06/06/21 122/62  05/30/21 128/62   Immunization History  Administered Date(s) Administered   Fluad Quad(high Dose 65+) 02/19/2019, 04/15/2020, 03/09/2021   H1N1 05/09/2008   Influenza Split 03/21/2011, 03/13/2012   Influenza Whole 03/03/2009, 03/08/2010   Influenza, High Dose Seasonal PF 03/17/2015, 03/12/2016, 03/19/2017, 03/02/2018   Influenza,inj,Quad PF,6+ Mos 04/05/2013, 02/15/2014   PFIZER(Purple Top)SARS-COV-2 Vaccination 09/02/2019, 10/02/2019, 05/18/2020   Pneumococcal Conjugate-13 10/13/2013   Pneumococcal Polysaccharide-23 06/04/2007, 10/31/2017   Td 06/04/2007   Tdap 03/21/2011   Health Maintenance Due  Topic Date Due   COLONOSCOPY (Pts 45-22yr Insurance coverage will need to be confirmed)  12/19/2020      Past Medical History:  Diagnosis Date   ALLERGIC ASTHMA 11/25/2007   ALLERGIC RHINITIS 11/25/2007   Allergy    Arthritis, lumbar spine 04/18/2011   BIPOLAR DISORDER  UNSPECIFIED 10/06/2009   Blood transfusion without reported diagnosis    s/p goiter surgery age 81   COLONIC POLYPS, HX OF 10/06/2009   Degenerative arthritis of hip 04/18/2011   DJD (degenerative joint disease)    DM w/o Complication Type II 154/62/7035  GERD 10/06/2009   Hemorrhage yrs ago   after goiter surgery, tracheostomy inserted and later removed   Hemorrhoid    History of kidney stones    HYPERLIPIDEMIA 10/06/2009   HYPERTENSION, BENIGN 07/27/2009   HYPOTHYROIDISM 10/06/2009   NEPHROLITHIASIS, HX OF 10/06/2009   OBSTRUCTIVE SLEEP APNEA 11/25/2007   cpap setting of 9   Peptic stricture of esophagus    RBBB (right bundle branch block)    Sleep apnea    wears c pap   Stroke ([facility]    Past Surgical History:  Procedure Laterality Date   COLONOSCOPY     GANGLION CYST EXCISION     2-3 cysts removed   HERNIA REPAIR     JOINT REPLACEMENT     thyroid Goiter surgery     TONSILLECTOMY     TONSILLECTOMY     TOTAL HIP ARTHROPLASTY  06/16/2012   Procedure: TOTAL HIP ARTHROPLASTY ANTERIOR APPROACH;  Surgeon: MMauri Pole MD;  Location: [facility];  Service: Orthopedics;  Laterality: Left;   TRANSCAROTID ARTERY REVASCULARIZATION  Left  11/20/2020   Procedure: LEFT TRANSCAROTID ARTERY REVASCULARIZATION;  Surgeon: Marty Heck, MD;  Location: [place];  Service: Vascular;  Laterality: Left;   UMBILICAL HERNIA REPAIR  several yrs ago   UPPER GASTROINTESTINAL ENDOSCOPY      reports that he quit smoking about 44 years ago. His smoking use included cigarettes and pipe. He has a 22.50 pack-year smoking history. He has never used smokeless tobacco. He reports that he does not drink alcohol and does not use drugs. family history includes Alcohol abuse in his brother; Alcoholism in an other family member; Breast cancer in his father; Diabetes in his brother; Drug abuse in his daughter; Throat cancer in his mother. Allergies  Allergen Reactions   Cefuroxime Axetil Anaphylaxis     REACTION: anaphylaxis-ceftin    Cefprozil     REACTION: unknown   Cephalosporins Nausea Only   Crestor [Rosuvastatin Calcium]     Muscle aches    Lipitor [Atorvastatin]     myalgiaas   Metformin And Related Diarrhea   Rabeprazole Sodium Nausea Only    REACTION: nausea   Current Outpatient Medications on File Prior to Visit  Medication Sig Dispense Refill   Accu-Chek FastClix Lancets MISC USE TWICE DAILY 102 each 2   ACCU-CHEK GUIDE test strip CHECK BLOOD GLUCOSE (SUGAR) TWICE DAILY 200 each 3   albuterol (VENTOLIN HFA) 108 (90 Base) MCG/ACT inhaler Inhale 2 puffs into the lungs every 6 (six) hours as needed for wheezing or shortness of breath. 8.5 g 0   amLODipine (NORVASC) 10 MG tablet Take 1 tablet (10 mg total) by mouth daily. 30 tablet 1   aspirin 81 MG chewable tablet Chew 1 tablet (81 mg total) by mouth daily. 90 tablet 3   benzonatate (TESSALON) 200 MG capsule Take 1 capsule (200 mg total) by mouth 3 (three) times daily as needed for cough. 20 capsule 0   bromocriptine (PARLODEL) 2.5 MG tablet Take 0.5 tablets (1.25 mg total) by mouth daily. 45 tablet 3   carbamazepine (TEGRETOL) 200 MG tablet TAKE 2 TABLETS AT BEDTIME 180 tablet 1   Cholecalciferol (VITAMIN D3) 2000 UNITS TABS Take 2,000 Units by mouth every morning.      clopidogrel (PLAVIX) 75 MG tablet Take 1 tablet (75 mg total) by mouth daily. 30 tablet 3   dapagliflozin propanediol (FARXIGA) 10 MG TABS tablet Take 1 tablet (10 mg total) by mouth daily before breakfast. 30 tablet 6   desoximetasone (TOPICORT) 0.25 % cream Apply 1 application topically 2 (two) times daily. (Patient taking differently: Apply 1 application topically 2 (two) times daily as needed (psoriasis).) 100 g 0   Evolocumab (REPATHA SURECLICK) 122 MG/ML SOAJ Inject 1 Dose into the skin every 14 (fourteen) days. 2 mL 11   ezetimibe (ZETIA) 10 MG tablet Take 1 tablet (10 mg total) by mouth daily. 30 tablet 2   fluticasone (FLONASE) 50 MCG/ACT nasal spray  ONE SPRAY INTO NOSE EVERY DAY (Patient taking differently: Place 2 sprays into both nostrils daily as needed for rhinitis or allergies.) 48 g 1   Glucosamine HCl (GLUCOSAMINE PO) Take 1 tablet by mouth daily.     glucose blood (ACCU-CHEK AVIVA PLUS) test strip Used to check blood sugars twice a day Dx E11.9 200 each 0   glucose blood test strip Use to check blood sugar 2 times per day. DX Code: E11.9 200 each 2   ketoconazole (NIZORAL) 2 % cream Apply topically daily.     Lancets Misc. (ACCU-CHEK FASTCLIX LANCET) KIT  AS DIRECTED 1 kit 0   levothyroxine (SYNTHROID) 112 MCG tablet TAKE ONE TABLET EACH DAY (Patient taking differently: Take 112 mcg by mouth at bedtime.) 90 tablet 2   Multiple Vitamins-Minerals (PRESERVISION AREDS 2 PO) Take 1 tablet by mouth daily.     nateglinide (STARLIX) 60 MG tablet TAKE ONE-HALF TABLET 3 TIMES DAILY WITH MEALS 135 tablet 3   Omega-3 Fatty Acids (FISH OIL) 1200 MG CAPS Take 1 capsule by mouth daily.     pantoprazole (PROTONIX) 40 MG tablet TAKE ONE TABLET DAILY 90 tablet 0   PROCTO-MED HC 2.5 % rectal cream Place rectally 2 (two) times daily.     sitaGLIPtin (JANUVIA) 50 MG tablet Take 1 tablet (50 mg total) by mouth daily. 90 tablet 3   tamsulosin (FLOMAX) 0.4 MG CAPS capsule TAKE ONE CAPSULE EACH DAY (Patient taking differently: Take 0.4 mg by mouth daily. TAKE ONE CAPSULE EACH DAY) 90 capsule 3   TURMERIC CURCUMIN PO Take 500 mg by mouth daily.     vitamin B-12 (CYANOCOBALAMIN) 1000 MCG tablet Take 1,000 mcg by mouth daily.     No current facility-administered medications on file prior to visit.        ROS:  All others reviewed and negative.  Objective        PE:  BP (!) 120/50 (BP Location: Left Arm, Patient Position: Sitting, Cuff Size: Large)    Pulse 79    Temp 98.2 F (36.8 C) (Oral)    Ht '5\' 11"'  (1.803 m)    Wt 236 lb (107 kg)    SpO2 95%    BMI 32.92 kg/m                 Constitutional: Pt appears in NAD               HENT: Head: NCAT.                 Right Ear: External ear normal.                 Left Ear: External ear normal.                Eyes: . Pupils are equal, round, and reactive to light. Conjunctivae and EOM are normal               Nose: without d/c or deformity               Neck: Neck supple. Gross normal ROM               Cardiovascular: Normal rate and regular rhythm.                 Pulmonary/Chest: Effort normal and breath sounds without rales or wheezing.                Abd:  Soft, NT, ND, + BS, no organomegaly               Neurological: Pt is alert. At baseline orientation, motor grossly intact               Skin: Skin is warm. No rashes, no other new lesions, LE edema - none               Psychiatric: Pt behavior is normal without agitation   Micro: none  Cardiac tracings I have personally interpreted today:  none  Pertinent Radiological findings (summarize): none   Lab Results  Component Value Date  WBC 7.9 06/05/2021   HGB 11.4 (L) 06/05/2021   HCT 35.2 (L) 06/05/2021   PLT 240.0 06/05/2021   GLUCOSE 188 (H) 06/05/2021   CHOL 194 06/05/2021   TRIG 242.0 (H) 06/05/2021   HDL 44.50 06/05/2021   LDLDIRECT 130.0 06/05/2021   LDLCALC 118 (H) 11/21/2020   ALT 22 06/05/2021   AST 16 06/05/2021   NA 138 06/05/2021   K 4.1 06/05/2021   CL 103 06/05/2021   CREATININE 1.92 (H) 06/05/2021   BUN 37 (H) 06/05/2021   CO2 24 06/05/2021   TSH 2.35 06/05/2021   PSA 0.84 10/23/2020   INR 1.0 11/16/2020   HGBA1C 7.4 (H) 06/05/2021   MICROALBUR 13.3 (H) 10/23/2020   Assessment/Plan:  Joseph Villa is a 81 y.o. White or Caucasian [1] male with  has a past medical history of ALLERGIC ASTHMA (11/25/2007), ALLERGIC RHINITIS (11/25/2007), Allergy, Arthritis, lumbar spine (04/18/2011), BIPOLAR DISORDER UNSPECIFIED (10/06/2009), Blood transfusion without reported diagnosis, COLONIC POLYPS, HX OF (10/06/2009), Degenerative arthritis of hip (04/18/2011), DJD (degenerative joint disease), DM w/o Complication Type II  (68/04/5725), GERD (10/06/2009), Hemorrhage (yrs ago), Hemorrhoid, History of kidney stones, HYPERLIPIDEMIA (10/06/2009), HYPERTENSION, BENIGN (07/27/2009), HYPOTHYROIDISM (10/06/2009), NEPHROLITHIASIS, HX OF (10/06/2009), OBSTRUCTIVE SLEEP APNEA (11/25/2007), Peptic stricture of esophagus, RBBB (right bundle branch block), Sleep apnea, and Stroke ([place]).  COVID-19 virus infection Essentially resolved with persistent non prod cough,  to f/u any worsening symptoms or concerns  Hyperlipidemia associated with type 2 diabetes mellitus ([place]) Lab Results  Component Value Date   LDLCALC 118 (H) 11/21/2020   Mild uncontrolled pt to continue current zetia as has been statin intolerant   Hypertension associated with diabetes ([place]) BP Readings from Last 3 Encounters:  07/02/21 (!) 120/50  06/06/21 122/62  05/30/21 128/62   Stable, pt to continue medical treatment norvasc   Type 2 diabetes mellitus ([place]) Lab Results  Component Value Date   HGBA1C 7.4 (H) 06/05/2021   Mild uncontrolled, goal a1c < 7,, pt to continue current medical treatment farxiga, januvia, starlix as declines change, to f'u endo as planned   Acute respiratory failure with hypoxia ([place]) Post covid pna dec 2022 ; ok for d/c home o2 during daytime, wife to further check pt with sleep ONO with home oximetry with continue home o2 2L qhs only for now; also for f/u cxr  Encounter for well adult exam with abnormal findings Age and sex appropriate education and counseling updated with regular exercise and diet Referrals for preventative services - for colonoscopy Immunizations addressed - declines shingrix, covid booster and tdap Smoking counseling  - none needed Evidence for depression or other mood disorder - none significant Most recent labs reviewed. I have personally reviewed and have noted: 1) the patient's medical and social history 2) The patient's current medications and supplements 3) The patient's height, weight,  and BMI have been recorded in the chart   CKD (chronic kidney disease), stage III ([place]) Lab Results  Component Value Date   CREATININE 1.92 (H) 06/05/2021   Stable overall, cont to avoid nephrotoxins  Followup: No follow-ups on file.  Cathlean Cower, MD 07/08/2021 9:44 PM [facility]

## 2021-07-02 NOTE — Patient Instructions (Addendum)
You will be contacted regarding the referral for: colonoscopy  Your oxygen level was OK today with walking without the oxygen - ok to STOP the daytime oxygen  Please continue to monitor the oxygen at home on CPAP only, as you may still need the oygen at night as well  Please continue all other medications as before, and refills have been done if requested.  Please have the pharmacy call with any other refills you may need.  Please continue your efforts at being more active, low cholesterol diet, and weight control.  Please keep your appointments with your specialists as you may have planned  Please go to the XRAY Department in the first floor for the x-ray testing  You will be contacted by phone if any changes need to be made immediately.  Otherwise, you will receive a letter about your results with an explanation, but please check with MyChart first.  Please remember to sign up for MyChart if you have not done so, as this will be important to you in the future with finding out test results, communicating by private email, and scheduling acute appointments online when needed.  Please make an Appointment to return in 6 months, or sooner if needed, also with Lab Appointment for testing done 3-5 days before at the [facility] (so this is for TWO appointments - please see the scheduling desk as you leave)  Due to the ongoing Covid 19 pandemic, our lab now requires an appointment for any labs done at our office.  If you need labs done and do not have an appointment, please call our office ahead of time to schedule before presenting to the lab for your testing.

## 2021-07-03 ENCOUNTER — Telehealth: Payer: Self-pay | Admitting: Internal Medicine

## 2021-07-03 DIAGNOSIS — I152 Hypertension secondary to endocrine disorders: Secondary | ICD-10-CM | POA: Diagnosis not present

## 2021-07-03 DIAGNOSIS — E1122 Type 2 diabetes mellitus with diabetic chronic kidney disease: Secondary | ICD-10-CM

## 2021-07-03 DIAGNOSIS — E1159 Type 2 diabetes mellitus with other circulatory complications: Secondary | ICD-10-CM | POA: Diagnosis not present

## 2021-07-03 DIAGNOSIS — N182 Chronic kidney disease, stage 2 (mild): Secondary | ICD-10-CM | POA: Diagnosis not present

## 2021-07-03 NOTE — Telephone Encounter (Signed)
PA for repatha was generated in [facility] Attempted to submit Message received:  This medication or product was previously approved on [id number] from 2021-02-01 to 2022-06-02.

## 2021-07-08 NOTE — Assessment & Plan Note (Signed)
BP Readings from Last 3 Encounters:  07/02/21 (!) 120/50  06/06/21 122/62  05/30/21 128/62   Stable, pt to continue medical treatment norvasc

## 2021-07-08 NOTE — Assessment & Plan Note (Signed)
Lab Results  Component Value Date   HGBA1C 7.4 (H) 06/05/2021   Mild uncontrolled, goal a1c < 7,, pt to continue current medical treatment farxiga, januvia, starlix as declines change, to f'u endo as planned

## 2021-07-08 NOTE — Assessment & Plan Note (Signed)
Lab Results  Component Value Date   CREATININE 1.92 (H) 06/05/2021   Stable overall, cont to avoid nephrotoxins

## 2021-07-08 NOTE — Assessment & Plan Note (Signed)
Essentially resolved with persistent non prod cough,  to f/u any worsening symptoms or concerns

## 2021-07-08 NOTE — Assessment & Plan Note (Signed)
Lab Results  Component Value Date   LDLCALC 118 (H) 11/21/2020   Mild uncontrolled pt to continue current zetia as has been statin intolerant

## 2021-07-08 NOTE — Assessment & Plan Note (Addendum)
Post covid pna dec 2022 ; ok for d/c home o2 during daytime, wife to further check pt with sleep ONO with home oximetry with continue home o2 2L qhs only for now; also for f/u cxr

## 2021-07-08 NOTE — Assessment & Plan Note (Addendum)
Age and sex appropriate education and counseling updated with regular exercise and diet Referrals for preventative services - for colonoscopy Immunizations addressed - declines shingrix, covid booster and tdap Smoking counseling  - none needed Evidence for depression or other mood disorder - none significant Most recent labs reviewed. I have personally reviewed and have noted: 1) the patient's medical and social history 2) The patient's current medications and supplements 3) The patient's height, weight, and BMI have been recorded in the chart

## 2021-07-10 ENCOUNTER — Telehealth: Payer: Self-pay | Admitting: *Deleted

## 2021-07-10 ENCOUNTER — Encounter: Payer: Self-pay | Admitting: *Deleted

## 2021-07-10 ENCOUNTER — Telehealth: Payer: Medicare Other

## 2021-07-10 NOTE — Telephone Encounter (Signed)
°  Chronic Care Management   Follow Up Note   07/10/2021 Name: Joseph Villa MRN: [id number] DOB: 02/23/1941  Referred by: Biagio Borg, MD Reason for referral : Chronic Care Management (CCM RN CM Follow Up Visit; COVID; DMII; Unsuccessful attempt x 2)  An unsuccessful telephone outreach was attempted today. The patient was referred to the case management team for assistance with care management and care coordination.   Attempted call x 2 to patient for today's scheduled follow up telephone visit: received automated outgoing voice message with both attempts stating that patient's voicemail box is full; unable to leave patient voice message requesting call back  Follow Up Plan:   Will place request with scheduling care guide to contact patient to re-schedule today's missed CCM RN follow up telephone appointment if I do not hear back from patient by end of day  Oneta Rack, RN, BSN, [place] [phone]: direct office

## 2021-07-12 ENCOUNTER — Other Ambulatory Visit: Payer: Self-pay | Admitting: Internal Medicine

## 2021-07-12 NOTE — Telephone Encounter (Signed)
Please refill as per office routine med refill policy (all routine meds to be refilled for 3 mo or monthly (per pt preference) up to one year from last visit, then month to month grace period for 3 mo, then further med refills will have to be denied) ? ?

## 2021-07-13 ENCOUNTER — Other Ambulatory Visit: Payer: Self-pay | Admitting: Internal Medicine

## 2021-07-13 NOTE — Telephone Encounter (Signed)
Please refill as per office routine med refill policy (all routine meds to be refilled for 3 mo or monthly (per pt preference) up to one year from last visit, then month to month grace period for 3 mo, then further med refills will have to be denied) ? ?

## 2021-07-16 ENCOUNTER — Other Ambulatory Visit: Payer: Self-pay | Admitting: Internal Medicine

## 2021-07-16 NOTE — Telephone Encounter (Signed)
Please refill as per office routine med refill policy (all routine meds to be refilled for 3 mo or monthly (per pt preference) up to one year from last visit, then month to month grace period for 3 mo, then further med refills will have to be denied) ? ?

## 2021-07-19 DIAGNOSIS — U071 COVID-19: Secondary | ICD-10-CM | POA: Diagnosis not present

## 2021-07-30 ENCOUNTER — Telehealth: Payer: Self-pay | Admitting: *Deleted

## 2021-07-30 NOTE — Chronic Care Management (AMB) (Signed)
°  Care Management   Note  07/30/2021 Name: Joseph Villa MRN: [id number] DOB: 06/05/1940  Joseph Villa is a 81 y.o. year old male who is a primary care patient of Biagio Borg, MD and is actively engaged with the care management team. I reached out to Joseph Villa by phone today to assist with re-scheduling a follow up visit with the RN Case Manager  Follow up plan: Unsuccessful telephone outreach attempt made. The care management team will reach out to the patient again over the next 7 days. If patient returns call to provider office, please advise to call [place]  at [phone].  [facility] Management  Direct Dial: [phone]

## 2021-07-31 ENCOUNTER — Telehealth: Payer: Self-pay

## 2021-07-31 DIAGNOSIS — G4733 Obstructive sleep apnea (adult) (pediatric): Secondary | ICD-10-CM | POA: Diagnosis not present

## 2021-07-31 DIAGNOSIS — J9601 Acute respiratory failure with hypoxia: Secondary | ICD-10-CM

## 2021-07-31 NOTE — Telephone Encounter (Signed)
Ok for home ambulatory o2 sat testing, as well as overnight oximtry test,  thanks

## 2021-07-31 NOTE — Telephone Encounter (Signed)
Pt wife is calling requesting something be faxed to [place] so they can come pick up the Oxygen that he was released from the hospital with.   [facility] states that they will have to have something from the Dr stating he doesn't need it anymore  Pt CB [phone]

## 2021-07-31 NOTE — Telephone Encounter (Signed)
Col[place]ta [place] w/ [facility] inquiring if provider wants an overnight oximetry test administered on pt before picking up oxygen tank  Rep states if oximetry test isn't needed, a discontinue letter from provider is needed  Phone [phone]  Fax [phone]

## 2021-08-01 NOTE — Telephone Encounter (Signed)
Ok done hardcopy to cma 

## 2021-08-01 NOTE — Telephone Encounter (Signed)
Overnight O2 oximetry test ordered faxed to [facility] on 08/01/2021 ?

## 2021-08-06 NOTE — Chronic Care Management (AMB) (Signed)
?  Care Management  ? ?Note ? ?08/06/2021 ?Name: JAVARION DOUTY MRN: [id number] DOB: 1940-06-25 ? ?PRATIK DALZIEL is a 81 y.o. year old male who is a primary care patient of Biagio Borg, MD and is actively engaged with the care management team. I reached out to [name] by phone today to assist with re-scheduling a follow up visit with the RN Case Manager ? ?Follow up plan: ?Unsuccessful telephone outreach attempt made. The care management team will reach out to the patient again over the next 7 days. If patient returns call to provider office, please advise to call [place] at [phone]. ? ?Laverda Sorenson  ?Care Guide, Embedded Care Coordination ?[facility]  Care Management  ?Direct Dial: [phone] ? ?

## 2021-08-09 ENCOUNTER — Encounter: Payer: Self-pay | Admitting: Internal Medicine

## 2021-08-10 ENCOUNTER — Telehealth: Payer: Self-pay

## 2021-08-10 NOTE — Telephone Encounter (Signed)
Unable to reach patient regarding oximetry testing  ?

## 2021-08-10 NOTE — Telephone Encounter (Signed)
-----   Message from Biagio Borg, MD sent at 08/09/2021  5:00 PM EST ----- ?Please to let pt and family know - we received his ONO (overnight oximetry) testing which shows he needs to continue to wear the oxygen at night as the oxygen falls low at night.   ?

## 2021-08-13 NOTE — Chronic Care Management (AMB) (Signed)
?  Care Management  ? ?Note ? ?08/13/2021 ?Name: [name] MRN: [id number] DOB: 02-Jun-1941 ? ?Joseph Villa is a 81 y.o. year old male who is a primary care patient of Biagio Borg, MD and is actively engaged with the care management team. I reached out to [name] by phone today to assist with re-scheduling a follow up visit with the RN Case Manager ? ?Follow up plan: ?A third unsuccessful telephone outreach attempt made. Unable to make contact on outreach attempts x 3. PCP Dr. Jenny Reichmann notified via routed documentation in medical record. We have been unable to make contact with the patient for follow up. The care management team is available to follow up with the patient after provider conversation with the patient regarding recommendation for care management engagement and subsequent re-referral to the care management team.  ? ?Laverda Sorenson  ?Care Guide, Embedded Care Coordination ?[place]  Care Management  ?Direct Dial: [phone] ? ?

## 2021-08-14 ENCOUNTER — Other Ambulatory Visit: Payer: Self-pay

## 2021-08-14 ENCOUNTER — Ambulatory Visit (INDEPENDENT_AMBULATORY_CARE_PROVIDER_SITE_OTHER): Payer: Medicare Other | Admitting: Endocrinology

## 2021-08-14 ENCOUNTER — Encounter: Payer: Self-pay | Admitting: Endocrinology

## 2021-08-14 VITALS — BP 126/64 | HR 74 | Ht 71.0 in | Wt 249.0 lb

## 2021-08-14 DIAGNOSIS — N182 Chronic kidney disease, stage 2 (mild): Secondary | ICD-10-CM

## 2021-08-14 DIAGNOSIS — E1122 Type 2 diabetes mellitus with diabetic chronic kidney disease: Secondary | ICD-10-CM

## 2021-08-14 LAB — POCT GLYCOSYLATED HEMOGLOBIN (HGB A1C): Hemoglobin A1C: 6.5 % — AB (ref 4.0–5.6)

## 2021-08-14 NOTE — Patient Instructions (Addendum)
Please continue the same 4 diabetes medications.   ?check your blood sugar once a day.  vary the time of day when you check, between before the 3 meals, and at bedtime.  also check if you have symptoms of your blood sugar being too high or too low.  please keep a record of the readings and bring it to your next appointment here (or you can bring the meter itself).  You can write it on any piece of paper.  please call us sooner if your blood sugar goes below 70, or if you have a lot of readings over 200.   ?Please come back for a follow-up appointment in 6 months.   ?

## 2021-08-14 NOTE — Progress Notes (Signed)
? ?Subjective:  ? ? Patient ID: Joseph Villa, male    DOB: [birth year], 81 y.o.   MRN: [id number] ? ?HPI ?Pt returns for f/u of diabetes mellitus:  ?DM type: 2 ?Dx'ed: 2007.   ?Complications: PN, PAD, and stage 3 CRI.   ?Therapy: 4 oral meds.   ?DKA: never.   ?Severe hypoglycemia: never.  ?Pancreatitis: never.  ?Other: he has never been on insulin, but he has learned how; he did not tolerate metformin-XR (diarrhea); he cannot take pioglitizone, due to edema; renal insufficiency also limits rx options. He did not tolerate repaglinide (constipation).    ?Interval history: He brings his meter with his cbg's which I have reviewed today.  CBG varies from 116-163.  pt states he feels well in general.  Pt says he takes meds as rx'ed.  He had COVID 12/22.   ?Past Medical History:  ?Diagnosis Date  ? ALLERGIC ASTHMA 11/25/2007  ? ALLERGIC RHINITIS 11/25/2007  ? Allergy   ? Arthritis, lumbar spine 04/18/2011  ? BIPOLAR DISORDER UNSPECIFIED 10/06/2009  ? Blood transfusion without reported diagnosis   ? s/p goiter surgery age 75   ? COLONIC POLYPS, HX OF 10/06/2009  ? Degenerative arthritis of hip 04/18/2011  ? DJD (degenerative joint disease)   ? DM w/o Complication Type II 70/17/7939  ? GERD 10/06/2009  ? Hemorrhage yrs ago  ? after goiter surgery, tracheostomy inserted and later removed  ? Hemorrhoid   ? History of kidney stones   ? HYPERLIPIDEMIA 10/06/2009  ? HYPERTENSION, BENIGN 07/27/2009  ? HYPOTHYROIDISM 10/06/2009  ? NEPHROLITHIASIS, HX OF 10/06/2009  ? OBSTRUCTIVE SLEEP APNEA 11/25/2007  ? cpap setting of 9  ? Peptic stricture of esophagus   ? RBBB (right bundle branch block)   ? Sleep apnea   ? wears c pap  ? Stroke [facility])   ? ? ?Past Surgical History:  ?Procedure Laterality Date  ? COLONOSCOPY    ? GANGLION CYST EXCISION    ? 2-3 cysts removed  ? HERNIA REPAIR    ? JOINT REPLACEMENT    ? thyroid Goiter surgery    ? TONSILLECTOMY    ? TONSILLECTOMY    ? TOTAL HIP ARTHROPLASTY  06/16/2012  ? Procedure: TOTAL HIP  ARTHROPLASTY ANTERIOR APPROACH;  Surgeon: Mauri Pole, MD;  Location: [facility];  Service: Orthopedics;  Laterality: Left;  ? TRANSCAROTID ARTERY REVASCULARIZATION?  Left 11/20/2020  ? Procedure: LEFT TRANSCAROTID ARTERY REVASCULARIZATION;  Surgeon: Marty Heck, MD;  Location: [place];  Service: Vascular;  Laterality: Left;  ? UMBILICAL HERNIA REPAIR  several yrs ago  ? UPPER GASTROINTESTINAL ENDOSCOPY    ? ? ?Social History  ? ?Socioeconomic History  ? Marital status: Married  ?  Spouse name: Not on file  ? Number of children: 3  ? Years of education: Not on file  ? Highest education level: Not on file  ?Occupational History  ? Occupation: retired  ?Tobacco Use  ? Smoking status: Former  ?  Packs/day: 1.50  ?  Years: 15.00  ?  Pack years: 22.50  ?  Types: Cigarettes, Pipe  ?  Quit date: 06/23/1977  ?  Years since quitting: 44.1  ? Smokeless tobacco: Never  ?Vaping Use  ? Vaping Use: Never used  ?Substance and Sexual Activity  ? Alcohol use: No  ?  Alcohol/week: 0.0 standard drinks  ? Drug use: No  ? Sexual activity: Not Currently  ?Other Topics Concern  ? Not on file  ?Social History Narrative  ?  Not on file  ? ?Social Determinants of Health  ? ?Financial Resource Strain: Low Risk   ? Difficulty of Paying Living Expenses: Not hard at all  ?Food Insecurity: No Food Insecurity  ? Worried About Charity fundraiser in the Last Year: Never true  ? Ran Out of Food in the Last Year: Never true  ?Transportation Needs: No Transportation Needs  ? Lack of Transportation (Medical): No  ? Lack of Transportation (Non-Medical): No  ?Physical Activity: Not on file  ?Stress: Not on file  ?Social Connections: Not on file  ?Intimate Partner Violence: Not on file  ? ? ?Current Outpatient Medications on File Prior to Visit  ?Medication Sig Dispense Refill  ? Accu-Chek FastClix Lancets MISC USE TWICE DAILY 102 each 2  ? ACCU-CHEK GUIDE test strip CHECK BLOOD GLUCOSE (SUGAR) TWICE DAILY 200 each 3  ? albuterol (VENTOLIN HFA) 108 (90  Base) MCG/ACT inhaler Inhale 2 puffs into the lungs every 6 (six) hours as needed for wheezing or shortness of breath. 8.5 g 0  ? amLODipine (NORVASC) 10 MG tablet TAKE ONE TABLET EACH DAY 90 tablet 3  ? aspirin 81 MG chewable tablet Chew 1 tablet (81 mg total) by mouth daily. 90 tablet 3  ? benzonatate (TESSALON) 200 MG capsule Take 1 capsule (200 mg total) by mouth 3 (three) times daily as needed for cough. 20 capsule 0  ? bromocriptine (PARLODEL) 2.5 MG tablet Take 0.5 tablets (1.25 mg total) by mouth daily. 45 tablet 3  ? carbamazepine (TEGRETOL) 200 MG tablet TAKE 2 TABLETS AT BEDTIME 180 tablet 1  ? Cholecalciferol (VITAMIN D3) 2000 UNITS TABS Take 2,000 Units by mouth every morning.     ? clopidogrel (PLAVIX) 75 MG tablet Take 1 tablet (75 mg total) by mouth daily. 30 tablet 3  ? dapagliflozin propanediol (FARXIGA) 10 MG TABS tablet Take 1 tablet (10 mg total) by mouth daily before breakfast. 30 tablet 6  ? desoximetasone (TOPICORT) 0.25 % cream Apply 1 application topically 2 (two) times daily. (Patient taking differently: Apply 1 application. topically 2 (two) times daily as needed (psoriasis).) 100 g 0  ? Evolocumab (REPATHA SURECLICK) 242 MG/ML SOAJ Inject 1 Dose into the skin every 14 (fourteen) days. 2 mL 11  ? ezetimibe (ZETIA) 10 MG tablet Take 1 tablet (10 mg total) by mouth daily. 30 tablet 2  ? fluticasone (FLONASE) 50 MCG/ACT nasal spray ONE SPRAY INTO NOSE EVERY DAY (Patient taking differently: Place 2 sprays into both nostrils daily as needed for rhinitis or allergies.) 48 g 1  ? Glucosamine HCl (GLUCOSAMINE PO) Take 1 tablet by mouth daily.    ? glucose blood (ACCU-CHEK AVIVA PLUS) test strip Used to check blood sugars twice a day Dx E11.9 200 each 0  ? glucose blood test strip Use to check blood sugar 2 times per day. DX Code: E11.9 200 each 2  ? ketoconazole (NIZORAL) 2 % cream Apply topically daily.    ? Lancets Misc. (ACCU-CHEK FASTCLIX LANCET) KIT AS DIRECTED 1 kit 0  ? levothyroxine  (SYNTHROID) 112 MCG tablet TAKE ONE TABLET EACH DAY (Patient taking differently: Take 112 mcg by mouth at bedtime.) 90 tablet 2  ? Multiple Vitamins-Minerals (PRESERVISION AREDS 2 PO) Take 1 tablet by mouth daily.    ? nateglinide (STARLIX) 60 MG tablet TAKE ONE-HALF TABLET 3 TIMES DAILY WITH MEALS 135 tablet 3  ? Omega-3 Fatty Acids (FISH OIL) 1200 MG CAPS Take 1 capsule by mouth daily.    ?  pantoprazole (PROTONIX) 40 MG tablet TAKE ONE TABLET DAILY 90 tablet 2  ? PROCTO-MED HC 2.5 % rectal cream Place rectally 2 (two) times daily.    ? sitaGLIPtin (JANUVIA) 50 MG tablet Take 1 tablet (50 mg total) by mouth daily. 90 tablet 3  ? tamsulosin (FLOMAX) 0.4 MG CAPS capsule TAKE ONE CAPSULE EACH DAY 90 capsule 3  ? TURMERIC CURCUMIN PO Take 500 mg by mouth daily.    ? vitamin B-12 (CYANOCOBALAMIN) 1000 MCG tablet Take 1,000 mcg by mouth daily.    ? ?No current facility-administered medications on file prior to visit.  ? ? ?Allergies  ?Allergen Reactions  ? Cefuroxime Axetil Anaphylaxis  ?  REACTION: anaphylaxis-ceftin ?  ? Cefprozil   ?  REACTION: unknown  ? Cephalosporins Nausea Only  ? Crestor [Rosuvastatin Calcium]   ?  Muscle aches   ? Lipitor [Atorvastatin]   ?  myalgiaas  ? Metformin And Related Diarrhea  ? Rabeprazole Sodium Nausea Only  ?  REACTION: nausea  ? ? ?Family History  ?Problem Relation Age of Onset  ? Throat cancer Mother   ? Alcohol abuse Brother   ? Diabetes Brother   ? Drug abuse Daughter   ? Alcoholism Other   ?     uncle  ? Breast cancer Father   ? Colon cancer Neg Hx   ? Colon polyps Neg Hx   ? Esophageal cancer Neg Hx   ? Rectal cancer Neg Hx   ? Stomach cancer Neg Hx   ? ? ?BP 126/64 (BP Location: Left Arm, Patient Position: Sitting, Cuff Size: Normal)   Pulse 74   Ht '5\' 11"'  (1.803 m)   Wt 249 lb (112.9 kg)   SpO2 95%   BMI 34.73 kg/m?  ? ? ?Review of Systems ?He denies hypoglycemia.   ?   ?Objective:  ? Physical Exam ?VITAL SIGNS:  See vs page.   ?GENERAL: no distress.   ? ? ?Lab Results   ?Component Value Date  ? HGBA1C 6.5 (A) 08/14/2021  ? ?   ?Assessment & Plan:  ?Type 2 DM: well-controlled.   ? ?Patient Instructions  ?Please continue the same 4 diabetes medications.   ?check your blood sugar o

## 2021-08-20 ENCOUNTER — Other Ambulatory Visit: Payer: Self-pay | Admitting: Internal Medicine

## 2021-08-20 NOTE — Telephone Encounter (Signed)
Please refill as per office routine med refill policy (all routine meds to be refilled for 3 mo or monthly (per pt preference) up to one year from last visit, then month to month grace period for 3 mo, then further med refills will have to be denied) ? ?

## 2021-08-31 ENCOUNTER — Ambulatory Visit (INDEPENDENT_AMBULATORY_CARE_PROVIDER_SITE_OTHER): Payer: Medicare Other | Admitting: *Deleted

## 2021-08-31 ENCOUNTER — Encounter: Payer: Self-pay | Admitting: *Deleted

## 2021-08-31 DIAGNOSIS — E1159 Type 2 diabetes mellitus with other circulatory complications: Secondary | ICD-10-CM

## 2021-08-31 DIAGNOSIS — I152 Hypertension secondary to endocrine disorders: Secondary | ICD-10-CM

## 2021-08-31 DIAGNOSIS — E1122 Type 2 diabetes mellitus with diabetic chronic kidney disease: Secondary | ICD-10-CM

## 2021-08-31 DIAGNOSIS — N182 Chronic kidney disease, stage 2 (mild): Secondary | ICD-10-CM

## 2021-08-31 NOTE — Chronic Care Management (AMB) (Signed)
?Chronic Care Management  ? ?CCM RN Visit Note ? ?08/31/2021 ?Name: Joseph Villa MRN: [id number] DOB: Oct 20, 1940 ? ?Subjective: ?Joseph Villa is a 81 y.o. year old male who is a primary care patient of Biagio Borg, MD. The care management team was consulted for assistance with disease management and care coordination needs.   ? ?Collaboration with [facility]  for  CCM Case Closure  in response to provider referral for case management and/or care coordination services.  ? ?Consent to Services:  ?The patient was given information about Chronic Care Management services, agreed to services, and gave verbal consent prior to initiation of services.  Please see initial visit note for detailed documentation.  ?Patient agreed to services and verbal consent obtained.  ? ?Assessment: Review of patient past medical history, allergies, medications, health status, including review of consultants reports, laboratory and other test data, was performed as part of comprehensive evaluation and provision of chronic care management services.  ?CCM Care Plan ? ?Allergies  ?Allergen Reactions  ? Cefuroxime Axetil Anaphylaxis  ?  REACTION: anaphylaxis-ceftin ?  ? Cefprozil   ?  REACTION: unknown  ? Cephalosporins Nausea Only  ? Crestor [Rosuvastatin Calcium]   ?  Muscle aches   ? Lipitor [Atorvastatin]   ?  myalgiaas  ? Metformin And Related Diarrhea  ? Rabeprazole Sodium Nausea Only  ?  REACTION: nausea  ? ?Outpatient Encounter Medications as of 08/31/2021  ?Medication Sig  ? nateglinide (STARLIX) 60 MG tablet TAKE ONE-HALF TABLET 3 TIMES DAILY WITH MEALS  ? PROAIR HFA 108 (90 Base) MCG/ACT inhaler INHALE 2 PUFFS 4 TIMES DAILY AS NEEDED  ? Accu-Chek FastClix Lancets MISC USE TWICE DAILY  ? ACCU-CHEK GUIDE test strip CHECK BLOOD GLUCOSE (SUGAR) TWICE DAILY  ? amLODipine (NORVASC) 10 MG tablet TAKE ONE TABLET EACH DAY  ? aspirin 81 MG chewable tablet Chew 1 tablet (81 mg total) by mouth daily.  ? benzonatate (TESSALON)  200 MG capsule Take 1 capsule (200 mg total) by mouth 3 (three) times daily as needed for cough.  ? bromocriptine (PARLODEL) 2.5 MG tablet Take 0.5 tablets (1.25 mg total) by mouth daily.  ? carbamazepine (TEGRETOL) 200 MG tablet TAKE 2 TABLETS AT BEDTIME  ? Cholecalciferol (VITAMIN D3) 2000 UNITS TABS Take 2,000 Units by mouth every morning.   ? clopidogrel (PLAVIX) 75 MG tablet Take 1 tablet (75 mg total) by mouth daily.  ? dapagliflozin propanediol (FARXIGA) 10 MG TABS tablet Take 1 tablet (10 mg total) by mouth daily before breakfast.  ? desoximetasone (TOPICORT) 0.25 % cream Apply 1 application topically 2 (two) times daily. (Patient taking differently: Apply 1 application. topically 2 (two) times daily as needed (psoriasis).)  ? Evolocumab (REPATHA SURECLICK) 409 MG/ML SOAJ Inject 1 Dose into the skin every 14 (fourteen) days.  ? ezetimibe (ZETIA) 10 MG tablet Take 1 tablet (10 mg total) by mouth daily.  ? fluticasone (FLONASE) 50 MCG/ACT nasal spray ONE SPRAY INTO NOSE EVERY DAY (Patient taking differently: Place 2 sprays into both nostrils daily as needed for rhinitis or allergies.)  ? Glucosamine HCl (GLUCOSAMINE PO) Take 1 tablet by mouth daily.  ? glucose blood (ACCU-CHEK AVIVA PLUS) test strip Used to check blood sugars twice a day Dx E11.9  ? glucose blood test strip Use to check blood sugar 2 times per day. DX Code: E11.9  ? ketoconazole (NIZORAL) 2 % cream Apply topically daily.  ? Lancets Misc. (ACCU-CHEK FASTCLIX LANCET) KIT AS DIRECTED  ?  levothyroxine (SYNTHROID) 112 MCG tablet TAKE ONE TABLET EACH DAY (Patient taking differently: Take 112 mcg by mouth at bedtime.)  ? Multiple Vitamins-Minerals (PRESERVISION AREDS 2 PO) Take 1 tablet by mouth daily.  ? Omega-3 Fatty Acids (FISH OIL) 1200 MG CAPS Take 1 capsule by mouth daily.  ? pantoprazole (PROTONIX) 40 MG tablet TAKE ONE TABLET DAILY  ? PROCTO-MED HC 2.5 % rectal cream Place rectally 2 (two) times daily.  ? sitaGLIPtin (JANUVIA) 50 MG tablet  Take 1 tablet (50 mg total) by mouth daily.  ? tamsulosin (FLOMAX) 0.4 MG CAPS capsule TAKE ONE CAPSULE EACH DAY  ? TURMERIC CURCUMIN PO Take 500 mg by mouth daily.  ? vitamin B-12 (CYANOCOBALAMIN) 1000 MCG tablet Take 1,000 mcg by mouth daily.  ? ?No facility-administered encounter medications on file as of 08/31/2021.  ? ?Patient Active Problem List  ? Diagnosis Date Noted  ? Acute respiratory failure with hypoxia ([place]) 05/12/2021  ? COVID-19 virus infection 05/12/2021  ? AKI (acute kidney injury) ([place]) 05/12/2021  ? Left carotid artery stenosis 11/20/2020  ? Amaurosis fugax of left eye 11/08/2020  ? Hyperlipidemia associated with type 2 diabetes mellitus ([place]) 11/08/2020  ? Statin myopathy 05/22/2020  ? Eczema 11/13/2018  ? Rash 11/13/2018  ? Great toe pain, right 11/01/2017  ? Chronic low back pain 05/02/2017  ? Chest pain 10/23/2016  ? CKD (chronic kidney disease), stage III ([place]) 04/19/2016  ? BPH (benign prostatic hypertrophy) with urinary obstruction 10/20/2015  ? Type 2 diabetes mellitus ([place]) 10/07/2015  ? Impingement syndrome of left shoulder 02/10/2015  ? Cough 07/09/2013  ? Positional vertigo 06/30/2012  ? Expected blood loss anemia 06/17/2012  ? Obese 06/17/2012  ? S/P left THA, AA 06/16/2012  ? Preop exam for internal medicine 06/04/2012  ? Peripheral neuropathy 12/21/2011  ? Anxiety 12/20/2011  ? Left hip pain 04/18/2011  ? Degenerative arthritis of hip 04/18/2011  ? Arthritis, lumbar spine 04/18/2011  ? Hemorrhoids, external 04/18/2011  ? Encounter for well adult exam with abnormal findings 12/23/2010  ? Hypothyroidism 10/06/2009  ? Hyperlipidemia 10/06/2009  ? BIPOLAR DISORDER UNSPECIFIED 10/06/2009  ? Hypertension associated with diabetes ([place]) 10/06/2009  ? GERD 10/06/2009  ? COLONIC POLYPS, HX OF 10/06/2009  ? NEPHROLITHIASIS, HX OF 10/06/2009  ? Obstructive sleep apnea 11/25/2007  ? Seasonal and perennial allergic rhinitis 11/25/2007  ? Allergic-infective asthma 11/25/2007  ? ?Conditions to be  addressed/monitored:  HTN and DMII ? ?Care Plan : RN Care Manager Plan of Care  ?Updates made by Knox Royalty, RN since 08/31/2021 12:00 AM  ?  ? ?Problem: Chronic Disease Management Needs   ?Priority: High  ?  ? ?Long-Range Goal: Development of plan of care for long term chronic disease management   ?Start Date: 06/11/2021  ?Expected End Date: 06/11/2022  ?Priority: High  ?Note:   ?Current Barriers:  ?Chronic Disease Management support and education needs related to HTN, DMII, and Recent COVID infection, requiring hospitalization ?Hospitalized December 10-16, 2022 for Acute respiratory failure due to covid; AKI- discharged home to self-care with new home O2 orders ?New to home oxygen ? ?RNCM Clinical Goal(s):  ?Patient will demonstrate ongoing health management independence as evidenced by aherence to plan of care for DMII/ HTN/ post-Covid infection        through collaboration with RN Care manager, provider, and care team.  ? ?Interventions: ?1:1 collaboration with primary care provider regarding development and update of comprehensive plan of care as evidenced by provider attestation and co-signature ?Inter-disciplinary care  team collaboration (see longitudinal plan of care) ?Evaluation of current treatment plan related to  self management and patient's adherence to plan as established by provider ?06/11/21- Initial assessment completed ? ?08/31/21: Noted through review of EHR/ collaboration with [facility] (note from 08/13/21)- unable to maintain contact with patient to re-schedule CCM RN CM follow up telephone visit; 3 consecutive unsuccessful call attempts were placed by scheduling care guide; case closure accordingly; PCP made aware of case closure ?  ? ?Plan: ?We have been unable to make contact with the patient for follow up. The care management team is available to follow up with the patient after provider conversation with the patient regarding recommendation for care management engagement and  subsequent re-referral to the care management team ?No further follow up required: case closure- unable to maintain contact with patient ? ?Oneta Rack, RN, BSN, CCRN Alumnus ?CCM [facility] Charity fundraiser

## 2021-09-10 ENCOUNTER — Other Ambulatory Visit: Payer: Self-pay | Admitting: Internal Medicine

## 2021-09-10 NOTE — Telephone Encounter (Signed)
Please refill as per office routine med refill policy (all routine meds to be refilled for 3 mo or monthly (per pt preference) up to one year from last visit, then month to month grace period for 3 mo, then further med refills will have to be denied) ? ?

## 2021-09-20 ENCOUNTER — Other Ambulatory Visit: Payer: Self-pay

## 2021-09-20 DIAGNOSIS — I6523 Occlusion and stenosis of bilateral carotid arteries: Secondary | ICD-10-CM

## 2021-09-21 ENCOUNTER — Ambulatory Visit: Payer: Medicare Other | Admitting: Endocrinology

## 2021-09-28 ENCOUNTER — Encounter (HOSPITAL_COMMUNITY): Payer: Medicare Other

## 2021-09-28 ENCOUNTER — Ambulatory Visit: Payer: Medicare Other

## 2021-10-05 DIAGNOSIS — H2513 Age-related nuclear cataract, bilateral: Secondary | ICD-10-CM | POA: Diagnosis not present

## 2021-10-05 DIAGNOSIS — E119 Type 2 diabetes mellitus without complications: Secondary | ICD-10-CM | POA: Diagnosis not present

## 2021-10-05 DIAGNOSIS — H52203 Unspecified astigmatism, bilateral: Secondary | ICD-10-CM | POA: Diagnosis not present

## 2021-10-05 DIAGNOSIS — H353131 Nonexudative age-related macular degeneration, bilateral, early dry stage: Secondary | ICD-10-CM | POA: Diagnosis not present

## 2021-10-10 ENCOUNTER — Ambulatory Visit (HOSPITAL_BASED_OUTPATIENT_CLINIC_OR_DEPARTMENT_OTHER): Payer: Medicare Other | Admitting: Internal Medicine

## 2021-10-29 ENCOUNTER — Other Ambulatory Visit: Payer: Self-pay | Admitting: Internal Medicine

## 2021-10-30 ENCOUNTER — Encounter: Payer: Self-pay | Admitting: Physician Assistant

## 2021-10-30 ENCOUNTER — Ambulatory Visit: Payer: Medicare Other | Admitting: Physician Assistant

## 2021-10-30 ENCOUNTER — Ambulatory Visit (HOSPITAL_COMMUNITY)
Admission: RE | Admit: 2021-10-30 | Discharge: 2021-10-30 | Disposition: A | Discharge status: Home / Self Care | Payer: Medicare Other | Source: Ambulatory Visit | Attending: Vascular Surgery | Admitting: Vascular Surgery

## 2021-10-30 VITALS — BP 176/50 | HR 68 | Temp 97.9°F | Resp 18 | Ht 71.0 in | Wt 253.1 lb

## 2021-10-30 DIAGNOSIS — I6523 Occlusion and stenosis of bilateral carotid arteries: Secondary | ICD-10-CM

## 2021-10-30 NOTE — Progress Notes (Signed)
History of Present Illness:  Patient is a 81 y.o. year old male who presents for evaluation of carotid stenosis.    He had symptomatic Left ICA stenosis with history of amaurosis.  He is s/p TCAR left ICA by Dr. Carlis Abbott on 11/20/20.   The patient denies symptoms of stroke/TIA, no new or recurrent amaurosis, or aphasia, or weakness in extremities.    He is medically managed on ASA and Plavix daily.  He has a Statin Allergy.       Past Medical History:  Diagnosis Date   ALLERGIC ASTHMA 11/25/2007   ALLERGIC RHINITIS 11/25/2007   Allergy    Arthritis, lumbar spine 04/18/2011   BIPOLAR DISORDER UNSPECIFIED 10/06/2009   Blood transfusion without reported diagnosis    s/p goiter surgery age 42    COLONIC POLYPS, HX OF 10/06/2009   Degenerative arthritis of hip 04/18/2011   DJD (degenerative joint disease)    DM w/o Complication Type II 86[birth year]1950   GERD 10/06/2009   Hemorrhage yrs ago   after goiter surgery, tracheostomy inserted and later removed   Hemorrhoid    History of kidney stones    HYPERLIPIDEMIA 10/06/2009   HYPERTENSION, BENIGN 07/27/2009   HYPOTHYROIDISM 10/06/2009   NEPHROLITHIASIS, HX OF 10/06/2009   OBSTRUCTIVE SLEEP APNEA 11/25/2007   cpap setting of 9   Peptic stricture of esophagus    RBBB (right bundle branch block)    Sleep apnea    wears c pap   Stroke [facility])     Past Surgical History:  Procedure Laterality Date   COLONOSCOPY     GANGLION CYST EXCISION     2-3 cysts removed   HERNIA REPAIR     JOINT REPLACEMENT     thyroid Goiter surgery     TONSILLECTOMY     TONSILLECTOMY     TOTAL HIP ARTHROPLASTY  06/16/2012   Procedure: TOTAL HIP ARTHROPLASTY ANTERIOR APPROACH;  Surgeon: Mauri Pole, MD;  Location: [facility] ORS;  Service: Orthopedics;  Laterality: Left;   TRANSCAROTID ARTERY REVASCULARIZATION  Left 11/20/2020   Procedure: LEFT TRANSCAROTID ARTERY REVASCULARIZATION;  Surgeon: Marty Heck, MD;  Location: [facility] OR;  Service: Vascular;   Laterality: Left;   UMBILICAL HERNIA REPAIR  several yrs ago   UPPER GASTROINTESTINAL ENDOSCOPY       Social History Social History   Tobacco Use   Smoking status: Former    Packs/day: 1.50    Years: 15.00    Pack years: 22.50    Types: Cigarettes, Pipe    Quit date: 06/23/1977    Years since quitting: 44.3   Smokeless tobacco: Never  Vaping Use   Vaping Use: Never used  Substance Use Topics   Alcohol use: No    Alcohol/week: 0.0 standard drinks   Drug use: No    Family History Family History  Problem Relation Age of Onset   Throat cancer Mother    Alcohol abuse Brother    Diabetes Brother    Drug abuse Daughter    Alcoholism Other        uncle   Breast cancer Father    Colon cancer Neg Hx    Colon polyps Neg Hx    Esophageal cancer Neg Hx    Rectal cancer Neg Hx    Stomach cancer Neg Hx     Allergies  Allergies  Allergen Reactions   Cefuroxime Axetil Anaphylaxis    REACTION: anaphylaxis-ceftin    Cefprozil     REACTION: unknown  Cephalosporins Nausea Only   Crestor [Rosuvastatin Calcium]     Muscle aches    Lipitor [Atorvastatin]     myalgiaas   Metformin And Related Diarrhea   Rabeprazole Sodium Nausea Only    REACTION: nausea     Current Outpatient Medications  Medication Sig Dispense Refill   Accu-Chek FastClix Lancets MISC USE TWICE DAILY 102 each 2   ACCU-CHEK GUIDE test strip CHECK BLOOD GLUCOSE (SUGAR) TWICE DAILY 200 each 3   amLODipine (NORVASC) 10 MG tablet TAKE ONE TABLET EACH DAY 90 tablet 3   aspirin 81 MG chewable tablet Chew 1 tablet (81 mg total) by mouth daily. 90 tablet 3   benzonatate (TESSALON) 200 MG capsule Take 1 capsule (200 mg total) by mouth 3 (three) times daily as needed for cough. 20 capsule 0   bromocriptine (PARLODEL) 2.5 MG tablet Take 0.5 tablets (1.25 mg total) by mouth daily. 45 tablet 3   carbamazepine (TEGRETOL) 200 MG tablet TAKE 2 TABLETS AT BEDTIME 180 tablet 1   Cholecalciferol (VITAMIN D3) 2000 UNITS  TABS Take 2,000 Units by mouth every morning.      clopidogrel (PLAVIX) 75 MG tablet Take 1 tablet (75 mg total) by mouth daily. 30 tablet 3   dapagliflozin propanediol (FARXIGA) 10 MG TABS tablet Take 1 tablet (10 mg total) by mouth daily before breakfast. 30 tablet 6   desoximetasone (TOPICORT) 0.25 % cream Apply 1 application topically 2 (two) times daily. (Patient taking differently: Apply 1 application. topically 2 (two) times daily as needed (psoriasis).) 100 g 0   Evolocumab (REPATHA SURECLICK) 850 MG/ML SOAJ Inject 1 Dose into the skin every 14 (fourteen) days. 2 mL 11   ezetimibe (ZETIA) 10 MG tablet Take 1 tablet (10 mg total) by mouth daily. 30 tablet 2   fluticasone (FLONASE) 50 MCG/ACT nasal spray ONE SPRAY INTO NOSE EVERY DAY (Patient taking differently: Place 2 sprays into both nostrils daily as needed for rhinitis or allergies.) 48 g 1   Glucosamine HCl (GLUCOSAMINE PO) Take 1 tablet by mouth daily.     glucose blood (ACCU-CHEK AVIVA PLUS) test strip Used to check blood sugars twice a day Dx E11.9 200 each 0   glucose blood test strip Use to check blood sugar 2 times per day. DX Code: E11.9 200 each 2   ketoconazole (NIZORAL) 2 % cream Apply topically daily.     Lancets Misc. (ACCU-CHEK FASTCLIX LANCET) KIT AS DIRECTED 1 kit 0   levothyroxine (SYNTHROID) 112 MCG tablet TAKE ONE TABLET EACH DAY 90 tablet 2   Multiple Vitamins-Minerals (PRESERVISION AREDS 2 PO) Take 1 tablet by mouth daily.     nateglinide (STARLIX) 60 MG tablet TAKE ONE-HALF TABLET 3 TIMES DAILY WITH MEALS 135 tablet 0   Omega-3 Fatty Acids (FISH OIL) 1200 MG CAPS Take 1 capsule by mouth daily.     pantoprazole (PROTONIX) 40 MG tablet TAKE ONE TABLET DAILY 90 tablet 2   PROAIR HFA 108 (90 Base) MCG/ACT inhaler INHALE 2 PUFFS 4 TIMES DAILY AS NEEDED 8.5 g 0   PROCTO-MED HC 2.5 % rectal cream Place rectally 2 (two) times daily.     sitaGLIPtin (JANUVIA) 50 MG tablet Take 1 tablet (50 mg total) by mouth daily. 90  tablet 3   tamsulosin (FLOMAX) 0.4 MG CAPS capsule TAKE ONE CAPSULE EACH DAY 90 capsule 3   TURMERIC CURCUMIN PO Take 500 mg by mouth daily.     vitamin B-12 (CYANOCOBALAMIN) 1000 MCG tablet Take 1,000 mcg by  mouth daily.     No current facility-administered medications for this visit.    ROS:   General:  No weight loss, Fever, chills  HEENT: No recent headaches, no nasal bleeding, no visual changes, no sore throat  Neurologic: No dizziness, blackouts, seizures. No recent symptoms of stroke or mini- stroke. No recent episodes of slurred speech, or temporary blindness.  Cardiac: No recent episodes of chest pain/pressure, no shortness of breath at rest.  No shortness of breath with exertion.  Denies history of atrial fibrillation or irregular heartbeat  Vascular: No history of rest pain in feet.  No history of claudication.  No history of non-healing ulcer, No history of DVT   Pulmonary: No home oxygen, no productive cough, no hemoptysis,  No asthma or wheezing  Musculoskeletal:  '[ ]'  Arthritis, '[ ]'  Low back pain,  '[ ]'  Joint pain  Hematologic:No history of hypercoagulable state.  No history of easy bleeding.  No history of anemia  Gastrointestinal: No hematochezia or melena,  No gastroesophageal reflux, no trouble swallowing  Urinary: '[ ]'  chronic Kidney disease, '[ ]'  on HD - '[ ]'  MWF or '[ ]'  TTHS, '[ ]'  Burning with urination, '[ ]'  Frequent urination, '[ ]'  Difficulty urinating;   Skin: No rashes  Psychological: No history of anxiety,  No history of depression   Physical Examination  Vitals:   10/30/21 0830 10/30/21 0832  BP: (!) 152/68 (!) 176/50  Pulse: 68   Resp: 18   Temp: 97.9 F (36.6 C)   TempSrc: Oral   SpO2: 96%   Weight: 253 lb 1.6 oz (114.8 kg)   Height: '5\' 11"'  (1.803 m)     Body mass index is 35.3 kg/m.  General:  Alert and oriented, no acute distress HEENT: Normal Neck: No bruit or JVD Pulmonary: Clear to auscultation bilaterally Cardiac: Regular Rate and  Rhythm without murmur Gastrointestinal: Soft, non-tender, non-distended, no mass, no scars Skin: No rash Extremity Pulses:  2+ radial, feet are warm and well perfused without ischemic changes Musculoskeletal: No deformity or edema  Neurologic: Upper and lower extremity motor 5/5 and symmetric  DATA:      Right Carotid Findings:  +----------+--------+--------+--------+-------------------------+--------+            PSV cm/sEDV cm/sStenosisPlaque Description       Comments  +----------+--------+--------+--------+-------------------------+--------+  CCA Prox  96      12                                                 +----------+--------+--------+--------+-------------------------+--------+  CCA Mid   74      12                                                 +----------+--------+--------+--------+-------------------------+--------+  CCA Distal65      12                                                 +----------+--------+--------+--------+-------------------------+--------+  ICA Prox  212     51      40-59%  calcific and heterogenous          +----------+--------+--------+--------+-------------------------+--------+  ICA Mid   193     43      40-59%                                     +----------+--------+--------+--------+-------------------------+--------+  ICA Distal56      15                                                 +----------+--------+--------+--------+-------------------------+--------+  ECA       114     11                                                 +----------+--------+--------+--------+-------------------------+--------+   +----------+--------+-------+----------------+-------------------+            PSV cm/sEDV cmsDescribe        Arm Pressure (mmHG)  +----------+--------+-------+----------------+-------------------+  [id number]             Multiphasic, WNL                      +----------+--------+-------+----------------+-------------------+   +---------+--------+--+--------+--+---------+  VertebralPSV cm/s55EDV cm/s13Antegrade  +---------+--------+--+--------+--+---------+       Left Carotid Findings:  +----------+--------+--------+--------+------------------+--------+            PSV cm/sEDV cm/sStenosisPlaque DescriptionComments  +----------+--------+--------+--------+------------------+--------+  CCA Prox  87      16                                          +----------+--------+--------+--------+------------------+--------+  CCA Mid   76      14                                          +----------+--------+--------+--------+------------------+--------+  CCA Distal77      11                                          +----------+--------+--------+--------+------------------+--------+  ICA Prox                                            stent     +----------+--------+--------+--------+------------------+--------+  ICA Mid                                             stent     +----------+--------+--------+--------+------------------+--------+  ICA Distal71      23                                          +----------+--------+--------+--------+------------------+--------+  ECA  109     11                                          +----------+--------+--------+--------+------------------+--------+   +----------+--------+--------+----------------+-------------------+            PSV cm/sEDV cm/sDescribe        Arm Pressure (mmHG)  +----------+--------+--------+----------------+-------------------+  [id number]             Multiphasic, WNL                     +----------+--------+--------+----------------+-------------------+   +---------+--------+--+--------+-+---------+  VertebralPSV cm/s45EDV cm/s7Antegrade  +---------+--------+--+--------+-+---------+      Left  Stent(s):  +---------------+--------+--------+--------+--------+--------+  ICA            PSV cm/sEDV cm/sStenosisWaveformComments  +---------------+--------+--------+--------+--------+--------+  Prox to Stent  77      11                                +---------------+--------+--------+--------+--------+--------+  Proximal Stent 71      14                                +---------------+--------+--------+--------+--------+--------+  Mid Stent      50      15                                +---------------+--------+--------+--------+--------+--------+  Distal Stent   74      24                                +---------------+--------+--------+--------+--------+--------+  Distal to Stent71      23                                +---------------+--------+--------+--------+--------+--------+      Summary:  Right Carotid: Velocities in the right ICA are consistent with a 40-59%                 stenosis.   Left Carotid: ICA stent is patent with no stenosis.   Vertebrals:  Bilateral vertebral arteries demonstrate antegrade flow.  Subclavians: Normal flow hemodynamics were seen in bilateral subclavian               arteries.   ASSESSMENT/PLAN: Carotid stenosis with history of  left TCAR on 11/20/2020 by Dr. Carlis Abbott. This was due to symptomatic high-grade left internal carotid artery stenosis (>80%) with left brain stroke.  His stroke symptoms of amaurosis have resolved.  He will cont. ASA and Plavix   He will f/u in 1 year for repeat surveillance.  If he develops symptoms of stroke he will call 911.       Roxy Horseman PA-C [facility] [phone]  MD in clinic [facility]

## 2021-11-06 ENCOUNTER — Other Ambulatory Visit: Payer: Self-pay | Admitting: Internal Medicine

## 2021-12-13 ENCOUNTER — Other Ambulatory Visit: Payer: Self-pay | Admitting: Internal Medicine

## 2021-12-14 ENCOUNTER — Telehealth: Payer: Self-pay | Admitting: Internal Medicine

## 2021-12-14 NOTE — Telephone Encounter (Signed)
Patient's wife notified that we do not handle CPAP supplies and orders. Patient to contact pulmonologist

## 2021-12-14 NOTE — Telephone Encounter (Signed)
Pt wife called and stated that Morey Hummingbird from [facility] advised her to call PCP and request an Order for CPAP machine be sent over. Pt CPAP machine gave a message stating that the motor has exceeded it's life span.   Phone: [phone] Fax: [phone]

## 2021-12-26 ENCOUNTER — Telehealth: Payer: Self-pay

## 2021-12-26 NOTE — Patient Outreach (Signed)
  Care Management   Outreach Note  12/26/2021 Name: Joseph Villa MRN: [id number] DOB: July 29, 1940  An unsuccessful telephone outreach was attempted today. The patient was referred to the case management team for assistance with care management and care coordination.   Follow Up Plan:  The care management team will reach out to the patient again over the next 30 days.   Thea Silversmith, RN, MSN, BSN, CCM Care Management Coordinator [phone]

## 2021-12-31 ENCOUNTER — Telehealth: Payer: Self-pay

## 2021-12-31 ENCOUNTER — Encounter: Payer: Self-pay | Admitting: Internal Medicine

## 2021-12-31 NOTE — Progress Notes (Unsigned)
   11/03/2015-81 year old male former smoker followed for Allergic rhinitis, Asthma, OSA,  complicated by DM, bipolar, GERD CPAP 10/[facility] FOLLOWS FOR: DME is [facility].Pt wears CPAP every night. DL attached. Pt has questions/concerns about Benicar. Pt does not feel he is sleeping as well as he should and has issues with headgear. Reports breathing better since BP med was changed from ACE inhibitor to ARB/Benicar. He will have his PCP manage this. States he is only sleeping about 4 hours because he goes to bed late and then bladder gets him up early in the morning. His download actually indicates an average sleep time between 6 and 8 hours. He does tend to doze off during the day in his recliner if sitting quietly but his download confirms excellent control and compliance.  01/01/22- 80yoM former smoker coming to re-establish. Followed for OSA/ CPAP with remote NPSG Medical problem list includes HTN, DM2, CVA,  Asthma, GERD, Hyperlipidemia, Hypothyroid, BPH, BiPOLAR, Obesity,  NPSG predates 2009, done ?Atlas Epworth score- Body weight today-  ROS-see HPI Constitutional:   No-   weight loss, night sweats, fevers, chills, fatigue, lassitude. HEENT:   No-  headaches, difficulty swallowing, tooth/dental problems, sore throat,       No-  Sneezing,no- itching, ear ache, +nasal congestion, +post nasal drip,  CV:  No-   chest pain, orthopnea, PND, swelling in lower extremities, anasarca, dizziness, palpitations Resp: +-shortness of breath with exertion or at rest.              No-productive cough,  No non-productive cough,  No- coughing up of blood.              No-change in color of mucus.  No- wheezing.   Skin: No-   rash or lesions. GI:  No-   heartburn, indigestion, abdominal pain, nausea, vomiting,  GU: . MS:  No-   joint pain or swelling.  Neuro-     nothing unusual Psych:  No- change in mood or affect. No depression or anxiety.  No memory loss.   OBJ- General- Alert, Oriented,  Affect-appropriate, Distress- none acute, mild hoarseness. + Overweight Skin- rash-none, lesions- none, excoriation- none Lymphadenopathy- none Head- atraumatic            Eyes- Gross vision intact, PERRLA, conjunctivae clear secretions            Ears- +Hearing aids/ HOH            Nose- Clear, no-Septal dev, mucus, polyps, erosion, perforation             Throat- Mallampati IV , mucosa clear , drainage- none, tonsils present Neck- flexible , trachea midline, no stridor , thyroid nl, carotid no bruit Chest - symmetrical excursion , unlabored           Heart/CV- RRR , no murmur , no gallop  , no rub, nl s1 s2                           - JVD- none , edema- none, stasis changes- none, varices- none           Lung- clear to P&A, wheeze- none, cough-none , dullness-none, rub- none           Chest wall-  Abd-  Br/ Gen/ Rectal- Not done, not indicated Extrem- cyanosis- none, clubbing, none, atrophy- none, strength- nl Neuro- grossly intact to observation

## 2021-12-31 NOTE — Patient Outreach (Signed)
[place] [facility]) Care Management  12/31/2021  Joseph Villa 1941-03-30 [id number]   Care Management   Outreach Note  12/31/2021 Name: Joseph Villa MRN: [id number] DOB: Jan 20, 1941  A second unsuccessful telephone outreach was attempted today. The patient was referred to the case management team for assistance with care management and care coordination. No answer. Unable to leave voice message.  Follow Up Plan:  The care management team will reach out to the patient again over the next 30 days.   Thea Silversmith, RN, MSN, BSN, CCM Care Management Coordinator [phone]

## 2022-01-01 ENCOUNTER — Ambulatory Visit: Payer: Medicare Other | Admitting: Internal Medicine

## 2022-01-01 ENCOUNTER — Encounter: Payer: Self-pay | Admitting: Internal Medicine

## 2022-01-01 ENCOUNTER — Ambulatory Visit: Payer: Medicare Other | Admitting: Pulmonary Disease

## 2022-01-01 VITALS — BP 140/68 | HR 75 | Temp 98.0°F | Ht 71.0 in | Wt 247.8 lb

## 2022-01-01 DIAGNOSIS — G4733 Obstructive sleep apnea (adult) (pediatric): Secondary | ICD-10-CM

## 2022-01-01 NOTE — Patient Instructions (Signed)
Order- DME [facility]- please replace old CPAP machine, change to auto 5-15, mask of choice, humidifier, supplies,   Please install AirView/ card  Glad you are doing well. Please cal if we can help

## 2022-01-01 NOTE — Assessment & Plan Note (Signed)
Benefits from CPAP with good long-term compliance and control Plan- [facility] replace old CPAP machine, changing to autopap 5-15

## 2022-01-01 NOTE — Assessment & Plan Note (Signed)
Weight loss encouraged, especially need to avoid upward drift.

## 2022-01-03 ENCOUNTER — Telehealth: Payer: Self-pay

## 2022-01-03 NOTE — Patient Outreach (Addendum)
  Care Coordination   01/03/2022 Name: Joseph Villa MRN: [id number] DOB: 1940-12-05   Care Coordination Outreach Attempts:  A third unsuccessful outreach was attempted today to offer the patient with information about available care coordination services as a benefit of their health plan.   Follow Up Plan:  No further outreach attempts will be made at this time. We have been unable to contact the patient to offer or enroll patient in care coordination services  Encounter Outcome:  No Answer  Care Coordination Interventions Activated:  No   Care Coordination Interventions:  No, not indicated    Thea Silversmith, RN, MSN, BSN, CCM Care Coordinator [phone]

## 2022-01-04 ENCOUNTER — Ambulatory Visit (INDEPENDENT_AMBULATORY_CARE_PROVIDER_SITE_OTHER): Payer: Medicare Other | Admitting: Internal Medicine

## 2022-01-04 ENCOUNTER — Encounter: Payer: Self-pay | Admitting: Internal Medicine

## 2022-01-04 VITALS — BP 142/64 | HR 69 | Temp 98.1°F | Ht 71.0 in | Wt 246.0 lb

## 2022-01-04 DIAGNOSIS — E559 Vitamin D deficiency, unspecified: Secondary | ICD-10-CM

## 2022-01-04 DIAGNOSIS — E7800 Pure hypercholesterolemia, unspecified: Secondary | ICD-10-CM

## 2022-01-04 DIAGNOSIS — E1159 Type 2 diabetes mellitus with other circulatory complications: Secondary | ICD-10-CM | POA: Diagnosis not present

## 2022-01-04 DIAGNOSIS — I152 Hypertension secondary to endocrine disorders: Secondary | ICD-10-CM | POA: Diagnosis not present

## 2022-01-04 DIAGNOSIS — E1122 Type 2 diabetes mellitus with diabetic chronic kidney disease: Secondary | ICD-10-CM

## 2022-01-04 DIAGNOSIS — R21 Rash and other nonspecific skin eruption: Secondary | ICD-10-CM

## 2022-01-04 DIAGNOSIS — N182 Chronic kidney disease, stage 2 (mild): Secondary | ICD-10-CM | POA: Diagnosis not present

## 2022-01-04 LAB — HEPATIC FUNCTION PANEL
ALT: 18 U/L (ref 0–53)
AST: 17 U/L (ref 0–37)
Albumin: 4.2 g/dL (ref 3.5–5.2)
Alkaline Phosphatase: 81 U/L (ref 39–117)
Bilirubin, Direct: 0 mg/dL (ref 0.0–0.3)
Total Bilirubin: 0.2 mg/dL (ref 0.2–1.2)
Total Protein: 7.2 g/dL (ref 6.0–8.3)

## 2022-01-04 LAB — LIPID PANEL
Cholesterol: 119 mg/dL (ref 0–200)
HDL: 46.3 mg/dL (ref >39.00)
Total CHOL/HDL Ratio: 3
Triglycerides: 436 mg/dL — ABNORMAL HIGH (ref 0.0–149.0)

## 2022-01-04 LAB — BASIC METABOLIC PANEL
BUN: 38 mg/dL — ABNORMAL HIGH (ref 6–23)
CO2: 26 mEq/L (ref 19–32)
Calcium: 9.6 mg/dL (ref 8.4–10.5)
Chloride: 102 mEq/L (ref 96–112)
Creatinine, Ser: 1.73 mg/dL — ABNORMAL HIGH (ref 0.40–1.50)
GFR: 37.28 mL/min — ABNORMAL LOW (ref >60.00)
Glucose, Bld: 191 mg/dL — ABNORMAL HIGH (ref 70–99)
Potassium: 4.2 mEq/L (ref 3.5–5.1)
Sodium: 138 mEq/L (ref 135–145)

## 2022-01-04 LAB — HEMOGLOBIN A1C: Hgb A1c MFr Bld: 7.2 % — ABNORMAL HIGH (ref 4.6–6.5)

## 2022-01-04 LAB — VITAMIN D 25 HYDROXY (VIT D DEFICIENCY, FRACTURES): VITD: 30.01 ng/mL (ref 30.00–100.00)

## 2022-01-04 LAB — LDL CHOLESTEROL, DIRECT: Direct LDL: 38 mg/dL

## 2022-01-04 MED ORDER — RYBELSUS 3 MG PO TABS: 3.0000 mg | ORAL_TABLET | Freq: Every day | ORAL | 3 refills | Status: DC

## 2022-01-04 MED ORDER — LOSARTAN POTASSIUM 50 MG PO TABS: 50.0000 mg | ORAL_TABLET | Freq: Every day | ORAL | 3 refills | Status: DC

## 2022-01-04 NOTE — Patient Instructions (Signed)
Please take all new medication as prescribed - the losartan 50 mg per day for BP, and rybelsus 3 mg per day for sugar and weight loss  You will be contacted regarding the referral for: Lipid clinic, as well as the Diabetes Education Classes  Please continue all other medications as before, and refills have been done if requested.  Please have the pharmacy call with any other refills you may need.  Please continue your efforts at being more active, low cholesterol diet, and weight control.  You are otherwise up to date with prevention measures today.  Please keep your appointments with your specialists as you may have planned  Please go to the LAB at the blood drawing area for the tests to be done  You will be contacted by phone if any changes need to be made immediately.  Otherwise, you will receive a letter about your results with an explanation, but please check with MyChart first.  Please remember to sign up for MyChart if you have not done so, as this will be important to you in the future with finding out test results, communicating by private email, and scheduling acute appointments online when needed.  Please make an Appointment to return in 6 months, or sooner if needed

## 2022-01-04 NOTE — Progress Notes (Signed)
Patient ID: Joseph Villa, male   DOB: January 08, 1941, 81 y.o.   MRN: [id number]        Chief Complaint: follow up HTN, HLD and dm       HPI:  Joseph Villa is a 81 y.o. male here overall doing ok, trying to follow DM diet with lower cholesterol and wt is actually down several lbs with diet, but unfortunately now CBGs are in the 160's or slightly higher range, hard to lose further wt with exercise, and has been statin intolerant in the past.  Pt denies chest pain, increased sob or doe, wheezing, orthopnea, PND, increased LE swelling, palpitations, dizziness or syncope.   Pt denies polydipsia, polyuria, or new focal neuro s/s.    Pt denies fever, wt loss, night sweats, loss of appetite, or other constitutional symptoms  Denies acute illness otherwise to explain increased sugars.  Losts wt with better diet.   BPs have been about 140/90 at home Wt Readings from Last 3 Encounters:  01/04/22 246 lb (111.6 kg)  01/01/22 247 lb 12.8 oz (112.4 kg)  10/30/21 253 lb 1.6 oz (114.8 kg)   BP Readings from Last 3 Encounters:  01/04/22 (!) 142/64  01/01/22 (!) 140/68  10/30/21 (!) 176/50         Past Medical History:  Diagnosis Date   ALLERGIC ASTHMA 11/25/2007   ALLERGIC RHINITIS 11/25/2007   Allergy    Arthritis, lumbar spine 04/18/2011   BIPOLAR DISORDER UNSPECIFIED 10/06/2009   Blood transfusion without reported diagnosis    s/p goiter surgery age 43    COLONIC POLYPS, HX OF 10/06/2009   Degenerative arthritis of hip 04/18/2011   DJD (degenerative joint disease)    DM w/o Complication Type II 71/69/6789   GERD 10/06/2009   Hemorrhage yrs ago   after goiter surgery, tracheostomy inserted and later removed   Hemorrhoid    History of kidney stones    HYPERLIPIDEMIA 10/06/2009   HYPERTENSION, BENIGN 07/27/2009   HYPOTHYROIDISM 10/06/2009   NEPHROLITHIASIS, HX OF 10/06/2009   OBSTRUCTIVE SLEEP APNEA 11/25/2007   cpap setting of 9   Peptic stricture of esophagus    RBBB (right bundle  branch block)    Sleep apnea    wears c pap   Stroke [facility])    Past Surgical History:  Procedure Laterality Date   COLONOSCOPY     GANGLION CYST EXCISION     2-3 cysts removed   HERNIA REPAIR     JOINT REPLACEMENT     thyroid Goiter surgery     TONSILLECTOMY     TONSILLECTOMY     TOTAL HIP ARTHROPLASTY  06/16/2012   Procedure: TOTAL HIP ARTHROPLASTY ANTERIOR APPROACH;  Surgeon: Mauri Pole, MD;  Location: [facility];  Service: Orthopedics;  Laterality: Left;   TRANSCAROTID ARTERY REVASCULARIZATION  Left 11/20/2020   Procedure: LEFT TRANSCAROTID ARTERY REVASCULARIZATION;  Surgeon: Marty Heck, MD;  Location: [facility];  Service: Vascular;  Laterality: Left;   UMBILICAL HERNIA REPAIR  several yrs ago   UPPER GASTROINTESTINAL ENDOSCOPY      reports that he quit smoking about 44 years ago. His smoking use included cigarettes and pipe. He has a 22.50 pack-year smoking history. He has never used smokeless tobacco. He reports that he does not drink alcohol and does not use drugs. family history includes Alcohol abuse in his brother; Alcoholism in an other family member; Breast cancer in his father; Diabetes in his brother; Drug abuse in his daughter; Throat cancer in  his mother. Allergies  Allergen Reactions   Cefuroxime Axetil Anaphylaxis    REACTION: anaphylaxis-ceftin    Cefprozil     REACTION: unknown   Cephalosporins Nausea Only   Crestor [Rosuvastatin Calcium]     Muscle aches    Grass Pollen(K-O-R-T-Swt Vern)    Lipitor [Atorvastatin]     myalgiaas   Metformin And Related Diarrhea   Rabeprazole Sodium Nausea Only    REACTION: nausea   Current Outpatient Medications on File Prior to Visit  Medication Sig Dispense Refill   Accu-Chek FastClix Lancets MISC USE TWICE DAILY 102 each 2   ACCU-CHEK GUIDE test strip CHECK BLOOD GLUCOSE (SUGAR) TWICE DAILY 200 each 3   amLODipine (NORVASC) 10 MG tablet TAKE ONE TABLET EACH DAY 90 tablet 3   aspirin 81 MG chewable tablet Chew 1  tablet (81 mg total) by mouth daily. 90 tablet 3   bromocriptine (PARLODEL) 2.5 MG tablet Take 0.5 tablets (1.25 mg total) by mouth daily. 45 tablet 3   carbamazepine (TEGRETOL) 200 MG tablet TAKE 2 TABLETS AT BEDTIME 180 tablet 1   Cholecalciferol (VITAMIN D3) 2000 UNITS TABS Take 2,000 Units by mouth every morning.      clopidogrel (PLAVIX) 75 MG tablet TAKE ONE TABLET EACH DAY 30 tablet 0   dapagliflozin propanediol (FARXIGA) 10 MG TABS tablet Take 1 tablet (10 mg total) by mouth daily before breakfast. 30 tablet 6   desoximetasone (TOPICORT) 0.25 % cream Apply 1 application topically 2 (two) times daily. (Patient taking differently: Apply 1 application  topically 2 (two) times daily as needed (psoriasis).) 100 g 0   Evolocumab (REPATHA SURECLICK) 027 MG/ML SOAJ Inject 1 Dose into the skin every 14 (fourteen) days. 2 mL 11   ezetimibe (ZETIA) 10 MG tablet TAKE ONE TABLET EACH DAY 30 tablet 0   fluticasone (FLONASE) 50 MCG/ACT nasal spray ONE SPRAY INTO NOSE EVERY DAY (Patient taking differently: Place 2 sprays into both nostrils daily as needed for rhinitis or allergies.) 48 g 1   Glucosamine HCl (GLUCOSAMINE PO) Take 1 tablet by mouth daily.     glucose blood (ACCU-CHEK AVIVA PLUS) test strip Used to check blood sugars twice a day Dx E11.9 200 each 0   glucose blood test strip Use to check blood sugar 2 times per day. DX Code: E11.9 200 each 2   ketoconazole (NIZORAL) 2 % cream Apply topically daily.     Lancets Misc. (ACCU-CHEK FASTCLIX LANCET) KIT AS DIRECTED 1 kit 0   levothyroxine (SYNTHROID) 112 MCG tablet TAKE ONE TABLET EACH DAY 90 tablet 2   Multiple Vitamins-Minerals (PRESERVISION AREDS 2 PO) Take 1 tablet by mouth daily.     nateglinide (STARLIX) 60 MG tablet TAKE ONE-HALF TABLET 3 TIMES DAILY WITH MEALS 135 tablet 0   Omega-3 Fatty Acids (FISH OIL) 1200 MG CAPS Take 1 capsule by mouth daily.     pantoprazole (PROTONIX) 40 MG tablet TAKE ONE TABLET DAILY 90 tablet 2   PROCTO-MED HC  2.5 % rectal cream Place rectally 2 (two) times daily.     sitaGLIPtin (JANUVIA) 50 MG tablet Take 1 tablet (50 mg total) by mouth daily. 90 tablet 3   tamsulosin (FLOMAX) 0.4 MG CAPS capsule TAKE ONE CAPSULE EACH DAY 90 capsule 3   TURMERIC CURCUMIN PO Take 500 mg by mouth daily.     vitamin B-12 (CYANOCOBALAMIN) 1000 MCG tablet Take 1,000 mcg by mouth daily.     albuterol (VENTOLIN HFA) 108 (90 Base) MCG/ACT inhaler INHALE 2  PUFFS 4 TIMES DAILY AS NEEDED (Patient not taking: Reported on 01/01/2022) 8.5 g 0   No current facility-administered medications on file prior to visit.        ROS:  All others reviewed and negative.  Objective        PE:  BP (!) 142/64 (BP Location: Right Arm, Patient Position: Sitting, Cuff Size: Large)   Pulse 69   Temp 98.1 F (36.7 C) (Oral)   Ht _0  (1.803 m)   Wt 246 lb (111.6 kg)   SpO2 95%   BMI 34.31 kg/m                 Constitutional: Pt appears in NAD               HENT: Head: NCAT.                Right Ear: External ear normal.                 Left Ear: External ear normal.                Eyes: . Pupils are equal, round, and reactive to light. Conjunctivae and EOM are normal               Nose: without d/c or deformity               Neck: Neck supple. Gross normal ROM               Cardiovascular: Normal rate and regular rhythm.                 Pulmonary/Chest: Effort normal and breath sounds without rales or wheezing.                Abd:  Soft, NT, ND, + BS, no organomegaly               Neurological: Pt is alert. At baseline orientation, motor grossly intact               Skin: Skin is warm. No rashes, no other new lesions, LE edema - none               Psychiatric: Pt behavior is normal without agitation   Micro: none  Cardiac tracings I have personally interpreted today:  none  Pertinent Radiological findings (summarize): none   Lab Results  Component Value Date   WBC 7.9 06/05/2021   HGB 11.4 (L) 06/05/2021   HCT 35.2 (L)  06/05/2021   PLT 240.0 06/05/2021   GLUCOSE 191 (H) 01/04/2022   CHOL 119 01/04/2022   TRIG (H) 01/04/2022    436.0 Triglyceride is over 400; calculations on Lipids are invalid.   HDL 46.30 01/04/2022   LDLDIRECT 38.0 01/04/2022   LDLCALC 118 (H) 11/21/2020   ALT 18 01/04/2022   AST 17 01/04/2022   NA 138 01/04/2022   K 4.2 01/04/2022   CL 102 01/04/2022   CREATININE 1.73 (H) 01/04/2022   BUN 38 (H) 01/04/2022   CO2 26 01/04/2022   TSH 2.35 06/05/2021   PSA 0.84 10/23/2020   INR 1.0 11/16/2020   HGBA1C 7.2 (H) 01/04/2022   MICROALBUR 13.3 (H) 10/23/2020   Assessment/Plan:  JAPHETH DIEKMAN is a 81 y.o. White or Caucasian [1] male with  has a past medical history of ALLERGIC ASTHMA (11/25/2007), ALLERGIC RHINITIS (11/25/2007), Allergy, Arthritis, lumbar spine (04/18/2011), BIPOLAR DISORDER UNSPECIFIED (10/06/2009), Blood transfusion without reported diagnosis, COLONIC  POLYPS, HX OF (10/06/2009), Degenerative arthritis of hip (04/18/2011), DJD (degenerative joint disease), DM w/o Complication Type II (06/05/7251), GERD (10/06/2009), Hemorrhage (yrs ago), Hemorrhoid, History of kidney stones, HYPERLIPIDEMIA (10/06/2009), HYPERTENSION, BENIGN (07/27/2009), HYPOTHYROIDISM (10/06/2009), NEPHROLITHIASIS, HX OF (10/06/2009), OBSTRUCTIVE SLEEP APNEA (11/25/2007), Peptic stricture of esophagus, RBBB (right bundle branch block), Sleep apnea, and Stroke ([place]).  Hyperlipidemia Lab Results  Component Value Date   LDLCALC 118 (H) 11/21/2020   Uncontrolled, statin intolerant,, pt to continue current zetia 10 mg, and refer lipid clinic perhaps for repatha   Hypertension associated with diabetes ([place]) BP Readings from Last 3 Encounters:  01/04/22 (!) 142/64  01/01/22 (!) 140/68  10/30/21 (!) 176/50   Uncontrolled,, pt to continue medical treatment norvasc 10 mg, and add losartan 50 mg qd   Type 2 diabetes mellitus (HCC) Lab Results  Component Value Date   HGBA1C 7.2 (H) 01/04/2022    Mild uncontrolled, cont farxiga 10 qd, starix 60 mg qd, and januvia 50 qd, add rybelsus 3 mg for better sugar and wt control pt to continue current medical treatment, also refer DM education  Followup: No follow-ups on file.  Cathlean Cower, MD 01/06/2022 7:54 PM [facility]

## 2022-01-06 NOTE — Assessment & Plan Note (Addendum)
Lab Results  Component Value Date   HGBA1C 7.2 (H) 01/04/2022   Mild uncontrolled, cont farxiga 10 qd, starix 60 mg qd, and januvia 50 qd, add rybelsus 3 mg for better sugar and wt control pt to continue current medical treatment, also refer DM education

## 2022-01-06 NOTE — Assessment & Plan Note (Addendum)
BP Readings from Last 3 Encounters:  01/04/22 (!) 142/64  01/01/22 (!) 140/68  10/30/21 (!) 176/50   Uncontrolled,, pt to continue medical treatment norvasc 10 mg, and add losartan 50 mg qd

## 2022-01-06 NOTE — Assessment & Plan Note (Signed)
Lab Results  Component Value Date   LDLCALC 118 (H) 11/21/2020   Uncontrolled, statin intolerant,, pt to continue current zetia 10 mg, and refer lipid clinic perhaps for repatha

## 2022-01-08 ENCOUNTER — Other Ambulatory Visit: Payer: Self-pay

## 2022-01-08 MED ORDER — DAPAGLIFLOZIN PROPANEDIOL 10 MG PO TABS: 10.0000 mg | ORAL_TABLET | Freq: Every day | ORAL | 2 refills | Status: DC

## 2022-01-10 ENCOUNTER — Other Ambulatory Visit: Payer: Self-pay | Admitting: Internal Medicine

## 2022-01-14 ENCOUNTER — Encounter: Payer: Self-pay | Admitting: *Deleted

## 2022-01-17 DIAGNOSIS — G4733 Obstructive sleep apnea (adult) (pediatric): Secondary | ICD-10-CM | POA: Diagnosis not present

## 2022-01-28 ENCOUNTER — Telehealth: Payer: Self-pay | Admitting: Internal Medicine

## 2022-01-28 DIAGNOSIS — G4733 Obstructive sleep apnea (adult) (pediatric): Secondary | ICD-10-CM

## 2022-01-31 NOTE — Telephone Encounter (Signed)
Ok to change CPAP to fi

## 2022-01-31 NOTE — Telephone Encounter (Signed)
Spoke with the pt  He states that the CPAP set at auto 5-15 cm  He feels like he does not get enough pressure  He wants to go back to a set pressure of 10 cm like he used to use  Please advise, thanks!   No DL available on the 5-15 cm

## 2022-01-31 NOTE — Telephone Encounter (Signed)
Ok to change CPAP to fixed pressure 10 cwp Thanks

## 2022-02-01 ENCOUNTER — Other Ambulatory Visit: Payer: Self-pay | Admitting: Internal Medicine

## 2022-02-05 ENCOUNTER — Other Ambulatory Visit: Payer: Self-pay | Admitting: Internal Medicine

## 2022-02-05 DIAGNOSIS — L304 Erythema intertrigo: Secondary | ICD-10-CM | POA: Diagnosis not present

## 2022-02-05 DIAGNOSIS — L570 Actinic keratosis: Secondary | ICD-10-CM | POA: Diagnosis not present

## 2022-02-05 DIAGNOSIS — L308 Other specified dermatitis: Secondary | ICD-10-CM | POA: Diagnosis not present

## 2022-02-05 DIAGNOSIS — L820 Inflamed seborrheic keratosis: Secondary | ICD-10-CM | POA: Diagnosis not present

## 2022-02-05 DIAGNOSIS — Z85828 Personal history of other malignant neoplasm of skin: Secondary | ICD-10-CM | POA: Diagnosis not present

## 2022-02-05 DIAGNOSIS — L821 Other seborrheic keratosis: Secondary | ICD-10-CM | POA: Diagnosis not present

## 2022-02-05 DIAGNOSIS — L812 Freckles: Secondary | ICD-10-CM | POA: Diagnosis not present

## 2022-02-06 NOTE — Telephone Encounter (Signed)
Spoke with the pt and notified of response per Dr Annamaria Boots. He verbalized understanding. Order sent. Nothing further needed.

## 2022-02-08 ENCOUNTER — Other Ambulatory Visit: Payer: Self-pay | Admitting: Internal Medicine

## 2022-02-08 DIAGNOSIS — E785 Hyperlipidemia, unspecified: Secondary | ICD-10-CM

## 2022-02-17 DIAGNOSIS — G4733 Obstructive sleep apnea (adult) (pediatric): Secondary | ICD-10-CM | POA: Diagnosis not present

## 2022-03-04 ENCOUNTER — Telehealth: Payer: Self-pay | Admitting: Internal Medicine

## 2022-03-04 DIAGNOSIS — G4733 Obstructive sleep apnea (adult) (pediatric): Secondary | ICD-10-CM

## 2022-03-04 NOTE — Telephone Encounter (Signed)
Patient states the new machine is not working as good- states 5cm makes him feel like he is suffocating. Patient is needing more air than just 10cm, patient wants a range from maybe 10-18cm.  Please advise- okay to leave voicemail.

## 2022-03-04 NOTE — Telephone Encounter (Signed)
Ok to order his DME to please change PAP setting to auto 10-20  He can adjust his own humidifier. Directions are in his booklet that came with his machine, or he can call his DME and ask how to change it.

## 2022-03-04 NOTE — Telephone Encounter (Signed)
Called and spoke with patient. Patient stated that he feels like he is suffocating with his settings. Patient wants to know if he could get a prescription for 10-20 cm. Patient stated that his machine isn't using much water as it should be and says that he thinks that should be changed too.   CY, please advise.

## 2022-03-04 NOTE — Telephone Encounter (Signed)
Called and spoke with patient. He verbalized understanding. DME order has been placed.   Nothing further needed.

## 2022-03-07 ENCOUNTER — Ambulatory Visit: Payer: Medicare Other | Admitting: Internal Medicine

## 2022-03-07 NOTE — Progress Notes (Deleted)
Patient ID: Joseph Villa, male   DOB: 11/21/1940, 81 y.o.   MRN: [id number]  HPI: Joseph Villa is a 81 y.o.-year-old male, returning for follow-up for DM2, dx in 2007, non-insulin-dependent, uncontrolled, with complications (stroke, PAD, PN, CKD). Pt. previously saw Dr. Loanne Drilling, last visit 6 months ago.  Reviewed HbA1c: Lab Results  Component Value Date   HGBA1C 7.2 (H) 01/04/2022   HGBA1C 6.5 (A) 08/14/2021   HGBA1C 7.4 (H) 06/05/2021   HGBA1C 6.4 (A) 03/19/2021   HGBA1C 7.1 (H) 11/08/2020   HGBA1C 6.9 (H) 10/23/2020   HGBA1C 7.2 (H) 05/22/2020   HGBA1C 7.0 (H) 11/15/2019   HGBA1C 6.2 05/17/2019   HGBA1C 6.9 (H) 11/09/2018   Pt is on a regimen of: - Metformin 1000 mg 2x a day, with meals - Bromocriptine 2.5 mg daily in a.m. - Farxiga 10 mg daily in a.m. - Starlix 90 mg (1.5 tab of 60 mg) 3 times a day - Januvia 50 mg daily in a.m. Metformin caused diarrhea. Repaglinide caused constipation  Pt checks his sugars *** a day and they are: - am: n/c - 2h after b'fast: n/c - before lunch: n/c - 2h after lunch: n/c - before dinner: n/c - 2h after dinner: n/c - bedtime: n/c - nighttime: n/c Lowest sugar was ***; he has hypoglycemia awareness at 70.  Highest sugar was ***.  Glucometer: Accu-Chek guide  Pt's meals are: - Breakfast: - Lunch: - Dinner: - Snacks:  - + CKD, last BUN/creatinine:  Lab Results  Component Value Date   BUN 38 (H) 01/04/2022   BUN 37 (H) 06/05/2021   CREATININE 1.73 (H) 01/04/2022   CREATININE 1.92 (H) 06/05/2021  She is not on an ACE inhibitor or ARB.  - last set of lipids: Lab Results  Component Value Date   CHOL 119 01/04/2022   HDL 46.30 01/04/2022   LDLCALC 118 (H) 11/21/2020   LDLDIRECT 38.0 01/04/2022   TRIG (H) 01/04/2022    436.0 Triglyceride is over 400; calculations on Lipids are invalid.   CHOLHDL 3 01/04/2022  On Repatha, omega-3 fatty acids, Zetia.  - last eye exam was in 09/29/2020. No DR.   - no numbness and  tingling in his feet.  Last foot exam 03/19/2021.  He has a history of HTN, OSA-on CPAP, nephrolithiasis, DJD, GERD, peptic esophageal strictures, and history of hypothyroidism after surgery for goiter at age 22.   Latest TSH reviewed: Lab Results  Component Value Date   TSH 2.35 06/05/2021    ROS: + see HPI No increased urination, blurry vision, nausea, chest pain.  Past Medical History:  Diagnosis Date   ALLERGIC ASTHMA 11/25/2007   ALLERGIC RHINITIS 11/25/2007   Allergy    Arthritis, lumbar spine 04/18/2011   BIPOLAR DISORDER UNSPECIFIED 10/06/2009   Blood transfusion without reported diagnosis    s/p goiter surgery age 81    COLONIC POLYPS, HX OF 10/06/2009   Degenerative arthritis of hip 04/18/2011   DJD (degenerative joint disease)    DM w/o Complication Type II 79/29/4765   GERD 10/06/2009   Hemorrhage yrs ago   after goiter surgery, tracheostomy inserted and later removed   Hemorrhoid    History of kidney stones    HYPERLIPIDEMIA 10/06/2009   HYPERTENSION, BENIGN 07/27/2009   HYPOTHYROIDISM 10/06/2009   NEPHROLITHIASIS, HX OF 10/06/2009   OBSTRUCTIVE SLEEP APNEA 11/25/2007   cpap setting of 9   Peptic stricture of esophagus    RBBB (right bundle branch block)  Sleep apnea    wears c pap   Stroke [facility])    Past Surgical History:  Procedure Laterality Date   COLONOSCOPY     GANGLION CYST EXCISION     2-3 cysts removed   HERNIA REPAIR     JOINT REPLACEMENT     thyroid Goiter surgery     TONSILLECTOMY     TONSILLECTOMY     TOTAL HIP ARTHROPLASTY  06/16/2012   Procedure: TOTAL HIP ARTHROPLASTY ANTERIOR APPROACH;  Surgeon: Mauri Pole, MD;  Location[facility];  Service: Orthopedics;  Laterality: Left;   TRANSCAROTID ARTERY REVASCULARIZATION  Left 11/20/2020   Procedure: LEFT TRANSCAROTID ARTERY REVASCULARIZATION;  Surgeon: Marty Heck, MD;  Location: [facility];  Service: Vascular;  Laterality: Left;   UMBILICAL HERNIA REPAIR  several yrs ago   UPPER  GASTROINTESTINAL ENDOSCOPY     Social History   Socioeconomic History   Marital status: Married    Spouse name: Not on file   Number of children: 3   Years of education: Not on file   Highest education level: Not on file  Occupational History   Occupation: retired  Tobacco Use   Smoking status: Former    Packs/day: 1.50    Years: 15.00    Total pack years: 22.50    Types: Cigarettes, Pipe    Quit date: 06/23/1977    Years since quitting: 44.7   Smokeless tobacco: Never  Vaping Use   Vaping Use: Never used  Substance and Sexual Activity   Alcohol use: No    Alcohol/week: 0.0 standard drinks of alcohol   Drug use: No   Sexual activity: Not Currently  Other Topics Concern   Not on file  Social History Narrative   Not on file   Social Determinants of Health   Financial Resource Strain: Low Risk  (06/11/2021)   Overall Financial Resource Strain (CARDIA)    Difficulty of Paying Living Expenses: Not hard at all  Food Insecurity: No Food Insecurity (06/11/2021)   Hunger Vital Sign    Worried About Running Out of Food in the Last Year: Never true    [place] in the Last Year: Never true  Transportation Needs: No Transportation Needs (06/11/2021)   PRAPARE - Hydrologist (Medical): No    Lack of Transportation (Non-Medical): No  Physical Activity: Not on file  Stress: Not on file  Social Connections: Not on file  Intimate Partner Violence: Not on file   Current Outpatient Medications on File Prior to Visit  Medication Sig Dispense Refill   Accu-Chek FastClix Lancets MISC USE TWICE DAILY 102 each 2   ACCU-CHEK GUIDE test strip CHECK BLOOD GLUCOSE (SUGAR) TWICE DAILY 200 each 3   albuterol (VENTOLIN HFA) 108 (90 Base) MCG/ACT inhaler INHALE 2 PUFFS 4 TIMES DAILY AS NEEDED (Patient not taking: Reported on 01/01/2022) 8.5 g 0   amLODipine (NORVASC) 10 MG tablet TAKE ONE TABLET EACH DAY 90 tablet 3   aspirin 81 MG chewable tablet Chew 1 tablet (81  mg total) by mouth daily. 90 tablet 3   bromocriptine (PARLODEL) 2.5 MG tablet Take 0.5 tablets (1.25 mg total) by mouth daily. 45 tablet 3   carbamazepine (TEGRETOL) 200 MG tablet TAKE 2 TABLETS AT BEDTIME 180 tablet 1   Cholecalciferol (VITAMIN D3) 2000 UNITS TABS Take 2,000 Units by mouth every morning.      clopidogrel (PLAVIX) 75 MG tablet TAKE ONE TABLET BY MOUTH EVERY DAY 30  tablet 6   dapagliflozin propanediol (FARXIGA) 10 MG TABS tablet Take 1 tablet (10 mg total) by mouth daily before breakfast. 30 tablet 2   desoximetasone (TOPICORT) 0.25 % cream Apply 1 application topically 2 (two) times daily. (Patient taking differently: Apply 1 application  topically 2 (two) times daily as needed (psoriasis).) 100 g 0   ezetimibe (ZETIA) 10 MG tablet TAKE ONE TABLET BY MOUTH EVERY DAY 30 tablet 6   fluticasone (FLONASE) 50 MCG/ACT nasal spray ONE SPRAY INTO NOSE EVERY DAY (Patient taking differently: Place 2 sprays into both nostrils daily as needed for rhinitis or allergies.) 48 g 1   Glucosamine HCl (GLUCOSAMINE PO) Take 1 tablet by mouth daily.     glucose blood (ACCU-CHEK AVIVA PLUS) test strip Used to check blood sugars twice a day Dx E11.9 200 each 0   glucose blood test strip Use to check blood sugar 2 times per day. DX Code: E11.9 200 each 2   ketoconazole (NIZORAL) 2 % cream Apply topically daily.     Lancets Misc. (ACCU-CHEK FASTCLIX LANCET) KIT AS DIRECTED 1 kit 0   levothyroxine (SYNTHROID) 112 MCG tablet TAKE ONE TABLET EACH DAY 90 tablet 2   losartan (COZAAR) 50 MG tablet Take 1 tablet (50 mg total) by mouth daily. 90 tablet 3   Multiple Vitamins-Minerals (PRESERVISION AREDS 2 PO) Take 1 tablet by mouth daily.     nateglinide (STARLIX) 60 MG tablet TAKE ONE-HALF TABLET 3 TIMES DAILY WITH MEALS 135 tablet 0   Omega-3 Fatty Acids (FISH OIL) 1200 MG CAPS Take 1 capsule by mouth daily.     pantoprazole (PROTONIX) 40 MG tablet TAKE ONE TABLET DAILY 90 tablet 2   PROCTO-MED HC 2.5 % rectal  cream APPLY TWICE DAILY RECTALLY 28 g 1   REPATHA SURECLICK 010 MG/ML SOAJ INJECT 1 DOSE INTO THE SKIN EVERY 14 DAYS 2 mL 11   Semaglutide (RYBELSUS) 3 MG TABS Take 3 mg by mouth daily. 90 tablet 3   sitaGLIPtin (JANUVIA) 50 MG tablet Take 1 tablet (50 mg total) by mouth daily. 90 tablet 3   tamsulosin (FLOMAX) 0.4 MG CAPS capsule TAKE ONE CAPSULE EACH DAY 90 capsule 3   TURMERIC CURCUMIN PO Take 500 mg by mouth daily.     vitamin B-12 (CYANOCOBALAMIN) 1000 MCG tablet Take 1,000 mcg by mouth daily.     No current facility-administered medications on file prior to visit.   Allergies  Allergen Reactions   Cefuroxime Axetil Anaphylaxis    REACTION: anaphylaxis-ceftin    Cefprozil     REACTION: unknown   Cephalosporins Nausea Only   Crestor [Rosuvastatin Calcium]     Muscle aches    Grass Pollen(K-O-R-T-Swt Vern)    Lipitor [Atorvastatin]     myalgiaas   Metformin And Related Diarrhea   Rabeprazole Sodium Nausea Only    REACTION: nausea   Family History  Problem Relation Age of Onset   Throat cancer Mother    Alcohol abuse Brother    Diabetes Brother    Drug abuse Daughter    Alcoholism Other        uncle   Breast cancer Father    Colon cancer Neg Hx    Colon polyps Neg Hx    Esophageal cancer Neg Hx    Rectal cancer Neg Hx    Stomach cancer Neg Hx    PE: There were no vitals taken for this visit. Wt Readings from Last 3 Encounters:  01/04/22 246 lb (111.6 kg)  01/01/22 247 lb 12.8 oz (112.4 kg)  10/30/21 253 lb 1.6 oz (114.8 kg)   Constitutional: overweight, in NAD Eyes: no exophthalmos ENT: moist mucous membranes, no thyromegaly, no cervical lymphadenopathy Cardiovascular: RRR, No MRG Respiratory: CTA B Musculoskeletal: no deformities Skin: moist, warm, no rashes Neurological: no tremor with outstretched hands  ASSESSMENT: 1. DM2, non-insulin-dependent, uncontrolled, with complications  - Cerebrovascular disease-h/o stroke - PAD - CKD stage 3 - PN  2.  HL  PLAN:  1. Patient with long-standing, uncontrolled diabetes, on oral antidiabetic regimen, with suboptimal control-latest HbA1c obtained 2 months ago was higher, at 7.2%. - I suggested to:  There are no Patient Instructions on file for this visit. - check sugars at different times of the day - check 1x a day, rotating checks - discussed about CBG targets for treatment: 80-130 mg/dL before meals and <180 mg/dL after meals; target HbA1c <7%. - given sugar log and advised how to fill it and to bring it at next appt  - given foot care handout  - given instructions for hypoglycemia management "15-15 rule"  - advised for yearly eye exams  - Return to clinic in 3 mo with sugar log   2. HL - Reviewed latest lipid panel from 01/2022: LDL at goal, lower than 55, triglycerides high: Lab Results  Component Value Date   CHOL 119 01/04/2022   HDL 46.30 01/04/2022   LDLCALC 118 (H) 11/21/2020   LDLDIRECT 38.0 01/04/2022   TRIG (H) 01/04/2022    436.0 Triglyceride is over 400; calculations on Lipids are invalid.   CHOLHDL 3 01/04/2022  - Continues Zetia 10 mg daily, Repatha, and omega-3 fatty acids 1200 mg daily without side effects.   Philemon Kingdom, MD PhD [facility] Endocrinology

## 2022-03-08 ENCOUNTER — Emergency Department (HOSPITAL_BASED_OUTPATIENT_CLINIC_OR_DEPARTMENT_OTHER)
Admission: EM | Admit: 2022-03-08 | Discharge: 2022-03-08 | Disposition: A | Discharge status: Home / Self Care | Payer: Medicare Other | Attending: Emergency Medicine | Admitting: Emergency Medicine

## 2022-03-08 ENCOUNTER — Other Ambulatory Visit: Payer: Self-pay

## 2022-03-08 ENCOUNTER — Encounter (HOSPITAL_BASED_OUTPATIENT_CLINIC_OR_DEPARTMENT_OTHER): Payer: Self-pay | Admitting: Emergency Medicine

## 2022-03-08 DIAGNOSIS — T63301A Toxic effect of unspecified spider venom, accidental (unintentional), initial encounter: Secondary | ICD-10-CM | POA: Diagnosis not present

## 2022-03-08 MED ORDER — AMOXICILLIN 500 MG PO CAPS: 500.0000 mg | ORAL_CAPSULE | Freq: Three times a day (TID) | ORAL | 0 refills | Status: DC

## 2022-03-08 NOTE — ED Provider Notes (Signed)
[facility] EMERGENCY DEPT Provider Note   CSN: [id number] Arrival date & time: 03/08/22  1309     History Chief Complaint  Patient presents with   Wound Infection    HPI Joseph Villa is a 81 y.o. male presenting for chief complaint of spider bite to the right ankle.  Patient states that he was using his work truck which parks nearby pile of ivy and he felt a bite to his right ankle while he was driving.  He was unable to visualize because he was actively driving. Patient denies fevers or chills/nausea vomiting, syncope or shortness of breath.  Patient was ambulatory tolerating p.o. intake.  He has history of diabetes but has reassuring A1c recently.  Patient's recorded medical, surgical, social, medication list and allergies were reviewed in the Snapshot window as part of the initial history.   Review of Systems   Review of Systems  Constitutional:  Negative for chills and fever.  HENT:  Negative for ear pain and sore throat.   Eyes:  Negative for pain and visual disturbance.  Respiratory:  Negative for cough and shortness of breath.   Cardiovascular:  Negative for chest pain and palpitations.  Gastrointestinal:  Negative for abdominal pain and vomiting.  Genitourinary:  Negative for dysuria and hematuria.  Musculoskeletal:  Negative for arthralgias and back pain.  Skin:  Positive for wound. Negative for color change and rash.  Neurological:  Negative for seizures and syncope.  All other systems reviewed and are negative.   Physical Exam Updated Vital Signs BP (!) 140/103 (BP Location: Right Arm)   Pulse 74   Temp 98 F (36.7 C) (Temporal)   Resp 18   Ht '5\' 11"'$  (1.803 m)   Wt 113.4 kg   SpO2 96%   BMI 34.87 kg/m  Physical Exam Vitals and nursing note reviewed.  Constitutional:      General: He is not in acute distress.    Appearance: He is well-developed.  HENT:     Head: Normocephalic and atraumatic.  Eyes:     Conjunctiva/sclera:  Conjunctivae normal.  Cardiovascular:     Rate and Rhythm: Normal rate and regular rhythm.     Heart sounds: No murmur heard. Pulmonary:     Effort: Pulmonary effort is normal. No respiratory distress.     Breath sounds: Normal breath sounds.  Abdominal:     Palpations: Abdomen is soft.     Tenderness: There is no abdominal tenderness.  Musculoskeletal:        General: No swelling.     Cervical back: Neck supple.  Skin:    General: Skin is warm and dry.     Capillary Refill: Capillary refill takes less than 2 seconds.     Findings: Lesion present.  Neurological:     Mental Status: He is alert.  Psychiatric:        Mood and Affect: Mood normal.       ED Course/ Medical Decision Making/ A&P    Procedures Procedures   Medications Ordered in ED Medications - No data to display  Medical Decision Making:    Joseph Villa is a 81 y.o. male who presented to the ED today with right-sided skin lesion detailed above.     Patient's presentation is complicated by their history of diabetes, advanced age.  Patient placed on continuous vitals and telemetry monitoring while in ED which was reviewed periodically.   Complete initial physical exam performed, notably the patient  was hemodynamically stable  in no acute distress.      Reviewed and confirmed nursing documentation for past medical history, family history, social history.    Initial Assessment:   With the patient's presentation of patient's wound appears most consistent with a brown recluse bite.  Fortunately, we are already 1 week into his symptoms and ulcer appears to be starting to shrink.  However given overlying redness and irritation, concern for periwound cellulitis.  We will start patient on amoxicillin given history of allergy to cephalosporins.  Patient currently ambulatory tolerating p.o. intake in no acute distress, no signs of systemic illness at this time.  Burtis Junes that patient is going to require close outpatient  follow-up with primary care provider within 72 hours for reassessment to ensure ongoing clinical improvement.  Strict return precautions reinforced and patient discharged with no further acute events.  Clinical Impression:  1. Spider bite wound, accidental or unintentional, initial encounter      Discharge   Final Clinical Impression(s) / ED Diagnoses Final diagnoses:  Spider bite wound, accidental or unintentional, initial encounter    Rx / DC Orders ED Discharge Orders          Ordered    amoxicillin (AMOXIL) 500 MG capsule  3 times daily        03/08/22 1513              Tretha Sciara, MD 03/08/22 1519

## 2022-03-08 NOTE — ED Triage Notes (Signed)
Pt arrives to ED with c/o right lower leg wound after possible big bite x1 week. Hx T2DM.

## 2022-03-12 ENCOUNTER — Encounter: Payer: Self-pay | Admitting: Internal Medicine

## 2022-03-12 ENCOUNTER — Ambulatory Visit (INDEPENDENT_AMBULATORY_CARE_PROVIDER_SITE_OTHER): Payer: Medicare Other | Admitting: Internal Medicine

## 2022-03-12 VITALS — BP 130/76 | HR 75 | Temp 98.4°F | Ht 71.0 in | Wt 253.0 lb

## 2022-03-12 DIAGNOSIS — N1832 Chronic kidney disease, stage 3b: Secondary | ICD-10-CM

## 2022-03-12 DIAGNOSIS — Z23 Encounter for immunization: Secondary | ICD-10-CM | POA: Diagnosis not present

## 2022-03-12 DIAGNOSIS — E1122 Type 2 diabetes mellitus with diabetic chronic kidney disease: Secondary | ICD-10-CM

## 2022-03-12 DIAGNOSIS — E1159 Type 2 diabetes mellitus with other circulatory complications: Secondary | ICD-10-CM | POA: Diagnosis not present

## 2022-03-12 DIAGNOSIS — I152 Hypertension secondary to endocrine disorders: Secondary | ICD-10-CM | POA: Diagnosis not present

## 2022-03-12 DIAGNOSIS — N182 Chronic kidney disease, stage 2 (mild): Secondary | ICD-10-CM | POA: Diagnosis not present

## 2022-03-12 DIAGNOSIS — T63301A Toxic effect of unspecified spider venom, accidental (unintentional), initial encounter: Secondary | ICD-10-CM | POA: Diagnosis not present

## 2022-03-12 MED ORDER — DOXYCYCLINE HYCLATE 100 MG PO TABS: 100.0000 mg | ORAL_TABLET | Freq: Two times a day (BID) | ORAL | 0 refills | Status: DC

## 2022-03-12 NOTE — Patient Instructions (Signed)
Ok to change the amoxil to doxycycline antibiotic  Please continue the bacitracin and dressing changes  Please call if not improving in the next week, for referral to Wound Clinic  Please continue all other medications as before, and refills have been done if requested.  Please have the pharmacy call with any other refills you may need.  Please keep your appointments with your specialists as you may have planned

## 2022-03-12 NOTE — Assessment & Plan Note (Signed)
Lab Results  Component Value Date   CREATININE 1.73 (H) 01/04/2022   Stable overall, cont to avoid nephrotoxins

## 2022-03-12 NOTE — Assessment & Plan Note (Signed)
BP Readings from Last 3 Encounters:  03/12/22 130/76  03/08/22 (!) 140/103  01/04/22 (!) 142/64   Stable, pt to continue medical treatment norvasc 10 mg, losartan 50 mg qd

## 2022-03-12 NOTE — Assessment & Plan Note (Signed)
Lab Results  Component Value Date   HGBA1C 7.2 (H) 01/04/2022   Pt decided to hold on taking the rybelsus, mild uncontrolled with obesity,, pt to continue current medical treatment farxiga 10 qd, starlix 30 tid, januvia 50 qd

## 2022-03-12 NOTE — Assessment & Plan Note (Signed)
Now with ;possible worsening infection, for change amoxil to doxycycline course, continue dressing changes and bacitracin topical, consider wound clinic if not improving

## 2022-03-12 NOTE — Progress Notes (Signed)
Patient ID: Joseph Villa, male   DOB: 08-02-1940, 81 y.o.   MRN: [id number]        Chief Complaint: follow up spider bite worsening, DM, htn       HPI:  Joseph Villa is a 81 y.o. male here after unfortunately suffering spider bite presumed brown recluse to lateral right distal leg at the ankle, seen and tx at ED oct 6 with amoxil, but unfortunately pain, red, swelling worsening.  Denies fever, chills, red streaks or drainage.  Good compliance with daily dressing changes and bacitracin topical.  Pt denies chest pain, increased sob or doe, wheezing, orthopnea, PND, increased LE swelling, palpitations, dizziness or syncope.   Pt denies polydipsia, polyuria, or new focal neuro s/s.    Pt denies fever, wt loss, night sweats, loss of appetite, or other constitutional symptoms  Never took the Rybelsus after reading the possible side effects, does not want to start now.         Wt Readings from Last 3 Encounters:  03/12/22 253 lb (114.8 kg)  03/08/22 250 lb (113.4 kg)  01/04/22 246 lb (111.6 kg)   BP Readings from Last 3 Encounters:  03/12/22 130/76  03/08/22 (!) 140/103  01/04/22 (!) 142/64         Past Medical History:  Diagnosis Date   ALLERGIC ASTHMA 11/25/2007   ALLERGIC RHINITIS 11/25/2007   Allergy    Arthritis, lumbar spine 04/18/2011   BIPOLAR DISORDER UNSPECIFIED 10/06/2009   Blood transfusion without reported diagnosis    s/p goiter surgery age 21    COLONIC POLYPS, HX OF 10/06/2009   Degenerative arthritis of hip 04/18/2011   DJD (degenerative joint disease)    DM w/o Complication Type II 97/35/3299   GERD 10/06/2009   Hemorrhage yrs ago   after goiter surgery, tracheostomy inserted and later removed   Hemorrhoid    History of kidney stones    HYPERLIPIDEMIA 10/06/2009   HYPERTENSION, BENIGN 07/27/2009   HYPOTHYROIDISM 10/06/2009   NEPHROLITHIASIS, HX OF 10/06/2009   OBSTRUCTIVE SLEEP APNEA 11/25/2007   cpap setting of 9   Peptic stricture of esophagus    RBBB  (right bundle branch block)    Sleep apnea    wears c pap   Stroke [facility])    Past Surgical History:  Procedure Laterality Date   COLONOSCOPY     GANGLION CYST EXCISION     2-3 cysts removed   HERNIA REPAIR     JOINT REPLACEMENT     thyroid Goiter surgery     TONSILLECTOMY     TONSILLECTOMY     TOTAL HIP ARTHROPLASTY  06/16/2012   Procedure: TOTAL HIP ARTHROPLASTY ANTERIOR APPROACH;  Surgeon: Mauri Pole, MD;  Location: [facility];  Service: Orthopedics;  Laterality: Left;   TRANSCAROTID ARTERY REVASCULARIZATION  Left 11/20/2020   Procedure: LEFT TRANSCAROTID ARTERY REVASCULARIZATION;  Surgeon: Marty Heck, MD;  Location: [facility];  Service: Vascular;  Laterality: Left;   UMBILICAL HERNIA REPAIR  several yrs ago   UPPER GASTROINTESTINAL ENDOSCOPY      reports that he quit smoking about 44 years ago. His smoking use included cigarettes and pipe. He has a 22.50 pack-year smoking history. He has never used smokeless tobacco. He reports that he does not drink alcohol and does not use drugs. family history includes Alcohol abuse in his brother; Alcoholism in an other family member; Breast cancer in his father; Diabetes in his brother; Drug abuse in his daughter; Throat cancer in his  mother. Allergies  Allergen Reactions   Cefuroxime Axetil Anaphylaxis    REACTION: anaphylaxis-ceftin    Cefprozil     REACTION: unknown   Cephalosporins Nausea Only   Crestor [Rosuvastatin Calcium]     Muscle aches    Grass Pollen(K-O-R-T-Swt Vern)    Lipitor [Atorvastatin]     myalgiaas   Metformin And Related Diarrhea   Rabeprazole Sodium Nausea Only    REACTION: nausea   Current Outpatient Medications on File Prior to Visit  Medication Sig Dispense Refill   Accu-Chek FastClix Lancets MISC USE TWICE DAILY 102 each 2   ACCU-CHEK GUIDE test strip CHECK BLOOD GLUCOSE (SUGAR) TWICE DAILY 200 each 3   albuterol (VENTOLIN HFA) 108 (90 Base) MCG/ACT inhaler INHALE 2 PUFFS 4 TIMES DAILY AS NEEDED 8.5  g 0   amLODipine (NORVASC) 10 MG tablet TAKE ONE TABLET EACH DAY 90 tablet 3   aspirin 81 MG chewable tablet Chew 1 tablet (81 mg total) by mouth daily. 90 tablet 3   bromocriptine (PARLODEL) 2.5 MG tablet Take 0.5 tablets (1.25 mg total) by mouth daily. 45 tablet 3   carbamazepine (TEGRETOL) 200 MG tablet TAKE 2 TABLETS AT BEDTIME 180 tablet 1   Cholecalciferol (VITAMIN D3) 2000 UNITS TABS Take 2,000 Units by mouth every morning.      clopidogrel (PLAVIX) 75 MG tablet TAKE ONE TABLET BY MOUTH EVERY DAY 30 tablet 6   dapagliflozin propanediol (FARXIGA) 10 MG TABS tablet Take 1 tablet (10 mg total) by mouth daily before breakfast. 30 tablet 2   desoximetasone (TOPICORT) 0.25 % cream Apply 1 application topically 2 (two) times daily. (Patient taking differently: Apply 1 application  topically 2 (two) times daily as needed (psoriasis).) 100 g 0   ezetimibe (ZETIA) 10 MG tablet TAKE ONE TABLET BY MOUTH EVERY DAY 30 tablet 6   fluticasone (FLONASE) 50 MCG/ACT nasal spray ONE SPRAY INTO NOSE EVERY DAY (Patient taking differently: Place 2 sprays into both nostrils daily as needed for rhinitis or allergies.) 48 g 1   Glucosamine HCl (GLUCOSAMINE PO) Take 1 tablet by mouth daily.     glucose blood (ACCU-CHEK AVIVA PLUS) test strip Used to check blood sugars twice a day Dx E11.9 200 each 0   glucose blood test strip Use to check blood sugar 2 times per day. DX Code: E11.9 200 each 2   ketoconazole (NIZORAL) 2 % cream Apply topically daily.     Lancets Misc. (ACCU-CHEK FASTCLIX LANCET) KIT AS DIRECTED 1 kit 0   levothyroxine (SYNTHROID) 112 MCG tablet TAKE ONE TABLET EACH DAY 90 tablet 2   losartan (COZAAR) 50 MG tablet Take 1 tablet (50 mg total) by mouth daily. 90 tablet 3   Multiple Vitamins-Minerals (PRESERVISION AREDS 2 PO) Take 1 tablet by mouth daily.     nateglinide (STARLIX) 60 MG tablet TAKE ONE-HALF TABLET 3 TIMES DAILY WITH MEALS 135 tablet 0   Omega-3 Fatty Acids (FISH OIL) 1200 MG CAPS Take 1  capsule by mouth daily.     pantoprazole (PROTONIX) 40 MG tablet TAKE ONE TABLET DAILY 90 tablet 2   PROCTO-MED HC 2.5 % rectal cream APPLY TWICE DAILY RECTALLY 28 g 1   REPATHA SURECLICK 893 MG/ML SOAJ INJECT 1 DOSE INTO THE SKIN EVERY 14 DAYS 2 mL 11   sitaGLIPtin (JANUVIA) 50 MG tablet Take 1 tablet (50 mg total) by mouth daily. 90 tablet 3   tamsulosin (FLOMAX) 0.4 MG CAPS capsule TAKE ONE CAPSULE EACH DAY 90 capsule 3  TURMERIC CURCUMIN PO Take 500 mg by mouth daily.     vitamin B-12 (CYANOCOBALAMIN) 1000 MCG tablet Take 1,000 mcg by mouth daily.     No current facility-administered medications on file prior to visit.        ROS:  All others reviewed and negative.  Objective        PE:  BP 130/76 (BP Location: Right Arm, Patient Position: Sitting, Cuff Size: Normal)   Pulse 75   Temp 98.4 F (36.9 C) (Oral)   Ht '5\' 11"'  (1.803 m)   Wt 253 lb (114.8 kg)   SpO2 98%   BMI 35.29 kg/m                 Constitutional: Pt appears in NAD               HENT: Head: NCAT.                Right Ear: External ear normal.                 Left Ear: External ear normal.                Eyes: . Pupils are equal, round, and reactive to light. Conjunctivae and EOM are normal               Nose: without d/c or deformity               Neck: Neck supple. Gross normal ROM               Cardiovascular: Normal rate and regular rhythm.                 Pulmonary/Chest: Effort normal and breath sounds without rales or wheezing.                Abd:  Soft, NT, ND, + BS, no organomegaly               Neurological: Pt is alert. At baseline orientation, motor grossly intact               Skin: LE edema - none; right lateral distal leg just prox to the ankle with quarter sized lesion with darkened venom damaged skin, with surrounding red, tender swelling less than 1 cm about.  No red streaks.                 Psychiatric: Pt behavior is normal without agitation   Micro: none  Cardiac tracings I have  personally interpreted today:  none  Pertinent Radiological findings (summarize): none   Lab Results  Component Value Date   WBC 7.9 06/05/2021   HGB 11.4 (L) 06/05/2021   HCT 35.2 (L) 06/05/2021   PLT 240.0 06/05/2021   GLUCOSE 191 (H) 01/04/2022   CHOL 119 01/04/2022   TRIG (H) 01/04/2022    436.0 Triglyceride is over 400; calculations on Lipids are invalid.   HDL 46.30 01/04/2022   LDLDIRECT 38.0 01/04/2022   LDLCALC 118 (H) 11/21/2020   ALT 18 01/04/2022   AST 17 01/04/2022   NA 138 01/04/2022   K 4.2 01/04/2022   CL 102 01/04/2022   CREATININE 1.73 (H) 01/04/2022   BUN 38 (H) 01/04/2022   CO2 26 01/04/2022   TSH 2.35 06/05/2021   PSA 0.84 10/23/2020   INR 1.0 11/16/2020   HGBA1C 7.2 (H) 01/04/2022   MICROALBUR 13.3 (H) 10/23/2020   Assessment/Plan:  JEFFERSON FULLAM is a  81 y.o. White or Caucasian [1] male with  has a past medical history of ALLERGIC ASTHMA (11/25/2007), ALLERGIC RHINITIS (11/25/2007), Allergy, Arthritis, lumbar spine (04/18/2011), BIPOLAR DISORDER UNSPECIFIED (10/06/2009), Blood transfusion without reported diagnosis, COLONIC POLYPS, HX OF (10/06/2009), Degenerative arthritis of hip (04/18/2011), DJD (degenerative joint disease), DM w/o Complication Type II (84/05/8207), GERD (10/06/2009), Hemorrhage (yrs ago), Hemorrhoid, History of kidney stones, HYPERLIPIDEMIA (10/06/2009), HYPERTENSION, BENIGN (07/27/2009), HYPOTHYROIDISM (10/06/2009), NEPHROLITHIASIS, HX OF (10/06/2009), OBSTRUCTIVE SLEEP APNEA (11/25/2007), Peptic stricture of esophagus, RBBB (right bundle branch block), Sleep apnea, and Stroke ([place]).  Spider bite, venomous Now with ;possible worsening infection, for change amoxil to doxycycline course, continue dressing changes and bacitracin topical, consider wound clinic if not improving  Type 2 diabetes mellitus ([place]) Lab Results  Component Value Date   HGBA1C 7.2 (H) 01/04/2022   Pt decided to hold on taking the rybelsus, mild uncontrolled  with obesity,, pt to continue current medical treatment farxiga 10 qd, starlix 30 tid, januvia 50 qd   Hypertension associated with diabetes ([place]) BP Readings from Last 3 Encounters:  03/12/22 130/76  03/08/22 (!) 140/103  01/04/22 (!) 142/64   Stable, pt to continue medical treatment norvasc 10 mg, losartan 50 mg qd   CKD (chronic kidney disease), stage III ([place]) Lab Results  Component Value Date   CREATININE 1.73 (H) 01/04/2022   Stable overall, cont to avoid nephrotoxins  Followup: No follow-ups on file.  Cathlean Cower, MD 03/12/2022 5:00 PM [facility]

## 2022-03-13 ENCOUNTER — Telehealth: Payer: Self-pay

## 2022-03-13 NOTE — Telephone Encounter (Signed)
     Reason for call: ED-Follow up call   Patient  visited [facility] on 03/08/2022   Telephone encounter attempt :  1st Attempt  A HIPAA compliant voice message was left requesting a return call.  Instructed patient to call back at [phone] at their earliest convenience.  [place] management  [place], [place]  Main Phone: [phone]  E-mail: [email or web address]  Website: [email or web address]

## 2022-03-14 ENCOUNTER — Telehealth: Payer: Self-pay

## 2022-03-14 NOTE — Telephone Encounter (Signed)
Patient's wife is calling in stating that Dashon forgot how often they are needing to change the bandages and clean the wound. Wondering if they can get a call back.

## 2022-03-15 ENCOUNTER — Encounter: Payer: Self-pay | Admitting: Internal Medicine

## 2022-03-15 ENCOUNTER — Telehealth: Payer: Self-pay

## 2022-03-15 ENCOUNTER — Ambulatory Visit: Payer: Medicare Other | Admitting: Internal Medicine

## 2022-03-15 VITALS — BP 130/76 | HR 66 | Ht 71.0 in | Wt 252.0 lb

## 2022-03-15 DIAGNOSIS — E1122 Type 2 diabetes mellitus with diabetic chronic kidney disease: Secondary | ICD-10-CM

## 2022-03-15 DIAGNOSIS — N1832 Chronic kidney disease, stage 3b: Secondary | ICD-10-CM | POA: Diagnosis not present

## 2022-03-15 DIAGNOSIS — E1142 Type 2 diabetes mellitus with diabetic polyneuropathy: Secondary | ICD-10-CM | POA: Diagnosis not present

## 2022-03-15 LAB — POCT GLYCOSYLATED HEMOGLOBIN (HGB A1C): Hemoglobin A1C: 7.1 % — AB (ref 4.0–5.6)

## 2022-03-15 MED ORDER — DAPAGLIFLOZIN PROPANEDIOL 10 MG PO TABS: 10.0000 mg | ORAL_TABLET | Freq: Every day | ORAL | 3 refills | Status: DC

## 2022-03-15 MED ORDER — BROMOCRIPTINE MESYLATE 2.5 MG PO TABS: 1.2500 mg | ORAL_TABLET | Freq: Every day | ORAL | 3 refills | Status: DC

## 2022-03-15 MED ORDER — SITAGLIPTIN PHOSPHATE 50 MG PO TABS
50.0000 mg | ORAL_TABLET | Freq: Every day | ORAL | 3 refills | Status: DC
Start: 2022-03-15 — End: 2022-09-20

## 2022-03-15 NOTE — Progress Notes (Signed)
Name: Joseph Villa  Age/ Sex: 81 y.o., male   MRN/ DOB: [id number], 1940-09-17     PCP: Biagio Borg, MD   Reason for Endocrinology Evaluation: Type 2 Diabetes Mellitus  Initial Endocrine Consultative Visit: 02/14/2015    PATIENT IDENTIFIER: [name] is a 81 y.o. male with a past medical history of Dm, dyslipidemia . The patient has followed with Endocrinology clinic since 02/14/2015 for consultative assistance with management of his diabetes.  DIABETIC HISTORY:  Joseph Villa was diagnosed with DM 2007. His hemoglobin A1c has ranged from 5.7% in 2018, peaking at 7.9% in 2019.  He was followed by Dr. Loanne Drilling from 2016 until March 2023   SUBJECTIVE:   During the last visit (08/14/2021): saw Dr. Loanne Drilling   Today (03/15/2022): Joseph Villa is here for follow-up on diabetes management.  He checks his blood sugars 1 times daily. The patient has not had hypoglycemic episodes since the last clinic visit   He has been eating cookies  Denies nausea or  vomiting  Has diarrhea that he attributes to Abx intake for spider bite      HOME DIABETES REGIMEN:  Januvia 50 mg daily Farxiga 10 mg daily Bromocriptine 2.5 mg, half a tablet daily     Statin: Patient on Repatha ACE-I/ARB: Yes    METER DOWNLOAD SUMMARY: unable to download 129-156 mg/dL     DIABETIC COMPLICATIONS: Microvascular complications:  CKD III, neuropathy Denies:  Last Eye Exam: Completed 07/27/2021  Macrovascular complications:   Denies: CAD, CVA, PVD   HISTORY:  Past Medical History:  Past Medical History:  Diagnosis Date   ALLERGIC ASTHMA 11/25/2007   ALLERGIC RHINITIS 11/25/2007   Allergy    Arthritis, lumbar spine 04/18/2011   BIPOLAR DISORDER UNSPECIFIED 10/06/2009   Blood transfusion without reported diagnosis    s/p goiter surgery age 85    COLONIC POLYPS, HX OF 10/06/2009   Degenerative arthritis of hip 04/18/2011   DJD (degenerative joint disease)    DM w/o Complication Type II  62/83/1517   GERD 10/06/2009   Hemorrhage yrs ago   after goiter surgery, tracheostomy inserted and later removed   Hemorrhoid    History of kidney stones    HYPERLIPIDEMIA 10/06/2009   HYPERTENSION, BENIGN 07/27/2009   HYPOTHYROIDISM 10/06/2009   NEPHROLITHIASIS, HX OF 10/06/2009   OBSTRUCTIVE SLEEP APNEA 11/25/2007   cpap setting of 9   Peptic stricture of esophagus    RBBB (right bundle branch block)    Sleep apnea    wears c pap   Stroke [facility])    Past Surgical History:  Past Surgical History:  Procedure Laterality Date   COLONOSCOPY     GANGLION CYST EXCISION     2-3 cysts removed   HERNIA REPAIR     JOINT REPLACEMENT     thyroid Goiter surgery     TONSILLECTOMY     TONSILLECTOMY     TOTAL HIP ARTHROPLASTY  06/16/2012   Procedure: TOTAL HIP ARTHROPLASTY ANTERIOR APPROACH;  Surgeon: Mauri Pole, MD;  Location: [facility];  Service: Orthopedics;  Laterality: Left;   TRANSCAROTID ARTERY REVASCULARIZATION  Left 11/20/2020   Procedure: LEFT TRANSCAROTID ARTERY REVASCULARIZATION;  Surgeon: Marty Heck, MD;  Location: [facility];  Service: Vascular;  Laterality: Left;   UMBILICAL HERNIA REPAIR  several yrs ago   UPPER GASTROINTESTINAL ENDOSCOPY     Social History:  reports that he quit smoking about 44 years ago. His smoking use included cigarettes and pipe. He has  a 22.50 pack-year smoking history. He has never used smokeless tobacco. He reports that he does not drink alcohol and does not use drugs. Family History:  Family History  Problem Relation Age of Onset   Throat cancer Mother    Alcohol abuse Brother    Diabetes Brother    Drug abuse Daughter    Alcoholism Other        uncle   Breast cancer Father    Colon cancer Neg Hx    Colon polyps Neg Hx    Esophageal cancer Neg Hx    Rectal cancer Neg Hx    Stomach cancer Neg Hx      HOME MEDICATIONS: Allergies as of 03/15/2022       Reactions   Cefuroxime Axetil Anaphylaxis   REACTION: anaphylaxis-ceftin    Cefprozil    REACTION: unknown   Cephalosporins Nausea Only   Crestor [rosuvastatin Calcium]    Muscle aches    Grass Pollen(k-o-r-t-swt Vern)    Lipitor [atorvastatin]    myalgiaas   Metformin And Related Diarrhea   Rabeprazole Sodium Nausea Only   REACTION: nausea        Medication List        Accurate as of March 15, 2022  8:47 AM. If you have any questions, ask your nurse or doctor.          Accu-Chek FastClix Lancet Kit AS DIRECTED   Accu-Chek FastClix Lancets Misc USE TWICE DAILY   albuterol 108 (90 Base) MCG/ACT inhaler Commonly known as: VENTOLIN HFA INHALE 2 PUFFS 4 TIMES DAILY AS NEEDED   amLODipine 10 MG tablet Commonly known as: NORVASC TAKE ONE TABLET EACH DAY   aspirin 81 MG chewable tablet Chew 1 tablet (81 mg total) by mouth daily.   bromocriptine 2.5 MG tablet Commonly known as: PARLODEL Take 0.5 tablets (1.25 mg total) by mouth daily.   carbamazepine 200 MG tablet Commonly known as: TEGRETOL TAKE 2 TABLETS AT BEDTIME   clopidogrel 75 MG tablet Commonly known as: PLAVIX TAKE ONE TABLET BY MOUTH EVERY DAY   cyanocobalamin 1000 MCG tablet Commonly known as: VITAMIN B12 Take 1,000 mcg by mouth daily.   dapagliflozin propanediol 10 MG Tabs tablet Commonly known as: Farxiga Take 1 tablet (10 mg total) by mouth daily before breakfast.   desoximetasone 0.25 % cream Commonly known as: TOPICORT Apply 1 application topically 2 (two) times daily. What changed:  when to take this reasons to take this   doxycycline 100 MG tablet Commonly known as: VIBRA-TABS Take 1 tablet (100 mg total) by mouth 2 (two) times daily.   ezetimibe 10 MG tablet Commonly known as: ZETIA TAKE ONE TABLET BY MOUTH EVERY DAY   Fish Oil 1200 MG Caps Take 1 capsule by mouth daily.   fluticasone 50 MCG/ACT nasal spray Commonly known as: FLONASE ONE SPRAY INTO NOSE EVERY DAY What changed: See the new instructions.   GLUCOSAMINE PO Take 1 tablet by mouth  daily.   glucose blood test strip Use to check blood sugar 2 times per day. DX Code: E11.9   glucose blood test strip Commonly known as: Accu-Chek Aviva Plus Used to check blood sugars twice a day Dx E11.9   Accu-Chek Guide test strip Generic drug: glucose blood CHECK BLOOD GLUCOSE (SUGAR) TWICE DAILY   ketoconazole 2 % cream Commonly known as: NIZORAL Apply topically daily.   levothyroxine 112 MCG tablet Commonly known as: SYNTHROID TAKE ONE TABLET EACH DAY   losartan 50 MG tablet Commonly  known as: COZAAR Take 1 tablet (50 mg total) by mouth daily.   nateglinide 60 MG tablet Commonly known as: STARLIX TAKE ONE-HALF TABLET 3 TIMES DAILY WITH MEALS   pantoprazole 40 MG tablet Commonly known as: PROTONIX TAKE ONE TABLET DAILY   PRESERVISION AREDS 2 PO Take 1 tablet by mouth daily.   Procto-Med HC 2.5 % rectal cream Generic drug: hydrocortisone APPLY TWICE DAILY RECTALLY   Repatha SureClick 161 MG/ML Soaj Generic drug: Evolocumab INJECT 1 DOSE INTO THE SKIN EVERY 14 DAYS   sitaGLIPtin 50 MG tablet Commonly known as: Januvia Take 1 tablet (50 mg total) by mouth daily.   tamsulosin 0.4 MG Caps capsule Commonly known as: FLOMAX TAKE ONE CAPSULE EACH DAY   TURMERIC CURCUMIN PO Take 500 mg by mouth daily.   Vitamin D3 50 MCG (2000 UT) Tabs Take 2,000 Units by mouth every morning.         OBJECTIVE:   Vital Signs: BP 130/76 (BP Location: Left Arm, Patient Position: Sitting, Cuff Size: Large)   Pulse 66   Ht '5\' 11"'  (1.803 m)   Wt 252 lb (114.3 kg)   SpO2 94%   BMI 35.15 kg/m   Wt Readings from Last 3 Encounters:  03/15/22 252 lb (114.3 kg)  03/12/22 253 lb (114.8 kg)  03/08/22 250 lb (113.4 kg)     Exam: General: Pt appears well and is in NAD  Neck: General: Supple without adenopathy. Thyroid: Thyroid size normal.  No goiter or nodules appreciated.   Lungs: Clear with good BS bilat   Heart: RRR   Extremities: No pretibial edema.   Neuro: MS  is good with appropriate affect, pt is alert and Ox3    DM foot exam: 03/15/2022  The skin of the feet is intact without sores or ulcerations. The pedal pulses are 1+ on right and 1+ on left. The sensation is decreased  to a screening 5.07, 10 gram monofilament on the left      DATA REVIEWED:  Lab Results  Component Value Date   HGBA1C 7.1 (A) 03/15/2022   HGBA1C 7.2 (H) 01/04/2022   HGBA1C 6.5 (A) 08/14/2021   Lab Results  Component Value Date   MICROALBUR 13.3 (H) 10/23/2020   LDLCALC 118 (H) 11/21/2020   CREATININE 1.73 (H) 01/04/2022   Lab Results  Component Value Date   MICRALBCREAT 7.0 10/23/2020     Lab Results  Component Value Date   CHOL 119 01/04/2022   HDL 46.30 01/04/2022   LDLCALC 118 (H) 11/21/2020   LDLDIRECT 38.0 01/04/2022   TRIG (H) 01/04/2022    436.0 Triglyceride is over 400; calculations on Lipids are invalid.   CHOLHDL 3 01/04/2022       Old records , labs and images have been reviewed.    ASSESSMENT / PLAN / RECOMMENDATIONS:   1) Type 2 Diabetes Mellitus, Optimally controlled, With neuropathic and CKD III complications - Most recent A1c of 7.1 %. Goal A1c <7.5%.     - Limited glycemic agents due to CKD III - A1c at goal for his age  - No changes at this time   MEDICATIONS: Continue Januvia 50 mg daily  Continue Farxiga 10 mg daily  Continue Bromocriptine 2.5 mg, half a tablet daily   EDUCATION / INSTRUCTIONS: BG monitoring instructions: Patient is instructed to check his blood sugars 1 times a day, fasting . Call [facility] if: BG persistently < 70  I reviewed the Rule of 15 for the treatment  of hypoglycemia in detail with the patient. Literature supplied.    2) Diabetic complications:  Eye: Does not have known diabetic retinopathy.  Neuro/ Feet: Does  have known diabetic peripheral neuropathy .  Renal: Patient does  have known baseline CKD. He   is  on an ACEI/ARB at present.      F/U in 6 months      Signed electronically by: Mack Guise, MD  [facility]  [address] [place], [place] [address] Phone: [phone] FAX: [phone]   CC: Biagio Borg, [place] [address] Phone: [phone]  Fax: [phone]  Return to Endocrinology clinic as below: Future Appointments  Date Time Provider [place]  04/19/2022  9:00 AM Valora Piccolo, Barnabas Lister, RD [place] [facility]

## 2022-03-15 NOTE — Telephone Encounter (Signed)
   Reason for call: ED-Follow up call  Patient  visit on 03/08/2022  at [facility] was for Spider Bite  Have you been able to follow up with your primary care physician? - Yes  The patient was or was not able to obtain any needed medicine or equipment. - Was, Yes  Are there diet recommendations that you are having difficulty following? - No  Patient expresses understanding of discharge instructions and education provided has no other needs at this time.   [facility]  [place], [place]  Main Phone: [phone]  E-mail: [email or web address]  Website: [email or web address]

## 2022-03-15 NOTE — Telephone Encounter (Signed)
Once daily please

## 2022-03-15 NOTE — Telephone Encounter (Signed)
Should he change frequently throughout the day or once daily,AVS did not specify

## 2022-03-15 NOTE — Patient Instructions (Signed)
Continue Januvia 50 mg daily Continue Farxiga 10 mg daily Continue Bromocriptine 2.5 mg, half a tablet daily     HOW TO TREAT LOW BLOOD SUGARS (Blood sugar LESS THAN 70 MG/DL) Please follow the RULE OF 15 for the treatment of hypoglycemia treatment (when your (blood sugars are less than 70 mg/dL)   STEP 1: Take 15 grams of carbohydrates when your blood sugar is low, which includes:  3-4 GLUCOSE TABS  OR 3-4 OZ OF JUICE OR REGULAR SODA OR ONE TUBE OF GLUCOSE GEL    STEP 2: RECHECK blood sugar in 15 MINUTES STEP 3: If your blood sugar is still low at the 15 minute recheck --> then, go back to STEP 1 and treat AGAIN with another 15 grams of carbohydrates.

## 2022-03-19 DIAGNOSIS — G4733 Obstructive sleep apnea (adult) (pediatric): Secondary | ICD-10-CM | POA: Diagnosis not present

## 2022-03-21 ENCOUNTER — Telehealth: Payer: Self-pay | Admitting: Internal Medicine

## 2022-03-21 NOTE — Telephone Encounter (Signed)
PT's spouse calls today following up from a call they had received to get back in touch with [place]. I had let PT know I had not seen anything within the chart regarding a message left for them and PT's spouse said it might have been rated to PT's recent visit. PT's spouse let me know that PT is doing well but that he had not finished the antibiotics yet, bite was still looking iffy, and still soft within the middle. (Due to PT being diabetic they believe this might the case for the slow recovery).  CB if needed: [phone]

## 2022-04-01 ENCOUNTER — Other Ambulatory Visit: Payer: Self-pay | Admitting: Internal Medicine

## 2022-04-01 DIAGNOSIS — G4733 Obstructive sleep apnea (adult) (pediatric): Secondary | ICD-10-CM | POA: Diagnosis not present

## 2022-04-01 NOTE — Telephone Encounter (Signed)
Please refill as per office routine med refill policy (all routine meds to be refilled for 3 mo or monthly (per pt preference) up to one year from last visit, then month to month grace period for 3 mo, then further med refills will have to be denied) ? ?

## 2022-04-19 ENCOUNTER — Encounter: Payer: Self-pay | Admitting: Dietician

## 2022-04-19 ENCOUNTER — Encounter: Payer: Medicare Other | Attending: Internal Medicine | Admitting: Dietician

## 2022-04-19 VITALS — Ht 71.0 in | Wt 251.0 lb

## 2022-04-19 DIAGNOSIS — E118 Type 2 diabetes mellitus with unspecified complications: Secondary | ICD-10-CM | POA: Diagnosis not present

## 2022-04-19 DIAGNOSIS — G4733 Obstructive sleep apnea (adult) (pediatric): Secondary | ICD-10-CM | POA: Diagnosis not present

## 2022-04-19 NOTE — Progress Notes (Signed)
Referral:  Type 2 Diabetes with stage 2 CKD  He is just recovering from a spider bite on his ankle (presumed brown recluse) and this is healing nicely per patient. He would like me to give him a sheet that discusses what to eat and what to avoid. He states that pasta and baked potatoes cause neuropathy pain.  History includes:  Type 2 diabetes (2016?), CKD, HOH (hearing aides), CVA, lung scarring, HLD, HTN, hypothyroid, OSA - uses a nose piece, neuropathy Labs:  BUN 38, Creatinine 1.73, Potassium 4.2 01/04/2022, GFR 37 9/4/2023A1C 7.1% 03/15/2022 and 7.2% 01/04/2022, vitamin D 30 01/04/2022 Medications include:  Farxiga, Starlix, Januvia, repatha, zetia, Vitamin G-12, Glucasamine, Turmeric curcumin, omega 3, levothyroxine, vitamin D  71" 251 lbs  Patient lives with his wife.  She does the shopping and cooking. He retired from [employer]. Sedentary, shoulder issues.  Rides a motorcycle.   Doesn't like a lot of vegetables although likes salads.  NKD national kidney diet - Dish up a Kidney-Friendly Meal for Patients with Chronic Kidney Disease (not on dialysis) AND kidney  Diabetes Self-Management Education  Visit Type: First/Initial  Appt. Start Time: 0905 Appt. End Time: 1030  04/19/2022  Mr. Allen Kell, identified by name and date of birth, is a 81 y.o. male with a diagnosis of Diabetes: Type 2.   ASSESSMENT  Height '5\' 11"'$  (1.803 m), weight 251 lb (113.9 kg). Body mass index is 35.01 kg/m.   Diabetes Self-Management Education - 04/19/22 0938       Visit Information   Visit Type First/Initial      Initial Visit   Diabetes Type Type 2    Date Diagnosed 2016    Are you currently following a meal plan? No    Are you taking your medications as prescribed? Yes   his wife gives him his meds and he is unable to verbalize     Health Coping   How would you rate your overall health? Good      Psychosocial Assessment   Patient Belief/Attitude about Diabetes  Motivated to manage diabetes    What is the hardest part about your diabetes right now, causing you the most concern, or is the most worrisome to you about your diabetes?   Making healty food and beverage choices;Being active    Self-care barriers None    Self-management support Doctor's office;Family    Other persons present Patient    Patient Concerns Nutrition/Meal planning    Preferred Learning Style No preference indicated    Learning Readiness Ready    How often do you need to have someone help you when you read instructions, pamphlets, or other written materials from your doctor or pharmacy? 1 - Never    What is the last grade level you completed in school? college      Pre-Education Assessment   Patient understands the diabetes disease and treatment process. Needs Instruction    Patient understands incorporating nutritional management into lifestyle. Needs Instruction    Patient undertands incorporating physical activity into lifestyle. Needs Instruction    Patient understands using medications safely. Needs Instruction    Patient understands monitoring blood glucose, interpreting and using results Needs Instruction    Patient understands prevention, detection, and treatment of acute complications. Needs Instruction    Patient understands prevention, detection, and treatment of chronic complications. Needs Instruction    Patient understands how to develop strategies to address psychosocial issues. Needs Instruction    Patient understands how to develop strategies  to promote health/change behavior. Needs Instruction      Complications   Last HgB A1C per patient/outside source 7.1 %   03/15/2022 and 7.2% 01/04/2022   How often do you check your blood sugar? 1-2 times/day    Fasting Blood glucose range (mg/dL) 130-179   130-145   Number of hypoglycemic episodes per month 0    Have you had a dilated eye exam in the past 12 months? Yes    Have you had a dental exam in the past 12 months?  No    Are you checking your feet? No    How many days per week are you checking your feet? --   wears diabetic shoes.  notices toe rub     Dietary Intake   Breakfast 4 pancakes cakes, sugar free syrup OR morningstar vegetarian sausage, eggs, cheese, toast with butter    Lunch Steak and cheese sub    Snack (afternoon) occasional chocolate chip cookies    Dinner rice, toast, 2 chicken thighs OR steak, rice OR salad with briscuit, boiled egg and honey mustard    Snack (evening) cheese (swiss and [place])    Beverage(s) sprite zero, water (8 oz daily), crystal lite, almond milk      Activity / Exercise   Activity / Exercise Type ADL's      Patient Education   Previous Diabetes Education No    Disease Pathophysiology Explored patient's options for treatment of their diabetes    Healthy Eating Other (comment);Meal options for control of blood glucose level and chronic complications.;Plate Method   renal   Medications Reviewed patients medication for diabetes, action, purpose, timing of dose and side effects.    Monitoring Identified appropriate SMBG and/or A1C goals.;Daily foot exams    Acute complications Taught prevention, symptoms, and  treatment of hypoglycemia - the 15 rule.    Chronic complications Identified and discussed with patient  current chronic complications    Diabetes Stress and Support Worked with patient to identify barriers to care and solutions      Individualized Goals (developed by patient)   Nutrition General guidelines for healthy choices and portions discussed    Physical Activity Exercise 3-5 times per week;15 minutes per day    Medications Not Applicable    Monitoring  Test my blood glucose as discussed    Problem Solving Eating Pattern    Reducing Risk treat hypoglycemia with 15 grams of carbs if blood glucose less than '70mg'$ /dL      Post-Education Assessment   Patient understands the diabetes disease and treatment process. Comprehends key points    Patient  understands incorporating nutritional management into lifestyle. Needs Review    Patient undertands incorporating physical activity into lifestyle. Comprehends key points    Patient understands using medications safely. Comphrehends key points    Patient understands monitoring blood glucose, interpreting and using results Comprehends key points    Patient understands prevention, detection, and treatment of acute complications. Comprehends key points    Patient understands prevention, detection, and treatment of chronic complications. Comprehends key points    Patient understands how to develop strategies to address psychosocial issues. Comprehends key points    Patient understands how to develop strategies to promote health/change behavior. Needs Review      Outcomes   Expected Outcomes Demonstrated interest in learning but significant barriers to change    Future DMSE PRN    Program Status Completed  Individualized Plan for Diabetes Self-Management Training:   Learning Objective:  Patient will have a greater understanding of diabetes self-management. Patient education plan is to attend individual and/or group sessions per assessed needs and concerns.   Plan:   Patient Instructions  Be sure to drink adequate water. Walk for 5 minutes after most meals and slowly increase to 10 minutes as tolerated.  This will help decrease your blood sugar after you eat. Avoid/limit red meat Baked rather than fried Limit salt and avoid adding it to food. Choose fresh ingredients or frozen or canned without added salt. Eat more vegetables (1 choice per meal), fresh fruit, whole grains Meatless meals at times.  The morningstar sausages are fine although higher in sodium. Eat small portions, eat slowly, stop when satisfied.   Expected Outcomes:  Demonstrated interest in learning but significant barriers to change  Education material provided: NKD national kidney diet - Dish up a  Kidney-Friendly Meal for Patients with Chronic Kidney Disease (not on dialysis) Kidney stages 3-5 Medical Nutrition Therapy from AND  If problems or questions, patient to contact team via:  Phone  Future DSME appointment: PRN

## 2022-04-19 NOTE — Patient Instructions (Addendum)
Be sure to drink adequate water. Walk for 5 minutes after most meals and slowly increase to 10 minutes as tolerated.  This will help decrease your blood sugar after you eat. Avoid/limit red meat Baked rather than fried Limit salt and avoid adding it to food. Choose fresh ingredients or frozen or canned without added salt. Eat more vegetables (1 choice per meal), fresh fruit, whole grains Meatless meals at times.  The morningstar sausages are fine although higher in sodium. Eat small portions, eat slowly, stop when satisfied.

## 2022-04-24 IMAGING — CT CT CHEST W/O CM
2 of 3 series · 15 of 36 positions shown, 18 images · non-contrast
Comparison: 05/17/2021

CLINICAL DATA: Pneumonia, abnormal chest x-ray, K9KW4-HC positive

EXAM:
CT CHEST WITHOUT CONTRAST
TECHNIQUE: Multidetector CT imaging of the chest was performed following the
standard protocol without IV contrast.

[Series 3: chest w/o 2mm st · axial · non-contrast · 0.95mm/px · z∈[+1215,+1511]mm · 12 of 174 slices shown, 15 images]
[im 13/174  mediastinal]
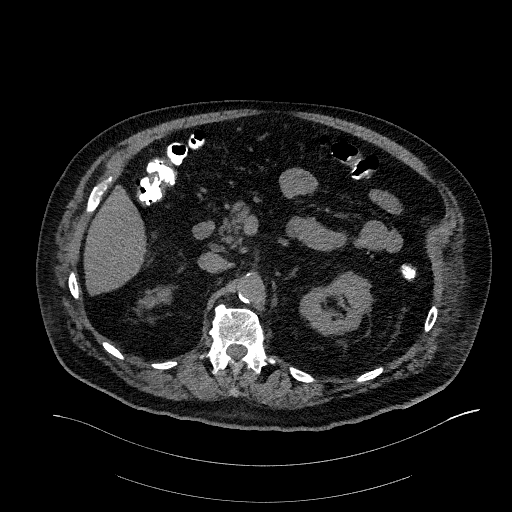
[im 13/174  lung]
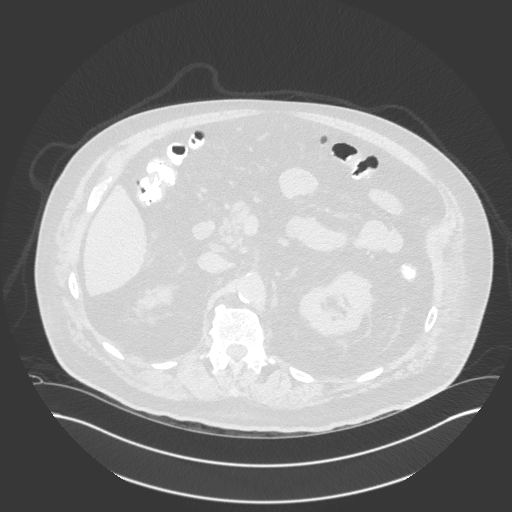
[im 26/174  lung]
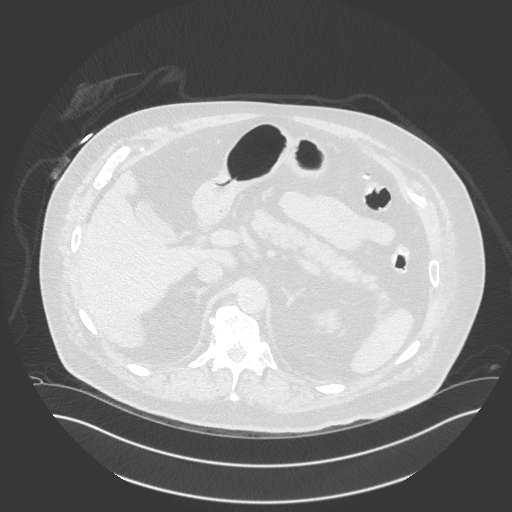
[im 39/174  lung]
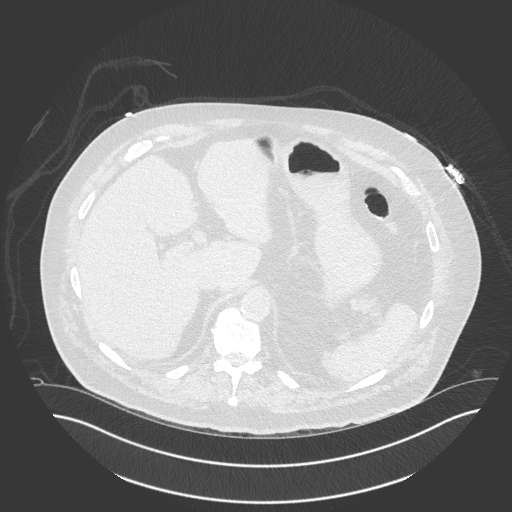
[im 52/174  lung]
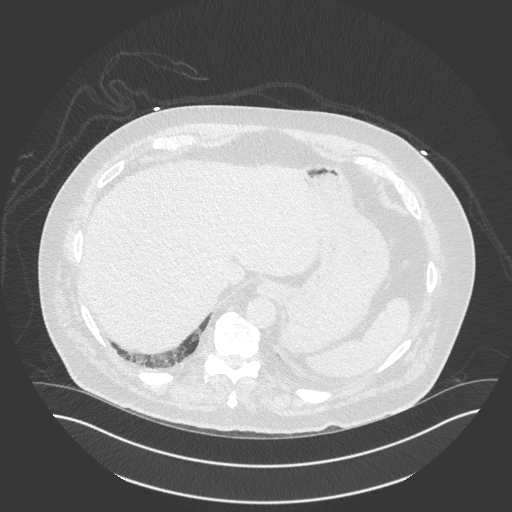
[im 65/174  mediastinal]
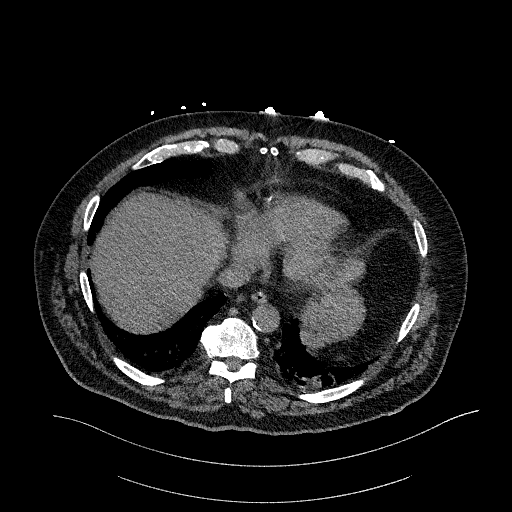
[im 65/174  lung]
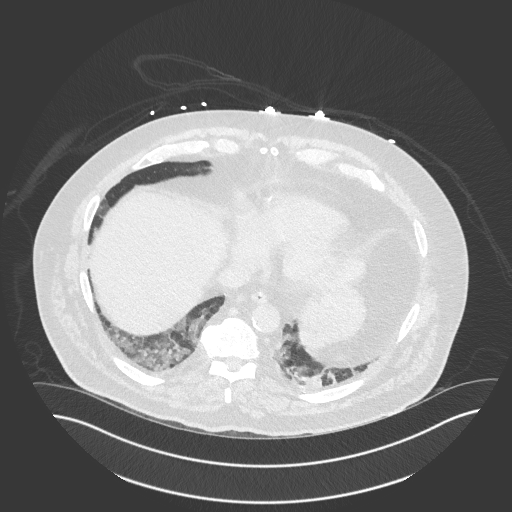
[im 77/174  lung]
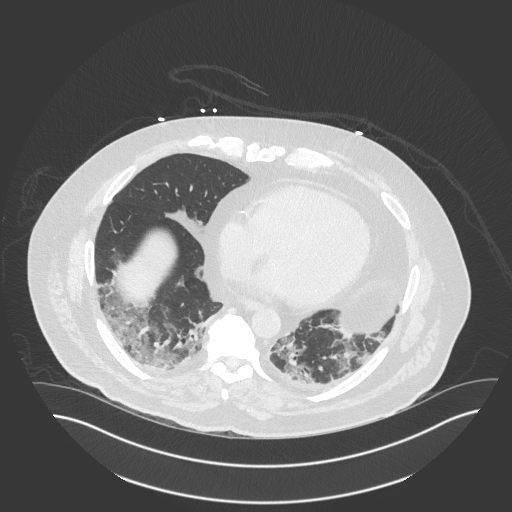
[im 97/174  lung]
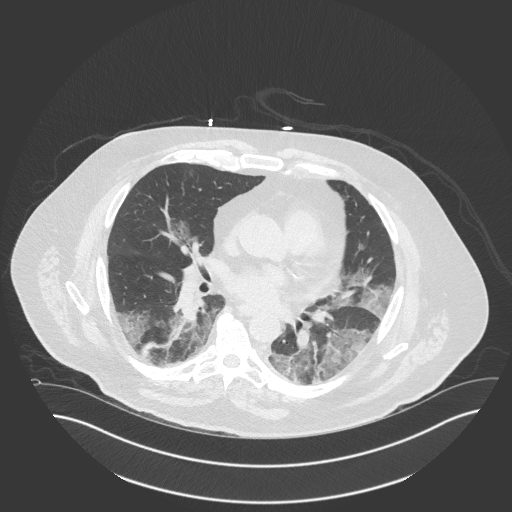
[im 109/174  lung]
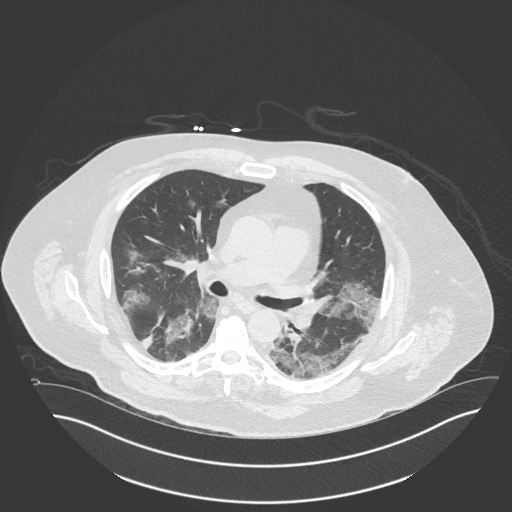
[im 122/174  mediastinal]
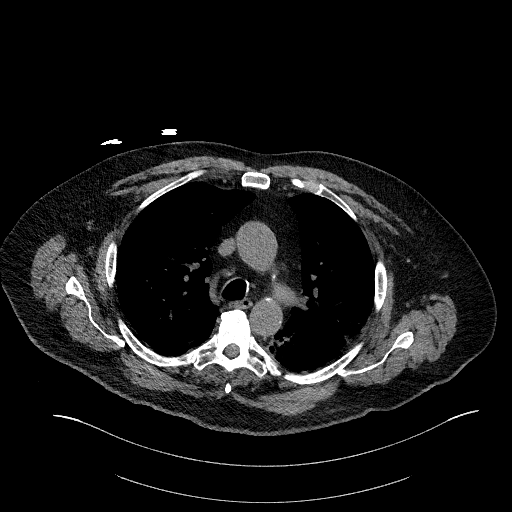
[im 122/174  lung]
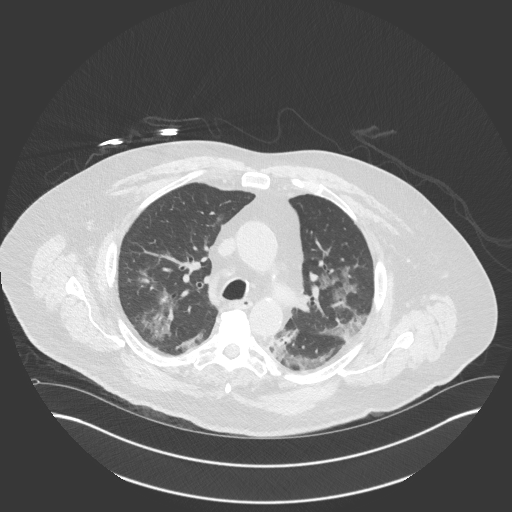
[im 135/174  lung]
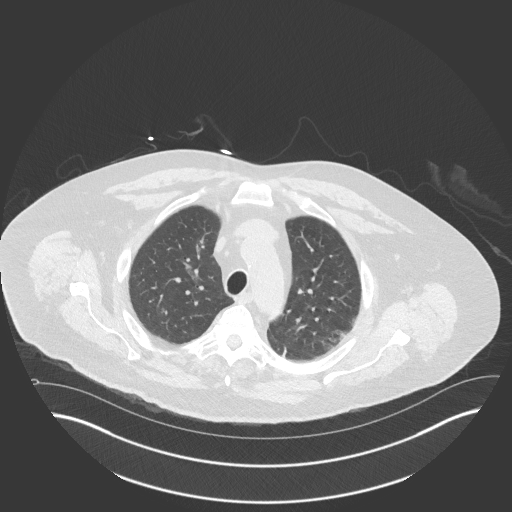
[im 148/174  lung]
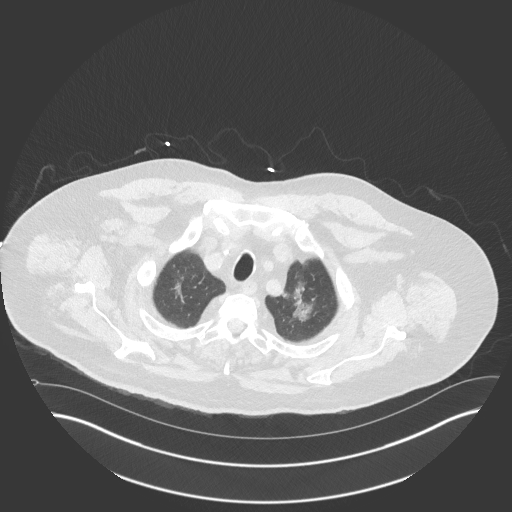
[im 161/174  lung]
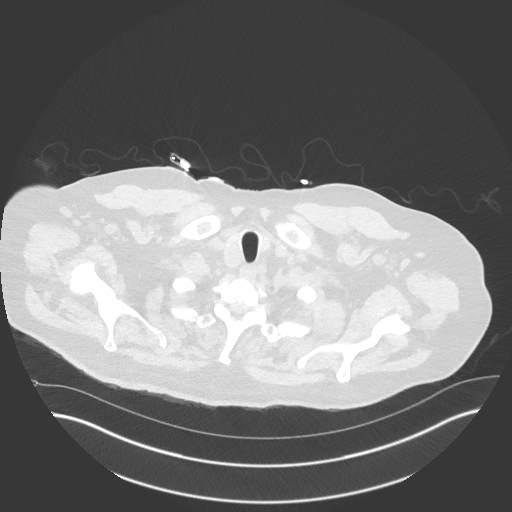

[Series 5: chest w/o 2mm st cor · coronal · non-contrast · 0.72mm/px · 3 of 163 slices shown]
[im 33/163  lung]
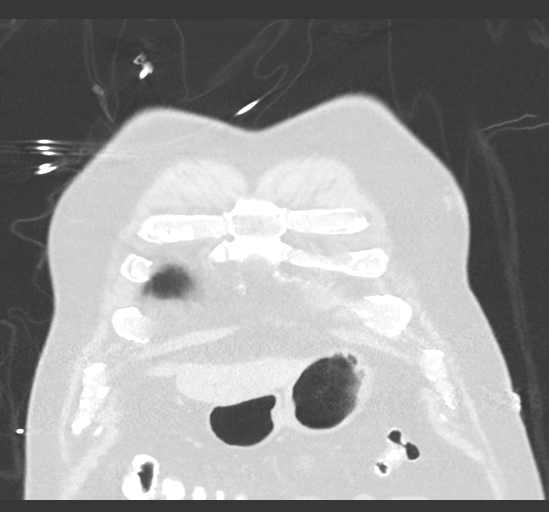
[im 65/163  lung]
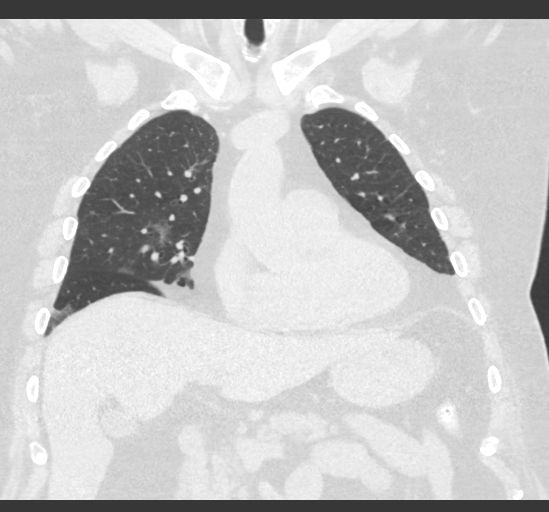
[im 98/163  lung]
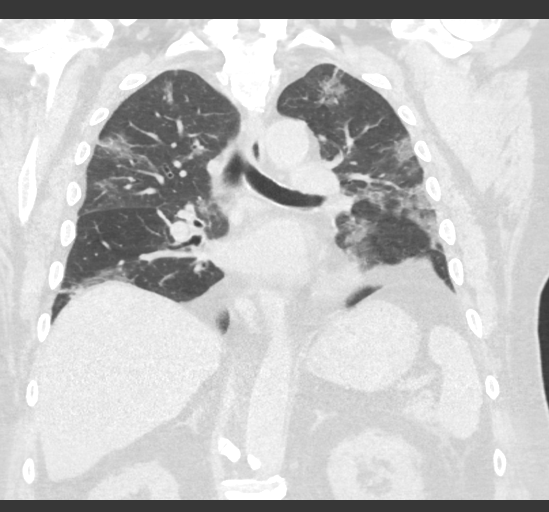

[15 of 36 positions shown; findings below may reference images not displayed]

FINDINGS: Cardiovascular: Unenhanced imaging of the heart and great vessels
demonstrates no pericardial effusion. Normal caliber of the thoracic
aorta. Evaluation of the vascular lumen is limited without IV
contrast. There is mild atherosclerosis of the aortic arch, with
moderate atherosclerosis of the coronary vasculature.

Mediastinum/Nodes: No enlarged mediastinal or axillary lymph nodes.
Thyroid gland, trachea, and esophagus demonstrate no significant
findings.

Lungs/Pleura: There are patchy bilateral ground-glass opacities,
greatest in the mid and lower lung zones, in a pattern consistent
with known history of K9KW4-HC. No effusion or pneumothorax. Focal
eventration of the left hemidiaphragm and prominent epicardial fat
pad likely account for the density seen at the left lateral lung
base on chest x-ray.

Upper Abdomen: Nodular contour of the liver capsule consistent with
cirrhosis. No acute upper abdominal finding.

Musculoskeletal: No acute or destructive bony lesions. Reconstructed
images demonstrate no additional findings.
IMPRESSION: 1. Multifocal bilateral ground-glass airspace disease consistent
with COVID 19 pneumonia.
2. Prominent epicardial fat pad and eventration of the left
hemidiaphragm likely account for the focal consolidation at the left
lateral lung base on chest x-ray.
3. Cirrhosis.
4. Aortic Atherosclerosis ([id number]). Coronary artery
atherosclerosis.

## 2022-04-24 IMAGING — DX DG CHEST 1V PORT SAME DAY
1 series · 1 of 1 positions shown · non-contrast
Comparison: May 13, 2021.

CLINICAL DATA: Shortness of breath, [id number] positive.

EXAM:
PORTABLE CHEST 1 VIEW

[chest]
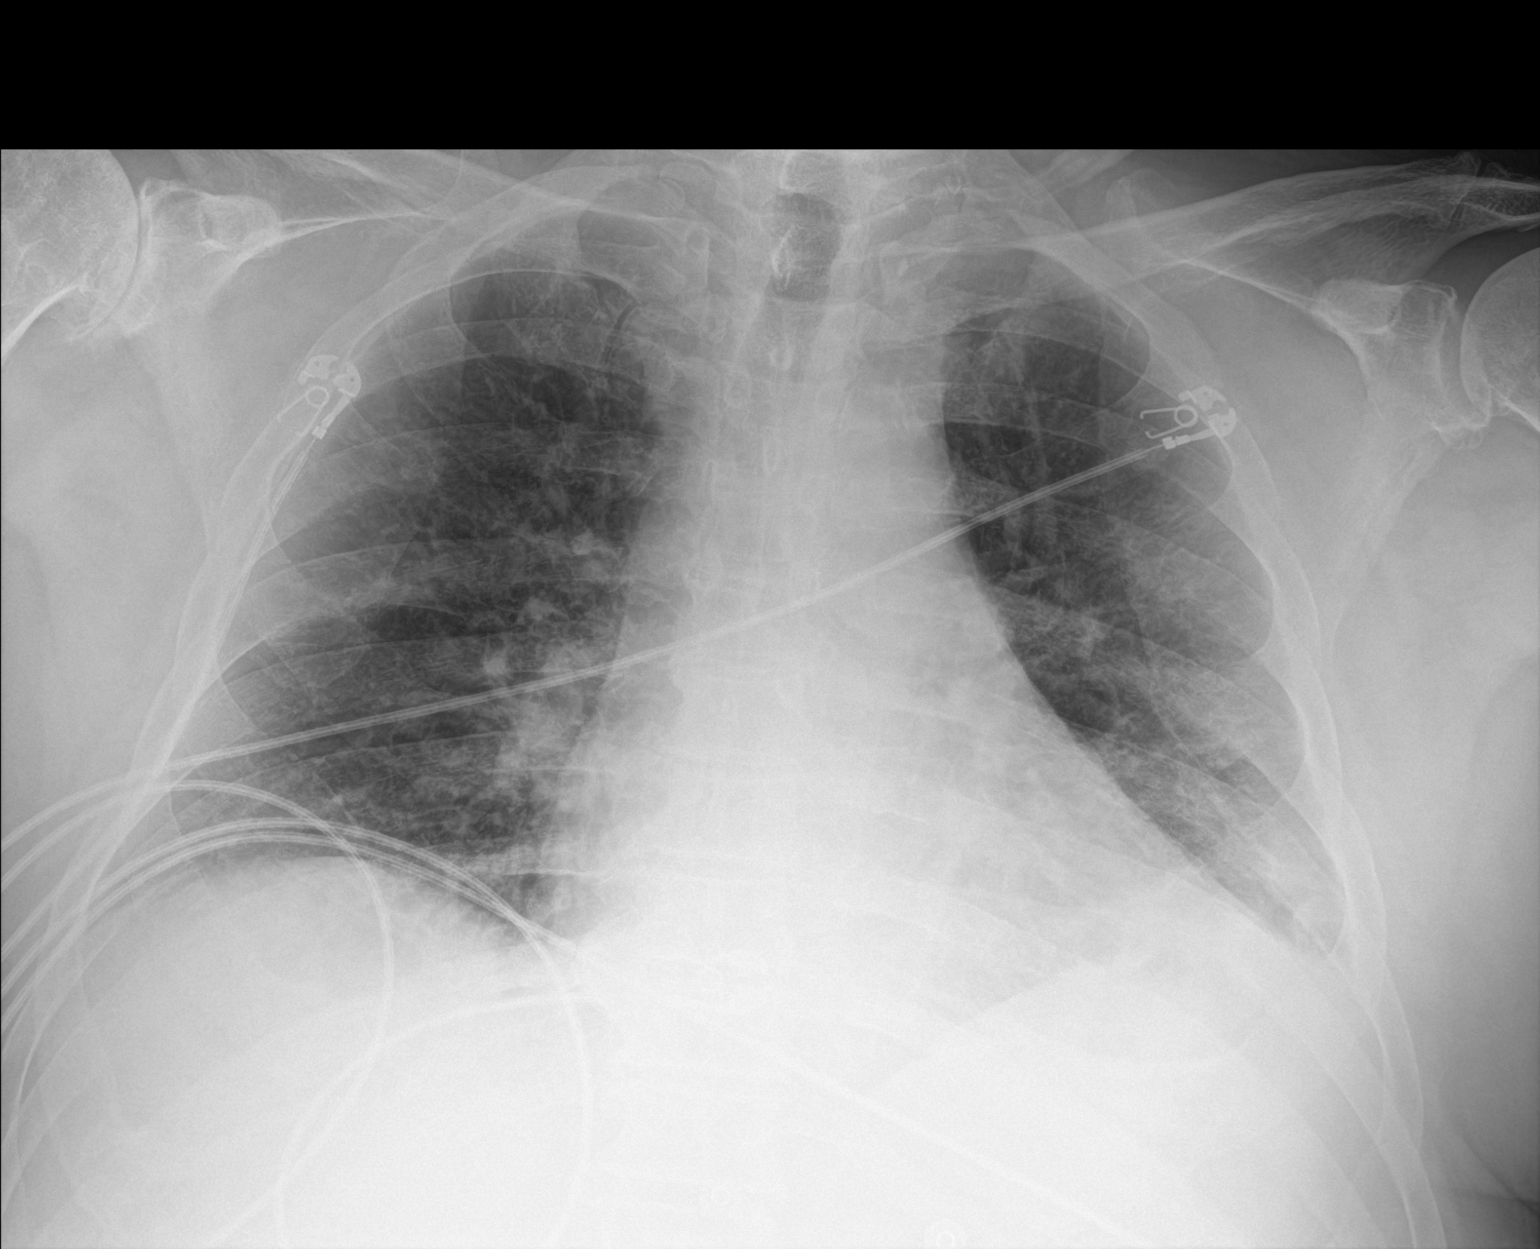

[1 of 1 positions shown; findings below may reference images not displayed]

FINDINGS: Stable cardiomediastinal silhouette. Right lung is clear. Stable
left basilar atelectasis or infiltrate is noted. No pneumothorax is
noted. Bony thorax is unremarkable.
IMPRESSION: Stable left basilar opacity as described above.

## 2022-05-09 ENCOUNTER — Other Ambulatory Visit: Payer: Self-pay | Admitting: Internal Medicine

## 2022-05-13 ENCOUNTER — Other Ambulatory Visit: Payer: Self-pay | Admitting: Internal Medicine

## 2022-05-14 ENCOUNTER — Other Ambulatory Visit: Payer: Self-pay | Admitting: Internal Medicine

## 2022-05-19 DIAGNOSIS — G4733 Obstructive sleep apnea (adult) (pediatric): Secondary | ICD-10-CM | POA: Diagnosis not present

## 2022-05-23 ENCOUNTER — Telehealth: Payer: Self-pay

## 2022-05-23 DIAGNOSIS — E1142 Type 2 diabetes mellitus with diabetic polyneuropathy: Secondary | ICD-10-CM

## 2022-05-23 NOTE — Telephone Encounter (Signed)
Inbound fax from pharmacy requesting a refill on Nateglinide 60 mg. Fax stated rx was previously prescribed by Dr Loanne Drilling. Last refill sent was from Dr Cathlean Cower.

## 2022-05-24 MED ORDER — NATEGLINIDE 60 MG PO TABS: ORAL_TABLET | ORAL | 0 refills | Status: DC

## 2022-05-24 NOTE — Telephone Encounter (Signed)
Rx sent to pharmacy   

## 2022-06-07 ENCOUNTER — Other Ambulatory Visit: Payer: Self-pay | Admitting: Internal Medicine

## 2022-06-07 NOTE — Telephone Encounter (Signed)
Please refill as per office routine med refill policy (all routine meds to be refilled for 3 mo or monthly (per pt preference) up to one year from last visit, then month to month grace period for 3 mo, then further med refills will have to be denied) ? ?

## 2022-06-09 IMAGING — DX DG CHEST 2V
2 series · 2 of 2 positions shown · non-contrast
Comparison: Previous studies including the examination of
05/17/2021

CLINICAL DATA: Chronic respiratory failure, pneumonia

EXAM:
CHEST - 2 VIEW

[chest pa]
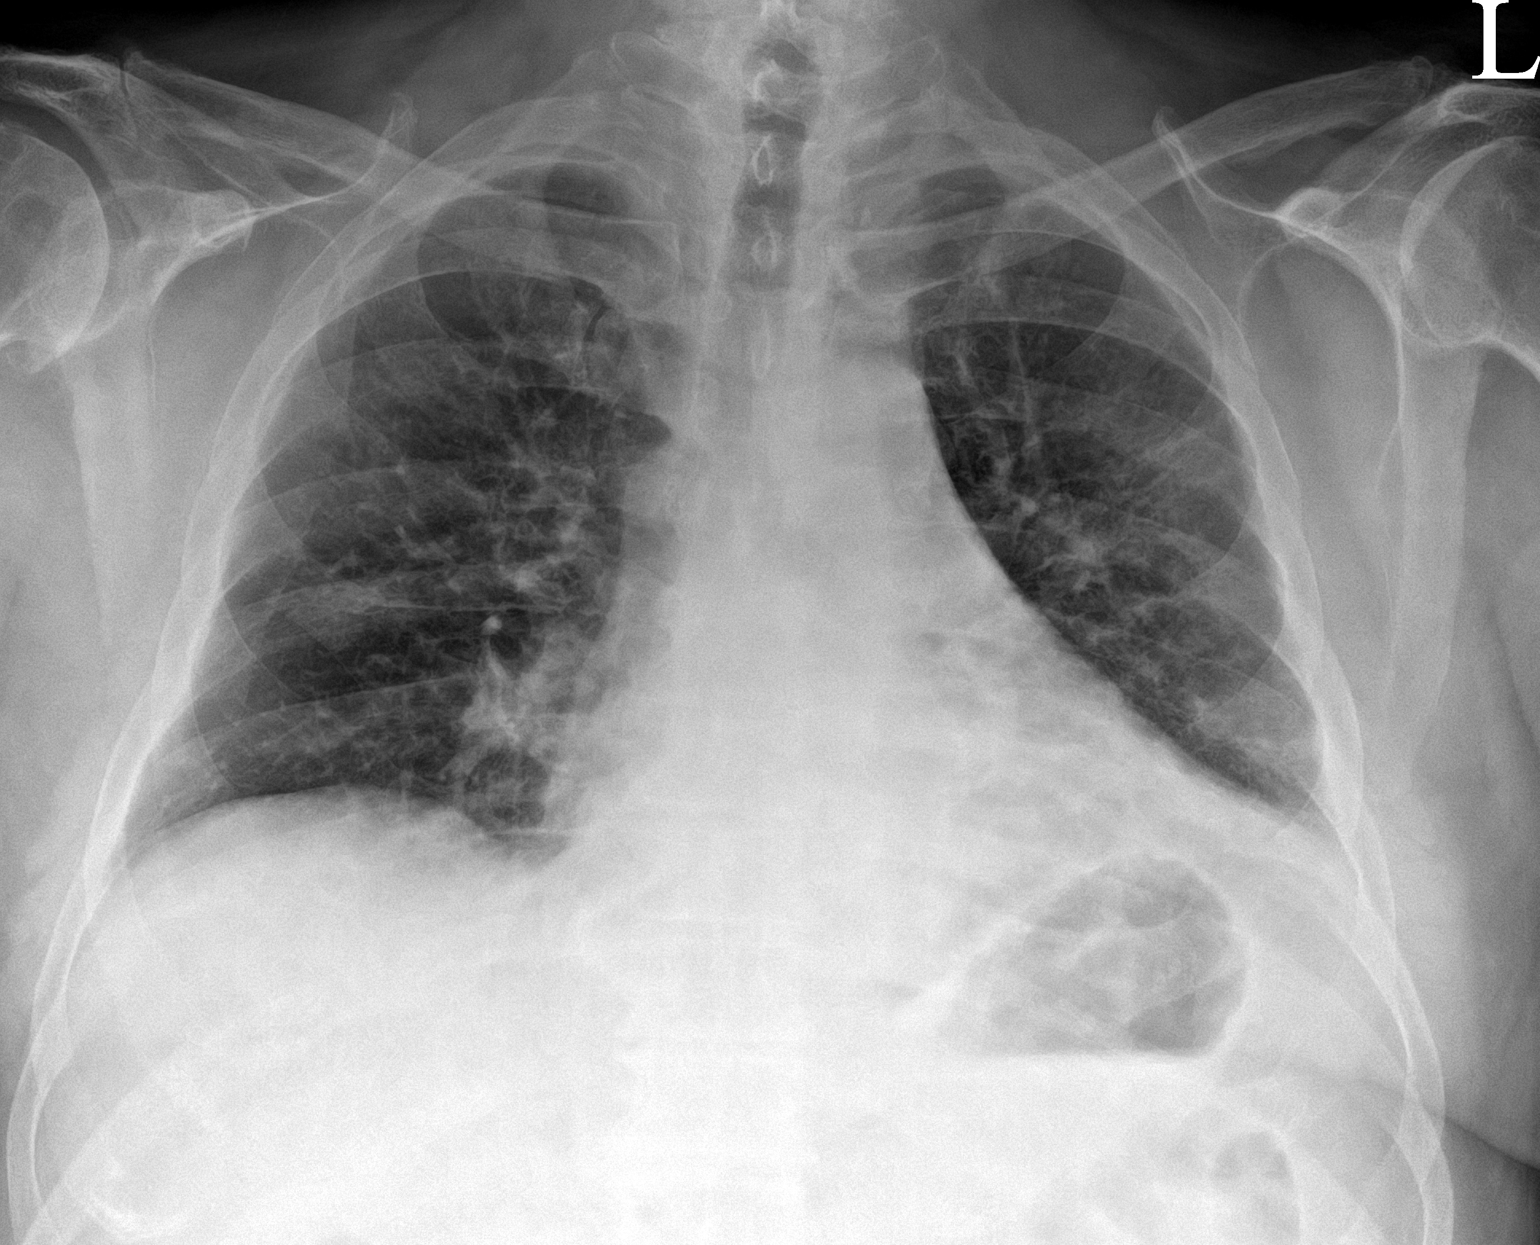

[chest lat]
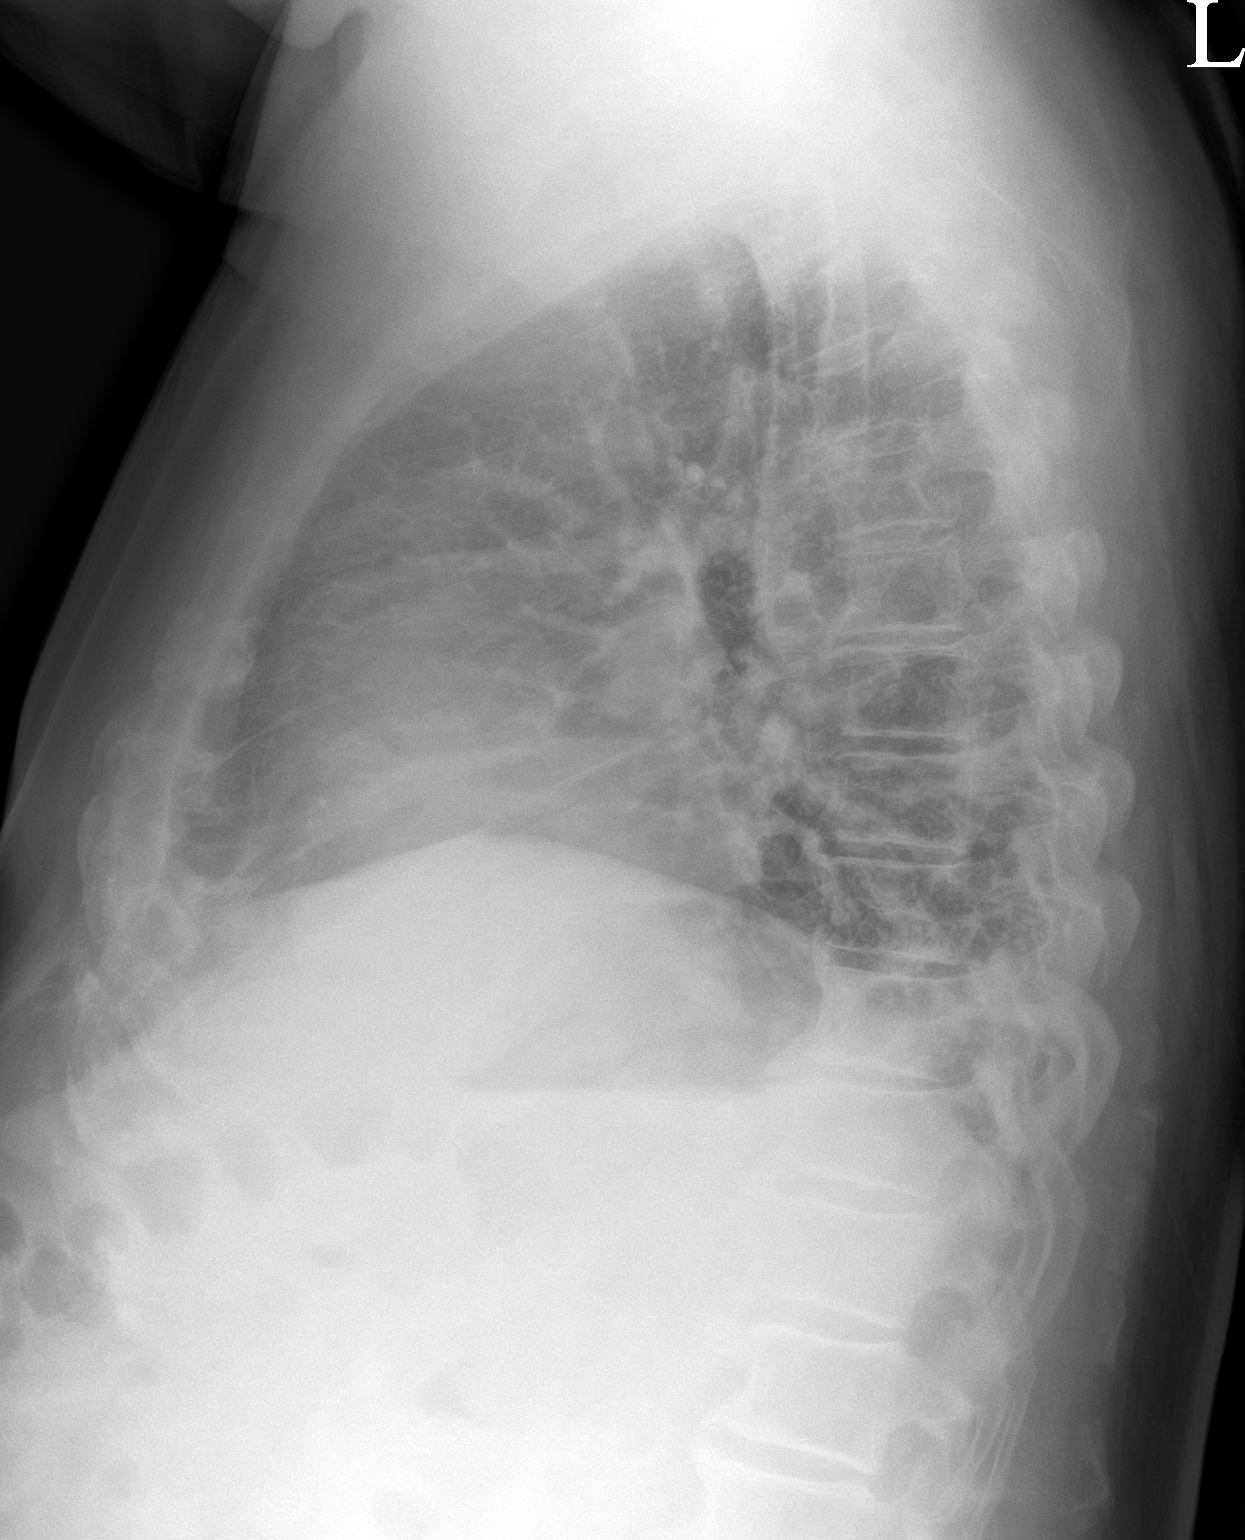

[2 of 2 positions shown; findings below may reference images not displayed]

FINDINGS: Transverse diameter of heart is slightly increased. There is poor
inspiration. Patchy infiltrate in the left lower lung fields has not
changed significantly. Rest of the lung fields are essentially
clear. There is no significant pleural effusion or pneumothorax.
IMPRESSION: Linear patchy densities in the left lower lung fields suggest
scarring or subsegmental atelectasis/pneumonia. No significant
interval changes are noted in the lung fields.

## 2022-06-19 DIAGNOSIS — G4733 Obstructive sleep apnea (adult) (pediatric): Secondary | ICD-10-CM | POA: Diagnosis not present

## 2022-07-05 ENCOUNTER — Other Ambulatory Visit: Payer: Self-pay | Admitting: Internal Medicine

## 2022-07-05 NOTE — Telephone Encounter (Signed)
Please refill as per office routine med refill policy (all routine meds to be refilled for 3 mo or monthly (per pt preference) up to one year from last visit, then month to month grace period for 3 mo, then further med refills will have to be denied) ? ?

## 2022-07-08 ENCOUNTER — Other Ambulatory Visit: Payer: Self-pay | Admitting: Internal Medicine

## 2022-07-08 NOTE — Telephone Encounter (Signed)
Please refill as per office routine med refill policy (all routine meds to be refilled for 3 mo or monthly (per pt preference) up to one year from last visit, then month to month grace period for 3 mo, then further med refills will have to be denied) ? ?

## 2022-07-20 DIAGNOSIS — G4733 Obstructive sleep apnea (adult) (pediatric): Secondary | ICD-10-CM | POA: Diagnosis not present

## 2022-08-12 ENCOUNTER — Other Ambulatory Visit: Payer: Self-pay | Admitting: Internal Medicine

## 2022-08-18 DIAGNOSIS — G4733 Obstructive sleep apnea (adult) (pediatric): Secondary | ICD-10-CM | POA: Diagnosis not present

## 2022-08-26 ENCOUNTER — Other Ambulatory Visit: Payer: Self-pay | Admitting: Internal Medicine

## 2022-08-26 DIAGNOSIS — E1142 Type 2 diabetes mellitus with diabetic polyneuropathy: Secondary | ICD-10-CM

## 2022-08-29 ENCOUNTER — Telehealth: Payer: Self-pay

## 2022-08-29 NOTE — Telephone Encounter (Signed)
Contacted [name] to schedule their annual wellness visit. Appointment made for 09/04/22.  Norton Blizzard, [place] ([facility])  [facility] [phone]

## 2022-09-04 ENCOUNTER — Ambulatory Visit: Payer: Medicare Other

## 2022-09-05 ENCOUNTER — Ambulatory Visit (INDEPENDENT_AMBULATORY_CARE_PROVIDER_SITE_OTHER): Payer: Medicare Other

## 2022-09-05 VITALS — Ht 70.5 in | Wt 251.0 lb

## 2022-09-05 DIAGNOSIS — Z0000 Encounter for general adult medical examination without abnormal findings: Secondary | ICD-10-CM

## 2022-09-05 NOTE — Progress Notes (Signed)
Subjective:   Joseph Villa is a 82 y.o. male who presents for Medicare Annual/ Initial preventive examination.  I connected with  [name] on 09/05/22 by a audio enabled telemedicine application and verified that I am speaking with the correct person using two identifiers.  Patient Location: Home  Provider Location: Home Office  I discussed the limitations of evaluation and management by telemedicine. The patient expressed understanding and agreed to proceed.   Review of Systems     Cardiac Risk Factors include: advanced age (>35men, >39 women);diabetes mellitus;dyslipidemia;hypertension;male gender;obesity (BMI >30kg/m2);sedentary lifestyle     Objective:    Today's Vitals   09/05/22 0908  Weight: 251 lb (113.9 kg)  Height: 5' 10.5" (1.791 m)   Body mass index is 35.51 kg/m.     09/05/2022    9:21 AM 04/19/2022    9:22 AM 03/08/2022    1:24 PM 05/12/2021   10:17 AM 11/21/2020   12:07 AM 11/16/2020   11:28 AM 11/08/2020    4:15 PM  Advanced Directives  Does Patient Have a Medical Advance Directive? Yes Yes Yes No Yes Yes No  Type of Paramedic of [place];Living will    Living will;Healthcare Power of Attorney Living will;Healthcare Power of Attorney   Does patient want to make changes to medical advance directive?   No - Patient declined  No - Patient declined No - Patient declined   Copy of [place] in Chart? No - copy requested    No - copy requested    Would patient like information on creating a medical advance directive?    No - Patient declined No - Patient declined  No - Patient declined    Current Medications (verified) Outpatient Encounter Medications as of 09/05/2022  Medication Sig   Accu-Chek FastClix Lancets MISC USE TWICE DAILY   ACCU-CHEK GUIDE test strip CHECK BLOOD GLUCOSE (SUGAR) TWICE DAILY   albuterol (VENTOLIN HFA) 108 (90 Base) MCG/ACT inhaler INHALE 2 PUFFS 4 TIMES DAILY AS NEEDED   amLODipine  (NORVASC) 10 MG tablet TAKE ONE TABLET EACH DAY   aspirin 81 MG chewable tablet Chew 1 tablet (81 mg total) by mouth daily.   bromocriptine (PARLODEL) 2.5 MG tablet Take 0.5 tablets (1.25 mg total) by mouth daily.   carbamazepine (TEGRETOL) 200 MG tablet TAKE 2 TABLETS AT BEDTIME   Cholecalciferol (VITAMIN D3) 2000 UNITS TABS Take 2,000 Units by mouth every morning.    clopidogrel (PLAVIX) 75 MG tablet TAKE ONE TABLET BY MOUTH EVERY DAY   dapagliflozin propanediol (FARXIGA) 10 MG TABS tablet Take 1 tablet (10 mg total) by mouth daily before breakfast.   desoximetasone (TOPICORT) 0.25 % cream Apply 1 application topically 2 (two) times daily. (Patient taking differently: Apply 1 application  topically 2 (two) times daily as needed (psoriasis).)   ezetimibe (ZETIA) 10 MG tablet TAKE ONE TABLET BY MOUTH EVERY DAY   fluticasone (FLONASE) 50 MCG/ACT nasal spray ONE SPRAY INTO NOSE EVERY DAY (Patient taking differently: Place 2 sprays into both nostrils daily as needed for rhinitis or allergies.)   Glucosamine HCl (GLUCOSAMINE PO) Take 1 tablet by mouth daily.   glucose blood (ACCU-CHEK AVIVA PLUS) test strip Used to check blood sugars twice a day Dx E11.9   glucose blood test strip Use to check blood sugar 2 times per day. DX Code: E11.9   JANUVIA 100 MG tablet TAKE ONE-HALF TABLET DAILY   ketoconazole (NIZORAL) 2 % cream Apply topically daily.  Lancets Misc. (ACCU-CHEK FASTCLIX LANCET) KIT AS DIRECTED   levothyroxine (SYNTHROID) 112 MCG tablet TAKE ONE TABLET EVERY DAY   losartan (COZAAR) 50 MG tablet Take 1 tablet (50 mg total) by mouth daily.   Multiple Vitamins-Minerals (PRESERVISION AREDS 2 PO) Take 1 tablet by mouth daily.   nateglinide (STARLIX) 60 MG tablet TAKE 1/2 TABLET THREE TIMES DAILY WITH meals   Omega-3 Fatty Acids (FISH OIL) 1200 MG CAPS Take 1 capsule by mouth daily.   pantoprazole (PROTONIX) 40 MG tablet TAKE ONE TABLET DAILY   PROCTO-MED HC 2.5 % rectal cream APPLY TWICE DAILY  RECTALLY   REPATHA SURECLICK [id number] MG/ML SOAJ INJECT 1 DOSE INTO THE SKIN EVERY 14 DAYS   sitaGLIPtin (JANUVIA) 50 MG tablet Take 1 tablet (50 mg total) by mouth daily.   tamsulosin (FLOMAX) 0.4 MG CAPS capsule TAKE ONE CAPSULE EACH DAY   TURMERIC CURCUMIN PO Take 500 mg by mouth daily.   vitamin B-12 (CYANOCOBALAMIN) 1000 MCG tablet Take 1,000 mcg by mouth daily.   [DISCONTINUED] doxycycline (VIBRA-TABS) 100 MG tablet Take 1 tablet (100 mg total) by mouth 2 (two) times daily.   No facility-administered encounter medications on file as of 09/05/2022.    Allergies (verified) Cefuroxime axetil, Cefprozil, Cephalosporins, Crestor [rosuvastatin calcium], Grass pollen(k-o-r-t-swt vern), Lipitor [atorvastatin], Metformin and related, and Rabeprazole sodium   History: Past Medical History:  Diagnosis Date   ALLERGIC ASTHMA 11/25/2007   ALLERGIC RHINITIS 11/25/2007   Allergy    Arthritis, lumbar spine 04/18/2011   BIPOLAR DISORDER UNSPECIFIED 10/06/2009   Blood transfusion without reported diagnosis    s/p goiter surgery age 5    COLONIC POLYPS, HX OF 10/06/2009   Degenerative arthritis of hip 04/18/2011   DJD (degenerative joint disease)    DM w/o Complication Type II [id number]   GERD 10/06/2009   Hemorrhage yrs ago   after goiter surgery, tracheostomy inserted and later removed   Hemorrhoid    History of kidney stones    HYPERLIPIDEMIA 10/06/2009   HYPERTENSION, BENIGN 07/27/2009   HYPOTHYROIDISM 10/06/2009   NEPHROLITHIASIS, HX OF 10/06/2009   OBSTRUCTIVE SLEEP APNEA 11/25/2007   cpap setting of 9   Peptic stricture of esophagus    RBBB (right bundle branch block)    Sleep apnea    wears c pap   Stroke    Past Surgical History:  Procedure Laterality Date   COLONOSCOPY     GANGLION CYST EXCISION     2-3 cysts removed   HERNIA REPAIR     JOINT REPLACEMENT     thyroid Goiter surgery     TONSILLECTOMY     TONSILLECTOMY     TOTAL HIP ARTHROPLASTY  06/16/2012   Procedure:  TOTAL HIP ARTHROPLASTY ANTERIOR APPROACH;  Surgeon: Mauri Pole, MD;  Location: [facility];  Service: Orthopedics;  Laterality: Left;   TRANSCAROTID ARTERY REVASCULARIZATION  Left 11/20/2020   Procedure: LEFT TRANSCAROTID ARTERY REVASCULARIZATION;  Surgeon: Marty Heck, MD;  Location: [facility] OR;  Service: Vascular;  Laterality: Left;   UMBILICAL HERNIA REPAIR  several yrs ago   UPPER GASTROINTESTINAL ENDOSCOPY     Family History  Problem Relation Age of Onset   Throat cancer Mother    Alcohol abuse Brother    Diabetes Brother    Drug abuse Daughter    Alcoholism Other        uncle   Breast cancer Father    Colon cancer Neg Hx    Colon polyps Neg Hx  Esophageal cancer Neg Hx    Rectal cancer Neg Hx    Stomach cancer Neg Hx    Social History   Socioeconomic History   Marital status: Married    Spouse name: Arbie Cookey   Number of children: 3   Years of education: Not on file   Highest education level: Not on file  Occupational History   Occupation: retired  Tobacco Use   Smoking status: Former    Packs/day: 1.50    Years: 15.00    Additional pack years: 0.00    Total pack years: 22.50    Types: Cigarettes, Pipe    Quit date: 06/23/1977    Years since quitting: 45.2   Smokeless tobacco: Never  Vaping Use   Vaping Use: Never used  Substance and Sexual Activity   Alcohol use: No    Alcohol/week: 0.0 standard drinks of alcohol   Drug use: No   Sexual activity: Not Currently  Other Topics Concern   Not on file  Social History Narrative   POA: Prosper Prinkey, wife   Social Determinants of Health   Financial Resource Strain: Low Risk  (09/05/2022)   Overall Financial Resource Strain (CARDIA)    Difficulty of Paying Living Expenses: Not hard at all  Food Insecurity: No Food Insecurity (09/05/2022)   Hunger Vital Sign    Worried About Running Out of Food in the Last Year: Never true    [place] in the Last Year: Never true  Transportation Needs: No Transportation  Needs (09/05/2022)   PRAPARE - Hydrologist (Medical): No    Lack of Transportation (Non-Medical): No  Physical Activity: Insufficiently Active (09/05/2022)   Exercise Vital Sign    Days of Exercise per Week: 7 days    Minutes of Exercise per Session: 20 min  Stress: No Stress Concern Present (09/05/2022)   [place]    Feeling of Stress : Not at all  Social Connections: Moderately Isolated (09/05/2022)   Social Connection and Isolation Panel [NHANES]    Frequency of Communication with Friends and Family: Patient unable to answer    Frequency of Social Gatherings with Friends and Family: Patient unable to answer    Attends Religious Services: Never    Marine scientist or Organizations: No    Attends Music therapist: Never    Marital Status: Married    Tobacco Counseling Counseling given: Not Answered   Clinical Intake:  Pre-visit preparation completed: Yes  Pain : No/denies pain     BMI - recorded: 35.51 Nutritional Status: BMI > 30  Obese Nutritional Risks: None Diabetes: No  How often do you need to have someone help you when you read instructions, pamphlets, or other written materials from your doctor or pharmacy?: 1 - Never  Diabetic?Nutrition Risk Assessment:  Has the patient had any N/V/D within the last 2 months?  No  Does the patient have any non-healing wounds?  No  Has the patient had any unintentional weight loss or weight gain?  No   Diabetes:  Is the patient diabetic?  Yes  If diabetic, was a CBG obtained today?  No  Did the patient bring in their glucometer from home?  No  How often do you monitor your CBG's? BID - 153 this am per patient. Usually 130s   Financial Strains and Diabetes Management:  Are you having any financial strains with the device, your supplies or  your medication? No .  Does the patient want to be seen by Chronic Care  Management for management of their diabetes?  No  Would the patient like to be referred to a Nutritionist or for Diabetic Management?  No   Diabetic Exams:  Diabetic Eye Exam: Patient had eye exam on last month, results pending. Diabetic Foot Exam: Overdue, Pt has been advised about the importance in completing this exam. Pt is scheduled for diabetic foot exam on 09/20/2022.   Interpreter Needed?: No  Information entered by :: Allante Whitmire, LPN   Activities of Daily Living    09/05/2022    9:21 AM  In your present state of health, do you have any difficulty performing the following activities:  Hearing? 1  Comment wears hearing aids - has routine f/u with audiology  Vision? 0  Difficulty concentrating or making decisions? 0  Walking or climbing stairs? 0  Dressing or bathing? 0  Doing errands, shopping? 0  Preparing Food and eating ? N  Using the Toilet? N  In the past six months, have you accidently leaked urine? N  Do you have problems with loss of bowel control? N  Managing your Medications? N  Managing your Finances? N  Housekeeping or managing your Housekeeping? N    Patient Care Team: Biagio Borg, MD as PCP - General Renato Shin as Consulting Physician (Endocrinology) Luberta Mutter, MD as Consulting Physician (Ophthalmology) Jarome Matin, MD as Consulting Physician (Dermatology) Deneise Lever, MD as Consulting Physician (Pulmonary Disease) Debara Pickett Nadean Corwin, MD as Consulting Physician (Cardiology)  Indicate any recent Medical Services you may have received from other than [facility] providers in the past year (date may be approximate).     Assessment:   This is a routine wellness examination for [place].  Hearing/Vision screen Hearing Screening - Comments:: Wears hearing aids - up to date with annual f/u with audiologist across from [facility]. Vision Screening - Comments:: Wears reading glasses - up to date with routine eye exams with [facility].  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 Get more active, get shoulder better       Depression Screen    09/05/2022    9:19 AM 04/19/2022    9:21 AM 03/12/2022    3:53 PM 01/04/2022    1:06 PM 07/02/2021    2:20 PM 06/11/2021    2:45 PM 11/28/2020    2:59 PM  PHQ 2/9 Scores  PHQ - 2 Score 0 0 0 0 0 0 0  PHQ- 9 Score    0       Fall Risk    09/05/2022    9:14 AM 04/19/2022    9:20 AM 03/12/2022    3:53 PM 01/04/2022    1:06 PM 07/02/2021    2:20 PM  [place] in the past year? 0 0 0 0 0  Number falls in past yr: 0  0 0 0  Injury with Fall? 0  0 0 0  Risk for fall due to : Orthopedic patient  No Fall Risks    Follow  up Falls prevention discussed;Education provided  Falls evaluation completed      FALL RISK PREVENTION PERTAINING TO THE HOME:  Any stairs in or around the home? Yes  If so, are there any without handrails? No  Home free of loose throw rugs in walkways, pet beds, electrical cords, etc? Yes  Adequate lighting in your home to reduce risk of falls? Yes   ASSISTIVE DEVICES UTILIZED TO PREVENT FALLS:  Life alert? No  Use of a cane, walker or w/c? No  Grab bars in the bathroom? No  Shower chair or bench in shower? No  Elevated toilet seat or a handicapped toilet? Yes   TIMED UP AND GO:  Was the test performed? No . televisit Cognitive Function:        09/05/2022    9:24 AM  6CIT Screen  What Year? 0 points  What month? 0 points  What time? 0 points  Count back from 20 0 points  Months in reverse 0 points  Repeat phrase 0 points  Total Score 0 points    Immunizations Immunization History   Administered Date(s) Administered   Fluad Quad(high Dose 65+) 02/19/2019, 04/15/2020, 03/09/2021, 03/12/2022   H1N1 05/09/2008   Influenza Split 03/21/2011, 03/13/2012   Influenza Whole 03/03/2009, 03/08/2010   Influenza, High Dose Seasonal PF 03/17/2015, 03/12/2016, 03/19/2017, 03/02/2018   Influenza,inj,Quad PF,6+ Mos 04/05/2013, 02/15/2014   PFIZER(Purple Top)SARS-COV-2 Vaccination 09/02/2019, 10/02/2019, 05/18/2020   Pneumococcal Conjugate-13 10/13/2013   Pneumococcal Polysaccharide-23 06/04/2007, 10/31/2017   Td 06/04/2007   Tdap 03/21/2011    TDAP status: Due, Education has been provided regarding the importance of this vaccine. Advised may receive this vaccine at local pharmacy or Health Dept. Aware to provide a copy of the vaccination record if obtained from local pharmacy or Health Dept. Verbalized acceptance and understanding.  Flu Vaccine status: Up to date  Pneumococcal vaccine status: Up to date  Covid-19 vaccine status: Declined, Education has been provided regarding the importance of this vaccine but patient still declined. Advised may receive this vaccine at local pharmacy or Health Dept.or vaccine clinic. Aware to provide a copy of the vaccination record if obtained from local pharmacy or Health Dept. Verbalized acceptance and understanding.  Qualifies for Shingles Vaccine? Yes   Zostavax completed Yes   Shingrix Completed?: No.    Education has been provided regarding the importance of this vaccine. Patient has been advised to call insurance company to determine out of pocket expense if they have not yet received this vaccine. Advised may also receive vaccine at local pharmacy or Health Dept. Verbalized acceptance and understanding.  Screening Tests Health Maintenance  Topic Date Due   Zoster Vaccines- Shingrix (1 of 2) Never done   COLONOSCOPY (Pts 45-64yrs Insurance coverage will need to be confirmed)  12/19/2020   DTaP/Tdap/Td (3 - Td or Tdap) 03/20/2021    OPHTHALMOLOGY EXAM  09/29/2021   Diabetic kidney evaluation - Urine ACR  10/23/2021   FOOT EXAM  03/19/2022   HEMOGLOBIN A1C  09/14/2022   INFLUENZA VACCINE  01/02/2023   Diabetic kidney evaluation - eGFR measurement  01/05/2023   Medicare Annual Wellness (AWV)  09/05/2023   Pneumonia Vaccine 31+ Years old  Completed   Hepatitis C Screening  Completed   HPV VACCINES  Aged Out   COVID-19 Vaccine  Discontinued    Health Maintenance  Health Maintenance Due  Topic Date Due   Zoster Vaccines- Shingrix (1 of 2) Never done   COLONOSCOPY (Pts 45-26yrs Insurance coverage will need  to be confirmed)  12/19/2020   DTaP/Tdap/Td (3 - Td or Tdap) 03/20/2021   OPHTHALMOLOGY EXAM  09/29/2021   Diabetic kidney evaluation - Urine ACR  10/23/2021   FOOT EXAM  03/19/2022    Colorectal cancer screening: Referral to GI placed at last visit per patient. Pt aware the office will call re: appt. He wants to discuss with Dr Jenny Reichmann again - not sure he wants to repeat.  Lung Cancer Screening: (Low Dose CT Chest recommended if Age 47-80 years, 30 pack-year currently smoking OR have quit w/in 15years.) does not qualify.  Additional Screening:  Hepatitis C Screening: does qualify; Completed 05/22/2020  Vision Screening: Recommended annual ophthalmology exams for early detection of glaucoma and other disorders of the eye. Is the patient up to date with their annual eye exam?  Yes  Who is the provider or what is the name of the office in which the patient attends annual eye exams? [facility] If pt is not established with a provider, would they like to be referred to a provider to establish care? No .   Dental Screening: Recommended annual dental exams for proper oral hygiene  Community Resource Referral / Chronic Care Management: CRR required this visit?  No   CCM required this visit?  No      Plan:     I have personally reviewed and noted the following in the patient's chart:   Medical and social  history Use of alcohol, tobacco or illicit drugs  Current medications and supplements including opioid prescriptions. Patient is not currently taking opioid prescriptions. Functional ability and status Nutritional status Physical activity Advanced directives List of other physicians Hospitalizations, surgeries, and ER visits in previous 12 months Vitals Screenings to include cognitive, depression, and falls Referrals and appointments  In addition, I have reviewed and discussed with patient certain preventive protocols, quality metrics, and best practice recommendations. A written personalized care plan for preventive services as well as general preventive health recommendations were provided to patient.   Due to this being a telephonic visit, the after visit summary with patients personalized plan was offered to patient via mail or my-chart.  Patient would like to access on my-chart.   Sandrea Hammond, LPN   [id number]   Nurse Notes: Wants to get Shingrix and Tdap at next visit. Unsure if he wants another colonoscopy - will discuss with PCP. Need foot exam.

## 2022-09-05 NOTE — Patient Instructions (Signed)
[name] , Thank you for taking time to come for your Medicare Wellness Visit. I appreciate your ongoing commitment to your health goals. Please review the following plan we discussed and let me know if I can assist you in the future.   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 Get more active, get shoulder better        This is a list of the screening recommended for you and due dates:  Health Maintenance  Topic Date Due   Zoster (Shingles) Vaccine (1 of 2) Never done   Colon Cancer Screening  12/19/2020   DTaP/Tdap/Td vaccine (3 - Td or Tdap) 03/20/2021   Eye exam for diabetics  09/29/2021   Yearly kidney health urinalysis for diabetes  10/23/2021   Complete foot exam   03/19/2022   Hemoglobin A1C  09/14/2022   Flu Shot  01/02/2023   Yearly kidney function blood test for diabetes  01/05/2023   Medicare Annual Wellness Visit  09/05/2023   Pneumonia Vaccine  Completed   Hepatitis C Screening: USPSTF Recommendation to screen - Ages 18-79 yo.  Completed   HPV Vaccine  Aged Out   COVID-19 Vaccine  Discontinued    Advanced directives: Please bring a copy of your health care power of attorney and living will to the office to be added to your chart at your convenience.   Conditions/risks identified: Aim for 30 minutes of exercise or brisk walking, 6-8 glasses of water, and 5 servings of fruits and vegetables each day.   Next appointment: Follow up in one year for your annual wellness visit.   Preventive Care 75 Years and Older, Male  Preventive care refers to lifestyle choices and visits with your health care provider that can promote health and wellness. What does preventive care include? A yearly physical exam. This is also called an annual well check. Dental exams once or twice a year. Routine eye exams. Ask your health care provider how often you should have your eyes  checked. Personal lifestyle choices, including: Daily care of your teeth and gums. Regular physical activity. Eating a healthy diet. Avoiding tobacco and drug use. Limiting alcohol use. Practicing safe sex. Taking low doses of aspirin every day. Taking vitamin and mineral supplements as recommended by your health care provider. What happens during an annual well check? The services and screenings done by your health care provider during your annual well check will depend on your age, overall health, lifestyle risk factors, and family history of disease. Counseling  Your health care provider may ask you questions about your: Alcohol use. Tobacco use. Drug use. Emotional well-being. Home and relationship well-being. Sexual activity. Eating habits. History of falls. Memory and ability to understand (cognition). Work and work Statistician. Screening  You may have the following tests or measurements: Height, weight, and BMI. Blood pressure. Lipid and cholesterol levels. These may be checked every 5 years, or more frequently if you are over 67 years old. Skin check. Lung cancer screening. You may have this screening every year starting at age 4 if you have a 30-pack-year history of smoking and currently smoke or have quit within the past 15 years. Fecal occult blood test (FOBT) of the stool. You may have this test every year starting at age 35. Flexible sigmoidoscopy or colonoscopy. You may have a sigmoidoscopy every 5 years or a colonoscopy  every 10 years starting at age 32. Prostate cancer screening. Recommendations will vary depending on your family history and other risks. Hepatitis C blood test. Hepatitis B blood test. Sexually transmitted disease (STD) testing. Diabetes screening. This is done by checking your blood sugar (glucose) after you have not eaten for a while (fasting). You may have this done every 1-3 years. Abdominal aortic aneurysm (AAA) screening. You may need this  if you are a current or former smoker. Osteoporosis. You may be screened starting at age 66 if you are at high risk. Talk with your health care provider about your test results, treatment options, and if necessary, the need for more tests. Vaccines  Your health care provider may recommend certain vaccines, such as: Influenza vaccine. This is recommended every year. Tetanus, diphtheria, and acellular pertussis (Tdap, Td) vaccine. You may need a Td booster every 10 years. Zoster vaccine. You may need this after age 17. Pneumococcal 13-valent conjugate (PCV13) vaccine. One dose is recommended after age 57. Pneumococcal polysaccharide (PPSV23) vaccine. One dose is recommended after age 22. Talk to your health care provider about which screenings and vaccines you need and how often you need them. This information is not intended to replace advice given to you by your health care provider. Make sure you discuss any questions you have with your health care provider. Document Released: 06/16/2015 Document Revised: 02/07/2016 Document Reviewed: 03/21/2015 Elsevier Interactive Patient Education  2017 [place] Prevention in the Home Falls can cause injuries. They can happen to people of all ages. There are many things you can do to make your home safe and to help prevent falls. What can I do on the outside of my home? Regularly fix the edges of walkways and driveways and fix any cracks. Remove anything that might make you trip as you walk through a door, such as a raised step or threshold. Trim any bushes or trees on the path to your home. Use bright outdoor lighting. Clear any walking paths of anything that might make someone trip, such as rocks or tools. Regularly check to see if handrails are loose or broken. Make sure that both sides of any steps have handrails. Any raised decks and porches should have guardrails on the edges. Have any leaves, snow, or ice cleared regularly. Use sand  or salt on walking paths during winter. Clean up any spills in your garage right away. This includes oil or grease spills. What can I do in the bathroom? Use night lights. Install grab bars by the toilet and in the tub and shower. Do not use towel bars as grab bars. Use non-skid mats or decals in the tub or shower. If you need to sit down in the shower, use a plastic, non-slip stool. Keep the floor dry. Clean up any water that spills on the floor as soon as it happens. Remove soap buildup in the tub or shower regularly. Attach bath mats securely with double-sided non-slip rug tape. Do not have throw rugs and other things on the floor that can make you trip. What can I do in the bedroom? Use night lights. Make sure that you have a light by your bed that is easy to reach. Do not use any sheets or blankets that are too big for your bed. They should not hang down onto the floor. Have a firm chair that has side arms. You can use this for support while you get dressed. Do not have throw rugs and other things on  the floor that can make you trip. What can I do in the kitchen? Clean up any spills right away. Avoid walking on wet floors. Keep items that you use a lot in easy-to-reach places. If you need to reach something above you, use a strong step stool that has a grab bar. Keep electrical cords out of the way. Do not use floor polish or wax that makes floors slippery. If you must use wax, use non-skid floor wax. Do not have throw rugs and other things on the floor that can make you trip. What can I do with my stairs? Do not leave any items on the stairs. Make sure that there are handrails on both sides of the stairs and use them. Fix handrails that are broken or loose. Make sure that handrails are as long as the stairways. Check any carpeting to make sure that it is firmly attached to the stairs. Fix any carpet that is loose or worn. Avoid having throw rugs at the top or bottom of the stairs.  If you do have throw rugs, attach them to the floor with carpet tape. Make sure that you have a light switch at the top of the stairs and the bottom of the stairs. If you do not have them, ask someone to add them for you. What else can I do to help prevent falls? Wear shoes that: Do not have high heels. Have rubber bottoms. Are comfortable and fit you well. Are closed at the toe. Do not wear sandals. If you use a stepladder: Make sure that it is fully opened. Do not climb a closed stepladder. Make sure that both sides of the stepladder are locked into place. Ask someone to hold it for you, if possible. Clearly mark and make sure that you can see: Any grab bars or handrails. First and last steps. Where the edge of each step is. Use tools that help you move around (mobility aids) if they are needed. These include: Canes. Walkers. Scooters. Crutches. Turn on the lights when you go into a dark area. Replace any light bulbs as soon as they burn out. Set up your furniture so you have a clear path. Avoid moving your furniture around. If any of your floors are uneven, fix them. If there are any pets around you, be aware of where they are. Review your medicines with your doctor. Some medicines can make you feel dizzy. This can increase your chance of falling. Ask your doctor what other things that you can do to help prevent falls. This information is not intended to replace advice given to you by your health care provider. Make sure you discuss any questions you have with your health care provider. Document Released: 03/16/2009 Document Revised: 10/26/2015 Document Reviewed: 06/24/2014 Elsevier Interactive Patient Education  2017 [employer].

## 2022-09-18 DIAGNOSIS — G4733 Obstructive sleep apnea (adult) (pediatric): Secondary | ICD-10-CM | POA: Diagnosis not present

## 2022-09-19 NOTE — Progress Notes (Signed)
Name: Joseph Villa  Age/ Sex: 82 y.o., male   MRN/ DOB: [id number], August 18, 1940     PCP: Corwin Levins, MD   Reason for Endocrinology Evaluation: Type 2 Diabetes Mellitus  Initial Endocrine Consultative Visit: 02/14/2015    PATIENT IDENTIFIER: [name] is a 82 y.o. male with a past medical history of Dm, dyslipidemia . The patient has followed with Endocrinology clinic since 02/14/2015 for consultative assistance with management of his diabetes.  DIABETIC HISTORY:  [name] was diagnosed with DM 2007. His hemoglobin A1c has ranged from 5.7% in 2018, peaking at 7.9% in 2019.  He was followed by Dr. Everardo All from 2016 until March 2023   SUBJECTIVE:   During the last visit (03/15/2022): A1c 7.1%  Today (09/20/2022): [name] is here for follow-up on diabetes management.  He checks his blood sugars 1 times daily. The patient has not had hypoglycemic episodes since the last clinic visit  Denies nausea or vomiting, has occasional loose stools that he attributes to eating certain foods    HOME DIABETES REGIMEN:  Januvia 100, half a tablet daily Farxiga 10 mg daily Bromocriptine 2.5 mg, half a tablet daily  Starlix 60 mg , half a tablet before each meal    Statin: Patient on Repatha ACE-I/ARB: Yes    METER DOWNLOAD SUMMARY: unable to download 136-152 mg/dL     DIABETIC COMPLICATIONS: Microvascular complications:  CKD III, neuropathy Denies:  Last Eye Exam: Completed 06/2022  Macrovascular complications:   Denies: CAD, CVA, PVD   HISTORY:  Past Medical History:  Past Medical History:  Diagnosis Date   ALLERGIC ASTHMA 11/25/2007   ALLERGIC RHINITIS 11/25/2007   Allergy    Arthritis, lumbar spine 04/18/2011   BIPOLAR DISORDER UNSPECIFIED 10/06/2009   Blood transfusion without reported diagnosis    s/p goiter surgery age 62    COLONIC POLYPS, HX OF 10/06/2009   Degenerative arthritis of hip 04/18/2011   DJD (degenerative joint disease)    DM w/o  Complication Type II 05/10/2008   GERD 10/06/2009   Hemorrhage yrs ago   after goiter surgery, tracheostomy inserted and later removed   Hemorrhoid    History of kidney stones    HYPERLIPIDEMIA 10/06/2009   HYPERTENSION, BENIGN 07/27/2009   HYPOTHYROIDISM 10/06/2009   NEPHROLITHIASIS, HX OF 10/06/2009   OBSTRUCTIVE SLEEP APNEA 11/25/2007   cpap setting of 9   Peptic stricture of esophagus    RBBB (right bundle branch block)    Sleep apnea    wears c pap   Stroke    Past Surgical History:  Past Surgical History:  Procedure Laterality Date   COLONOSCOPY     GANGLION CYST EXCISION     2-3 cysts removed   HERNIA REPAIR     JOINT REPLACEMENT     thyroid Goiter surgery     TONSILLECTOMY     TONSILLECTOMY     TOTAL HIP ARTHROPLASTY  06/16/2012   Procedure: TOTAL HIP ARTHROPLASTY ANTERIOR APPROACH;  Surgeon: Shelda Pal, MD;  Location: [facility];  Service: Orthopedics;  Laterality: Left;   TRANSCAROTID ARTERY REVASCULARIZATION  Left 11/20/2020   Procedure: LEFT TRANSCAROTID ARTERY REVASCULARIZATION;  Surgeon: Cephus Shelling, MD;  Location: [facility] OR;  Service: Vascular;  Laterality: Left;   UMBILICAL HERNIA REPAIR  several yrs ago   UPPER GASTROINTESTINAL ENDOSCOPY     Social History:  reports that he quit smoking about 45 years ago. His smoking use included cigarettes and pipe. He has a 22.50  pack-year smoking history. He has never used smokeless tobacco. He reports that he does not drink alcohol and does not use drugs. Family History:  Family History  Problem Relation Age of Onset   Throat cancer Mother    Alcohol abuse Brother    Diabetes Brother    Drug abuse Daughter    Alcoholism Other        uncle   Breast cancer Father    Colon cancer Neg Hx    Colon polyps Neg Hx    Esophageal cancer Neg Hx    Rectal cancer Neg Hx    Stomach cancer Neg Hx      HOME MEDICATIONS: Allergies as of 09/20/2022       Reactions   Cefuroxime Axetil Anaphylaxis   REACTION:  anaphylaxis-ceftin   Cefprozil    REACTION: unknown   Cephalosporins Nausea Only   Crestor [rosuvastatin Calcium]    Muscle aches    Grass Pollen([id number]-o-[id number]-t-swt Vern)    Lipitor [atorvastatin]    myalgiaas   Metformin And Related Diarrhea   Rabeprazole Sodium Nausea Only   REACTION: nausea        Medication List        Accurate as of September 20, 2022  8:40 AM. If you have any questions, ask your nurse or doctor.          Accu-Chek FastClix Lancet Kit AS DIRECTED   Accu-Chek FastClix Lancets Misc USE TWICE DAILY   albuterol 108 (90 Base) MCG/ACT inhaler Commonly known as: VENTOLIN HFA INHALE 2 PUFFS 4 TIMES DAILY AS NEEDED   amLODipine 10 MG tablet Commonly known as: NORVASC TAKE ONE TABLET EACH DAY   aspirin 81 MG chewable tablet Chew 1 tablet (81 mg total) by mouth daily.   bromocriptine 2.5 MG tablet Commonly known as: PARLODEL Take 0.5 tablets (1.25 mg total) by mouth daily.   carbamazepine 200 MG tablet Commonly known as: TEGRETOL TAKE 2 TABLETS AT BEDTIME   clopidogrel 75 MG tablet Commonly known as: PLAVIX TAKE ONE TABLET BY MOUTH EVERY DAY   cyanocobalamin 1000 MCG tablet Commonly known as: VITAMIN B12 Take 1,000 mcg by mouth daily.   dapagliflozin propanediol 10 MG Tabs tablet Commonly known as: Farxiga Take 1 tablet (10 mg total) by mouth daily before breakfast.   desoximetasone 0.25 % cream Commonly known as: TOPICORT Apply 1 application topically 2 (two) times daily. What changed:  when to take this reasons to take this   ezetimibe 10 MG tablet Commonly known as: ZETIA TAKE ONE TABLET BY MOUTH EVERY DAY   Fish Oil 1200 MG Caps Take 1 capsule by mouth daily.   fluticasone 50 MCG/ACT nasal spray Commonly known as: FLONASE ONE SPRAY INTO NOSE EVERY DAY What changed: See the new instructions.   GLUCOSAMINE PO Take 1 tablet by mouth daily.   glucose blood test strip Use to check blood sugar 2 times per day. DX Code: E11.9    glucose blood test strip Commonly known as: Accu-Chek Aviva Plus Used to check blood sugars twice a day Dx E11.9   Accu-Chek Guide test strip Generic drug: glucose blood CHECK BLOOD GLUCOSE (SUGAR) TWICE DAILY   ketoconazole 2 % cream Commonly known as: NIZORAL Apply topically daily.   levothyroxine 112 MCG tablet Commonly known as: SYNTHROID TAKE ONE TABLET EVERY DAY   losartan 50 MG tablet Commonly known as: COZAAR Take 1 tablet (50 mg total) by mouth daily.   nateglinide 60 MG tablet Commonly known as: STARLIX TAKE 1/2  TABLET THREE TIMES DAILY WITH meals   pantoprazole 40 MG tablet Commonly known as: PROTONIX TAKE ONE TABLET DAILY   PRESERVISION AREDS 2 PO Take 1 tablet by mouth daily.   Procto-Med HC 2.5 % rectal cream Generic drug: hydrocortisone APPLY TWICE DAILY RECTALLY   Repatha SureClick 140 MG/ML Soaj Generic drug: Evolocumab INJECT 1 DOSE INTO THE SKIN EVERY 14 DAYS   sitaGLIPtin 50 MG tablet Commonly known as: Januvia Take 1 tablet (50 mg total) by mouth daily.   Januvia 100 MG tablet Generic drug: sitaGLIPtin TAKE ONE-HALF TABLET DAILY   tamsulosin 0.4 MG Caps capsule Commonly known as: FLOMAX TAKE ONE CAPSULE EACH DAY   TURMERIC CURCUMIN PO Take 500 mg by mouth daily.   Vitamin D3 50 MCG (2000 UT) Tabs Take 2,000 Units by mouth every morning.         OBJECTIVE:   Vital Signs: BP 128/72 (BP Location: Left Arm, Patient Position: Sitting, Cuff Size: Large)   Pulse 92   Ht 5' 10.5" (1.791 m)   Wt 247 lb (112 kg)   SpO2 94%   BMI 34.94 kg/m   Wt Readings from Last 3 Encounters:  09/20/22 247 lb (112 kg)  09/05/22 251 lb (113.9 kg)  04/19/22 251 lb (113.9 kg)     Exam: General: Pt appears well and is in NAD  Lungs: Clear with good BS bilat   Heart: RRR   Extremities: No pretibial edema.   Neuro: MS is good with appropriate affect, pt is alert and Ox3    DM foot exam: 03/15/2022  The skin of the feet is intact without  sores or ulcerations. The pedal pulses are 1+ on right and 1+ on left. The sensation is decreased  to a screening 5.07, 10 gram monofilament on the left      DATA REVIEWED:  Lab Results  Component Value Date   HGBA1C 6.9 (A) 09/20/2022   HGBA1C 7.1 (A) 03/15/2022   HGBA1C 7.2 (H) 01/04/2022   Lab Results  Component Value Date   MICROALBUR 13.3 (H) 10/23/2020   LDLCALC 118 (H) 11/21/2020   CREATININE 1.73 (H) 01/04/2022   Lab Results  Component Value Date   MICRALBCREAT 7.0 10/23/2020     Lab Results  Component Value Date   CHOL 119 01/04/2022   HDL 46.30 01/04/2022   LDLCALC 118 (H) 11/21/2020   LDLDIRECT 38.0 01/04/2022   TRIG (H) 01/04/2022    436.0 Triglyceride is over 400; calculations on Lipids are invalid.   CHOLHDL 3 01/04/2022         ASSESSMENT / PLAN / RECOMMENDATIONS:   1) Type 2 Diabetes Mellitus, Optimally controlled, With neuropathic and CKD III complications - Most recent A1c of 6.9 %. Goal A1c <7.5%.    -A1c remains at goal without hypoglycemia - Limited glycemic agents due to CKD III - No changes at this time   MEDICATIONS: Continue Januvia 50 mg daily  Continue Farxiga 10 mg daily  Continue Bromocriptine 2.5 mg, half a tablet daily  Continue Starlix 60 mg, half a tablet 3 times daily before every meal  EDUCATION / INSTRUCTIONS: BG monitoring instructions: Patient is instructed to check his blood sugars 1 times a day, fasting . Call [facility] if: BG persistently < 70  I reviewed the Rule of 15 for the treatment of hypoglycemia in detail with the patient. Literature supplied.    2) Diabetic complications:  Eye: Does not have known diabetic retinopathy.  Neuro/ Feet: Does  have known diabetic peripheral neuropathy .  Renal: Patient does  have known baseline CKD. He   is  on an ACEI/ARB at present.      F/U in 6 months     Signed electronically by: Lyndle Herrlich, MD  [facility]  [facility] [address] [place] [place], [place] [address] Phone: [phone] FAX: [phone]   CC: Corwin Levins, MD [address] [place] [address] Phone: [phone]  Fax: [phone]  Return to Endocrinology clinic as below: Future Appointments  Date Time Provider Department Center  09/08/2023  9:45 AM [facility]-ANNUAL WELLNESS VISIT [facility] None

## 2022-09-20 ENCOUNTER — Ambulatory Visit: Payer: Medicare Other | Admitting: Internal Medicine

## 2022-09-20 ENCOUNTER — Encounter: Payer: Self-pay | Admitting: Internal Medicine

## 2022-09-20 VITALS — BP 128/72 | HR 92 | Ht 70.5 in | Wt 247.0 lb

## 2022-09-20 DIAGNOSIS — E1142 Type 2 diabetes mellitus with diabetic polyneuropathy: Secondary | ICD-10-CM

## 2022-09-20 DIAGNOSIS — E1122 Type 2 diabetes mellitus with diabetic chronic kidney disease: Secondary | ICD-10-CM

## 2022-09-20 DIAGNOSIS — N1832 Chronic kidney disease, stage 3b: Secondary | ICD-10-CM | POA: Diagnosis not present

## 2022-09-20 LAB — POCT GLYCOSYLATED HEMOGLOBIN (HGB A1C): Hemoglobin A1C: 6.9 % — AB (ref 4.0–5.6)

## 2022-09-20 MED ORDER — BROMOCRIPTINE MESYLATE 2.5 MG PO TABS: 1.2500 mg | ORAL_TABLET | Freq: Every day | ORAL | 3 refills | Status: DC

## 2022-09-20 MED ORDER — DAPAGLIFLOZIN PROPANEDIOL 10 MG PO TABS
10.0000 mg | ORAL_TABLET | Freq: Every day | ORAL | 3 refills | Status: DC
Start: 2022-09-20 — End: 2023-02-14

## 2022-09-20 MED ORDER — SITAGLIPTIN PHOSPHATE 50 MG PO TABS
50.0000 mg | ORAL_TABLET | Freq: Every day | ORAL | 3 refills | Status: DC
Start: 2022-09-20 — End: 2023-03-28

## 2022-09-20 MED ORDER — NATEGLINIDE 60 MG PO TABS: ORAL_TABLET | ORAL | 3 refills | Status: DC

## 2022-09-20 NOTE — Patient Instructions (Addendum)
Please take sitaGLIPtin (JANUVIA) 50 MG, 1  tablet daily  Continue Farxiga 10 mg daily Continue Bromocriptine 2.5 mg, half a tablet daily  Continue Starlix 60 mg , half a tablet before each meal     HOW TO TREAT LOW BLOOD SUGARS (Blood sugar LESS THAN 70 MG/DL) Please follow the RULE OF 15 for the treatment of hypoglycemia treatment (when your (blood sugars are less than 70 mg/dL)   STEP 1: Take 15 grams of carbohydrates when your blood sugar is low, which includes:  3-4 GLUCOSE TABS  OR 3-4 OZ OF JUICE OR REGULAR SODA OR ONE TUBE OF GLUCOSE GEL    STEP 2: RECHECK blood sugar in 15 MINUTES STEP 3: If your blood sugar is still low at the 15 minute recheck --> then, go back to STEP 1 and treat AGAIN with another 15 grams of carbohydrates.

## 2022-10-08 DIAGNOSIS — E119 Type 2 diabetes mellitus without complications: Secondary | ICD-10-CM | POA: Diagnosis not present

## 2022-10-08 DIAGNOSIS — H353132 Nonexudative age-related macular degeneration, bilateral, intermediate dry stage: Secondary | ICD-10-CM | POA: Diagnosis not present

## 2022-10-08 DIAGNOSIS — H2513 Age-related nuclear cataract, bilateral: Secondary | ICD-10-CM | POA: Diagnosis not present

## 2022-10-08 DIAGNOSIS — H52203 Unspecified astigmatism, bilateral: Secondary | ICD-10-CM | POA: Diagnosis not present

## 2022-10-08 LAB — HM DIABETES EYE EXAM

## 2022-10-18 DIAGNOSIS — G4733 Obstructive sleep apnea (adult) (pediatric): Secondary | ICD-10-CM | POA: Diagnosis not present

## 2022-10-22 ENCOUNTER — Other Ambulatory Visit: Payer: Self-pay | Admitting: *Deleted

## 2022-10-22 DIAGNOSIS — I6523 Occlusion and stenosis of bilateral carotid arteries: Secondary | ICD-10-CM

## 2022-11-01 DIAGNOSIS — G4733 Obstructive sleep apnea (adult) (pediatric): Secondary | ICD-10-CM | POA: Diagnosis not present

## 2022-11-04 ENCOUNTER — Other Ambulatory Visit: Payer: Self-pay | Admitting: Internal Medicine

## 2022-11-05 ENCOUNTER — Ambulatory Visit (HOSPITAL_COMMUNITY)
Admission: RE | Admit: 2022-11-05 | Discharge: 2022-11-05 | Disposition: A | Discharge status: Home / Self Care | Payer: Medicare Other | Source: Ambulatory Visit | Attending: Vascular Surgery | Admitting: Vascular Surgery

## 2022-11-05 ENCOUNTER — Ambulatory Visit: Payer: Medicare Other | Admitting: Physician Assistant

## 2022-11-05 VITALS — BP 155/74 | HR 65 | Temp 97.7°F | Wt 251.0 lb

## 2022-11-05 DIAGNOSIS — I6523 Occlusion and stenosis of bilateral carotid arteries: Secondary | ICD-10-CM

## 2022-11-05 NOTE — Progress Notes (Signed)
Office Note   History of Present Illness   Joseph Villa is a 82 y.o. (12-08-1940) male who presents for surveillance of carotid artery stenosis.  He has a history of left TCAR on 11/20/2020 by Dr. Chestine Spore.  This was done for symptomatic left ICA stenosis with history of amaurosis fugax.  The patient returns today for follow up. He denies any recent CVA or TIA diagnosis. He also denies any recent strokelike symptoms such as slurred speech, facial droop, sudden visual changes, or sudden weakness/numbness.  His cholesterol is well-managed on Repatha injections.  He does not take statins due to severe myalgias.  He takes aspirin and Plavix daily.  Current Outpatient Medications  Medication Sig Dispense Refill   Accu-Chek FastClix Lancets MISC USE TWICE DAILY 102 each 2   ACCU-CHEK GUIDE test strip CHECK BLOOD GLUCOSE (SUGAR) TWICE DAILY 200 strip 3   albuterol (VENTOLIN HFA) 108 (90 Base) MCG/ACT inhaler INHALE 2 PUFFS 4 TIMES DAILY AS NEEDED 8.5 g 0   amLODipine (NORVASC) 10 MG tablet TAKE ONE TABLET EACH DAY 90 tablet 3   aspirin 81 MG chewable tablet Chew 1 tablet (81 mg total) by mouth daily. 90 tablet 3   bromocriptine (PARLODEL) 2.5 MG tablet Take 0.5 tablets (1.25 mg total) by mouth daily. 45 tablet 3   carbamazepine (TEGRETOL) 200 MG tablet Take 2 tablets (400 mg total) by mouth at bedtime. Annual appt due in August must see provider for future refills 180 tablet 0   Cholecalciferol (VITAMIN D3) 2000 UNITS TABS Take 2,000 Units by mouth every morning.      clopidogrel (PLAVIX) 75 MG tablet TAKE ONE TABLET BY MOUTH EVERY DAY 30 tablet 6   dapagliflozin propanediol (FARXIGA) 10 MG TABS tablet Take 1 tablet (10 mg total) by mouth daily before breakfast. 90 tablet 3   desoximetasone (TOPICORT) 0.25 % cream Apply 1 application topically 2 (two) times daily. (Patient taking differently: Apply 1 application  topically 2 (two) times daily as needed (psoriasis).) 100 g 0   ezetimibe (ZETIA)  10 MG tablet TAKE ONE TABLET BY MOUTH EVERY DAY 30 tablet 6   fluticasone (FLONASE) 50 MCG/ACT nasal spray ONE SPRAY INTO NOSE EVERY DAY (Patient taking differently: Place 2 sprays into both nostrils daily as needed for rhinitis or allergies.) 48 g 1   Glucosamine HCl (GLUCOSAMINE PO) Take 1 tablet by mouth daily.     glucose blood (ACCU-CHEK AVIVA PLUS) test strip Used to check blood sugars twice a day Dx E11.9 200 each 0   glucose blood test strip Use to check blood sugar 2 times per day. DX Code: E11.9 200 each 2   ketoconazole (NIZORAL) 2 % cream Apply topically daily.     Lancets Misc. (ACCU-CHEK FASTCLIX LANCET) KIT AS DIRECTED 1 kit 0   levothyroxine (SYNTHROID) 112 MCG tablet TAKE ONE TABLET EVERY DAY 90 tablet 2   losartan (COZAAR) 50 MG tablet Take 1 tablet (50 mg total) by mouth daily. 90 tablet 3   Multiple Vitamins-Minerals (PRESERVISION AREDS 2 PO) Take 1 tablet by mouth daily.     nateglinide (STARLIX) 60 MG tablet TAKE 1/2 TABLET THREE TIMES DAILY WITH meals 135 tablet 3   Omega-3 Fatty Acids (FISH OIL) 1200 MG CAPS Take 1 capsule by mouth daily.     pantoprazole (PROTONIX) 40 MG tablet TAKE ONE TABLET DAILY 90 tablet 2   PROCTO-MED HC 2.5 % rectal cream APPLY TWICE DAILY RECTALLY 28 g 1   REPATHA SURECLICK  140 MG/ML SOAJ INJECT 1 DOSE INTO THE SKIN EVERY 14 DAYS 2 mL 11   sitaGLIPtin (JANUVIA) 50 MG tablet Take 1 tablet (50 mg total) by mouth daily. 90 tablet 3   tamsulosin (FLOMAX) 0.4 MG CAPS capsule TAKE ONE CAPSULE EACH DAY 90 capsule 3   TURMERIC CURCUMIN PO Take 500 mg by mouth daily.     vitamin B-12 (CYANOCOBALAMIN) 1000 MCG tablet Take 1,000 mcg by mouth daily.     No current facility-administered medications for this visit.    REVIEW OF SYSTEMS (negative unless checked):   Cardiac:  []  Chest pain or chest pressure? []  Shortness of breath upon activity? []  Shortness of breath when lying flat? []  Irregular heart rhythm?  Vascular:  []  Pain in calf, thigh, or  hip brought on by walking? []  Pain in feet at night that wakes you up from your sleep? []  Blood clot in your veins? []  Leg swelling?  Pulmonary:  []  Oxygen at home? []  Productive cough? []  Wheezing?  Neurologic:  []  Sudden weakness in arms or legs? []  Sudden numbness in arms or legs? []  Sudden onset of difficult speaking or slurred speech? []  Temporary loss of vision in one eye? []  Problems with dizziness?  Gastrointestinal:  []  Blood in stool? []  Vomited blood?  Genitourinary:  []  Burning when urinating? []  Blood in urine?  Psychiatric:  []  Major depression  Hematologic:  []  Bleeding problems? []  Problems with blood clotting?  Dermatologic:  []  Rashes or ulcers?  Constitutional:  []  Fever or chills?  Ear/Nose/Throat:  []  Change in hearing? []  Nose bleeds? []  Sore throat?  Musculoskeletal:  []  Back pain? []  Joint pain? []  Muscle pain?   Physical Examination   Vitals:   11/05/22 0827 11/05/22 0828  BP: (!) 159/79 (!) 155/74  Pulse: 65   Temp: 97.7 F (36.5 C)   TempSrc: Temporal   SpO2: 94%   Weight: 251 lb (113.9 kg)    Body mass index is 35.51 kg/m.  General:  WDWN in NAD; vital signs documented above Gait: Not observed HENT: WNL, normocephalic Pulmonary: normal non-labored breathing , without rales, rhonchi,  wheezing Cardiac: regular HR, without murmurs without carotid bruit Abdomen: soft, NT, no masses Skin: without rashes Vascular Exam/Pulses: palpable radial pulses bilaterally Extremities: without ischemic changes, without gangrene , without cellulitis; without open wounds;  Musculoskeletal: no muscle wasting or atrophy  Neurologic: A&O X 3;  No focal weakness or paresthesias are detected Psychiatric:  The pt has Normal affect.  Non-Invasive Vascular Imaging   Bilateral Carotid Duplex (11/05/2022):  R ICA stenosis:  40-59% R VA:  patent and antegrade L ICA stenosis:   patent stent without stenosis L VA:  patent and  antegrade   Medical Decision Making   Joseph Villa is a 82 y.o. male who presents for surveillance of carotid artery stenosis  Based on the patient's vascular studies, his carotid artery stenosis is unchanged.  Right ICA stenosis is 40 to 59%.  Left ICA stent is patent without stenosis He denies any strokelike symptoms such as slurred speech, facial droop, sudden visual changes, or sudden weakness/numbness. No recent diagnosis of CVA or TIA. He has palpable and equal radial pulses bilaterally He will continue his aspirin, plavix, and repatha and follow up with our office in 1 year with carotid duplex   Loel Dubonnet PA-C [facility] Office: [phone]  Clinic MD: Steve Rattler

## 2022-11-07 ENCOUNTER — Other Ambulatory Visit: Payer: Self-pay

## 2022-11-07 DIAGNOSIS — I6523 Occlusion and stenosis of bilateral carotid arteries: Secondary | ICD-10-CM

## 2022-11-25 ENCOUNTER — Other Ambulatory Visit: Payer: Self-pay | Admitting: Internal Medicine

## 2023-01-01 ENCOUNTER — Other Ambulatory Visit: Payer: Self-pay | Admitting: Internal Medicine

## 2023-01-07 ENCOUNTER — Other Ambulatory Visit: Payer: Self-pay | Admitting: Internal Medicine

## 2023-01-17 ENCOUNTER — Other Ambulatory Visit: Payer: Self-pay | Admitting: Internal Medicine

## 2023-01-17 DIAGNOSIS — E785 Hyperlipidemia, unspecified: Secondary | ICD-10-CM

## 2023-01-20 NOTE — Telephone Encounter (Signed)
Will not refill at this time - needs appt and updated lipid profile.  Dr. Rexene Edison

## 2023-01-22 MED ORDER — REPATHA SURECLICK 140 MG/ML ~~LOC~~ SOAJ
140.0000 mg | SUBCUTANEOUS | 0 refills | Status: AC
Start: 2023-01-22 — End: ?

## 2023-01-22 NOTE — Telephone Encounter (Signed)
Left message for patient that fasting labs (NMR) have been ordered and ideally should be completed before his 8/28 visit with Marcelino Duster NP

## 2023-01-27 ENCOUNTER — Other Ambulatory Visit (HOSPITAL_BASED_OUTPATIENT_CLINIC_OR_DEPARTMENT_OTHER): Payer: Self-pay | Admitting: Internal Medicine

## 2023-01-27 DIAGNOSIS — E785 Hyperlipidemia, unspecified: Secondary | ICD-10-CM | POA: Diagnosis not present

## 2023-01-28 LAB — NMR, LIPOPROFILE
Cholesterol, Total: 161 mg/dL (ref 100–199)
HDL Particle Number: 38.3 umol/L (ref >=30.5)
HDL-C: 51 mg/dL (ref >39)
LDL Particle Number: 934 nmol/L (ref <1000)
LDL Size: 19.9 nm — ABNORMAL LOW (ref >20.5)
LDL-C (NIH Calc): 67 mg/dL (ref 0–99)
LP-IR Score: 93 — ABNORMAL HIGH (ref <=45)
Small LDL Particle Number: 692 nmol/L — ABNORMAL HIGH (ref <=527)
Triglycerides: 271 mg/dL — ABNORMAL HIGH (ref 0–149)

## 2023-01-28 NOTE — Progress Notes (Unsigned)
Cardiology Office Note:  .   Date:  01/29/2023  ID:  AVRAHOM MANWELL, DOB Jun 22, 1940, MRN [id number] PCP: Corwin Levins, MD  [facility] Providers Cardiologist:  None    Patient Profile: .      PMH Dyslipidemia Stroke Carotid artery stenosis Statin myalgias COVID pneumonia requiring oxygen  He was referred to Dr. Rennis Golden for evaluation of dyslipidemia.  He was found to have remote strokes on cerebral imaging and history of amaurosis fugax.  He was subsequently found to have 80 to 99% left carotid artery stenosis and underwent TCAR with successful result.  At the time of referral, he was tolerating ezetimibe but LDL remained elevated at 118.  He had been working on reducing saturated fats.  He was started on Repatha. At follow-up visit 06/06/21, he had minimal reduction in cholesterol and LDL was higher at 130.  Injection protocol was reviewed and he seemed to be doing it appropriately.  Dr. Rennis Golden felt response unclear but could be due to mutations of PCSK9 or high LP(a) levels.  On lab work pleated 01/04/2022, LDL was 38.  Triglycerides were greater than 400. Per note review, he remained on Repatha and ezetimibe.        History of Present Illness: .   HEAROLD TOUGH is a very pleasant 82 y.o. male who is here alone today for follow-up of dyslipidemia. Most recent NMR reveals LDL-C 67, triglycerides 271, LDL particle number 934. We had a lengthy discussion about triglycerides.  He admits to eating a great deal of processed meats, hamburger, ham, especially bacon recently. Has been obsessed with BLT sandwiches this summer.  Eats 2-3 chocolate chip cookies most days, drinks sugar free Sprite. Loves fruit juice but tries to avoid it. Generally eats  3 eggs and morningside sausage (soy) each morning.  He reports he is not having any concerning cardiac symptoms.   ROS: See HPI       Studies Reviewed: .        Risk Assessment/Calculations:             Physical Exam:   VS:  BP 128/62    Pulse 63   Ht 5\' 10"  (1.778 m)   Wt 252 lb (114.3 kg)   BMI 36.16 kg/m    Wt Readings from Last 3 Encounters:  01/29/23 252 lb (114.3 kg)  11/05/22 251 lb (113.9 kg)  09/20/22 247 lb (112 kg)    GEN: Obese, well developed in no acute distress NECK: No JVD; No carotid bruits CARDIAC: RRR, no murmurs, rubs, gallops RESPIRATORY:  Clear to auscultation without rales, wheezing or rhonchi  ABDOMEN: Soft, non-tender, protuberant EXTREMITIES:  No edema; No deformity     ASSESSMENT AND PLAN: .    Dyslipidemia: Triglycerides were elevated at 436 last year, improved to 271 on 01/27/23.  We discussed goal of triglycerides < 150 and how fluctuations occur based on timing, fasting, last meal.  Lengthy discussion about diet.  I have encouraged him to work on incorporating more whole grains, lean protein, fruits and vegetables. LDL is well controlled at 67, however it was 38 last year. He is not having any problems with Repatha. Encouraged regular physical activity as an additional tool to lower triglycerides particularly.  I would like to recheck lipid panel in 6 months rather than waiting 1 year due to fluctuations in triglycerides and LDL. He will return for fasting lab in 6 months. Plan to follow-up in 1 year unless lipid panel reveals significant  concern.        Dispo: 1 year with Dr. Rennis Golden or me  Signed, Eligha Bridegroom, NP-C

## 2023-01-29 ENCOUNTER — Ambulatory Visit (HOSPITAL_BASED_OUTPATIENT_CLINIC_OR_DEPARTMENT_OTHER): Payer: Medicare Other | Admitting: Nurse Practitioner

## 2023-01-29 ENCOUNTER — Encounter (HOSPITAL_BASED_OUTPATIENT_CLINIC_OR_DEPARTMENT_OTHER): Payer: Self-pay | Admitting: Nurse Practitioner

## 2023-01-29 VITALS — BP 128/62 | HR 63 | Ht 70.0 in | Wt 252.0 lb

## 2023-01-29 DIAGNOSIS — T466X5D Adverse effect of antihyperlipidemic and antiarteriosclerotic drugs, subsequent encounter: Secondary | ICD-10-CM

## 2023-01-29 DIAGNOSIS — E785 Hyperlipidemia, unspecified: Secondary | ICD-10-CM

## 2023-01-29 DIAGNOSIS — M791 Myalgia, unspecified site: Secondary | ICD-10-CM

## 2023-01-29 DIAGNOSIS — T466X5A Adverse effect of antihyperlipidemic and antiarteriosclerotic drugs, initial encounter: Secondary | ICD-10-CM

## 2023-01-29 NOTE — Patient Instructions (Signed)
Medication Instructions:  Your physician recommends that you continue on your current medications as directed. Please refer to the Current Medication list given to you today.  *If you need a refill on your cardiac medications before your next appointment, please call your pharmacy*   Lab Work: Your physician recommends that you return for lab work in 6 months for Fasting Lipid Panel   Follow-Up: At [facility], you and your health needs are our priority.  As part of our continuing mission to provide you with exceptional heart care, we have created designated Provider Care Teams.  These Care Teams include your primary Cardiologist (physician) and Advanced Practice Providers (APPs -  Physician Assistants and Nurse Practitioners) who all work together to provide you with the care you need, when you need it.  We recommend signing up for the patient portal called "MyChart".  Sign up information is provided on this After Visit Summary.  MyChart is used to connect with patients for Virtual Visits (Telemedicine).  Patients are able to view lab/test results, encounter notes, upcoming appointments, etc.  Non-urgent messages can be sent to your provider as well.   To learn more about what you can do with MyChart, go to [email or web address].    Your next appointment:   1 year follow up with Dr. Rennis Golden or Eligha Bridegroom, NP in LIPID CL[facility]IC   Other Instructions Adopting a Healthy Lifestyle.   Weight: Know what a healthy weight is for you (roughly BMI <25) and aim to maintain this. You can calculate your body mass index on your smart phone. Unfortunately, this is not the most accurate measure of healthy weight, but it is the simplest measurement to use. A more accurate measurement involves body scanning which measures lean muscle, fat tissue and bony density. We do not have this equipment at [facility].    Diet: Aim for 7+ servings of fruits and vegetables daily Limit animal fats in diet for  cholesterol and heart health - choose grass fed whenever available Avoid highly processed foods (fast food burgers, tacos, fried chicken, pizza, hot dogs, french fries)  Saturated fat comes in the form of butter, lard, coconut oil, margarine, partially hydrogenated oils, and fat in meat. These increase your risk of cardiovascular disease.  Use healthy plant oils, such as olive, canola, soy, corn, sunflower and peanut.  Whole foods such as fruits, vegetables and whole grains have fiber  Men need > 38 grams of fiber per day Women need > 25 grams of fiber per day  Load up on vegetables and fruits - one-half of your plate: Aim for color and variety, and remember that potatoes dont count. Go for whole grains - one-quarter of your plate: Whole wheat, barley, wheat berries, quinoa, oats, brown rice, and foods made with them. If you want pasta, go with whole wheat pasta. Protein power - one-quarter of your plate: Fish, chicken, beans, and nuts are all healthy, versatile protein sources. Limit red meat. You need carbohydrates for energy! The type of carbohydrate is more important than the amount. Choose carbohydrates such as vegetables, fruits, whole grains, beans, and nuts in the place of white rice, white pasta, potatoes (baked or fried), macaroni and cheese, cakes, cookies, and donuts.  If youre thirsty, drink water. Coffee and tea are good in moderation, but skip sugary drinks and limit milk and dairy products to one or two daily servings. Keep sugar intake at 6 teaspoons or 24 grams or LESS       Exercise: Aim for  150 min of moderate intensity exercise weekly for heart health, and weights twice weekly for bone health Stay active - any steps are better than no steps! Aim for 7-9 hours of sleep daily

## 2023-01-30 ENCOUNTER — Other Ambulatory Visit: Payer: Self-pay | Admitting: Internal Medicine

## 2023-01-31 ENCOUNTER — Encounter: Payer: Self-pay | Admitting: Internal Medicine

## 2023-01-31 NOTE — Telephone Encounter (Signed)
Patient will be seen here on 02/14/2023 - he would like to know if he can get enough medication to last him to his appointment.  Please send to [facility] in [place].  Please call and advise:  [phone]

## 2023-02-05 ENCOUNTER — Other Ambulatory Visit: Payer: Self-pay | Admitting: Internal Medicine

## 2023-02-14 ENCOUNTER — Ambulatory Visit (INDEPENDENT_AMBULATORY_CARE_PROVIDER_SITE_OTHER): Payer: Medicare Other | Admitting: Internal Medicine

## 2023-02-14 VITALS — BP 140/68 | HR 63 | Temp 97.8°F | Ht 70.0 in | Wt 252.4 lb

## 2023-02-14 DIAGNOSIS — D649 Anemia, unspecified: Secondary | ICD-10-CM | POA: Diagnosis not present

## 2023-02-14 DIAGNOSIS — E785 Hyperlipidemia, unspecified: Secondary | ICD-10-CM

## 2023-02-14 DIAGNOSIS — G4700 Insomnia, unspecified: Secondary | ICD-10-CM

## 2023-02-14 DIAGNOSIS — E039 Hypothyroidism, unspecified: Secondary | ICD-10-CM

## 2023-02-14 DIAGNOSIS — E559 Vitamin D deficiency, unspecified: Secondary | ICD-10-CM

## 2023-02-14 DIAGNOSIS — E538 Deficiency of other specified B group vitamins: Secondary | ICD-10-CM

## 2023-02-14 DIAGNOSIS — E1169 Type 2 diabetes mellitus with other specified complication: Secondary | ICD-10-CM

## 2023-02-14 DIAGNOSIS — I152 Hypertension secondary to endocrine disorders: Secondary | ICD-10-CM

## 2023-02-14 DIAGNOSIS — N1832 Chronic kidney disease, stage 3b: Secondary | ICD-10-CM

## 2023-02-14 DIAGNOSIS — E1159 Type 2 diabetes mellitus with other circulatory complications: Secondary | ICD-10-CM

## 2023-02-14 DIAGNOSIS — Z23 Encounter for immunization: Secondary | ICD-10-CM

## 2023-02-14 DIAGNOSIS — Z0001 Encounter for general adult medical examination with abnormal findings: Secondary | ICD-10-CM | POA: Diagnosis not present

## 2023-02-14 DIAGNOSIS — K7460 Unspecified cirrhosis of liver: Secondary | ICD-10-CM | POA: Insufficient documentation

## 2023-02-14 DIAGNOSIS — E1122 Type 2 diabetes mellitus with diabetic chronic kidney disease: Secondary | ICD-10-CM | POA: Diagnosis not present

## 2023-02-14 DIAGNOSIS — N182 Chronic kidney disease, stage 2 (mild): Secondary | ICD-10-CM

## 2023-02-14 LAB — IBC PANEL
Iron: 92 ug/dL (ref 42–165)
Saturation Ratios: 33.4 % (ref 20.0–50.0)
TIBC: 275.8 ug/dL (ref 250.0–450.0)
Transferrin: 197 mg/dL — ABNORMAL LOW (ref 212.0–360.0)

## 2023-02-14 LAB — CBC WITH DIFFERENTIAL/PLATELET
Basophils Absolute: 0.1 10*3/uL (ref 0.0–0.1)
Basophils Relative: 1.1 % (ref 0.0–3.0)
Eosinophils Absolute: 0.5 10*3/uL (ref 0.0–0.7)
Eosinophils Relative: 7.2 % — ABNORMAL HIGH (ref 0.0–5.0)
HCT: 39.9 % (ref 39.0–52.0)
Hemoglobin: 12.9 g/dL — ABNORMAL LOW (ref 13.0–17.0)
Lymphocytes Relative: 24.3 % (ref 12.0–46.0)
Lymphs Abs: 1.8 10*3/uL (ref 0.7–4.0)
MCHC: 32.4 g/dL (ref 30.0–36.0)
MCV: 90.7 fl (ref 78.0–100.0)
Monocytes Absolute: 0.7 10*3/uL (ref 0.1–1.0)
Monocytes Relative: 8.9 % (ref 3.0–12.0)
Neutro Abs: 4.3 10*3/uL (ref 1.4–7.7)
Neutrophils Relative %: 58.5 % (ref 43.0–77.0)
Platelets: 246 10*3/uL (ref 150.0–400.0)
RBC: 4.4 Mil/uL (ref 4.22–5.81)
RDW: 13.8 % (ref 11.5–15.5)
WBC: 7.4 10*3/uL (ref 4.0–10.5)

## 2023-02-14 LAB — HEPATIC FUNCTION PANEL
ALT: 22 U/L (ref 0–53)
AST: 20 U/L (ref 0–37)
Albumin: 3.9 g/dL (ref 3.5–5.2)
Alkaline Phosphatase: 84 U/L (ref 39–117)
Bilirubin, Direct: 0 mg/dL (ref 0.0–0.3)
Total Bilirubin: 0.2 mg/dL (ref 0.2–1.2)
Total Protein: 6.9 g/dL (ref 6.0–8.3)

## 2023-02-14 LAB — LIPID PANEL
Cholesterol: 117 mg/dL (ref 0–200)
HDL: 49 mg/dL (ref >39.00)
Total CHOL/HDL Ratio: 2
Triglycerides: 409 mg/dL — ABNORMAL HIGH (ref 0.0–149.0)

## 2023-02-14 LAB — URINALYSIS, ROUTINE W REFLEX MICROSCOPIC
Bilirubin Urine: NEGATIVE
Hgb urine dipstick: NEGATIVE
Ketones, ur: NEGATIVE
Leukocytes,Ua: NEGATIVE
Nitrite: NEGATIVE
RBC / HPF: NONE SEEN (ref 0–?)
Specific Gravity, Urine: 1.02 (ref 1.000–1.030)
Total Protein, Urine: 100 — AB
Urine Glucose: >=1000 — AB
Urobilinogen, UA: 0.2 (ref 0.0–1.0)
WBC, UA: NONE SEEN (ref 0–?)
pH: 5.5 (ref 5.0–8.0)

## 2023-02-14 LAB — BASIC METABOLIC PANEL
BUN: 36 mg/dL — ABNORMAL HIGH (ref 6–23)
CO2: 26 meq/L (ref 19–32)
Calcium: 9.1 mg/dL (ref 8.4–10.5)
Chloride: 106 meq/L (ref 96–112)
Creatinine, Ser: 1.88 mg/dL — ABNORMAL HIGH (ref 0.40–1.50)
GFR: 33.48 mL/min — ABNORMAL LOW (ref >60.00)
Glucose, Bld: 119 mg/dL — ABNORMAL HIGH (ref 70–99)
Potassium: 4.6 meq/L (ref 3.5–5.1)
Sodium: 139 meq/L (ref 135–145)

## 2023-02-14 LAB — FERRITIN: Ferritin: 214 ng/mL (ref 22.0–322.0)

## 2023-02-14 LAB — MICROALBUMIN / CREATININE URINE RATIO
Creatinine,U: 84.1 mg/dL
Microalb Creat Ratio: 64.9 mg/g — ABNORMAL HIGH (ref 0.0–30.0)
Microalb, Ur: 54.6 mg/dL — ABNORMAL HIGH (ref 0.0–1.9)

## 2023-02-14 LAB — VITAMIN D 25 HYDROXY (VIT D DEFICIENCY, FRACTURES): VITD: 35.01 ng/mL (ref 30.00–100.00)

## 2023-02-14 LAB — TSH: TSH: 4.92 u[IU]/mL (ref 0.35–5.50)

## 2023-02-14 LAB — LDL CHOLESTEROL, DIRECT: Direct LDL: 48 mg/dL

## 2023-02-14 LAB — VITAMIN B12: Vitamin B-12: 398 pg/mL (ref 211–911)

## 2023-02-14 LAB — HEMOGLOBIN A1C: Hgb A1c MFr Bld: 6.9 % — ABNORMAL HIGH (ref 4.6–6.5)

## 2023-02-14 MED ORDER — CLOPIDOGREL BISULFATE 75 MG PO TABS: 75.0000 mg | ORAL_TABLET | Freq: Every day | ORAL | 3 refills | Status: DC

## 2023-02-14 MED ORDER — EZETIMIBE 10 MG PO TABS: 10.0000 mg | ORAL_TABLET | Freq: Every day | ORAL | 3 refills | Status: DC

## 2023-02-14 MED ORDER — CARBAMAZEPINE 200 MG PO TABS: 400.0000 mg | ORAL_TABLET | Freq: Every day | ORAL | 1 refills | Status: DC

## 2023-02-14 MED ORDER — LOSARTAN POTASSIUM 50 MG PO TABS: 50.0000 mg | ORAL_TABLET | Freq: Every day | ORAL | 3 refills | Status: DC

## 2023-02-14 MED ORDER — DAPAGLIFLOZIN PROPANEDIOL 10 MG PO TABS: 10.0000 mg | ORAL_TABLET | Freq: Every day | ORAL | 3 refills | Status: DC

## 2023-02-14 NOTE — Patient Instructions (Signed)

## 2023-02-14 NOTE — Progress Notes (Unsigned)
Patient ID: Joseph Villa, male   DOB: 1941/01/07, 82 y.o.   MRN: [id number]         Chief Complaint:: wellness exam and dm, anemia, htn, hld, low thyroid       HPI:  Joseph Villa is a 82 y.o. male here for wellness exam; for shingrix at pharmacy, declines colonoscopy, o/w up to date                        Also Pt denies chest pain, increased sob or doe, wheezing, orthopnea, PND, increased LE swelling, palpitations, dizziness or syncope.   Pt denies polydipsia, polyuria, or new focal neuro s/s.    Pt denies fever, wt loss, night sweats, loss of appetite, or other constitutional symptoms  no overt bleeding or bruising   Wt Readings from Last 3 Encounters:  02/14/23 252 lb 6.4 oz (114.5 kg)  01/29/23 252 lb (114.3 kg)  11/05/22 251 lb (113.9 kg)   BP Readings from Last 3 Encounters:  02/14/23 (!) 140/68  01/29/23 128/62  11/05/22 (!) 155/74   Immunization History  Administered Date(s) Administered   Fluad Quad(high Dose 65+) 02/19/2019, 04/15/2020, 03/09/2021, 03/12/2022   Fluad Trivalent(High Dose 65+) 02/14/2023   H1N1 05/09/2008   Influenza Split 03/21/2011, 03/13/2012   Influenza Whole 03/03/2009, 03/08/2010   Influenza, High Dose Seasonal PF 03/17/2015, 03/12/2016, 03/19/2017, 03/02/2018   Influenza,inj,Quad PF,6+ Mos 04/05/2013, 02/15/2014   PFIZER(Purple Top)SARS-COV-2 Vaccination 09/02/2019, 10/02/2019, 05/18/2020   Pneumococcal Conjugate-13 10/13/2013   Pneumococcal Polysaccharide-23 06/04/2007, 10/31/2017   Td 06/04/2007   Tdap 03/21/2011   Health Maintenance Due  Topic Date Due   Zoster Vaccines- Shingrix (1 of 2) Never done      Past Medical History:  Diagnosis Date   ALLERGIC ASTHMA 11/25/2007   ALLERGIC RHINITIS 11/25/2007   Allergy    Arthritis, lumbar spine 04/18/2011   BIPOLAR DISORDER UNSPECIFIED 10/06/2009   Blood transfusion without reported diagnosis    s/p goiter surgery age 89    COLONIC POLYPS, HX OF 10/06/2009   Degenerative arthritis of  hip 04/18/2011   DJD (degenerative joint disease)    DM w/o Complication Type II 05/10/2008   GERD 10/06/2009   Hemorrhage yrs ago   after goiter surgery, tracheostomy inserted and later removed   Hemorrhoid    History of kidney stones    HYPERLIPIDEMIA 10/06/2009   HYPERTENSION, BENIGN 07/27/2009   HYPOTHYROIDISM 10/06/2009   NEPHROLITHIASIS, HX OF 10/06/2009   OBSTRUCTIVE SLEEP APNEA 11/25/2007   cpap setting of 9   Peptic stricture of esophagus    RBBB (right bundle branch block)    Sleep apnea    wears c pap   Stroke [facility])    Past Surgical History:  Procedure Laterality Date   COLONOSCOPY     GANGLION CYST EXCISION     2-3 cysts removed   HERNIA REPAIR     JOINT REPLACEMENT     thyroid Goiter surgery     TONSILLECTOMY     TONSILLECTOMY     TOTAL HIP ARTHROPLASTY  06/16/2012   Procedure: TOTAL HIP ARTHROPLASTY ANTERIOR APPROACH;  Surgeon: Shelda Pal, MD;  Location: [facility];  Service: Orthopedics;  Laterality: Left;   TRANSCAROTID ARTERY REVASCULARIZATION  Left 11/20/2020   Procedure: LEFT TRANSCAROTID ARTERY REVASCULARIZATION;  Surgeon: Cephus Shelling, MD;  Location: [facility] OR;  Service: Vascular;  Laterality: Left;   UMBILICAL HERNIA REPAIR  several yrs ago   UPPER GASTROINTESTINAL ENDOSCOPY  reports that he quit smoking about 45 years ago. His smoking use included cigarettes and pipe. He started smoking about 60 years ago. He has a 22.5 pack-year smoking history. He has never used smokeless tobacco. He reports that he does not drink alcohol and does not use drugs. family history includes Alcohol abuse in his brother; Alcoholism in an other family member; Breast cancer in his father; Diabetes in his brother; Drug abuse in his daughter; Throat cancer in his mother. Allergies  Allergen Reactions   Cefuroxime Axetil Anaphylaxis    REACTION: anaphylaxis-ceftin    Cefprozil     REACTION: unknown   Cephalosporins Nausea Only   Crestor [Rosuvastatin Calcium]      Muscle aches    Grass Pollen(K-O-R-T-Swt Vern)    Lipitor [Atorvastatin]     myalgiaas   Metformin And Related Diarrhea   Rabeprazole Sodium Nausea Only    REACTION: nausea   Current Outpatient Medications on File Prior to Visit  Medication Sig Dispense Refill   Accu-Chek FastClix Lancets MISC USE TWICE DAILY 102 each 2   ACCU-CHEK GUIDE test strip CHECK BLOOD GLUCOSE (SUGAR) TWICE DAILY 200 strip 3   albuterol (VENTOLIN HFA) 108 (90 Base) MCG/ACT inhaler INHALE 2 PUFFS 4 TIMES DAILY AS NEEDED 8.5 g 0   amLODipine (NORVASC) 10 MG tablet TAKE ONE TABLET EACH DAY 90 tablet 3   aspirin 81 MG chewable tablet Chew 1 tablet (81 mg total) by mouth daily. 90 tablet 3   bromocriptine (PARLODEL) 2.5 MG tablet Take 0.5 tablets (1.25 mg total) by mouth daily. 45 tablet 3   Cholecalciferol (VITAMIN D3) 2000 UNITS TABS Take 2,000 Units by mouth every morning.      desoximetasone (TOPICORT) 0.25 % cream Apply 1 application topically 2 (two) times daily. (Patient taking differently: Apply 1 application  topically 2 (two) times daily as needed (psoriasis).) 100 g 0   Evolocumab (REPATHA SURECLICK) 140 MG/ML SOAJ Inject 140 mg into the skin every 14 (fourteen) days. MUST KEEP 8/28 appointment. 2 mL 0   fluticasone (FLONASE) 50 MCG/ACT nasal spray ONE SPRAY INTO NOSE EVERY DAY (Patient taking differently: Place 2 sprays into both nostrils daily as needed for rhinitis or allergies.) 48 g 1   Glucosamine HCl (GLUCOSAMINE PO) Take 1 tablet by mouth daily.     glucose blood (ACCU-CHEK AVIVA PLUS) test strip Used to check blood sugars twice a day Dx E11.9 200 each 0   glucose blood test strip Use to check blood sugar 2 times per day. DX Code: E11.9 200 each 2   hydrocortisone (PROCTO-MED HC) 2.5 % rectal cream Place rectally 2 (two) times daily. Annual appt is due  must see provider for future refills 30 g 0   ketoconazole (NIZORAL) 2 % cream Apply topically daily.     Lancets Misc. (ACCU-CHEK FASTCLIX LANCET) KIT  AS DIRECTED 1 kit 0   levothyroxine (SYNTHROID) 112 MCG tablet TAKE ONE TABLET EVERY DAY 90 tablet 2   Multiple Vitamins-Minerals (PRESERVISION AREDS 2 PO) Take 1 tablet by mouth daily.     nateglinide (STARLIX) 60 MG tablet TAKE 1/2 TABLET THREE TIMES DAILY WITH meals 135 tablet 3   Omega-3 Fatty Acids (FISH OIL) 1200 MG CAPS Take 1 capsule by mouth daily.     pantoprazole (PROTONIX) 40 MG tablet TAKE ONE TABLET DAILY 90 tablet 2   sitaGLIPtin (JANUVIA) 50 MG tablet Take 1 tablet (50 mg total) by mouth daily. 90 tablet 3   tamsulosin (FLOMAX) 0.4 MG CAPS  capsule TAKE ONE CAPSULE EACH DAY 90 capsule 3   TURMERIC CURCUMIN PO Take 500 mg by mouth daily.     vitamin B-12 (CYANOCOBALAMIN) 1000 MCG tablet Take 1,000 mcg by mouth daily.     No current facility-administered medications on file prior to visit.        ROS:  All others reviewed and negative.  Objective        PE:  BP (!) 140/68 (BP Location: Left Arm, Patient Position: Sitting, Cuff Size: Large)   Pulse 63   Temp 97.8 F (36.6 C) (Oral)   Ht 5\' 10"  (1.778 m)   Wt 252 lb 6.4 oz (114.5 kg)   SpO2 96%   BMI 36.22 kg/m                 Constitutional: Pt appears in NAD               HENT: Head: NCAT.                Right Ear: External ear normal.                 Left Ear: External ear normal.                Eyes: . Pupils are equal, round, and reactive to light. Conjunctivae and EOM are normal               Nose: without d/c or deformity               Neck: Neck supple. Gross normal ROM               Cardiovascular: Normal rate and regular rhythm.                 Pulmonary/Chest: Effort normal and breath sounds without rales or wheezing.                Abd:  Soft, NT, ND, + BS, no organomegaly               Neurological: Pt is alert. At baseline orientation, motor grossly intact               Skin: Skin is warm. No rashes, no other new lesions, LE edema - none               Psychiatric: Pt behavior is normal without agitation    Micro: none  Cardiac tracings I have personally interpreted today:  none  Pertinent Radiological findings (summarize): none   Lab Results  Component Value Date   WBC 7.4 02/14/2023   HGB 12.9 (L) 02/14/2023   HCT 39.9 02/14/2023   PLT 246.0 02/14/2023   GLUCOSE 119 (H) 02/14/2023   CHOL 117 02/14/2023   TRIG (H) 02/14/2023    409.0 Triglyceride is over 400; calculations on Lipids are invalid.   HDL 49.00 02/14/2023   LDLDIRECT 48.0 02/14/2023   LDLCALC 118 (H) 11/21/2020   ALT 22 02/14/2023   AST 20 02/14/2023   NA 139 02/14/2023   K 4.6 02/14/2023   CL 106 02/14/2023   CREATININE 1.88 (H) 02/14/2023   BUN 36 (H) 02/14/2023   CO2 26 02/14/2023   TSH 4.92 02/14/2023   PSA 0.84 10/23/2020   INR 1.0 11/16/2020   HGBA1C 6.9 (H) 02/14/2023   MICROALBUR 54.6 (H) 02/14/2023   Assessment/Plan:  Joseph Villa is a 82 y.o. White or Caucasian [1] male with  has a past  medical history of ALLERGIC ASTHMA (11/25/2007), ALLERGIC RHINITIS (11/25/2007), Allergy, Arthritis, lumbar spine (04/18/2011), BIPOLAR DISORDER UNSPECIFIED (10/06/2009), Blood transfusion without reported diagnosis, COLONIC POLYPS, HX OF (10/06/2009), Degenerative arthritis of hip (04/18/2011), DJD (degenerative joint disease), DM w/o Complication Type II (05/10/2008), GERD (10/06/2009), Hemorrhage (yrs ago), Hemorrhoid, History of kidney stones, HYPERLIPIDEMIA (10/06/2009), HYPERTENSION, BENIGN (07/27/2009), HYPOTHYROIDISM (10/06/2009), NEPHROLITHIASIS, HX OF (10/06/2009), OBSTRUCTIVE SLEEP APNEA (11/25/2007), Peptic stricture of esophagus, RBBB (right bundle branch block), Sleep apnea, and Stroke (HCC).  Encounter for well adult exam with abnormal findings Age and sex appropriate education and counseling updated with regular exercise and diet Referrals for preventative services - declines colonoscopy Immunizations addressed - for shingrix at pharmacy Smoking counseling  - none needed Evidence for depression or  other mood disorder - none significant Most recent labs reviewed. I have personally reviewed and have noted: 1) the patient's medical and social history 2) The patient's current medications and supplements 3) The patient's height, weight, and BMI have been recorded in the chart   Anemia With recent worsening mild by labs, for f/u cbc and labs today  CKD (chronic kidney disease), stage III (HCC) Lab Results  Component Value Date   CREATININE 1.88 (H) 02/14/2023   Stable overall, cont to avoid nephrotoxins   Hyperlipidemia associated with type 2 diabetes mellitus (HCC) Lab Results  Component Value Date   LDLCALC 118 (H) 11/21/2020   Uncontrolled,  pt to continue repatha 140, and zetia 10 mg every day, declines other change today   Hypertension associated with diabetes (HCC) . BP Readings from Last 3 Encounters:  02/14/23 (!) 140/68  01/29/23 128/62  11/05/22 (!) 155/74   Uncontrolled, pt states controlled at home,  pt to continue medical treatment norvasc 10 every day, losartan 50 qd   Hypothyroidism Lab Results  Component Value Date   TSH 4.92 02/14/2023   Stable, pt to continue levothyroxine 112 mcg qd   Type 2 diabetes mellitus (HCC) Lab Results  Component Value Date   HGBA1C 6.9 (H) 02/14/2023   Stable, pt to continue current medical treatment starlix 60 tid, januvia 50 qd   Vitamin D deficiency Last vitamin D Lab Results  Component Value Date   VD25OH 35.01 02/14/2023   Low to start oral replacement  Followup: Return in about 1 year (around 02/14/2024).  Oliver Barre, MD 02/16/2023 3:51 PM [facility]

## 2023-02-14 NOTE — Progress Notes (Signed)
The test results show that your current treatment is OK, as the tests are stable.  Please continue the same plan.  There is no other need for change of treatment or further evaluation based on these results, at this time.  thanks

## 2023-02-15 LAB — CARBAMAZEPINE LEVEL, TOTAL: Carbamazepine Lvl: 6.1 mg/L (ref 4.0–12.0)

## 2023-02-16 ENCOUNTER — Encounter: Payer: Self-pay | Admitting: Internal Medicine

## 2023-02-16 DIAGNOSIS — D649 Anemia, unspecified: Secondary | ICD-10-CM | POA: Insufficient documentation

## 2023-02-16 DIAGNOSIS — E559 Vitamin D deficiency, unspecified: Secondary | ICD-10-CM | POA: Insufficient documentation

## 2023-02-16 NOTE — Assessment & Plan Note (Signed)
.   BP Readings from Last 3 Encounters:  02/14/23 (!) 140/68  01/29/23 128/62  11/05/22 (!) 155/74   Uncontrolled, pt states controlled at home,  pt to continue medical treatment norvasc 10 every day, losartan 50 qd

## 2023-02-16 NOTE — Assessment & Plan Note (Signed)
With recent worsening mild by labs, for f/u cbc and labs today

## 2023-02-16 NOTE — Assessment & Plan Note (Signed)
Lab Results  Component Value Date   CREATININE 1.88 (H) 02/14/2023   Stable overall, cont to avoid nephrotoxins

## 2023-02-16 NOTE — Assessment & Plan Note (Signed)
Lab Results  Component Value Date   TSH 4.92 02/14/2023   Stable, pt to continue levothyroxine 112 mcg qd

## 2023-02-16 NOTE — Assessment & Plan Note (Signed)
Age and sex appropriate education and counseling updated with regular exercise and diet Referrals for preventative services - declines colonoscopy Immunizations addressed - for shingrix at pharmacy Smoking counseling  - none needed Evidence for depression or other mood disorder - none significant Most recent labs reviewed. I have personally reviewed and have noted: 1) the patient's medical and social history 2) The patient's current medications and supplements 3) The patient's height, weight, and BMI have been recorded in the chart

## 2023-02-16 NOTE — Assessment & Plan Note (Addendum)
Lab Results  Component Value Date   LDLCALC 118 (H) 11/21/2020   Uncontrolled,  pt to continue repatha 140, and zetia 10 mg every day, declines other change today

## 2023-02-16 NOTE — Assessment & Plan Note (Signed)
Last vitamin D Lab Results  Component Value Date   VD25OH 35.01 02/14/2023   Low to start oral replacement

## 2023-02-16 NOTE — Assessment & Plan Note (Signed)
Lab Results  Component Value Date   HGBA1C 6.9 (H) 02/14/2023   Stable, pt to continue current medical treatment starlix 60 tid, januvia 50 qd

## 2023-02-28 ENCOUNTER — Other Ambulatory Visit: Payer: Self-pay | Admitting: Internal Medicine

## 2023-02-28 ENCOUNTER — Other Ambulatory Visit: Payer: Self-pay

## 2023-02-28 DIAGNOSIS — E785 Hyperlipidemia, unspecified: Secondary | ICD-10-CM

## 2023-03-13 DIAGNOSIS — L812 Freckles: Secondary | ICD-10-CM | POA: Diagnosis not present

## 2023-03-13 DIAGNOSIS — L820 Inflamed seborrheic keratosis: Secondary | ICD-10-CM | POA: Diagnosis not present

## 2023-03-13 DIAGNOSIS — L570 Actinic keratosis: Secondary | ICD-10-CM | POA: Diagnosis not present

## 2023-03-13 DIAGNOSIS — L821 Other seborrheic keratosis: Secondary | ICD-10-CM | POA: Diagnosis not present

## 2023-03-13 DIAGNOSIS — Z85828 Personal history of other malignant neoplasm of skin: Secondary | ICD-10-CM | POA: Diagnosis not present

## 2023-03-28 ENCOUNTER — Encounter: Payer: Self-pay | Admitting: Internal Medicine

## 2023-03-28 ENCOUNTER — Ambulatory Visit: Payer: Medicare Other | Admitting: Internal Medicine

## 2023-03-28 VITALS — BP 136/70 | HR 63 | Ht 70.0 in | Wt 250.0 lb

## 2023-03-28 DIAGNOSIS — E1122 Type 2 diabetes mellitus with diabetic chronic kidney disease: Secondary | ICD-10-CM | POA: Diagnosis not present

## 2023-03-28 DIAGNOSIS — Z7984 Long term (current) use of oral hypoglycemic drugs: Secondary | ICD-10-CM | POA: Diagnosis not present

## 2023-03-28 DIAGNOSIS — E1142 Type 2 diabetes mellitus with diabetic polyneuropathy: Secondary | ICD-10-CM | POA: Diagnosis not present

## 2023-03-28 DIAGNOSIS — N1832 Chronic kidney disease, stage 3b: Secondary | ICD-10-CM

## 2023-03-28 LAB — POCT GLUCOSE (DEVICE FOR HOME USE): POC Glucose: 198 mg/dL — AB (ref 70–99)

## 2023-03-28 MED ORDER — BROMOCRIPTINE MESYLATE 2.5 MG PO TABS
1.2500 mg | ORAL_TABLET | Freq: Every day | ORAL | 3 refills | Status: DC
Start: 2023-03-28 — End: 2023-04-29

## 2023-03-28 MED ORDER — NATEGLINIDE 60 MG PO TABS
ORAL_TABLET | ORAL | 3 refills | Status: DC
Start: 2023-03-28 — End: 2023-10-01

## 2023-03-28 MED ORDER — SITAGLIPTIN PHOSPHATE 50 MG PO TABS: 50.0000 mg | ORAL_TABLET | Freq: Every day | ORAL | 3 refills | Status: DC

## 2023-03-28 MED ORDER — DAPAGLIFLOZIN PROPANEDIOL 10 MG PO TABS: 10.0000 mg | ORAL_TABLET | Freq: Every day | ORAL | 3 refills | Status: DC

## 2023-03-28 NOTE — Progress Notes (Signed)
Name: RICKYE CHAI  Age/ Sex: 82 y.o., male   MRN/ DOB: [id number], 08-23-1940     PCP: Corwin Levins, MD   Reason for Endocrinology Evaluation: Type 2 Diabetes Mellitus  Initial Endocrine Consultative Visit: 02/14/2015    PATIENT IDENTIFIER: Joseph Villa is a 82 y.o. male with a past medical history of Dm, dyslipidemia . The patient has followed with Endocrinology clinic since 02/14/2015 for consultative assistance with management of his diabetes.  DIABETIC HISTORY:  Joseph Villa was diagnosed with DM 2007. His hemoglobin A1c has ranged from 5.7% in 2018, peaking at 7.9% in 2019.  He was followed by Dr. Everardo All from 2016 until March 2023   SUBJECTIVE:   During the last visit (09/20/2022): A1c 6.9%  Today (03/28/2023): Joseph Villa is here for follow-up on diabetes management.  He checks his blood sugars 1 times daily. The patient has not had hypoglycemic episodes since the last clinic visit   Patient continues to follow-up with cardiology for dyslipidemia Denies nausea or vomiting  Denies constipation or diarrhea    HOME DIABETES REGIMEN:  Januvia 50, 1 tablet  tablet daily Farxiga 10 mg daily Bromocriptine 2.5 mg, half a tablet daily  Starlix 60 mg , half a tablet TIDQAC   Statin: Patient on Repatha ACE-I/ARB: Yes    METER DOWNLOAD SUMMARY: unable to download    DIABETIC COMPLICATIONS: Microvascular complications:  CKD III, neuropathy Denies:  Last Eye Exam: Completed 06/2022  Macrovascular complications:   Denies: CAD, CVA, PVD   HISTORY:  Past Medical History:  Past Medical History:  Diagnosis Date   ALLERGIC ASTHMA 11/25/2007   ALLERGIC RHINITIS 11/25/2007   Allergy    Arthritis, lumbar spine 04/18/2011   BIPOLAR DISORDER UNSPECIFIED 10/06/2009   Blood transfusion without reported diagnosis    s/p goiter surgery age 82    COLONIC POLYPS, HX OF 10/06/2009   Degenerative arthritis of hip 04/18/2011   DJD (degenerative joint disease)    DM  w/o Complication Type II 05/10/2008   GERD 10/06/2009   Hemorrhage yrs ago   after goiter surgery, tracheostomy inserted and later removed   Hemorrhoid    History of kidney stones    HYPERLIPIDEMIA 10/06/2009   HYPERTENSION, BENIGN 07/27/2009   HYPOTHYROIDISM 10/06/2009   NEPHROLITHIASIS, HX OF 10/06/2009   OBSTRUCTIVE SLEEP APNEA 11/25/2007   cpap setting of 9   Peptic stricture of esophagus    RBBB (right bundle branch block)    Sleep apnea    wears c pap   Stroke [facility])    Past Surgical History:  Past Surgical History:  Procedure Laterality Date   COLONOSCOPY     GANGLION CYST EXCISION     2-3 cysts removed   HERNIA REPAIR     JOINT REPLACEMENT     thyroid Goiter surgery     TONSILLECTOMY     TONSILLECTOMY     TOTAL HIP ARTHROPLASTY  06/16/2012   Procedure: TOTAL HIP ARTHROPLASTY ANTERIOR APPROACH;  Surgeon: Shelda Pal, MD;  Location: [facility];  Service: Orthopedics;  Laterality: Left;   TRANSCAROTID ARTERY REVASCULARIZATION  Left 11/20/2020   Procedure: LEFT TRANSCAROTID ARTERY REVASCULARIZATION;  Surgeon: Cephus Shelling, MD;  Location: [facility];  Service: Vascular;  Laterality: Left;   UMBILICAL HERNIA REPAIR  several yrs ago   UPPER GASTROINTESTINAL ENDOSCOPY     Social History:  reports that he quit smoking about 45 years ago. His smoking use included cigarettes and pipe. He started smoking about 60  years ago. He has a 22.5 pack-year smoking history. He has never used smokeless tobacco. He reports that he does not drink alcohol and does not use drugs. Family History:  Family History  Problem Relation Age of Onset   Throat cancer Mother    Alcohol abuse Brother    Diabetes Brother    Drug abuse Daughter    Alcoholism Other        uncle   Breast cancer Father    Colon cancer Neg Hx    Colon polyps Neg Hx    Esophageal cancer Neg Hx    Rectal cancer Neg Hx    Stomach cancer Neg Hx      HOME MEDICATIONS: Allergies as of 03/28/2023       Reactions    Cefuroxime Axetil Anaphylaxis   REACTION: anaphylaxis-ceftin   Cefprozil    REACTION: unknown   Cephalosporins Nausea Only   Crestor [rosuvastatin Calcium]    Muscle aches    Grass Pollen([id number]-o-[id number]-t-swt Vern)    Lipitor [atorvastatin]    myalgiaas   Metformin And Related Diarrhea   Rabeprazole Sodium Nausea Only   REACTION: nausea        Medication List        Accurate as of March 28, 2023 11:08 AM. If you have any questions, ask your nurse or doctor.          Accu-Chek FastClix Lancet Kit AS DIRECTED   Accu-Chek FastClix Lancets Misc USE TWICE DAILY   albuterol 108 (90 Base) MCG/ACT inhaler Commonly known as: VENTOLIN HFA INHALE 2 PUFFS 4 TIMES DAILY AS NEEDED   amLODipine 10 MG tablet Commonly known as: NORVASC TAKE ONE TABLET EACH DAY   aspirin 81 MG chewable tablet Chew 1 tablet (81 mg total) by mouth daily.   bromocriptine 2.5 MG tablet Commonly known as: PARLODEL Take 0.5 tablets (1.25 mg total) by mouth daily.   carbamazepine 200 MG tablet Commonly known as: TEGRETOL Take 2 tablets (400 mg total) by mouth at bedtime. Annual appt due in August must see provider for future refills   clopidogrel 75 MG tablet Commonly known as: PLAVIX Take 1 tablet (75 mg total) by mouth daily.   cyanocobalamin 1000 MCG tablet Commonly known as: VITAMIN B12 Take 1,000 mcg by mouth daily.   dapagliflozin propanediol 10 MG Tabs tablet Commonly known as: Farxiga Take 1 tablet (10 mg total) by mouth daily before breakfast.   desoximetasone 0.25 % cream Commonly known as: TOPICORT Apply 1 application topically 2 (two) times daily. What changed:  when to take this reasons to take this   ezetimibe 10 MG tablet Commonly known as: ZETIA Take 1 tablet (10 mg total) by mouth daily.   Fish Oil 1200 MG Caps Take 1 capsule by mouth daily.   fluticasone 50 MCG/ACT nasal spray Commonly known as: FLONASE ONE SPRAY INTO NOSE EVERY DAY What changed: See the new  instructions.   GLUCOSAMINE PO Take 1 tablet by mouth daily.   glucose blood test strip Use to check blood sugar 2 times per day. DX Code: E11.9   glucose blood test strip Commonly known as: Accu-Chek Aviva Plus Used to check blood sugars twice a day Dx E11.9   Accu-Chek Guide test strip Generic drug: glucose blood CHECK BLOOD GLUCOSE (SUGAR) TWICE DAILY   hydrocortisone 2.5 % rectal cream Commonly known as: Procto-Med HC Place rectally 2 (two) times daily. Annual appt is due  must see provider for future refills   ketoconazole 2 %  cream Commonly known as: NIZORAL Apply topically daily.   levothyroxine 112 MCG tablet Commonly known as: SYNTHROID TAKE ONE TABLET EVERY DAY   losartan 50 MG tablet Commonly known as: COZAAR Take 1 tablet (50 mg total) by mouth daily. Annual appt w/labs are due must see provider for future refills   nateglinide 60 MG tablet Commonly known as: STARLIX TAKE 1/2 TABLET THREE TIMES DAILY WITH meals   pantoprazole 40 MG tablet Commonly known as: PROTONIX TAKE ONE TABLET DAILY   PRESERVISION AREDS 2 PO Take 1 tablet by mouth daily.   Repatha SureClick 140 MG/ML Soaj Generic drug: Evolocumab INJECT 140MG  INTO SKIN EVERY 14 DAYS   sitaGLIPtin 50 MG tablet Commonly known as: Januvia Take 1 tablet (50 mg total) by mouth daily.   Januvia 100 MG tablet Generic drug: sitaGLIPtin Take 50 mg by mouth daily.   tamsulosin 0.4 MG Caps capsule Commonly known as: FLOMAX TAKE ONE CAPSULE EACH DAY   TURMERIC CURCUMIN PO Take 500 mg by mouth daily.   Vitamin D3 50 MCG (2000 UT) Tabs Take 2,000 Units by mouth every morning.         OBJECTIVE:   Vital Signs: BP 136/70 (BP Location: Left Arm, Patient Position: Sitting, Cuff Size: Large)   Pulse 63   Ht 5\' 10"  (1.778 m)   Wt 250 lb (113.4 kg)   SpO2 95%   BMI 35.87 kg/m   Wt Readings from Last 3 Encounters:  03/28/23 250 lb (113.4 kg)  02/14/23 252 lb 6.4 oz (114.5 kg)  01/29/23 252  lb (114.3 kg)     Exam: General: Pt appears well and is in NAD  Lungs: Clear with good BS bilat   Heart: RRR   Extremities: No pretibial edema.   Neuro: MS is good with appropriate affect, pt is alert and Ox3    DM foot exam: 10/25/204  The skin of the feet is intact without sores or ulcerations. The pedal pulses are 1+ on right and 1+ on left. The sensation is decreased  to a screening 5.07, 10 gram monofilament on the left      DATA REVIEWED:  Lab Results  Component Value Date   HGBA1C 6.9 (H) 02/14/2023   HGBA1C 6.9 (A) 09/20/2022   HGBA1C 7.1 (A) 03/15/2022    Latest Reference Range & Units 02/14/23 14:39  Sodium 135 - 145 mEq/L 139  Potassium 3.5 - 5.1 mEq/L 4.6  Chloride 96 - 112 mEq/L 106  CO2 19 - 32 mEq/L 26  Glucose 70 - 99 mg/dL 409 (H)  BUN 6 - 23 mg/dL 36 (H)  Creatinine 8.11 - 1.50 mg/dL 9.14 (H)  Calcium 8.4 - 10.5 mg/dL 9.1  Alkaline Phosphatase 39 - 117 U/L 84  Albumin 3.5 - 5.2 g/dL 3.9  AST 0 - 37 U/L 20  ALT 0 - 53 U/L 22  Total Protein 6.0 - 8.3 g/dL 6.9  Bilirubin, Direct 0.0 - 0.3 mg/dL 0.0  Total Bilirubin 0.2 - 1.2 mg/dL 0.2  GFR >78.29 mL/min 33.48 (L)    Latest Reference Range & Units 02/14/23 14:39  Total CHOL/HDL Ratio  2  Cholesterol 0 - 200 mg/dL 562  HDL Cholesterol >13.08 mg/dL 65.78  Direct LDL mg/dL 46.9  MICROALB/CREAT RATIO 0.0 - 30.0 mg/g 64.9 (H)  Triglycerides 0.0 - 149.0 mg/dL 629.5 Triglyceride is over 400; calculations on Lipids are invalid. (H)     ASSESSMENT / PLAN / RECOMMENDATIONS:   1) Type 2 Diabetes Mellitus, Optimally controlled, With  neuropathic and CKD III complications - Most recent A1c of 6.9 %. Goal A1c <7.5%.    -A1c remains at goal without hypoglycemia - Limited glycemic agents due to CKD III - No changes at this time   MEDICATIONS: Continue Januvia 50 mg daily  Continue Farxiga 10 mg daily  Continue Bromocriptine 2.5 mg, half a tablet daily  Continue Starlix 60 mg, half a tablet 3 times  daily before every meal  EDUCATION / INSTRUCTIONS: BG monitoring instructions: Patient is instructed to check his blood sugars 1 times a day, fasting . Call [place] [facility] if: BG persistently < 70  I reviewed the Rule of 15 for the treatment of hypoglycemia in detail with the patient. Literature supplied.    2) Diabetic complications:  Eye: Does not have known diabetic retinopathy.  Neuro/ Feet: Does  have known diabetic peripheral neuropathy .  Renal: Patient does  have known baseline CKD. He   is  on an ACEI/ARB at present.      F/U in 6 months     Signed electronically by: Lyndle Herrlich, MD  [facility]  [facility] [address] [place] [place], [place] [address] Phone: [phone] FAX: [phone]   CC: Corwin Levins, MD [address] [place] [address] Phone: [phone]  Fax: [phone]  Return to Endocrinology clinic as below: Future Appointments  Date Time Provider Department Center  09/08/2023  9:45 AM [facility]-ANNUAL WELLNESS VISIT [facility] None  09/26/2023 10:10 AM Logann Whitebread, Konrad Dolores, MD [facility] None  02/16/2024  1:40 PM Corwin Levins, MD [facility] None

## 2023-03-28 NOTE — Patient Instructions (Signed)
Please take sitaGLIPtin (JANUVIA) 50 MG, 1  tablet daily  Continue Farxiga 10 mg daily Continue Bromocriptine 2.5 mg, half a tablet daily  Continue Starlix 60 mg , half a tablet before each meal     HOW TO TREAT LOW BLOOD SUGARS (Blood sugar LESS THAN 70 MG/DL) Please follow the RULE OF 15 for the treatment of hypoglycemia treatment (when your (blood sugars are less than 70 mg/dL)   STEP 1: Take 15 grams of carbohydrates when your blood sugar is low, which includes:  3-4 GLUCOSE TABS  OR 3-4 OZ OF JUICE OR REGULAR SODA OR ONE TUBE OF GLUCOSE GEL    STEP 2: RECHECK blood sugar in 15 MINUTES STEP 3: If your blood sugar is still low at the 15 minute recheck --> then, go back to STEP 1 and treat AGAIN with another 15 grams of carbohydrates.

## 2023-04-01 ENCOUNTER — Encounter: Payer: Self-pay | Admitting: Internal Medicine

## 2023-04-01 ENCOUNTER — Ambulatory Visit: Payer: Medicare Other | Admitting: Internal Medicine

## 2023-04-01 VITALS — BP 138/64 | HR 65 | Ht 70.5 in | Wt 250.0 lb

## 2023-04-01 DIAGNOSIS — G4733 Obstructive sleep apnea (adult) (pediatric): Secondary | ICD-10-CM

## 2023-04-01 DIAGNOSIS — J45998 Other asthma: Secondary | ICD-10-CM

## 2023-04-01 NOTE — Progress Notes (Signed)
HPI M former smoker followed for OSA, complicated by HTN, DM2, CVA,  Asthma, GERD, Hyperlipidemia, Hypothyroid, BPH, BiPOLAR, Obesity,  NPSG predates 2009, done ?[place]  =============================================  01/01/22- 80yoM former smoker coming to re-establish. Followed for OSA/ CPAP with remote NPSG Medical problem list includes HTN, DM2, CVA,  Asthma, GERD, Hyperlipidemia, Hypothyroid, BPH, BiPOLAR, Obesity,  NPSG predates 2009, done ?[place] Epworth score-247 lbs Body weight today- -----Consult: CPAP machine needs maintenance. Using CPAP every night. Machine is S9, set on 10 cwp/ [facility]. Needs replacement- at least 80 years old.  Otherwise stable. Some DOE if walks briskly. Not aware of any heart problems.  04/01/23- 81yoM former smoker (22 pkyrs) followed for OSA,  complicated by  HTN, DM2, CVA,  Asthma, GERD, Hyperlipidemia, Hypothyroid, BPH, BiPOLAR, Obesity,  NPSG predates 2009, done ?[place] - Ventolin hfa,  CPAP auto 10-20/ [facility] and [facility]  replaced 03/04/22 He fiddled with machine and extended Ramp. Asks to get this reduced. Download compliance-not available Body weight today-250 lbs Comfortable with CPAP. Reviewed. Using every night. Sleeps well. Still rides motorcycle.  ROS-see HPI Constitutional:   No-   weight loss, night sweats, fevers, chills, fatigue, lassitude. HEENT:   No-  headaches, difficulty swallowing, tooth/dental problems, sore throat,       No-  Sneezing,no- itching, ear ache, +nasal congestion, +post nasal drip,  CV:  No-   chest pain, orthopnea, PND, swelling in lower extremities, anasarca, dizziness, palpitations Resp: +-shortness of breath with exertion or at rest.              No-productive cough,  No non-productive cough,  No- coughing up of blood.              No-change in color of mucus.  No- wheezing.   Skin: No-   rash or lesions. GI:  No-   heartburn, indigestion, abdominal pain, nausea, vomiting,  GU: . MS:  No-    joint pain or swelling.  Neuro-     nothing unusual Psych:  No- change in mood or affect. No depression or anxiety.  No memory loss.   OBJ- General- Alert, Oriented, Affect-appropriate, Distress- none acute, mild hoarseness. + Obese Skin- rash-none, lesions- none, excoriation- none Lymphadenopathy- none Head- atraumatic            Eyes- Gross vision intact, PERRLA, conjunctivae clear secretions            Ears- +Hearing aids/ HOH            Nose- Clear, no-Septal dev, mucus, polyps, erosion, perforation             Throat- Mallampati IV , mucosa clear , drainage- none, tonsils present Neck- flexible , trachea midline, no stridor , thyroid nl, carotid no bruit Chest - symmetrical excursion , unlabored           Heart/CV- RRR , no murmur , no gallop  , no rub, nl s1 s2                           - JVD- none , edema- none, stasis changes- none, varices- none           Lung- clear to P&A, wheeze- none, cough-none , dullness-none, rub- none           Chest wall-  Abd-  Br/ Gen/ Rectal- Not done, not indicated Extrem- cyanosis- none, clubbing, none, atrophy- none, strength- nl Neuro- grossly intact to observation

## 2023-04-01 NOTE — Patient Instructions (Signed)
Order- [facility]- please shorten ramp, ensure auto 10-20, add download capability and provide download  Mr Pessin- you will contact [facility] for supplies.

## 2023-04-02 ENCOUNTER — Telehealth: Payer: Self-pay | Admitting: Internal Medicine

## 2023-04-02 DIAGNOSIS — G4733 Obstructive sleep apnea (adult) (pediatric): Secondary | ICD-10-CM

## 2023-04-02 NOTE — Telephone Encounter (Signed)
Patient states has CPAP machine. Needs pressure settings change. Patient uses [facility] for CPAP machine. Patient phone number is [phone].

## 2023-04-04 ENCOUNTER — Encounter: Payer: Self-pay | Admitting: Internal Medicine

## 2023-04-04 NOTE — Assessment & Plan Note (Signed)
Mild intermittent uncomplicated.  Doing fine with only occasional use of rescue inhaler, no maintenance

## 2023-04-04 NOTE — Assessment & Plan Note (Signed)
Benefits from CPAP Plan- shorten ramp, continue auto 10-20, add download capability

## 2023-04-07 NOTE — Telephone Encounter (Signed)
Obtaining compliance for Dr.Young to view, before calling pt.  Also based off lov notes sending cpap supplies to [facility]   Attempted to call [facility] for compliance, wait time 40 mins

## 2023-04-08 ENCOUNTER — Telehealth: Payer: Self-pay | Admitting: Internal Medicine

## 2023-04-08 NOTE — Telephone Encounter (Signed)
PT's wife calling stating Joseph Villa is still waiting for a Dr.'s signature.

## 2023-04-09 NOTE — Telephone Encounter (Signed)
Left message for the patient wife letting her know this was signed and faxed

## 2023-04-15 NOTE — Telephone Encounter (Signed)
The order was already send to [facility]. See phone note dated 04/08/23.

## 2023-04-18 ENCOUNTER — Other Ambulatory Visit: Payer: Self-pay

## 2023-04-18 MED ORDER — HYDROCORTISONE (PERIANAL) 2.5 % EX CREA: TOPICAL_CREAM | Freq: Two times a day (BID) | CUTANEOUS | 0 refills | Status: AC

## 2023-04-29 ENCOUNTER — Other Ambulatory Visit: Payer: Self-pay | Admitting: Internal Medicine

## 2023-04-29 DIAGNOSIS — E1122 Type 2 diabetes mellitus with diabetic chronic kidney disease: Secondary | ICD-10-CM

## 2023-07-09 ENCOUNTER — Other Ambulatory Visit: Payer: Self-pay | Admitting: Internal Medicine

## 2023-07-09 ENCOUNTER — Other Ambulatory Visit: Payer: Self-pay

## 2023-08-19 ENCOUNTER — Other Ambulatory Visit: Payer: Self-pay

## 2023-08-19 ENCOUNTER — Other Ambulatory Visit: Payer: Self-pay | Admitting: Internal Medicine

## 2023-09-26 ENCOUNTER — Ambulatory Visit: Payer: Medicare Other | Admitting: Internal Medicine

## 2023-10-01 ENCOUNTER — Encounter: Payer: Self-pay | Admitting: Internal Medicine

## 2023-10-01 ENCOUNTER — Ambulatory Visit: Payer: Medicare Other | Admitting: Internal Medicine

## 2023-10-01 VITALS — BP 120/70 | HR 72 | Ht 70.5 in

## 2023-10-01 DIAGNOSIS — Z7984 Long term (current) use of oral hypoglycemic drugs: Secondary | ICD-10-CM | POA: Diagnosis not present

## 2023-10-01 DIAGNOSIS — E1142 Type 2 diabetes mellitus with diabetic polyneuropathy: Secondary | ICD-10-CM | POA: Diagnosis not present

## 2023-10-01 DIAGNOSIS — N1832 Chronic kidney disease, stage 3b: Secondary | ICD-10-CM

## 2023-10-01 DIAGNOSIS — E1122 Type 2 diabetes mellitus with diabetic chronic kidney disease: Secondary | ICD-10-CM | POA: Diagnosis not present

## 2023-10-01 LAB — POCT GLUCOSE (DEVICE FOR HOME USE): POC Glucose: 192 mg/dL — AB (ref 70–99)

## 2023-10-01 LAB — POCT GLYCOSYLATED HEMOGLOBIN (HGB A1C): Hemoglobin A1C: 6.9 % — AB (ref 4.0–5.6)

## 2023-10-01 MED ORDER — BROMOCRIPTINE MESYLATE 2.5 MG PO TABS: 1.2500 mg | ORAL_TABLET | Freq: Every day | ORAL | 3 refills | Status: AC

## 2023-10-01 MED ORDER — DAPAGLIFLOZIN PROPANEDIOL 10 MG PO TABS: 10.0000 mg | ORAL_TABLET | Freq: Every day | ORAL | 3 refills | Status: AC

## 2023-10-01 MED ORDER — NATEGLINIDE 60 MG PO TABS: ORAL_TABLET | ORAL | 3 refills | Status: DC

## 2023-10-01 MED ORDER — SITAGLIPTIN PHOSPHATE 50 MG PO TABS: 50.0000 mg | ORAL_TABLET | Freq: Every day | ORAL | 3 refills | Status: DC

## 2023-10-01 NOTE — Patient Instructions (Signed)
Please take sitaGLIPtin (JANUVIA) 50 MG, 1  tablet daily  Continue Farxiga 10 mg daily Continue Bromocriptine 2.5 mg, half a tablet daily  Continue Starlix 60 mg , half a tablet before each meal     HOW TO TREAT LOW BLOOD SUGARS (Blood sugar LESS THAN 70 MG/DL) Please follow the RULE OF 15 for the treatment of hypoglycemia treatment (when your (blood sugars are less than 70 mg/dL)   STEP 1: Take 15 grams of carbohydrates when your blood sugar is low, which includes:  3-4 GLUCOSE TABS  OR 3-4 OZ OF JUICE OR REGULAR SODA OR ONE TUBE OF GLUCOSE GEL    STEP 2: RECHECK blood sugar in 15 MINUTES STEP 3: If your blood sugar is still low at the 15 minute recheck --> then, go back to STEP 1 and treat AGAIN with another 15 grams of carbohydrates.

## 2023-10-01 NOTE — Progress Notes (Signed)
 Name: Joseph Villa  Age/ Sex: 83 y.o., male   MRN/ DOB: [id number], 05-20-1941     PCP: Roslyn Coombe, MD   Reason for Endocrinology Evaluation: Type 2 Diabetes Mellitus  Initial Endocrine Consultative Visit: 02/14/2015    PATIENT IDENTIFIER: Joseph Villa is a 83 y.o. male with a past medical history of Dm, dyslipidemia . The patient has followed with Endocrinology clinic since 02/14/2015 for consultative assistance with management of his diabetes.  DIABETIC HISTORY:  Joseph Villa was diagnosed with DM 2007. His hemoglobin A1c has ranged from 5.7% in 2018, peaking at 7.9% in 2019.  He was followed by Dr. Washington Hacker from 2016 until March 2023   SUBJECTIVE:   During the last visit (03/28/2023): A1c 6.9%  Today (10/01/2023): Joseph Villa is here for follow-up on diabetes management.  He checks his blood sugars 1 times daily. The patient has not had hypoglycemic episodes since the last clinic visit   Patient continues to follow-up with cardiology for dyslipidemia Patient follows with pulmonary for OSA, on CPAP  Denies nausea or vomiting  Denies constipation or diarrhea    HOME DIABETES REGIMEN:  Januvia  50, 1 tablet  tablet daily Farxiga  10 mg daily Bromocriptine  2.5 mg, half a tablet daily  Starlix  60 mg , half a tablet TIDQAC   Statin: Patient on Repatha  ACE-I/ARB: Yes    METER DOWNLOAD SUMMARY: unable to download    DIABETIC COMPLICATIONS: Microvascular complications:  CKD III, neuropathy Denies:  Last Eye Exam: Completed 06/2022  Macrovascular complications:   Denies: CAD, CVA, PVD   HISTORY:  Past Medical History:  Past Medical History:  Diagnosis Date   ALLERGIC ASTHMA 11/25/2007   ALLERGIC RHINITIS 11/25/2007   Allergy     Arthritis, lumbar spine 04/18/2011   BIPOLAR DISORDER UNSPECIFIED 10/06/2009   Blood transfusion without reported diagnosis    s/p goiter surgery age 28    COLONIC POLYPS, HX OF 10/06/2009   Degenerative arthritis of hip  04/18/2011   DJD (degenerative joint disease)    DM w/o Complication Type II 05/10/2008   GERD 10/06/2009   Hemorrhage yrs ago   after goiter surgery, tracheostomy inserted and later removed   Hemorrhoid    History of kidney stones    HYPERLIPIDEMIA 10/06/2009   HYPERTENSION, BENIGN 07/27/2009   HYPOTHYROIDISM 10/06/2009   NEPHROLITHIASIS, HX OF 10/06/2009   OBSTRUCTIVE SLEEP APNEA 11/25/2007   cpap setting of 9   Peptic stricture of esophagus    RBBB (right bundle branch block)    Sleep apnea    wears c pap   Stroke [facility])    Past Surgical History:  Past Surgical History:  Procedure Laterality Date   COLONOSCOPY     GANGLION CYST EXCISION     2-3 cysts removed   HERNIA REPAIR     JOINT REPLACEMENT     thyroid  Goiter surgery     TONSILLECTOMY     TONSILLECTOMY     TOTAL HIP ARTHROPLASTY  06/16/2012   Procedure: TOTAL HIP ARTHROPLASTY ANTERIOR APPROACH;  Surgeon: Bevin Bucks, MD;  Location: [facility];  Service: Orthopedics;  Laterality: Left;   TRANSCAROTID ARTERY REVASCULARIZATION  Left 11/20/2020   Procedure: LEFT TRANSCAROTID ARTERY REVASCULARIZATION;  Surgeon: Young Hensen, MD;  Location: [facility] OR;  Service: Vascular;  Laterality: Left;   UMBILICAL HERNIA REPAIR  several yrs ago   UPPER GASTROINTESTINAL ENDOSCOPY     Social History:  reports that he quit smoking about 46 years ago. His smoking use  included cigarettes and pipe. He started smoking about 61 years ago. He has a 22.5 pack-year smoking history. He has never used smokeless tobacco. He reports that he does not drink alcohol and does not use drugs. Family History:  Family History  Problem Relation Age of Onset   Throat cancer Mother    Alcohol abuse Brother    Diabetes Brother    Drug abuse Daughter    Alcoholism Other        uncle   Breast cancer Father    Colon cancer Neg Hx    Colon polyps Neg Hx    Esophageal cancer Neg Hx    Rectal cancer Neg Hx    Stomach cancer Neg Hx      HOME  MEDICATIONS: Allergies as of 10/01/2023       Reactions   Cefuroxime Axetil Anaphylaxis   REACTION: anaphylaxis-ceftin   Cefprozil    REACTION: unknown   Cephalosporins Nausea Only   Crestor  [rosuvastatin  Calcium ]    Muscle aches    Grass Pollen(k-o-r-t-swt Vern)    Lipitor [atorvastatin ]    myalgiaas   Metformin  And Related Diarrhea   Rabeprazole Sodium Nausea Only   REACTION: nausea        Medication List        Accurate as of October 01, 2023 11:15 AM. If you have any questions, ask your nurse or doctor.          Accu-Chek FastClix Lancet Kit AS DIRECTED   Accu-Chek FastClix Lancets Misc USE TWICE DAILY   albuterol  108 (90 Base) MCG/ACT inhaler Commonly known as: VENTOLIN  HFA INHALE 2 PUFFS 4 TIMES DAILY AS NEEDED   amLODipine  10 MG tablet Commonly known as: NORVASC  TAKE ONE TABLET EACH DAY   aspirin  81 MG chewable tablet Chew 1 tablet (81 mg total) by mouth daily.   bromocriptine  2.5 MG tablet Commonly known as: PARLODEL  TAKE 1/2 TABLET DAILY   carbamazepine  200 MG tablet Commonly known as: TEGRETOL  TAKE TWO TABLETS BY MOUTH AT BEDTIME   clopidogrel  75 MG tablet Commonly known as: PLAVIX  Take 1 tablet (75 mg total) by mouth daily.   cyanocobalamin  1000 MCG tablet Commonly known as: VITAMIN B12 Take 1,000 mcg by mouth daily.   dapagliflozin  propanediol 10 MG Tabs tablet Commonly known as: Farxiga  Take 1 tablet (10 mg total) by mouth daily before breakfast.   desoximetasone  0.25 % cream Commonly known as: TOPICORT  Apply 1 application topically 2 (two) times daily. What changed:  when to take this reasons to take this   ezetimibe  10 MG tablet Commonly known as: ZETIA  Take 1 tablet (10 mg total) by mouth daily.   Fish Oil 1200 MG Caps Take 1 capsule by mouth daily.   fluticasone  50 MCG/ACT nasal spray Commonly known as: FLONASE  ONE SPRAY INTO NOSE EVERY DAY What changed: See the new instructions.   GLUCOSAMINE PO Take 1 tablet by  mouth daily.   glucose blood test strip Use to check blood sugar 2 times per day. DX Code: E11.9   glucose blood test strip Commonly known as: Accu-Chek Aviva Plus Used to check blood sugars twice a day Dx E11.9   Accu-Chek Guide test strip Generic drug: glucose blood CHECK BLOOD GLUCOSE (SUGAR) TWICE DAILY   hydrocortisone  2.5 % rectal cream Commonly known as: Procto-Med HC  Place rectally 2 (two) times daily. Annual appt is due  must see provider for future refills   ketoconazole  2 % cream Commonly known as: NIZORAL  Apply topically daily.  levothyroxine  112 MCG tablet Commonly known as: SYNTHROID  TAKE ONE TABLET EVERY DAY   losartan  50 MG tablet Commonly known as: COZAAR  Take 1 tablet (50 mg total) by mouth daily. Annual appt w/labs are due must see provider for future refills   nateglinide  60 MG tablet Commonly known as: STARLIX  TAKE 1/2 TABLET THREE TIMES DAILY WITH meals   pantoprazole  40 MG tablet Commonly known as: PROTONIX  TAKE ONE TABLET DAILY   PRESERVISION AREDS 2 PO Take 1 tablet by mouth daily.   Repatha  SureClick 140 MG/ML Soaj Generic drug: Evolocumab  INJECT 140MG  INTO SKIN EVERY 14 DAYS   sitaGLIPtin  50 MG tablet Commonly known as: Januvia  Take 1 tablet (50 mg total) by mouth daily.   Januvia  100 MG tablet Generic drug: sitaGLIPtin  TAKE ONE-HALF TABLET DAILY   tamsulosin  0.4 MG Caps capsule Commonly known as: FLOMAX  TAKE ONE CAPSULE EACH DAY   TURMERIC CURCUMIN PO Take 500 mg by mouth daily.   Vitamin D3 50 MCG (2000 UT) Tabs Take 2,000 Units by mouth every morning.         OBJECTIVE:   Vital Signs: BP 120/70 (BP Location: Left Arm, Patient Position: Sitting, Cuff Size: Normal)   Pulse 72   Ht 5' 10.5" (1.791 m)   SpO2 95%   BMI 35.36 kg/m   Wt Readings from Last 3 Encounters:  04/01/23 250 lb (113.4 kg)  03/28/23 250 lb (113.4 kg)  02/14/23 252 lb 6.4 oz (114.5 kg)     Exam: General: Pt appears well and is in NAD   Lungs: Clear with good BS bilat   Heart: RRR   Extremities: No pretibial edema.   Neuro: MS is good with appropriate affect, pt is alert and Ox3    DM foot exam: 03/28/2023  The skin of the feet is intact without sores or ulcerations. The pedal pulses are 1+ on right and 1+ on left. The sensation is decreased  to a screening 5.07, 10 gram monofilament on the left      DATA REVIEWED:  Lab Results  Component Value Date   HGBA1C 6.9 (A) 10/01/2023   HGBA1C 6.9 (H) 02/14/2023   HGBA1C 6.9 (A) 09/20/2022    Latest Reference Range & Units 02/14/23 14:39  Sodium 135 - 145 mEq/L 139  Potassium 3.5 - 5.1 mEq/L 4.6  Chloride 96 - 112 mEq/L 106  CO2 19 - 32 mEq/L 26  Glucose 70 - 99 mg/dL 295 (H)  BUN 6 - 23 mg/dL 36 (H)  Creatinine 6.21 - 1.50 mg/dL 3.08 (H)  Calcium  8.4 - 10.5 mg/dL 9.1  Alkaline Phosphatase 39 - 117 U/L 84  Albumin  3.5 - 5.2 g/dL 3.9  AST 0 - 37 U/L 20  ALT 0 - 53 U/L 22  Total Protein 6.0 - 8.3 g/dL 6.9  Bilirubin, Direct 0.0 - 0.3 mg/dL 0.0  Total Bilirubin 0.2 - 1.2 mg/dL 0.2  GFR >65.78 mL/min 33.48 (L)    Latest Reference Range & Units 02/14/23 14:39  Total CHOL/HDL Ratio  2  Cholesterol 0 - 200 mg/dL 469  HDL Cholesterol >62.95 mg/dL 28.41  Direct LDL mg/dL 32.4  MICROALB/CREAT RATIO 0.0 - 30.0 mg/g 64.9 (H)  Triglycerides 0.0 - 149.0 mg/dL 401.0 Triglyceride is over 400; calculations on Lipids are invalid. (H)     ASSESSMENT / PLAN / RECOMMENDATIONS:   1) Type 2 Diabetes Mellitus, Optimally controlled, With neuropathic and CKD III complications - Most recent A1c of 6.9 %. Goal A1c <7.5%.    -A1c  remains at goal without hypoglycemia - Limited glycemic agents due to CKD III - No changes at this time - Patient had a postprandial BG of 192 Mg/DL, we did discuss options of incorporating protein with breakfast daily, for example today at 3 waffles with a sugar-free syrup   MEDICATIONS: Continue Januvia  50 mg daily  Continue Farxiga  10 mg  daily  Continue Bromocriptine  2.5 mg, half a tablet daily  Continue Starlix  60 mg, half a tablet 3 times daily before every meal  EDUCATION / INSTRUCTIONS: BG monitoring instructions: Patient is instructed to check his blood sugars 1 times a day, fasting . Call [facility] if: BG persistently < 70  I reviewed the Rule of 15 for the treatment of hypoglycemia in detail with the patient. Literature supplied.    2) Diabetic complications:  Eye: Does not have known diabetic retinopathy.  Neuro/ Feet: Does  have known diabetic peripheral neuropathy .  Renal: Patient does  have known baseline CKD. He   is  on an ACEI/ARB at present.      F/U in 6 months     Signed electronically by: Natale Bail, MD  [facility] Endocr[facility]ology  [facility] [address] [place], [place] [address] Phone: [phone] FAX: [phone]   CC: Roslyn Coombe, MD [address] [place] [address] Phone: [phone]  Fax: [phone]  Return to Endocrinology clinic as below: Future Appointments  Date Time Provider Department Center  11/25/2023 11:30 AM [facility] 2 [facility]  11/25/2023 12:45 PM [facility] PA-2 [facility]  02/16/2024  1:40 PM Roslyn Coombe, MD [facility] None

## 2023-10-02 ENCOUNTER — Other Ambulatory Visit: Payer: Self-pay | Admitting: Internal Medicine

## 2023-10-02 ENCOUNTER — Other Ambulatory Visit: Payer: Self-pay

## 2023-10-24 DIAGNOSIS — H353132 Nonexudative age-related macular degeneration, bilateral, intermediate dry stage: Secondary | ICD-10-CM | POA: Diagnosis not present

## 2023-10-24 DIAGNOSIS — H52203 Unspecified astigmatism, bilateral: Secondary | ICD-10-CM | POA: Diagnosis not present

## 2023-10-24 DIAGNOSIS — H2513 Age-related nuclear cataract, bilateral: Secondary | ICD-10-CM | POA: Diagnosis not present

## 2023-10-24 LAB — HM DIABETES EYE EXAM

## 2023-11-04 ENCOUNTER — Other Ambulatory Visit: Payer: Self-pay | Admitting: *Deleted

## 2023-11-04 DIAGNOSIS — I6523 Occlusion and stenosis of bilateral carotid arteries: Secondary | ICD-10-CM

## 2023-11-17 DIAGNOSIS — Z85828 Personal history of other malignant neoplasm of skin: Secondary | ICD-10-CM | POA: Diagnosis not present

## 2023-11-17 DIAGNOSIS — L820 Inflamed seborrheic keratosis: Secondary | ICD-10-CM | POA: Diagnosis not present

## 2023-11-17 DIAGNOSIS — L309 Dermatitis, unspecified: Secondary | ICD-10-CM | POA: Diagnosis not present

## 2023-11-25 ENCOUNTER — Encounter (HOSPITAL_COMMUNITY)

## 2023-11-25 ENCOUNTER — Ambulatory Visit

## 2023-12-01 ENCOUNTER — Ambulatory Visit

## 2023-12-02 ENCOUNTER — Other Ambulatory Visit: Payer: Self-pay

## 2023-12-02 ENCOUNTER — Other Ambulatory Visit: Payer: Self-pay | Admitting: Internal Medicine

## 2024-01-27 ENCOUNTER — Ambulatory Visit: Attending: Vascular Surgery | Admitting: Physician Assistant

## 2024-01-27 ENCOUNTER — Ambulatory Visit (HOSPITAL_COMMUNITY)
Admission: RE | Admit: 2024-01-27 | Discharge: 2024-01-27 | Disposition: A | Discharge status: Home / Self Care | Source: Ambulatory Visit | Attending: Vascular Surgery | Admitting: Vascular Surgery

## 2024-01-27 VITALS — BP 156/75 | HR 69 | Temp 97.4°F | Resp 15 | Ht 71.0 in | Wt 251.6 lb

## 2024-01-27 DIAGNOSIS — I6523 Occlusion and stenosis of bilateral carotid arteries: Secondary | ICD-10-CM | POA: Diagnosis not present

## 2024-01-27 DIAGNOSIS — I6522 Occlusion and stenosis of left carotid artery: Secondary | ICD-10-CM | POA: Diagnosis not present

## 2024-01-27 NOTE — Progress Notes (Signed)
 Office Note     CC:  follow up Requesting Provider:  Norleen Lynwood ORN, MD  HPI: Joseph Villa is a 83 y.o. (01-29-1941) male who presents for surveillance follow up of carotid artery stenosis. He has a history of left TCAR on 11/20/2020 by Dr. Gretta. This was done for symptomatic left ICA stenosis with history of amaurosis fugax.   Today he says overall he has been feeling good. He did have a fall last Saturday while at a baby shower. He explains that he tripped over an uneven doorway. His left side got pretty bruised up. Otherwise he denies any visual changes, slurred speech, facial drooping, unilateral upper or lower extremity weakness or numbness. No pain in his legs on ambulation or rest. No tissue loss. He is medically managed on Aspirin , Plavix  and Repatha . Has intolerance to statins due to myalgias.   Patient tells me about his Cockatoo named Pensions consultant. He has had him for over 20 years and he explains that he will likely outlive him.   Past Medical History:  Diagnosis Date   ALLERGIC ASTHMA 11/25/2007   ALLERGIC RHINITIS 11/25/2007   Allergy     Arthritis, lumbar spine 04/18/2011   BIPOLAR DISORDER UNSPECIFIED 10/06/2009   Blood transfusion without reported diagnosis    s/p goiter surgery age 65    COLONIC POLYPS, HX OF 10/06/2009   Degenerative arthritis of hip 04/18/2011   DJD (degenerative joint disease)    DM w/o Complication Type II 05/10/2008   GERD 10/06/2009   Hemorrhage yrs ago   after goiter surgery, tracheostomy inserted and later removed   Hemorrhoid    History of kidney stones    HYPERLIPIDEMIA 10/06/2009   HYPERTENSION, BENIGN 07/27/2009   HYPOTHYROIDISM 10/06/2009   NEPHROLITHIASIS, HX OF 10/06/2009   OBSTRUCTIVE SLEEP APNEA 11/25/2007   cpap setting of 9   Peptic stricture of esophagus    RBBB (right bundle branch block)    Sleep apnea    wears c pap   Stroke [facility])     Past Surgical History:  Procedure Laterality Date   COLONOSCOPY     GANGLION CYST  EXCISION     2-3 cysts removed   HERNIA REPAIR     JOINT REPLACEMENT     thyroid  Goiter surgery     TONSILLECTOMY     TONSILLECTOMY     TOTAL HIP ARTHROPLASTY  06/16/2012   Procedure: TOTAL HIP ARTHROPLASTY ANTERIOR APPROACH;  Surgeon: Donnice JONETTA Car, MD;  Location: [facility];  Service: Orthopedics;  Laterality: Left;   TRANSCAROTID ARTERY REVASCULARIZATION  Left 11/20/2020   Procedure: LEFT TRANSCAROTID ARTERY REVASCULARIZATION;  Surgeon: Gretta Lonni PARAS, MD;  Location: [facility] OR;  Service: Vascular;  Laterality: Left;   UMBILICAL HERNIA REPAIR  several yrs ago   UPPER GASTROINTESTINAL ENDOSCOPY      Social History   Socioeconomic History   Marital status: Married    Spouse name: Niels   Number of children: 3   Years of education: Not on file   Highest education level: Not on file  Occupational History   Occupation: retired  Tobacco Use   Smoking status: Former    Current packs/day: 0.00    Average packs/day: 1.5 packs/day for 15.0 years (22.5 ttl pk-yrs)    Types: Cigarettes, Pipe    Start date: 06/23/1962    Quit date: 06/23/1977    Years since quitting: 46.6   Smokeless tobacco: Never  Vaping Use   Vaping status: Never Used  Substance and Sexual Activity  Alcohol use: No    Alcohol/week: 0.0 standard drinks of alcohol   Drug use: No   Sexual activity: Not Currently  Other Topics Concern   Not on file  Social History Narrative   POA: Aniket Paye, wife   Social Drivers of Health   Financial Resource Strain: Low Risk  (09/05/2022)   Overall Financial Resource Strain (CARDIA)    Difficulty of Paying Living Expenses: Not hard at all  Food Insecurity: No Food Insecurity (09/05/2022)   Hunger Vital Sign    Worried About Running Out of Food in the Last Year: Never true    Ran Out of Food in the Last Year: Never true  Transportation Needs: No Transportation Needs (09/05/2022)   PRAPARE - Administrator, Civil Service (Medical): No    Lack of Transportation  (Non-Medical): No  Physical Activity: Insufficiently Active (09/05/2022)   Exercise Vital Sign    Days of Exercise per Week: 7 days    Minutes of Exercise per Session: 20 min  Stress: No Stress Concern Present (09/05/2022)   [employer] of Occupational Health - Occupational Stress Questionnaire    Feeling of Stress : Not at all  Social Connections: Unknown (09/05/2022)   Social Connection and Isolation Panel    Frequency of Communication with Friends and Family: Patient unable to answer    Frequency of Social Gatherings with Friends and Family: Patient unable to answer    Attends Religious Services: Never    Database administrator or Organizations: No    Attends Banker Meetings: Never    Marital Status: Married  Catering manager Violence: Not At Risk (09/05/2022)   Humiliation, Afraid, Rape, and Kick questionnaire    Fear of Current or Ex-Partner: No    Emotionally Abused: No    Physically Abused: No    Sexually Abused: No    Family History  Problem Relation Age of Onset   Throat cancer Mother    Alcohol abuse Brother    Diabetes Brother    Drug abuse Daughter    Alcoholism Other        uncle   Breast cancer Father    Colon cancer Neg Hx    Colon polyps Neg Hx    Esophageal cancer Neg Hx    Rectal cancer Neg Hx    Stomach cancer Neg Hx     Current Outpatient Medications  Medication Sig Dispense Refill   Accu-Chek FastClix Lancets MISC USE TWICE DAILY 102 each 2   ACCU-CHEK GUIDE test strip CHECK BLOOD GLUCOSE (SUGAR) TWICE DAILY 200 strip 3   albuterol  (VENTOLIN  HFA) 108 (90 Base) MCG/ACT inhaler INHALE 2 PUFFS 4 TIMES DAILY AS NEEDED 8.5 g 0   amLODipine  (NORVASC ) 10 MG tablet TAKE ONE TABLET EACH DAY 90 tablet 3   aspirin  81 MG chewable tablet Chew 1 tablet (81 mg total) by mouth daily. 90 tablet 3   bromocriptine  (PARLODEL ) 2.5 MG tablet Take 0.5 tablets (1.25 mg total) by mouth daily. 45 tablet 3   carbamazepine  (TEGRETOL ) 200 MG tablet TAKE TWO TABLETS  BY MOUTH AT BEDTIME 180 tablet 1   Cholecalciferol  (VITAMIN D3) 2000 UNITS TABS Take 2,000 Units by mouth every morning.      clopidogrel  (PLAVIX ) 75 MG tablet Take 1 tablet (75 mg total) by mouth daily. 90 tablet 3   dapagliflozin  propanediol (FARXIGA ) 10 MG TABS tablet Take 1 tablet (10 mg total) by mouth daily before breakfast. 90 tablet 3   desoximetasone  (  TOPICORT ) 0.25 % cream Apply 1 application topically 2 (two) times daily. (Patient taking differently: Apply 1 application  topically 2 (two) times daily as needed (psoriasis).) 100 g 0   Evolocumab  (REPATHA  SURECLICK) 140 MG/ML SOAJ INJECT 140MG  INTO SKIN EVERY 14 DAYS 2 mL 11   ezetimibe  (ZETIA ) 10 MG tablet Take 1 tablet (10 mg total) by mouth daily. 90 tablet 3   fluticasone  (FLONASE ) 50 MCG/ACT nasal spray ONE SPRAY INTO NOSE EVERY DAY (Patient taking differently: Place 2 sprays into both nostrils daily as needed for rhinitis or allergies.) 48 g 1   Glucosamine HCl (GLUCOSAMINE PO) Take 1 tablet by mouth daily.     glucose blood (ACCU-CHEK AVIVA PLUS) test strip Used to check blood sugars twice a day Dx E11.9 200 each 0   glucose blood test strip Use to check blood sugar 2 times per day. DX Code: E11.9 200 each 2   hydrocortisone  (PROCTO-MED HC ) 2.5 % rectal cream Place rectally 2 (two) times daily. Annual appt is due  must see provider for future refills 30 g 0   ketoconazole  (NIZORAL ) 2 % cream Apply topically daily.     Lancets Misc. (ACCU-CHEK FASTCLIX LANCET) KIT AS DIRECTED 1 kit 0   levothyroxine  (SYNTHROID ) 112 MCG tablet TAKE ONE TABLET EVERY DAY 90 tablet 2   losartan  (COZAAR ) 50 MG tablet Take 1 tablet (50 mg total) by mouth daily. Annual appt w/labs are due must see provider for future refills 90 tablet 3   Multiple Vitamins-Minerals (PRESERVISION AREDS 2 PO) Take 1 tablet by mouth daily.     nateglinide  (STARLIX ) 60 MG tablet TAKE 1/2 TABLET THREE TIMES DAILY WITH meals 135 tablet 3   Omega-3 Fatty Acids (FISH OIL) 1200 MG  CAPS Take 1 capsule by mouth daily.     pantoprazole  (PROTONIX ) 40 MG tablet TAKE ONE TABLET DAILY 90 tablet 2   sitaGLIPtin  (JANUVIA ) 50 MG tablet Take 1 tablet (50 mg total) by mouth daily. 90 tablet 3   tamsulosin  (FLOMAX ) 0.4 MG CAPS capsule TAKE ONE CAPSULE EACH DAY 90 capsule 3   TURMERIC CURCUMIN PO Take 500 mg by mouth daily.     vitamin B-12 (CYANOCOBALAMIN ) 1000 MCG tablet Take 1,000 mcg by mouth daily.     No current facility-administered medications for this visit.    Allergies  Allergen Reactions   Cefuroxime Axetil Anaphylaxis    REACTION: anaphylaxis-ceftin    Cefprozil     REACTION: unknown   Cephalosporins Nausea Only   Crestor  [Rosuvastatin  Calcium ]     Muscle aches    Grass Pollen(K-O-R-T-Swt Vern)    Lipitor [Atorvastatin ]     myalgiaas   Metformin  And Related Diarrhea   Rabeprazole Sodium Nausea Only    REACTION: nausea     REVIEW OF SYSTEMS:  Negative unless noted in HPI [X]  denotes positive finding, [ ]  denotes negative finding Cardiac  Comments:  Chest pain or chest pressure:    Shortness of breath upon exertion:    Short of breath when lying flat:    Irregular heart rhythm:        Vascular    Pain in calf, thigh, or hip brought on by ambulation:    Pain in feet at night that wakes you up from your sleep:     Blood clot in your veins:    Leg swelling:         Pulmonary    Oxygen  at home:    Productive cough:  Wheezing:         Neurologic    Sudden weakness in arms or legs:     Sudden numbness in arms or legs:     Sudden onset of difficulty speaking or slurred speech:    Temporary loss of vision in one eye:     Problems with dizziness:         Gastrointestinal    Blood in stool:     Vomited blood:         Genitourinary    Burning when urinating:     Blood in urine:        Psychiatric    Major depression:         Hematologic    Bleeding problems:    Problems with blood clotting too easily:        Skin    Rashes or  ulcers:        Constitutional    Fever or chills:      PHYSICAL EXAMINATION:  Vitals:   01/27/24 1228 01/27/24 1230  BP: (!) 163/75 (!) 156/75  Pulse: 69   Resp: 15   Temp: (!) 97.4 F (36.3 C)   TempSrc: Temporal   Weight: 251 lb 9.6 oz (114.1 kg)   Height: 5' 11 (1.803 m)     General:  WDWN in NAD; vital signs documented above Gait: Normal HENT: WNL, normocephalic; hard of hearing Pulmonary: normal non-labored breathing Cardiac: regular HR Abdomen: soft Vascular Exam/Pulses: 2+ radial, 2+ femoral and DP pulses bilaterally Extremities: without ischemic changes, without Gangrene , without cellulitis; without open wounds;  Musculoskeletal: no muscle wasting or atrophy  Neurologic: A&O X 3 Psychiatric:  The pt has Normal affect.   Non-Invasive Vascular Imaging:   VAS US  Carotid duplex: Summary:  Right Carotid: Velocities in the right ICA are consistent with a 60-79% stenosis.   Left Carotid: There was no evidence of thrombus, dissection, atherosclerotic plaque or stenosis in the cervical carotid system. Patent ICA stent  Vertebrals:  Bilateral vertebral arteries demonstrate antegrade flow.  Subclavians: Left subclavian artery was stenotic. Normal flow hemodynamics were seen in the right subclavian artery.    ASSESSMENT/PLAN:: 83 y.o. male presents for surveillance follow up of carotid artery stenosis. He has a history of left TCAR on 11/20/2020 by Dr. Gretta. This was done for symptomatic left ICA stenosis with history of amaurosis fugax. He is without any new TIA or stroke like symptoms. His duplex today shows patient left ICA stent. The right ICA velocities have increased slightly from prior duplex so he is now in the 60-79% range.  - continue Aspirin , Plavix  and Repatha  - reviewed signs and symptoms of stroke/ TIA and he understands should this occur to seek immediate medical attention - Follow up in 6 months with carotid duplex   Teretha Damme, PA-C [facility] [phone]  Clinic MD:   Tomie Gretta

## 2024-01-28 ENCOUNTER — Other Ambulatory Visit: Payer: Self-pay

## 2024-01-28 DIAGNOSIS — I6523 Occlusion and stenosis of bilateral carotid arteries: Secondary | ICD-10-CM

## 2024-01-31 ENCOUNTER — Other Ambulatory Visit: Payer: Self-pay | Admitting: Internal Medicine

## 2024-02-13 ENCOUNTER — Other Ambulatory Visit: Payer: Self-pay | Admitting: Internal Medicine

## 2024-02-16 ENCOUNTER — Encounter: Payer: Medicare Other | Admitting: Internal Medicine

## 2024-02-16 ENCOUNTER — Ambulatory Visit (INDEPENDENT_AMBULATORY_CARE_PROVIDER_SITE_OTHER)

## 2024-02-16 VITALS — BP 158/60 | HR 71 | Ht 70.0 in | Wt 252.4 lb

## 2024-02-16 DIAGNOSIS — Z0000 Encounter for general adult medical examination without abnormal findings: Secondary | ICD-10-CM | POA: Diagnosis not present

## 2024-02-16 NOTE — Patient Instructions (Addendum)
 [name],  Thank you for taking the time for your Medicare Wellness Visit. I appreciate your continued commitment to your health goals. Please review the care plan we discussed, and feel free to reach out if I can assist you further.  Medicare recommends these wellness visits once per year to help you and your care team stay ahead of potential health issues. These visits are designed to focus on prevention, allowing your provider to concentrate on managing your acute and chronic conditions during your regular appointments.  Please note that Annual Wellness Visits do not include a physical exam. Some assessments may be limited, especially if the visit was conducted virtually. If needed, we may recommend a separate in-person follow-up with your provider.  Ongoing Care Seeing your primary care provider every 3 to 6 months helps us  monitor your health and provide consistent, personalized care.   Referrals If a referral was made during today's visit and you haven't received any updates within two weeks, please contact the referred provider directly to check on the status.  Recommended Screenings:  Health Maintenance  Topic Date Due   Yearly kidney health urinalysis for diabetes  Never done   Zoster (Shingles) Vaccine (1 of 2) Never done   Flu Shot  01/02/2024   Yearly kidney function blood test for diabetes  02/14/2024   Complete foot exam   02/14/2024   Colon Cancer Screening  02/15/2025*   Hemoglobin A1C  04/01/2024   Eye exam for diabetics  10/23/2024   Medicare Annual Wellness Visit  02/15/2025   Pneumococcal Vaccine for age over 76  Completed   HPV Vaccine  Aged Out   Meningitis B Vaccine  Aged Out   DTaP/Tdap/Td vaccine  Discontinued   COVID-19 Vaccine  Discontinued   Hepatitis C Screening  Discontinued  *Topic was postponed. The date shown is not the original due date.       02/16/2024    2:50 PM  Advanced Directives  Does Patient Have a Medical Advance Directive? Yes   Type of Estate agent of [place];Living will  Copy of Healthcare Power of Attorney in Chart? No - copy requested  Would patient like information on creating a medical advance directive? Yes (Inpatient - patient requests chaplain consult to create a medical advance directive)   Advance Care Planning is important because it: Ensures you receive medical care that aligns with your values, goals, and preferences. Provides guidance to your family and loved ones, reducing the emotional burden of decision-making during critical moments.  Vision: Annual vision screenings are recommended for early detection of glaucoma, cataracts, and diabetic retinopathy. These exams can also reveal signs of chronic conditions such as diabetes and high blood pressure.  Dental: Annual dental screenings help detect early signs of oral cancer, gum disease, and other conditions linked to overall health, including heart disease and diabetes.

## 2024-02-16 NOTE — Progress Notes (Addendum)
 Subjective:   Joseph Villa is a 83 y.o. who presents for a Medicare Wellness preventive visit.  As a reminder, Annual Wellness Visits don't include a physical exam, and some assessments may be limited, especially if this visit is performed virtually. We may recommend an in-person follow-up visit with your provider if needed.  Visit Complete: In person  Persons Participating in Visit: Patient.  AWV Questionnaire: No: Patient Medicare AWV questionnaire was not completed prior to this visit.  Cardiac Risk Factors include: advanced age (>49men, >7 women);diabetes mellitus;dyslipidemia;hypertension;male gender;obesity (BMI >30kg/m2)     Objective:    Today's Vitals   02/16/24 1451 02/16/24 1505  BP: (!) 160/60 (!) 158/60  Pulse: 71   SpO2: 97%   Weight: 252 lb 6.4 oz (114.5 kg)   Height: 5' 10 (1.778 m)    Body mass index is 36.22 kg/m.     02/16/2024    2:50 PM 09/05/2022    9:21 AM 04/19/2022    9:22 AM 03/08/2022    1:24 PM 05/12/2021   10:17 AM 11/21/2020   12:07 AM 11/16/2020   11:28 AM  Advanced Directives  Does Patient Have a Medical Advance Directive? Yes Yes Yes Yes No Yes Yes  Type of Estate agent of [place];Living will Healthcare Power of [place];Living will    Living will;Healthcare Power of Attorney Living will;Healthcare Power of Attorney  Does patient want to make changes to medical advance directive?    No - Patient declined  No - Patient declined No - Patient declined  Copy of Healthcare Power of Attorney in Chart? No - copy requested No - copy requested    No - copy requested   Would patient like information on creating a medical advance directive? Yes (Inpatient - patient requests chaplain consult to create a medical advance directive)    No - Patient declined No - Patient declined     Current Medications (verified) Outpatient Encounter Medications as of 02/16/2024  Medication Sig   Accu-Chek FastClix Lancets MISC USE TWICE DAILY    ACCU-CHEK GUIDE test strip CHECK BLOOD GLUCOSE (SUGAR) TWICE DAILY   albuterol  (VENTOLIN  HFA) 108 (90 Base) MCG/ACT inhaler INHALE 2 PUFFS 4 TIMES DAILY AS NEEDED   amLODipine  (NORVASC ) 10 MG tablet TAKE ONE TABLET EACH DAY   aspirin  81 MG chewable tablet Chew 1 tablet (81 mg total) by mouth daily.   bromocriptine  (PARLODEL ) 2.5 MG tablet Take 0.5 tablets (1.25 mg total) by mouth daily.   carbamazepine  (TEGRETOL ) 200 MG tablet TAKE TWO TABLETS BY MOUTH AT BEDTIME   Cholecalciferol  (VITAMIN D3) 2000 UNITS TABS Take 2,000 Units by mouth every morning.    clopidogrel  (PLAVIX ) 75 MG tablet Take 1 tablet (75 mg total) by mouth daily.   dapagliflozin  propanediol (FARXIGA ) 10 MG TABS tablet Take 1 tablet (10 mg total) by mouth daily before breakfast.   desoximetasone  (TOPICORT ) 0.25 % cream Apply 1 application topically 2 (two) times daily. (Patient taking differently: Apply 1 application  topically 2 (two) times daily as needed (psoriasis).)   Evolocumab  (REPATHA  SURECLICK) 140 MG/ML SOAJ INJECT 140MG  INTO SKIN EVERY 14 DAYS   ezetimibe  (ZETIA ) 10 MG tablet Take 1 tablet (10 mg total) by mouth daily.   fluticasone  (FLONASE ) 50 MCG/ACT nasal spray ONE SPRAY INTO NOSE EVERY DAY (Patient taking differently: Place 2 sprays into both nostrils daily as needed for rhinitis or allergies.)   Glucosamine HCl (GLUCOSAMINE PO) Take 1 tablet by mouth daily.   glucose blood (ACCU-CHEK  AVIVA PLUS) test strip Used to check blood sugars twice a day Dx E11.9   glucose blood test strip Use to check blood sugar 2 times per day. DX Code: E11.9   hydrocortisone  (PROCTO-MED HC ) 2.5 % rectal cream Place rectally 2 (two) times daily. Annual appt is due  must see provider for future refills   ketoconazole  (NIZORAL ) 2 % cream Apply topically daily.   Lancets Misc. (ACCU-CHEK FASTCLIX LANCET) KIT AS DIRECTED   levothyroxine  (SYNTHROID ) 112 MCG tablet TAKE ONE TABLET EVERY DAY   losartan  (COZAAR ) 50 MG tablet TAKE ONE TABLET BY  MOUTH DAILY   Multiple Vitamins-Minerals (PRESERVISION AREDS 2 PO) Take 1 tablet by mouth daily.   nateglinide  (STARLIX ) 60 MG tablet TAKE 1/2 TABLET THREE TIMES DAILY WITH meals   Omega-3 Fatty Acids (FISH OIL) 1200 MG CAPS Take 1 capsule by mouth daily.   pantoprazole  (PROTONIX ) 40 MG tablet TAKE ONE TABLET DAILY   sitaGLIPtin  (JANUVIA ) 50 MG tablet Take 1 tablet (50 mg total) by mouth daily.   tamsulosin  (FLOMAX ) 0.4 MG CAPS capsule TAKE ONE CAPSULE EACH DAY   TURMERIC CURCUMIN PO Take 500 mg by mouth daily.   vitamin B-12 (CYANOCOBALAMIN ) 1000 MCG tablet Take 1,000 mcg by mouth daily.   No facility-administered encounter medications on file as of 02/16/2024.    Allergies (verified) Cefuroxime axetil, Cefprozil, Cephalosporins, Crestor  [rosuvastatin  calcium ], Grass pollen(k-o-r-t-swt vern), Lipitor [atorvastatin ], Metformin  and related, and Rabeprazole sodium   History: Past Medical History:  Diagnosis Date   ALLERGIC ASTHMA 11/25/2007   ALLERGIC RHINITIS 11/25/2007   Allergy     Arthritis, lumbar spine 04/18/2011   BIPOLAR DISORDER UNSPECIFIED 10/06/2009   Blood transfusion without reported diagnosis    s/p goiter surgery age 68    COLONIC POLYPS, HX OF 10/06/2009   Degenerative arthritis of hip 04/18/2011   DJD (degenerative joint disease)    DM w/o Complication Type II 05/10/2008   GERD 10/06/2009   Hemorrhage yrs ago   after goiter surgery, tracheostomy inserted and later removed   Hemorrhoid    History of kidney stones    HYPERLIPIDEMIA 10/06/2009   HYPERTENSION, BENIGN 07/27/2009   HYPOTHYROIDISM 10/06/2009   NEPHROLITHIASIS, HX OF 10/06/2009   OBSTRUCTIVE SLEEP APNEA 11/25/2007   cpap setting of 9   Peptic stricture of esophagus    RBBB (right bundle branch block)    Sleep apnea    wears c pap   Stroke [facility])    Past Surgical History:  Procedure Laterality Date   COLONOSCOPY     GANGLION CYST EXCISION     2-3 cysts removed   HERNIA REPAIR     JOINT  REPLACEMENT     thyroid  Goiter surgery     TONSILLECTOMY     TONSILLECTOMY     TOTAL HIP ARTHROPLASTY  06/16/2012   Procedure: TOTAL HIP ARTHROPLASTY ANTERIOR APPROACH;  Surgeon: Donnice JONETTA Car, MD;  Location: [facility] OR[facility];  Service: Orthopedics;  Laterality: Left;   TRANSCAROTID ARTERY REVASCULARIZATION  Left 11/20/2020   Procedure: LEFT TRANSCAROTID ARTERY REVASCULARIZATION;  Surgeon: Gretta Lonni PARAS, MD;  Location: [facility] OR;  Service: Vascular;  Laterality: Left;   UMBILICAL HERNIA REPAIR  several yrs ago   UPPER GASTROINTESTINAL ENDOSCOPY     Family History  Problem Relation Age of Onset   Throat cancer Mother    Alcohol abuse Brother    Diabetes Brother    Drug abuse Daughter    Alcoholism Other        uncle  Breast cancer Father    Colon cancer Neg Hx    Colon polyps Neg Hx    Esophageal cancer Neg Hx    Rectal cancer Neg Hx    Stomach cancer Neg Hx    Social History   Socioeconomic History   Marital status: Married    Spouse name: Niels   Number of children: 3   Years of education: Not on file   Highest education level: Not on file  Occupational History   Occupation: retired  Tobacco Use   Smoking status: Former    Current packs/day: 0.00    Average packs/day: 1.5 packs/day for 15.0 years (22.5 ttl pk-yrs)    Types: Cigarettes, Pipe    Start date: 06/23/1962    Quit date: 06/23/1977    Years since quitting: 46.6   Smokeless tobacco: Never  Vaping Use   Vaping status: Never Used  Substance and Sexual Activity   Alcohol use: No    Alcohol/week: 0.0 standard drinks of alcohol   Drug use: No   Sexual activity: Not Currently  Other Topics Concern   Not on file  Social History Narrative   POA: Knut Rondinelli, wife   Social Drivers of Corporate investment banker Strain: Low Risk  (02/16/2024)   Overall Financial Resource Strain (CARDIA)    Difficulty of Paying Living Expenses: Not hard at all  Food Insecurity: No Food Insecurity (02/16/2024)   Hunger Vital Sign     Worried About Running Out of Food in the Last Year: Never true    Ran Out of Food in the Last Year: Never true  Transportation Needs: No Transportation Needs (02/16/2024)   PRAPARE - Administrator, Civil Service (Medical): No    Lack of Transportation (Non-Medical): No  Physical Activity: Insufficiently Active (02/16/2024)   Exercise Vital Sign    Days of Exercise per Week: 7 days    Minutes of Exercise per Session: 20 min  Stress: No Stress Concern Present (02/16/2024)   [employer] of Occupational Health - Occupational Stress Questionnaire    Feeling of Stress: Not at all  Social Connections: Moderately Isolated (02/16/2024)   Social Connection and Isolation Panel    Frequency of Communication with Friends and Family: More than three times a week    Frequency of Social Gatherings with Friends and Family: Twice a week    Attends Religious Services: Never    Database administrator or Organizations: No    Attends Engineer, structural: Never    Marital Status: Married    Tobacco Counseling Counseling given: Not Answered    Clinical Intake:  Pre-visit preparation completed: Yes  Pain : No/denies pain     BMI - recorded: 36.22 Nutritional Status: BMI > 30  Obese Nutritional Risks: None Diabetes: Yes CBG done?: Yes CBG resulted in Enter/ Edit results?: Yes (fasting - 215) Did pt. bring in CBG monitor from home?: No  Lab Results  Component Value Date   HGBA1C 6.9 (A) 10/01/2023   HGBA1C 6.9 (H) 02/14/2023   HGBA1C 6.9 (A) 09/20/2022     How often do you need to have someone help you when you read instructions, pamphlets, or other written materials from your doctor or pharmacy?: 1 - Never  Interpreter Needed?: No  Information entered by :: Verdie Saba, CMA   Activities of Daily Living     02/16/2024    2:57 PM  In your present state of health, do you have any difficulty  performing the following activities:  Hearing? 0  Comment wears  hearing aids  Vision? 0  Difficulty concentrating or making decisions? 0  Walking or climbing stairs? 0  Dressing or bathing? 0  Doing errands, shopping? 0  Preparing Food and eating ? N  Using the Toilet? N  In the past six months, have you accidently leaked urine? N  Do you have problems with loss of bowel control? N  Managing your Medications? N  Managing your Finances? N  Housekeeping or managing your Housekeeping? N    Patient Care Team: Norleen Lynwood ORN, MD as PCP - General [facility] Staff as Consulting Physician (Endocrinology) Leslee Reusing, MD as Consulting Physician (Ophthalmology) Joshua Blamer, MD as Consulting Physician (Dermatology) Neysa Reggy BIRCH, MD as Consulting Physician (Pulmonary Disease) Mona Vinie BROCKS, MD as Consulting Physician (Cardiology)  I have updated your Care Teams any recent Medical Services you may have received from other providers in the past year.     Assessment:   This is a routine wellness examination for [place].  Hearing/Vision screen Hearing Screening - Comments:: Wears a hearing aids Vision Screening - Comments:: Wears rx glasses - up to date with routine eye exams with Reusing Leslee   Goals Addressed               This Visit's Progress     Patient Stated (pt-stated)        Patient stated he plans to continue to manage blood pressure readings and walk more       Depression Screen     02/16/2024    2:58 PM 02/14/2023    1:49 PM 09/05/2022    9:19 AM 04/19/2022    9:21 AM 03/12/2022    3:53 PM 01/04/2022    1:06 PM 07/02/2021    2:20 PM  PHQ 2/9 Scores  PHQ - 2 Score 0 0 0 0 0 0 0  PHQ- 9 Score 0     0     Fall Risk     02/16/2024    2:57 PM 02/14/2023    1:49 PM 09/05/2022    9:14 AM 04/19/2022    9:20 AM 03/12/2022    3:53 PM  Fall Risk   Falls in the past year? 0 0 0 0 0  Number falls in past yr: 0 0 0  0  Injury with Fall? 0 0 0  0  Risk for fall due to : No Fall Risks No Fall Risks Orthopedic patient  No Fall  Risks  Follow up Falls evaluation completed;Falls prevention discussed Falls evaluation completed Falls prevention discussed;Education provided  Falls evaluation completed      Data saved with a previous flowsheet row definition    MEDICARE RISK AT HOME:  Medicare Risk at Home Any stairs in or around the home?: Yes If so, are there any without handrails?: No Home free of loose throw rugs in walkways, pet beds, electrical cords, etc?: Yes Adequate lighting in your home to reduce risk of falls?: Yes Life alert?: No Use of a cane, walker or w/c?: No Grab bars in the bathroom?: No Shower chair or bench in shower?: No Elevated toilet seat or a handicapped toilet?: Yes  TIMED UP AND GO:  Was the test performed?  No  Cognitive Function: 6CIT completed        02/16/2024    3:01 PM 09/05/2022    9:24 AM  6CIT Screen  What Year? 0 points 0 points  What month? 0 points  0 points  What time? 0 points 0 points  Count back from 20 0 points 0 points  Months in reverse 0 points 0 points  Repeat phrase 0 points 0 points  Total Score 0 points 0 points    Immunizations Immunization History  Administered Date(s) Administered   Fluad Quad(high Dose 65+) 02/19/2019, 04/15/2020, 03/09/2021, 03/12/2022   Fluad Trivalent(High Dose 65+) 02/14/2023   H1N1 05/09/2008   INFLUENZA, HIGH DOSE SEASONAL PF 03/17/2015, 03/12/2016, 03/19/2017, 03/02/2018   Influenza Split 03/21/2011, 03/13/2012   Influenza Whole 03/03/2009, 03/08/2010   Influenza,inj,Quad PF,6+ Mos 04/05/2013, 02/15/2014   PFIZER(Purple Top)SARS-COV-2 Vaccination 09/02/2019, 10/02/2019, 05/18/2020   Pneumococcal Conjugate-13 10/13/2013   Pneumococcal Polysaccharide-23 06/04/2007, 10/31/2017   Td 06/04/2007   Tdap 03/21/2011    Screening Tests Health Maintenance  Topic Date Due   Diabetic kidney evaluation - Urine ACR  Never done   Zoster Vaccines- Shingrix (1 of 2) Never done   Influenza Vaccine  01/02/2024   Diabetic kidney  evaluation - eGFR measurement  02/14/2024   FOOT EXAM  02/14/2024   Colonoscopy  02/15/2025 (Originally 12/19/2020)   HEMOGLOBIN A1C  04/01/2024   OPHTHALMOLOGY EXAM  10/23/2024   Medicare Annual Wellness (AWV)  02/15/2025   Pneumococcal Vaccine: 50+ Years  Completed   HPV VACCINES  Aged Out   Meningococcal B Vaccine  Aged Out   DTaP/Tdap/Td  Discontinued   COVID-19 Vaccine  Discontinued   Hepatitis C Screening  Discontinued    Health Maintenance Items Addressed: 02/16/2024   I have recommended that this patient have a Colonoscopy but he declines at this time. I have discussed the risks and benefits of this procedure with him. The patient verbalizes understanding.   Additional Screening:  Vision Screening: Recommended annual ophthalmology exams for early detection of glaucoma and other disorders of the eye. Is the patient up to date with their annual eye exam?  Yes  Who is the provider or what is the name of the office in which the patient attends annual eye exams? [facility]  Dental Screening: Recommended annual dental exams for proper oral hygiene  Community Resource Referral / Chronic Care Management: CRR required this visit?  No   CCM required this visit?  No   Plan:    I have personally reviewed and noted the following in the patient's chart:   Medical and social history Use of alcohol, tobacco or illicit drugs  Current medications and supplements including opioid prescriptions. Patient is not currently taking opioid prescriptions. Functional ability and status Nutritional status Physical activity Advanced directives List of other physicians Hospitalizations, surgeries, and ER visits in previous 12 months Vitals Screenings to include cognitive, depression, and falls Referrals and appointments  In addition, I have reviewed and discussed with patient certain preventive protocols, quality metrics, and best practice recommendations. A written personalized care  plan for preventive services as well as general preventive health recommendations were provided to patient.   Verdie CHRISTELLA Saba, CMA   02/16/2024   After Visit Summary: (In Person-Declined) Patient declined AVS at this time.  Notes: Pt's Physical is scheduled for 02/18/2024.  Pt's BP is elevated but states he feels very good and had just eaten 2 salty hotdogs for lunch prior to arrival.

## 2024-02-18 ENCOUNTER — Encounter: Payer: Self-pay | Admitting: Internal Medicine

## 2024-02-18 ENCOUNTER — Ambulatory Visit (INDEPENDENT_AMBULATORY_CARE_PROVIDER_SITE_OTHER): Admitting: Internal Medicine

## 2024-02-18 VITALS — BP 144/74 | HR 73 | Temp 98.0°F | Ht 70.0 in | Wt 252.8 lb

## 2024-02-18 DIAGNOSIS — E785 Hyperlipidemia, unspecified: Secondary | ICD-10-CM

## 2024-02-18 DIAGNOSIS — E538 Deficiency of other specified B group vitamins: Secondary | ICD-10-CM | POA: Diagnosis not present

## 2024-02-18 DIAGNOSIS — E039 Hypothyroidism, unspecified: Secondary | ICD-10-CM

## 2024-02-18 DIAGNOSIS — E559 Vitamin D deficiency, unspecified: Secondary | ICD-10-CM

## 2024-02-18 DIAGNOSIS — Z7984 Long term (current) use of oral hypoglycemic drugs: Secondary | ICD-10-CM | POA: Diagnosis not present

## 2024-02-18 DIAGNOSIS — Z0001 Encounter for general adult medical examination with abnormal findings: Secondary | ICD-10-CM

## 2024-02-18 DIAGNOSIS — Z0000 Encounter for general adult medical examination without abnormal findings: Secondary | ICD-10-CM

## 2024-02-18 DIAGNOSIS — E1159 Type 2 diabetes mellitus with other circulatory complications: Secondary | ICD-10-CM

## 2024-02-18 DIAGNOSIS — N183 Chronic kidney disease, stage 3 unspecified: Secondary | ICD-10-CM | POA: Diagnosis not present

## 2024-02-18 DIAGNOSIS — I152 Hypertension secondary to endocrine disorders: Secondary | ICD-10-CM | POA: Diagnosis not present

## 2024-02-18 DIAGNOSIS — N1832 Chronic kidney disease, stage 3b: Secondary | ICD-10-CM

## 2024-02-18 DIAGNOSIS — E7800 Pure hypercholesterolemia, unspecified: Secondary | ICD-10-CM | POA: Diagnosis not present

## 2024-02-18 DIAGNOSIS — E1122 Type 2 diabetes mellitus with diabetic chronic kidney disease: Secondary | ICD-10-CM | POA: Diagnosis not present

## 2024-02-18 DIAGNOSIS — Z23 Encounter for immunization: Secondary | ICD-10-CM

## 2024-02-18 LAB — URINALYSIS, ROUTINE W REFLEX MICROSCOPIC
Bilirubin Urine: NEGATIVE
Hgb urine dipstick: NEGATIVE
Leukocytes,Ua: NEGATIVE
Nitrite: NEGATIVE
Specific Gravity, Urine: 1.025 (ref 1.000–1.030)
Total Protein, Urine: >=300 — AB
Urine Glucose: >=1000 — AB
Urobilinogen, UA: 0.2 (ref 0.0–1.0)
pH: 5.5 (ref 5.0–8.0)

## 2024-02-18 LAB — CBC WITH DIFFERENTIAL/PLATELET
Basophils Absolute: 0.1 K/uL (ref 0.0–0.1)
Basophils Relative: 0.8 % (ref 0.0–3.0)
Eosinophils Absolute: 0.4 K/uL (ref 0.0–0.7)
Eosinophils Relative: 5.2 % — ABNORMAL HIGH (ref 0.0–5.0)
HCT: 40.4 % (ref 39.0–52.0)
Hemoglobin: 13.4 g/dL (ref 13.0–17.0)
Lymphocytes Relative: 21.1 % (ref 12.0–46.0)
Lymphs Abs: 1.6 K/uL (ref 0.7–4.0)
MCHC: 33.2 g/dL (ref 30.0–36.0)
MCV: 90.7 fl (ref 78.0–100.0)
Monocytes Absolute: 0.8 K/uL (ref 0.1–1.0)
Monocytes Relative: 10.8 % (ref 3.0–12.0)
Neutro Abs: 4.7 K/uL (ref 1.4–7.7)
Neutrophils Relative %: 62.1 % (ref 43.0–77.0)
Platelets: 218 K/uL (ref 150.0–400.0)
RBC: 4.45 Mil/uL (ref 4.22–5.81)
RDW: 13.9 % (ref 11.5–15.5)
WBC: 7.5 K/uL (ref 4.0–10.5)

## 2024-02-18 LAB — HEPATIC FUNCTION PANEL
ALT: 22 U/L (ref 0–53)
AST: 22 U/L (ref 0–37)
Albumin: 4.1 g/dL (ref 3.5–5.2)
Alkaline Phosphatase: 74 U/L (ref 39–117)
Bilirubin, Direct: 0.1 mg/dL (ref 0.0–0.3)
Total Bilirubin: 0.2 mg/dL (ref 0.2–1.2)
Total Protein: 6.7 g/dL (ref 6.0–8.3)

## 2024-02-18 LAB — HEMOGLOBIN A1C: Hgb A1c MFr Bld: 8 % — ABNORMAL HIGH (ref 4.6–6.5)

## 2024-02-18 LAB — LIPID PANEL
Cholesterol: 174 mg/dL (ref 0–200)
HDL: 47.3 mg/dL (ref >39.00)
NonHDL: 126.35
Total CHOL/HDL Ratio: 4
Triglycerides: 646 mg/dL — ABNORMAL HIGH (ref 0.0–149.0)
VLDL: 129.2 mg/dL — ABNORMAL HIGH (ref 0.0–40.0)

## 2024-02-18 LAB — MICROALBUMIN / CREATININE URINE RATIO
Creatinine,U: 159 mg/dL
Microalb Creat Ratio: 935.8 mg/g — ABNORMAL HIGH (ref 0.0–30.0)
Microalb, Ur: 148.8 mg/dL — ABNORMAL HIGH (ref 0.0–1.9)

## 2024-02-18 LAB — LDL CHOLESTEROL, DIRECT: Direct LDL: 87 mg/dL

## 2024-02-18 LAB — BASIC METABOLIC PANEL WITH GFR
BUN: 34 mg/dL — ABNORMAL HIGH (ref 6–23)
CO2: 22 meq/L (ref 19–32)
Calcium: 9.3 mg/dL (ref 8.4–10.5)
Chloride: 102 meq/L (ref 96–112)
Creatinine, Ser: 1.86 mg/dL — ABNORMAL HIGH (ref 0.40–1.50)
GFR: 33.67 mL/min — ABNORMAL LOW (ref >60.00)
Glucose, Bld: 199 mg/dL — ABNORMAL HIGH (ref 70–99)
Potassium: 4.6 meq/L (ref 3.5–5.1)
Sodium: 137 meq/L (ref 135–145)

## 2024-02-18 LAB — TSH: TSH: 5.58 u[IU]/mL — ABNORMAL HIGH (ref 0.35–5.50)

## 2024-02-18 LAB — VITAMIN D 25 HYDROXY (VIT D DEFICIENCY, FRACTURES): VITD: 26.64 ng/mL — ABNORMAL LOW (ref 30.00–100.00)

## 2024-02-18 LAB — VITAMIN B12: Vitamin B-12: 211 pg/mL (ref 211–911)

## 2024-02-18 MED ORDER — PANTOPRAZOLE SODIUM 40 MG PO TBEC: 40.0000 mg | DELAYED_RELEASE_TABLET | Freq: Every day | ORAL | 3 refills | Status: AC

## 2024-02-18 MED ORDER — DESOXIMETASONE 0.25 % EX CREA: 1.0000 | TOPICAL_CREAM | Freq: Two times a day (BID) | CUTANEOUS | 1 refills | Status: AC | PRN

## 2024-02-18 MED ORDER — REPATHA SURECLICK 140 MG/ML ~~LOC~~ SOAJ: 140.0000 mg | SUBCUTANEOUS | 11 refills | Status: AC

## 2024-02-18 MED ORDER — TAMSULOSIN HCL 0.4 MG PO CAPS: ORAL_CAPSULE | ORAL | 3 refills | Status: AC

## 2024-02-18 MED ORDER — CARBAMAZEPINE 200 MG PO TABS: 400.0000 mg | ORAL_TABLET | Freq: Every day | ORAL | 1 refills | Status: AC

## 2024-02-18 MED ORDER — AMLODIPINE BESYLATE 10 MG PO TABS: 10.0000 mg | ORAL_TABLET | Freq: Every day | ORAL | 3 refills | Status: AC

## 2024-02-18 MED ORDER — LEVOTHYROXINE SODIUM 112 MCG PO TABS: 112.0000 ug | ORAL_TABLET | Freq: Every day | ORAL | 3 refills | Status: AC

## 2024-02-18 MED ORDER — EZETIMIBE 10 MG PO TABS: 10.0000 mg | ORAL_TABLET | Freq: Every day | ORAL | 3 refills | Status: AC

## 2024-02-18 MED ORDER — LOSARTAN POTASSIUM 100 MG PO TABS: 100.0000 mg | ORAL_TABLET | Freq: Every day | ORAL | 3 refills | Status: AC

## 2024-02-18 MED ORDER — CLOPIDOGREL BISULFATE 75 MG PO TABS: 75.0000 mg | ORAL_TABLET | Freq: Every day | ORAL | 3 refills | Status: AC

## 2024-02-18 NOTE — Assessment & Plan Note (Signed)
 Last vitamin D  Lab Results  Component Value Date   VD25OH 26.64 (L) 02/18/2024   Low, to start oral replacement

## 2024-02-18 NOTE — Assessment & Plan Note (Signed)
 Lab Results  Component Value Date   LDLCALC 118 (H) 11/21/2020   uncontrolled, pt to continue current repatha  140 mg and zetia  10 mg and f/u lab today

## 2024-02-18 NOTE — Assessment & Plan Note (Signed)
Age and sex appropriate education and counseling updated with regular exercise and diet Referrals for preventative services - none needed Immunizations addressed - for flu shot today, for shingrix at pharmacy Smoking counseling  - none needed Evidence for depression or other mood disorder - none significant Most recent labs reviewed. I have personally reviewed and have noted: 1) the patient's medical and social history 2) The patient's current medications and supplements 3) The patient's height, weight, and BMI have been recorded in the chart  

## 2024-02-18 NOTE — Progress Notes (Signed)
 Patient ID: Joseph Villa, male   DOB: 02-13-1941, 83 y.o.   MRN: [id number]         Chief Complaint:: wellness exam and htn, dm, hld, low thyroid        HPI:  Joseph Villa is a 83 y.o. male here for wellness exam; due for flu shot today, for shingrix at pharmacy, o/w up to date and declines colonoscopy due to age.                            Also BP has been mild elevated at home as well as today.  Pt denies chest pain, increased sob or doe, wheezing, orthopnea, PND, increased LE swelling, palpitations, dizziness or syncope.   Pt denies polydipsia, polyuria, or new focal neuro s/s.    Pt denies fever, wt loss, night sweats, loss of appetite, or other constitutional symptoms     Wt Readings from Last 3 Encounters:  02/18/24 252 lb 12.8 oz (114.7 kg)  02/16/24 252 lb 6.4 oz (114.5 kg)  01/27/24 251 lb 9.6 oz (114.1 kg)   BP Readings from Last 3 Encounters:  02/18/24 (!) 144/74  02/16/24 (!) 158/60  01/27/24 (!) 156/75   Immunization History  Administered Date(s) Administered   Fluad Quad(high Dose 65+) 02/19/2019, 04/15/2020, 03/09/2021, 03/12/2022   Fluad Trivalent(High Dose 65+) 02/14/2023   H1N1 05/09/2008   INFLUENZA, HIGH DOSE SEASONAL PF 03/17/2015, 03/12/2016, 03/19/2017, 03/02/2018, 02/18/2024   Influenza Split 03/21/2011, 03/13/2012   Influenza Whole 03/03/2009, 03/08/2010   Influenza,inj,Quad PF,6+ Mos 04/05/2013, 02/15/2014   PFIZER(Purple Top)SARS-COV-2 Vaccination 09/02/2019, 10/02/2019, 05/18/2020   Pneumococcal Conjugate-13 10/13/2013   Pneumococcal Polysaccharide-23 06/04/2007, 10/31/2017   Td 06/04/2007   Tdap 03/21/2011   Health Maintenance Due  Topic Date Due   Zoster Vaccines- Shingrix (1 of 2) Never done      Past Medical History:  Diagnosis Date   ALLERGIC ASTHMA 11/25/2007   ALLERGIC RHINITIS 11/25/2007   Allergy     Arthritis, lumbar spine 04/18/2011   BIPOLAR DISORDER UNSPECIFIED 10/06/2009   Blood transfusion without reported diagnosis     s/p goiter surgery age 61    COLONIC POLYPS, HX OF 10/06/2009   Degenerative arthritis of hip 04/18/2011   DJD (degenerative joint disease)    DM w/o Complication Type II 05/10/2008   GERD 10/06/2009   Hemorrhage yrs ago   after goiter surgery, tracheostomy inserted and later removed   Hemorrhoid    History of kidney stones    HYPERLIPIDEMIA 10/06/2009   HYPERTENSION, BENIGN 07/27/2009   HYPOTHYROIDISM 10/06/2009   NEPHROLITHIASIS, HX OF 10/06/2009   OBSTRUCTIVE SLEEP APNEA 11/25/2007   cpap setting of 9   Peptic stricture of esophagus    RBBB (right bundle branch block)    Sleep apnea    wears c pap   Stroke [facility])    Past Surgical History:  Procedure Laterality Date   COLONOSCOPY     GANGLION CYST EXCISION     2-3 cysts removed   HERNIA REPAIR     JOINT REPLACEMENT     thyroid  Goiter surgery     TONSILLECTOMY     TONSILLECTOMY     TOTAL HIP ARTHROPLASTY  06/16/2012   Procedure: TOTAL HIP ARTHROPLASTY ANTERIOR APPROACH;  Surgeon: Donnice JONETTA Car, MD;  Location: [facility];  Service: Orthopedics;  Laterality: Left;   TRANSCAROTID ARTERY REVASCULARIZATION  Left 11/20/2020   Procedure: LEFT TRANSCAROTID ARTERY REVASCULARIZATION;  Surgeon: Gretta Lonni PARAS, MD;  Location: [facility] OR;  Service: Vascular;  Laterality: Left;   UMBILICAL HERNIA REPAIR  several yrs ago   UPPER GASTROINTESTINAL ENDOSCOPY      reports that he quit smoking about 46 years ago. His smoking use included cigarettes and pipe. He started smoking about 61 years ago. He has a 22.5 pack-year smoking history. He has never used smokeless tobacco. He reports that he does not drink alcohol and does not use drugs. family history includes Alcohol abuse in his brother; Alcoholism in an other family member; Breast cancer in his father; Diabetes in his brother; Drug abuse in his daughter; Throat cancer in his mother. Allergies  Allergen Reactions   Cefuroxime Axetil Anaphylaxis    REACTION: anaphylaxis-ceftin    Cefprozil      REACTION: unknown   Cephalosporins Nausea Only   Crestor  [Rosuvastatin  Calcium ]     Muscle aches    Grass Pollen(K-O-R-T-Swt Vern)    Lipitor [Atorvastatin ]     myalgiaas   Metformin  And Related Diarrhea   Rabeprazole Sodium Nausea Only    REACTION: nausea   Current Outpatient Medications on File Prior to Visit  Medication Sig Dispense Refill   Accu-Chek FastClix Lancets MISC USE TWICE DAILY 102 each 2   ACCU-CHEK GUIDE test strip CHECK BLOOD GLUCOSE (SUGAR) TWICE DAILY 200 strip 3   albuterol  (VENTOLIN  HFA) 108 (90 Base) MCG/ACT inhaler INHALE 2 PUFFS 4 TIMES DAILY AS NEEDED 8.5 g 0   aspirin  81 MG chewable tablet Chew 1 tablet (81 mg total) by mouth daily. 90 tablet 3   bromocriptine  (PARLODEL ) 2.5 MG tablet Take 0.5 tablets (1.25 mg total) by mouth daily. 45 tablet 3   Cholecalciferol  (VITAMIN D3) 2000 UNITS TABS Take 2,000 Units by mouth every morning.      dapagliflozin  propanediol (FARXIGA ) 10 MG TABS tablet Take 1 tablet (10 mg total) by mouth daily before breakfast. 90 tablet 3   fluticasone  (FLONASE ) 50 MCG/ACT nasal spray ONE SPRAY INTO NOSE EVERY DAY (Patient taking differently: Place 2 sprays into both nostrils daily as needed for rhinitis or allergies.) 48 g 1   Glucosamine HCl (GLUCOSAMINE PO) Take 1 tablet by mouth daily.     glucose blood (ACCU-CHEK AVIVA PLUS) test strip Used to check blood sugars twice a day Dx E11.9 200 each 0   glucose blood test strip Use to check blood sugar 2 times per day. DX Code: E11.9 200 each 2   hydrocortisone  (PROCTO-MED HC ) 2.5 % rectal cream Place rectally 2 (two) times daily. Annual appt is due  must see provider for future refills 30 g 0   ketoconazole  (NIZORAL ) 2 % cream Apply topically daily.     Lancets Misc. (ACCU-CHEK FASTCLIX LANCET) KIT AS DIRECTED 1 kit 0   Multiple Vitamins-Minerals (PRESERVISION AREDS 2 PO) Take 1 tablet by mouth daily.     nateglinide  (STARLIX ) 60 MG tablet TAKE 1/2 TABLET THREE TIMES DAILY WITH meals 135  tablet 3   Omega-3 Fatty Acids (FISH OIL) 1200 MG CAPS Take 1 capsule by mouth daily.     sitaGLIPtin  (JANUVIA ) 50 MG tablet Take 1 tablet (50 mg total) by mouth daily. 90 tablet 3   TURMERIC CURCUMIN PO Take 500 mg by mouth daily.     vitamin B-12 (CYANOCOBALAMIN ) 1000 MCG tablet Take 1,000 mcg by mouth daily.     No current facility-administered medications on file prior to visit.        ROS:  All others reviewed and negative.  Objective  PE:  BP (!) 144/74   Pulse 73   Temp 98 F (36.7 C)   Ht 5' 10 (1.778 m)   Wt 252 lb 12.8 oz (114.7 kg)   SpO2 96%   BMI 36.27 kg/m                 Constitutional: Pt appears in NAD               HENT: Head: NCAT.                Right Ear: External ear normal.                 Left Ear: External ear normal.                Eyes: . Pupils are equal, round, and reactive to light. Conjunctivae and EOM are normal               Nose: without d/c or deformity               Neck: Neck supple. Gross normal ROM               Cardiovascular: Normal rate and regular rhythm.                 Pulmonary/Chest: Effort normal and breath sounds without rales or wheezing.                Abd:  Soft, NT, ND, + BS, no organomegaly               Neurological: Pt is alert. At baseline orientation, motor grossly intact               Skin: Skin is warm. No rashes, no other new lesions, LE edema - none               Psychiatric: Pt behavior is normal without agitation   Micro: none  Cardiac tracings I have personally interpreted today:  none  Pertinent Radiological findings (summarize): none   Lab Results  Component Value Date   WBC 7.5 02/18/2024   HGB 13.4 02/18/2024   HCT 40.4 02/18/2024   PLT 218.0 02/18/2024   GLUCOSE 199 (H) 02/18/2024   CHOL 174 02/18/2024   TRIG (H) 02/18/2024    646.0 Triglyceride is over 400; calculations on Lipids are invalid.   HDL 47.30 02/18/2024   LDLDIRECT 87.0 02/18/2024   LDLCALC 118 (H) 11/21/2020   ALT 22  02/18/2024   AST 22 02/18/2024   NA 137 02/18/2024   K 4.6 02/18/2024   CL 102 02/18/2024   CREATININE 1.86 (H) 02/18/2024   BUN 34 (H) 02/18/2024   CO2 22 02/18/2024   TSH 5.58 (H) 02/18/2024   PSA 0.84 10/23/2020   INR 1.0 11/16/2020   HGBA1C 8.0 (H) 02/18/2024   MICROALBUR 148.8 (H) 02/18/2024   Assessment/Plan:  GEOVONNI MEYERHOFF is a 83 y.o. White or Caucasian [1] male with  has a past medical history of ALLERGIC ASTHMA (11/25/2007), ALLERGIC RHINITIS (11/25/2007), Allergy , Arthritis, lumbar spine (04/18/2011), BIPOLAR DISORDER UNSPECIFIED (10/06/2009), Blood transfusion without reported diagnosis, COLONIC POLYPS, HX OF (10/06/2009), Degenerative arthritis of hip (04/18/2011), DJD (degenerative joint disease), DM w/o Complication Type II (05/10/2008), GERD (10/06/2009), Hemorrhage (yrs ago), Hemorrhoid, History of kidney stones, HYPERLIPIDEMIA (10/06/2009), HYPERTENSION, BENIGN (07/27/2009), HYPOTHYROIDISM (10/06/2009), NEPHROLITHIASIS, HX OF (10/06/2009), OBSTRUCTIVE SLEEP APNEA (11/25/2007), Peptic stricture of esophagus, RBBB (right bundle branch block), Sleep apnea, and Stroke (HCC).  Encounter for well adult exam with abnormal findings Age and sex appropriate education and counseling updated with regular exercise and diet Referrals for preventative services - none needed Immunizations addressed - for flu shot today, for shingrix at pharmacy Smoking counseling  - none needed Evidence for depression or other mood disorder - none significant Most recent labs reviewed. I have personally reviewed and have noted: 1) the patient's medical and social history 2) The patient's current medications and supplements 3) The patient's height, weight, and BMI have been recorded in the chart   CKD (chronic kidney disease), stage III (HCC) Lab Results  Component Value Date   CREATININE 1.86 (H) 02/18/2024   Stable overall, cont to avoid nephrotoxins, f/u renal as  planned  Hyperlipidemia Lab Results  Component Value Date   LDLCALC 118 (H) 11/21/2020   uncontrolled, pt to continue current repatha  140 mg and zetia  10 mg and f/u lab today   Hypertension associated with diabetes (HCC) BP Readings from Last 3 Encounters:  02/18/24 (!) 144/74  02/16/24 (!) 158/60  01/27/24 (!) 156/75   uncontrolled, pt to continue medical treatment norvasc  10 every day but increase losartan  to 100 mg qd   Hypothyroidism Lab Results  Component Value Date   TSH 5.58 (H) 02/18/2024   Slight uncontrolled, pt to continue levothyroxine  112 mcg every day as is for now, f/u lab next visit   Type 2 diabetes mellitus (HCC) Lab Results  Component Value Date   HGBA1C 8.0 (H) 02/18/2024   Uncontrolled, pt to continue current medical treatment januvia  50 every day, starlix  30 bid, farxiga  10 every day, and declines change today, for endo f/u as planned  Vitamin D  deficiency Last vitamin D  Lab Results  Component Value Date   VD25OH 26.64 (L) 02/18/2024   Low, to start oral replacement  Followup: Return in about 6 months (around 08/17/2024).  Lynwood Rush, MD 02/18/2024 8:00 PM [facility]

## 2024-02-18 NOTE — Patient Instructions (Addendum)
 Please have your Shingrix (shingles) shots done at your local pharmacy.  You had the flu shot today  Ok to increase the Losartan  to 100 mg per day  Please continue all other medications as before, and refills have been done if requested.  Please have the pharmacy call with any other refills you may need.  Please continue your efforts at being more active, low cholesterol diet, and weight control.  You are otherwise up to date with prevention measures today.  Please keep your appointments with your specialists as you may have planned  Please go to the LAB at the blood drawing area for the tests to be done  You will be contacted by phone if any changes need to be made immediately.  Otherwise, you will receive a letter about your results with an explanation, but please check with MyChart first.  Please make an Appointment to return in 6 months, or sooner if needed

## 2024-02-18 NOTE — Assessment & Plan Note (Addendum)
 Lab Results  Component Value Date   CREATININE 1.86 (H) 02/18/2024   Stable overall, cont to avoid nephrotoxins, f/u renal as planned

## 2024-02-18 NOTE — Assessment & Plan Note (Signed)
 BP Readings from Last 3 Encounters:  02/18/24 (!) 144/74  02/16/24 (!) 158/60  01/27/24 (!) 156/75   uncontrolled, pt to continue medical treatment norvasc  10 every day but increase losartan  to 100 mg qd

## 2024-02-18 NOTE — Assessment & Plan Note (Signed)
 Lab Results  Component Value Date   HGBA1C 8.0 (H) 02/18/2024   Uncontrolled, pt to continue current medical treatment januvia  50 every day, starlix  30 bid, farxiga  10 every day, and declines change today, for endo f/u as planned

## 2024-02-18 NOTE — Assessment & Plan Note (Signed)
 Lab Results  Component Value Date   TSH 5.58 (H) 02/18/2024   Slight uncontrolled, pt to continue levothyroxine  112 mcg every day as is for now, f/u lab next visit

## 2024-02-19 ENCOUNTER — Ambulatory Visit: Payer: Self-pay | Admitting: Internal Medicine

## 2024-02-27 DIAGNOSIS — H16041 Marginal corneal ulcer, right eye: Secondary | ICD-10-CM | POA: Diagnosis not present

## 2024-03-02 DIAGNOSIS — H16041 Marginal corneal ulcer, right eye: Secondary | ICD-10-CM | POA: Diagnosis not present

## 2024-03-05 DIAGNOSIS — H16041 Marginal corneal ulcer, right eye: Secondary | ICD-10-CM | POA: Diagnosis not present

## 2024-03-22 DIAGNOSIS — H10411 Chronic giant papillary conjunctivitis, right eye: Secondary | ICD-10-CM | POA: Diagnosis not present

## 2024-03-31 DIAGNOSIS — L570 Actinic keratosis: Secondary | ICD-10-CM | POA: Diagnosis not present

## 2024-03-31 DIAGNOSIS — L821 Other seborrheic keratosis: Secondary | ICD-10-CM | POA: Diagnosis not present

## 2024-03-31 DIAGNOSIS — Z85828 Personal history of other malignant neoplasm of skin: Secondary | ICD-10-CM | POA: Diagnosis not present

## 2024-03-31 DIAGNOSIS — D1801 Hemangioma of skin and subcutaneous tissue: Secondary | ICD-10-CM | POA: Diagnosis not present

## 2024-03-31 DIAGNOSIS — L309 Dermatitis, unspecified: Secondary | ICD-10-CM | POA: Diagnosis not present

## 2024-04-01 ENCOUNTER — Ambulatory Visit: Admitting: Internal Medicine

## 2024-04-01 ENCOUNTER — Encounter: Payer: Self-pay | Admitting: Internal Medicine

## 2024-04-01 VITALS — BP 168/88 | Ht 70.0 in | Wt 253.0 lb

## 2024-04-01 DIAGNOSIS — E1142 Type 2 diabetes mellitus with diabetic polyneuropathy: Secondary | ICD-10-CM

## 2024-04-01 DIAGNOSIS — E1122 Type 2 diabetes mellitus with diabetic chronic kidney disease: Secondary | ICD-10-CM

## 2024-04-01 DIAGNOSIS — N1832 Chronic kidney disease, stage 3b: Secondary | ICD-10-CM | POA: Diagnosis not present

## 2024-04-01 DIAGNOSIS — Z7984 Long term (current) use of oral hypoglycemic drugs: Secondary | ICD-10-CM | POA: Diagnosis not present

## 2024-04-01 MED ORDER — NATEGLINIDE 60 MG PO TABS
60.0000 mg | ORAL_TABLET | Freq: Three times a day (TID) | ORAL | 3 refills | Status: AC
Start: 2024-04-01 — End: ?

## 2024-04-01 NOTE — Patient Instructions (Signed)
 Please take sitaGLIPtin  (JANUVIA ) 50 MG, 1  tablet daily  Continue Farxiga  10 mg daily Continue Bromocriptine  2.5 mg, half a tablet daily  Increase  Starlix  60 mg , 1 tablet before each meal     HOW TO TREAT LOW BLOOD SUGARS (Blood sugar LESS THAN 70 MG/DL) Please follow the RULE OF 15 for the treatment of hypoglycemia treatment (when your (blood sugars are less than 70 mg/dL)   STEP 1: Take 15 grams of carbohydrates when your blood sugar is low, which includes:  3-4 GLUCOSE TABS  OR 3-4 OZ OF JUICE OR REGULAR SODA OR ONE TUBE OF GLUCOSE GEL    STEP 2: RECHECK blood sugar in 15 MINUTES STEP 3: If your blood sugar is still low at the 15 minute recheck --> then, go back to STEP 1 and treat AGAIN with another 15 grams of carbohydrates.

## 2024-04-01 NOTE — Progress Notes (Signed)
 Name: Joseph Villa  Age/ Sex: 83 y.o., male   MRN/ DOB: [id number], 11-Aug-1940     PCP: Norleen Lynwood ORN, MD   Reason for Endocrinology Evaluation: Type 2 Diabetes Mellitus  Initial Endocrine Consultative Visit: 02/14/2015    PATIENT IDENTIFIER: Joseph Villa is a 83 y.o. male with a past medical history of Dm, dyslipidemia . The patient has followed with Endocrinology clinic since 02/14/2015 for consultative assistance with management of his diabetes.  DIABETIC HISTORY:  Joseph Villa was diagnosed with DM 2007. His hemoglobin A1c has ranged from 5.7% in 2018, peaking at 7.9% in 2019.  He was followed by Dr. Kassie from 2016 until March 2023   SUBJECTIVE:   During the last visit (10/01/2023): A1c 6.9%  Today (04/01/2024): Joseph Villa is here for follow-up on diabetes management.  He checks his blood sugars 1 times daily. The patient has not had hypoglycemic episodes since the last clinic visit    Patient follows with vascular surgery for carotid artery stenosis, he continues to be on aspirin , Plavix , and Repatha  Patient continues to follow-up with cardiology for dyslipidemia Patient follows with pulmonary for OSA, on CPAP   He has been eating chocolate chip cookies  No nausea  No constipation or diarrhea     HOME DIABETES REGIMEN:  Januvia  50, 1 tablet  tablet daily Farxiga  10 mg daily Bromocriptine  2.5 mg, half a tablet daily  Starlix  60 mg , half a tablet TIDQAC    Statin: Patient on Repatha  ACE-I/ARB: Yes    METER DOWNLOAD SUMMARY: n/a    DIABETIC COMPLICATIONS: Microvascular complications:  CKD III, neuropathy Denies:  Last Eye Exam: Completed 10/24/2023  Macrovascular complications:   Denies: CAD, CVA, PVD   HISTORY:  Past Medical History:  Past Medical History:  Diagnosis Date   ALLERGIC ASTHMA 11/25/2007   ALLERGIC RHINITIS 11/25/2007   Allergy     Arthritis, lumbar spine 04/18/2011   BIPOLAR DISORDER UNSPECIFIED 10/06/2009   Blood  transfusion without reported diagnosis    s/p goiter surgery age 17    COLONIC POLYPS, HX OF 10/06/2009   Degenerative arthritis of hip 04/18/2011   DJD (degenerative joint disease)    DM w/o Complication Type II 05/10/2008   GERD 10/06/2009   Hemorrhage yrs ago   after goiter surgery, tracheostomy inserted and later removed   Hemorrhoid    History of kidney stones    HYPERLIPIDEMIA 10/06/2009   HYPERTENSION, BENIGN 07/27/2009   HYPOTHYROIDISM 10/06/2009   NEPHROLITHIASIS, HX OF 10/06/2009   OBSTRUCTIVE SLEEP APNEA 11/25/2007   cpap setting of 9   Peptic stricture of esophagus    RBBB (right bundle branch block)    Sleep apnea    wears c pap   Stroke [facility])    Past Surgical History:  Past Surgical History:  Procedure Laterality Date   COLONOSCOPY     GANGLION CYST EXCISION     2-3 cysts removed   HERNIA REPAIR     JOINT REPLACEMENT     thyroid  Goiter surgery     TONSILLECTOMY     TONSILLECTOMY     TOTAL HIP ARTHROPLASTY  06/16/2012   Procedure: TOTAL HIP ARTHROPLASTY ANTERIOR APPROACH;  Surgeon: Donnice JONETTA Car, MD;  Location: [facility];  Service: Orthopedics;  Laterality: Left;   TRANSCAROTID ARTERY REVASCULARIZATION  Left 11/20/2020   Procedure: LEFT TRANSCAROTID ARTERY REVASCULARIZATION;  Surgeon: Gretta Lonni PARAS, MD;  Location: [facility] OR;  Service: Vascular;  Laterality: Left;   UMBILICAL HERNIA REPAIR  several  yrs ago   UPPER GASTROINTESTINAL ENDOSCOPY     Social History:  reports that he quit smoking about 46 years ago. His smoking use included cigarettes and pipe. He started smoking about 61 years ago. He has a 22.5 pack-year smoking history. He has never used smokeless tobacco. He reports that he does not drink alcohol and does not use drugs. Family History:  Family History  Problem Relation Age of Onset   Throat cancer Mother    Alcohol abuse Brother    Diabetes Brother    Drug abuse Daughter    Alcoholism Other        uncle   Breast cancer Father    Colon  cancer Neg Hx    Colon polyps Neg Hx    Esophageal cancer Neg Hx    Rectal cancer Neg Hx    Stomach cancer Neg Hx      HOME MEDICATIONS: Allergies as of 04/01/2024       Reactions   Cefuroxime Axetil Anaphylaxis   REACTION: anaphylaxis-ceftin   Cefprozil    REACTION: unknown   Cephalosporins Nausea Only   Crestor  [rosuvastatin  Calcium ]    Muscle aches    Grass Pollen(k-o-r-t-swt Vern)    Lipitor [atorvastatin ]    myalgiaas   Metformin  And Related Diarrhea   Rabeprazole Sodium Nausea Only   REACTION: nausea        Medication List        Accurate as of April 01, 2024 10:58 AM. If you have any questions, ask your nurse or doctor.          Accu-Chek FastClix Lancet Kit AS DIRECTED   Accu-Chek FastClix Lancets Misc USE TWICE DAILY   albuterol  108 (90 Base) MCG/ACT inhaler Commonly known as: VENTOLIN  HFA INHALE 2 PUFFS 4 TIMES DAILY AS NEEDED   amLODipine  10 MG tablet Commonly known as: NORVASC  Take 1 tablet (10 mg total) by mouth daily.   aspirin  81 MG chewable tablet Chew 1 tablet (81 mg total) by mouth daily.   bromocriptine  2.5 MG tablet Commonly known as: PARLODEL  Take 0.5 tablets (1.25 mg total) by mouth daily.   carbamazepine  200 MG tablet Commonly known as: TEGRETOL  Take 2 tablets (400 mg total) by mouth at bedtime.   clopidogrel  75 MG tablet Commonly known as: PLAVIX  Take 1 tablet (75 mg total) by mouth daily.   cyanocobalamin  1000 MCG tablet Commonly known as: VITAMIN B12 Take 1,000 mcg by mouth daily.   dapagliflozin  propanediol 10 MG Tabs tablet Commonly known as: Farxiga  Take 1 tablet (10 mg total) by mouth daily before breakfast.   desoximetasone  0.25 % cream Commonly known as: TOPICORT  Apply 1 Application topically 2 (two) times daily as needed (psoriasis).   ezetimibe  10 MG tablet Commonly known as: ZETIA  Take 1 tablet (10 mg total) by mouth daily.   Fish Oil 1200 MG Caps Take 1 capsule by mouth daily.   fluticasone  50  MCG/ACT nasal spray Commonly known as: FLONASE  ONE SPRAY INTO NOSE EVERY DAY What changed: See the new instructions.   GLUCOSAMINE PO Take 1 tablet by mouth daily.   glucose blood test strip Use to check blood sugar 2 times per day. DX Code: E11.9   glucose blood test strip Commonly known as: Accu-Chek Aviva Plus Used to check blood sugars twice a day Dx E11.9   Accu-Chek Guide test strip Generic drug: glucose blood CHECK BLOOD GLUCOSE (SUGAR) TWICE DAILY   hydrocortisone  2.5 % rectal cream Commonly known as: Procto-Med HC  Place rectally  2 (two) times daily. Annual appt is due  must see provider for future refills   ketoconazole  2 % cream Commonly known as: NIZORAL  Apply topically daily.   levothyroxine  112 MCG tablet Commonly known as: SYNTHROID  Take 1 tablet (112 mcg total) by mouth daily.   losartan  100 MG tablet Commonly known as: COZAAR  Take 1 tablet (100 mg total) by mouth daily.   nateglinide  60 MG tablet Commonly known as: STARLIX  Take 1 tablet (60 mg total) by mouth 3 (three) times daily with meals. What changed:  how much to take how to take this when to take this additional instructions Changed by: Donell PARAS Jameria Villa   pantoprazole  40 MG tablet Commonly known as: PROTONIX  Take 1 tablet (40 mg total) by mouth daily.   PRESERVISION AREDS 2 PO Take 1 tablet by mouth daily.   Repatha  SureClick 140 MG/ML Soaj Generic drug: Evolocumab  Inject 140 mg into the skin every 14 (fourteen) days.   sitaGLIPtin  50 MG tablet Commonly known as: Januvia  Take 1 tablet (50 mg total) by mouth daily.   tamsulosin  0.4 MG Caps capsule Commonly known as: FLOMAX  TAKE ONE CAPSULE EACH DAY   TURMERIC CURCUMIN PO Take 500 mg by mouth daily.   Vitamin D3 50 MCG (2000 UT) Tabs Take 2,000 Units by mouth every morning.         OBJECTIVE:   Vital Signs: BP (!) 168/88   Ht 5' 10 (1.778 m)   Wt 253 lb (114.8 kg)   BMI 36.30 kg/m   Wt Readings from Last 3  Encounters:  04/01/24 253 lb (114.8 kg)  02/18/24 252 lb 12.8 oz (114.7 kg)  02/16/24 252 lb 6.4 oz (114.5 kg)     Exam: General: Pt appears well and is in NAD  Lungs: Clear with good BS bilat   Heart: RRR   Extremities: No pretibial edema.   Neuro: MS is good with appropriate affect, pt is alert and Ox3    DM foot exam: 04/01/2024  The skin of the feet is intact without sores or ulcerations. The pedal pulses are 1+ on right and 1+ on left. The sensation is decreased  to a screening 5.07, 10 gram monofilament on the left      DATA REVIEWED:  Lab Results  Component Value Date   HGBA1C 8.0 (H) 02/18/2024   HGBA1C 6.9 (A) 10/01/2023   HGBA1C 6.9 (H) 02/14/2023     Latest Reference Range & Units 02/18/24 14:33  Sodium 135 - 145 mEq/L 137  Potassium 3.5 - 5.1 mEq/L 4.6  Chloride 96 - 112 mEq/L 102  CO2 19 - 32 mEq/L 22  Glucose 70 - 99 mg/dL 800 (H)  BUN 6 - 23 mg/dL 34 (H)  Creatinine 9.59 - 1.50 mg/dL 8.13 (H)  Calcium  8.4 - 10.5 mg/dL 9.3  Alkaline Phosphatase 39 - 117 U/L 74  Albumin  3.5 - 5.2 g/dL 4.1  AST 0 - 37 U/L 22  ALT 0 - 53 U/L 22  Total Protein 6.0 - 8.3 g/dL 6.7  Bilirubin, Direct 0.0 - 0.3 mg/dL 0.1  Total Bilirubin 0.2 - 1.2 mg/dL 0.2  GFR >39.99 mL/min 33.67 (L)  (H): Data is abnormally high (L): Data is abnormally low    ASSESSMENT / PLAN / RECOMMENDATIONS:   1) Type 2 Diabetes Mellitus, poorly controlled, With neuropathic and CKD III complications - Most recent A1c of 8.0 %. Goal A1c <7.5%.    - His A1c has trended up from 6.9% to 8.0% - Limited  glycemic agents due to CKD III - Patient admits to dietary indiscretions, he is motivated to follow a low-carb diet - I did offer switching Januvia  to GLP-1 agonist, patient would like to increase Starlix  at this time and if hypoglycemia persists then we will switch to a GLP-1 agonist - Caution against hypoglycemia with increasing Starlix  dose and to contact our office with hypoglycemia, so we can  make the appropriate adjustments - Patient is not knowledgeable of his medication, as his wife manages his medication  MEDICATIONS: Continue Januvia  50 mg daily  Continue Farxiga  10 mg daily  Continue Bromocriptine  2.5 mg, half a tablet daily  Increase Starlix  60 mg, 1 tablet 3 times daily before every meal  EDUCATION / INSTRUCTIONS: BG monitoring instructions: Patient is instructed to check his blood sugars 1 times a day, fasting . Call [facility] if: BG persistently < 70  I reviewed the Rule of 15 for the treatment of hypoglycemia in detail with the patient. Literature supplied.    2) Diabetic complications:  Eye: Does not have known diabetic retinopathy.  Neuro/ Feet: Does  have known diabetic peripheral neuropathy .  Renal: Patient does  have known baseline CKD. He   is  on an ACEI/ARB at present.      F/U in 3 months     Signed electronically by: Stefano Redgie Butts, MD  [facility] Endocrinology  [facility] [address] [place], [place] [address] Phone: [phone] FAX: [phone]   CC: Norleen Lynwood ORN, MD [address] [place] [address] Phone: [phone]  Fax: [phone]  Return to Endocrinology clinic as below: Future Appointments  Date Time Provider Department Center  08/03/2024 10:00 AM [facility]  08/03/2024 11:00 AM [facility]  02/21/2025  9:00 AM Norleen Lynwood ORN, MD [facility]  02/21/2025  9:30 AM [facility]NUAL WELLNESS VISIT [facility]

## 2024-04-07 ENCOUNTER — Other Ambulatory Visit: Payer: Self-pay | Admitting: Internal Medicine

## 2024-04-20 ENCOUNTER — Other Ambulatory Visit: Payer: Self-pay | Admitting: Internal Medicine

## 2024-04-20 DIAGNOSIS — E1142 Type 2 diabetes mellitus with diabetic polyneuropathy: Secondary | ICD-10-CM

## 2024-04-20 MED ORDER — SITAGLIPTIN PHOSPHATE 50 MG PO TABS: 50.0000 mg | ORAL_TABLET | Freq: Every day | ORAL | 3 refills | Status: AC

## 2024-05-18 ENCOUNTER — Telehealth: Payer: Self-pay

## 2024-05-18 NOTE — Telephone Encounter (Signed)
 Copied from CRM #[id number]. Topic: General - Other >> May 18, 2024  2:09 PM Rosina BIRCH wrote: Reason for CRM: Marriane from [facility] called stating the patient was prescribed carbamazepine  and it is a type of drug that they have to let the provider know that the manufacturer is being changed. It is being changed from teva to apotes and they want to know if it is ok [phone]

## 2024-05-18 NOTE — Telephone Encounter (Signed)
 Ok sure, ok to change the manufacturer - thanks

## 2024-05-19 NOTE — Telephone Encounter (Signed)
 Called and gave the verbal okay for the change.

## 2024-07-02 ENCOUNTER — Ambulatory Visit: Admitting: Internal Medicine

## 2024-07-07 ENCOUNTER — Other Ambulatory Visit: Payer: Self-pay

## 2024-07-07 DIAGNOSIS — I6523 Occlusion and stenosis of bilateral carotid arteries: Secondary | ICD-10-CM

## 2024-08-03 ENCOUNTER — Encounter (HOSPITAL_COMMUNITY)

## 2024-08-03 ENCOUNTER — Ambulatory Visit

## 2025-02-21 ENCOUNTER — Ambulatory Visit

## 2025-02-21 ENCOUNTER — Encounter: Admitting: Internal Medicine
